# Patient Record
Sex: Female | Born: 1945 | Race: White | Hispanic: No | Smoking: Never smoker
Health system: Southern US, Community
[De-identification: ages and names within clinical notes are randomized; demographics above are authoritative.]

## PROBLEM LIST (undated history)

## (undated) DIAGNOSIS — G2581 Restless legs syndrome: Secondary | ICD-10-CM

## (undated) DIAGNOSIS — E119 Type 2 diabetes mellitus without complications: Secondary | ICD-10-CM

## (undated) DIAGNOSIS — I639 Cerebral infarction, unspecified: Secondary | ICD-10-CM

## (undated) DIAGNOSIS — R011 Cardiac murmur, unspecified: Secondary | ICD-10-CM

## (undated) DIAGNOSIS — D649 Anemia, unspecified: Secondary | ICD-10-CM

## (undated) DIAGNOSIS — I739 Peripheral vascular disease, unspecified: Secondary | ICD-10-CM

## (undated) DIAGNOSIS — I10 Essential (primary) hypertension: Secondary | ICD-10-CM

## (undated) DIAGNOSIS — IMO0001 Reserved for inherently not codable concepts without codable children: Secondary | ICD-10-CM

## (undated) DIAGNOSIS — F410 Panic disorder [episodic paroxysmal anxiety] without agoraphobia: Secondary | ICD-10-CM

## (undated) DIAGNOSIS — I509 Heart failure, unspecified: Secondary | ICD-10-CM

## (undated) DIAGNOSIS — J45909 Unspecified asthma, uncomplicated: Secondary | ICD-10-CM

## (undated) DIAGNOSIS — K219 Gastro-esophageal reflux disease without esophagitis: Secondary | ICD-10-CM

## (undated) DIAGNOSIS — Z8719 Personal history of other diseases of the digestive system: Secondary | ICD-10-CM

## (undated) HISTORY — DX: Cerebral infarction, unspecified: I63.9

## (undated) HISTORY — PX: FRACTURE SURGERY: SHX138

## (undated) HISTORY — PX: ABDOMINAL HYSTERECTOMY: SHX81

## (undated) HISTORY — PX: BREAST BIOPSY: SHX20

## (undated) HISTORY — PX: EYE SURGERY: SHX253

## (undated) HISTORY — PX: TONSILLECTOMY: SUR1361

## (undated) HISTORY — DX: Heart failure, unspecified: I50.9

---

## 2005-02-25 ENCOUNTER — Ambulatory Visit: Payer: Self-pay | Admitting: Internal Medicine

## 2005-08-10 ENCOUNTER — Ambulatory Visit: Payer: Self-pay

## 2007-04-12 ENCOUNTER — Ambulatory Visit: Payer: Self-pay | Admitting: Internal Medicine

## 2008-02-06 ENCOUNTER — Ambulatory Visit: Payer: Self-pay | Admitting: Internal Medicine

## 2008-08-04 ENCOUNTER — Ambulatory Visit: Payer: Self-pay | Admitting: Internal Medicine

## 2012-02-24 ENCOUNTER — Ambulatory Visit: Payer: Self-pay | Admitting: Internal Medicine

## 2012-05-16 ENCOUNTER — Ambulatory Visit: Payer: Self-pay | Admitting: Internal Medicine

## 2012-07-02 ENCOUNTER — Ambulatory Visit: Payer: Self-pay | Admitting: Internal Medicine

## 2012-10-16 ENCOUNTER — Ambulatory Visit: Payer: Self-pay

## 2012-10-24 ENCOUNTER — Ambulatory Visit: Payer: Self-pay | Admitting: Ophthalmology

## 2012-10-24 DIAGNOSIS — I10 Essential (primary) hypertension: Secondary | ICD-10-CM

## 2012-11-12 ENCOUNTER — Ambulatory Visit: Payer: Self-pay | Admitting: Ophthalmology

## 2013-08-22 ENCOUNTER — Emergency Department: Payer: Self-pay | Admitting: Internal Medicine

## 2013-08-29 ENCOUNTER — Ambulatory Visit: Payer: Self-pay | Admitting: Family Medicine

## 2013-10-28 ENCOUNTER — Ambulatory Visit: Payer: Self-pay | Admitting: Internal Medicine

## 2013-12-19 LAB — BASIC METABOLIC PANEL
Anion Gap: 9 (ref 7–16)
BUN: 9 mg/dL (ref 7–18)
CHLORIDE: 101 mmol/L (ref 98–107)
Calcium, Total: 8.6 mg/dL (ref 8.5–10.1)
Co2: 29 mmol/L (ref 21–32)
Creatinine: 0.73 mg/dL (ref 0.60–1.30)
Glucose: 198 mg/dL — ABNORMAL HIGH (ref 65–99)
Potassium: 4.1 mmol/L (ref 3.5–5.1)
Sodium: 139 mmol/L (ref 136–145)

## 2013-12-19 LAB — CBC
HCT: 40.6 % (ref 35.0–47.0)
HGB: 12.5 g/dL (ref 12.0–16.0)
MCH: 26.2 pg (ref 26.0–34.0)
MCHC: 30.9 g/dL — AB (ref 32.0–36.0)
MCV: 85 fL (ref 80–100)
PLATELETS: 205 10*3/uL (ref 150–440)
RBC: 4.78 10*6/uL (ref 3.80–5.20)
RDW: 15.4 % — AB (ref 11.5–14.5)
WBC: 9.9 10*3/uL (ref 3.6–11.0)

## 2013-12-19 LAB — TROPONIN I: Troponin-I: 0.04 ng/mL

## 2013-12-20 ENCOUNTER — Observation Stay: Payer: Self-pay | Admitting: Internal Medicine

## 2013-12-20 LAB — TROPONIN I
Troponin-I: 0.04 ng/mL
Troponin-I: 0.04 ng/mL

## 2013-12-20 LAB — LIPID PANEL
Cholesterol: 127 mg/dL (ref 0–200)
HDL Cholesterol: 48 mg/dL (ref 40–60)
Ldl Cholesterol, Calc: 59 mg/dL (ref 0–100)
Triglycerides: 101 mg/dL (ref 0–200)
VLDL Cholesterol, Calc: 20 mg/dL (ref 5–40)

## 2013-12-20 LAB — HEMOGLOBIN A1C: Hemoglobin A1C: 8.1 % — ABNORMAL HIGH (ref 4.2–6.3)

## 2013-12-20 LAB — CK-MB
CK-MB: 1.2 ng/mL (ref 0.5–3.6)
CK-MB: 1.2 ng/mL (ref 0.5–3.6)
CK-MB: 1.2 ng/mL (ref 0.5–3.6)

## 2013-12-20 LAB — TSH: Thyroid Stimulating Horm: 2.22 u[IU]/mL

## 2013-12-24 LAB — CULTURE, BLOOD (SINGLE)

## 2013-12-25 LAB — CULTURE, BLOOD (SINGLE)

## 2014-01-30 ENCOUNTER — Ambulatory Visit: Payer: Self-pay | Admitting: Vascular Surgery

## 2014-03-03 ENCOUNTER — Ambulatory Visit: Payer: Self-pay | Admitting: Vascular Surgery

## 2014-06-20 NOTE — Op Note (Signed)
PATIENT NAME:  [name], [name] MR#:  [id number] DATE OF BIRTH:  November 05, 1945  DATE OF PROCEDURE:  11/12/2012  PREOPERATIVE DIAGNOSIS: Cataract, left eye.   POSTOPERATIVE DIAGNOSIS: Cataract, left eye.   PROCEDURE PERFORMED: Extracapsular cataract extraction using phacoemulsification with placement of Alcon [id number], 17.5-diopter posterior chamber lens, serial number [id number].   SURGEON: Maylon Peppers. Tanisa Lagace, M.D.   ANESTHESIA: 4% lidocaine and 0.75% Marcaine in a 50-50 mixture with 10 units/mL of Hylenex added, given as a peribulbar.   ANESTHESIOLOGIST: Naomie Dean, MD  COMPLICATIONS: None.   ESTIMATED BLOOD LOSS: Less than 1 mL.   DESCRIPTION OF PROCEDURE:  The patient was brought to the operating room and given a peribulbar block.  The patient was then prepped and draped in the usual fashion.  The vertical rectus muscles were imbricated using 5-0 silk sutures.  These sutures were then clamped to the sterile drapes as bridle sutures.  A limbal peritomy was performed extending two clock hours and hemostasis was obtained with cautery.  A partial thickness scleral groove was made at the surgical limbus and dissected anteriorly in a lamellar dissection using an Alcon crescent knife.  The anterior chamber was entered supero-temporally with a Superblade and through the lamellar dissection with a 2.6 mm keratome.  DisCoVisc was used to replace the aqueous and a continuous tear capsulorrhexis was carried out.  Hydrodissection and hydrodelineation were carried out with balanced salt and a 27 gauge canula.  The nucleus was rotated to confirm the effectiveness of the hydrodissection.  Phacoemulsification was carried out using a divide-and-conquer technique.  Total ultrasound time was 1 minute and 43 seconds with an average power of 25.7%, CDE 47.84.   Irrigation/aspiration was used to remove the residual cortex.  DisCoVisc was used to inflate the capsule and the internal incision was enlarged  to 3 mm with the crescent knife.  The intraocular lens was folded and inserted into the capsular bag using the AcrySert delivery system.  Irrigation/aspiration was used to remove the residual DisCoVisc.  Miostat was injected into the anterior chamber through the paracentesis track to inflate the anterior chamber and induce miosis.  0.1 mL of cefuroxime was injected via the paracentesis track containing 1 mg of medication. The wound was checked for leaks and none were found. The conjunctiva was closed with cautery and the bridle sutures were removed.  Two drops of 0.3% Vigamox were placed on the eye.   An eye shield was placed on the eye.  The patient was discharged to the recovery room in good condition.   ____________________________ Maylon Peppers Jensen Kilburg, MD sad:jm D: 11/12/2012 13:13:04 ET T: 11/12/2012 13:25:01 ET JOB#: [id number]  cc: Viviann Spare A. Vickey Boak, MD, <Dictator> Erline Levine MD ELECTRONICALLY SIGNED 11/19/2012 13:30

## 2014-06-21 NOTE — Consult Note (Signed)
PATIENT NAME:  [name], [name] MR#:  [id number] DATE OF BIRTH:  [birth year]  DATE OF CONSULTATION:  12/20/2013  REFERRING PHYSICIAN: Kelton Pillar. Sheryle Hail, MD    CONSULTING PHYSICIAN:  Lamar Blinks, MD  REASON FOR CONSULTATION: Chest pain with angina, essential hypertension, mixed hyperlipidemia, diabetes without complication.   CHIEF COMPLAINT: "I have shortness of breath."  HISTORY OF PRESENT ILLNESS: This is a 69 year old female with known diabetes without complication, essential hypertension and mixed hyperlipidemia, on appropriate medication management, who has had progressive shortness of breath over the last 3 months, worsening in the last several weeks and also associated with chest pain, substernal in nature, radiating into the back and associated with weakness and fatigue. The patient does have some lower extremity edema as well. The patient does have an EKG showing normal sinus rhythm with normal troponin without evidence of myocardial infarction but has other concerns, though currently she has been appropriately using medications given from the Emergency Room without further evidence of chest discomfort.   REVIEW OF SYSTEMS: The remainder of review of systems is negative for vision change, ringing in the ears, hearing loss, cough, congestion, heartburn, nausea, vomiting, diarrhea, bloody stool, stomach pain, extremity pain, leg weakness, cramping of the buttocks, known blood clots, headaches, blackouts, dizzy spells, nosebleeds, congestion, trouble swallowing, frequent urination, urination at night, muscle weakness, numbness, anxiety, depression, skin lesions, skin rashes.   PAST MEDICAL HISTORY:  1. Essential hypertension.  2. Diabetes without complication. 3. Mixed hyperlipidemia.   FAMILY HISTORY: No family members with early onset of cardiovascular disease, hypertension.   SOCIAL HISTORY: The patient has no current evidence of tobacco use or alcohol use.  ALLERGIES: She has  no known drug allergies.   CURRENT MEDICATIONS: As listed.   PHYSICAL EXAMINATION:  VITAL SIGNS: Blood pressure is 136/62 bilaterally. Heart rate is 72 upright, reclining, and regular.  GENERAL: She is a well-appearing female in no acute distress.  HEENT: No icterus, thyromegaly, ulcers, hemorrhage, or xanthelasma.  CARDIOVASCULAR: Regular rate and rhythm. Normal S1 and S2. Distant heart sounds. Cannot assess murmur, gallop, or rub. PMI is diffuse. Carotid upstroke normal without bruit. Jugular venous pressure is normal.  LUNGS: Clear to auscultation with normal respirations.  ABDOMEN: Soft, nontender. Cannot assess hepatosplenomegaly due to increased abdominal girth.  EXTREMITIES: Show 2+ radial, no dorsal pedal or femoral artery pulses with 1+ lower extremity edema and some redness and rash.  NEUROLOGIC: She is oriented to time, place, and person with normal mood and affect.   ASSESSMENT: A 69 year old female with essential hypertension, diabetes without complication, mixed hyperlipidemia with unstable angina and no current evidence of myocardial infarction.   RECOMMENDATIONS:  1. Continue serial ECG and enzymes to assess for possible myocardial infarction.  2. Continue treatment of essential hypertension with ACE inhibitor and consider beta blocker as well.  3. Mixed hyperlipidemia treatment with high intensity cholesterol therapy due to diabetes and high risk score.  4. Diabetes medication management for goal hemoglobin A1c. 5. Echocardiogram for LV systolic dysfunction causing shortness of breath and/or chest discomfort..  6. Further intervention including stress test for myocardial perfusion imaging and causes for unstable angina.    ____________________________ Lamar Blinks, MD bjk:jh D: 12/20/2013 08:25:30 ET T: 12/20/2013 09:47:39 ET JOB#: [id number]  cc: Lamar Blinks, MD, <Dictator> Lamar Blinks MD ELECTRONICALLY SIGNED 12/24/2013 8:41

## 2014-06-21 NOTE — H&P (Signed)
PATIENT NAME:  [name], [name] MR#:  [id number] DATE OF BIRTH:  August 31, 1945  DATE OF ADMISSION:  12/20/2013  REFERRING PHYSICIAN:  Bobetta Lime A. Inocencio Homes, MD   PRIMARY CARE PHYSICIAN:  John B. Danne Harbor, MD  ADMISSION DIAGNOSIS:  Chest pain.   HISTORY OF PRESENT ILLNESS:  This is a 69 year old Caucasian female who presents to the Emergency Department complaining of chest pain with exertion. The patient states that every time she walks, no matter the distance or incline or pace, she feels a dull pressure-like sensation just left of center of her chest. The pain does not radiate. She never feels the pain at rest. She occasionally feels nausea with the chest pain but always seems to feel hot and becomes slightly diaphoretic. She admits to shortness of breath with her chest pain as well as lightheadedness. Due to her risk factors and symptomatology, the Emergency Department called for admission.   REVIEW OF SYSTEMS:  CONSTITUTIONAL:  The patient denies fever or weakness.  EYES:  Denies inflammation or blurred vision.  EARS, NOSE, AND THROAT:  Denies tinnitus or difficulty swallowing.  RESPIRATORY:  Admits to shortness of breath associated with chest pain but denies cough.  CARDIOVASCULAR:  Admits to chest pain, as well as orthopnea. She denies palpitations or paroxysmal nocturnal dyspnea.  GASTROINTESTINAL:  Admits to some nausea associated with chest pain but denies vomiting or diarrhea.  GENITOURINARY:  The patient denies dysuria or increased frequency or hesitancy.  ENDOCRINE:  Denies polyuria or nocturia.  HEMATOLOGIC AND LYMPHATIC:  Denies easy bruising or bleeding.  INTEGUMENT:  Admits to rash but denies lesions.  MUSCULOSKELETAL:  Denies myalgias but admits to hip pain.  NEUROLOGIC:  Denies numbness in her extremities or dysarthria.  PSYCHIATRIC:  Denies depression or suicidal ideation.   PAST MEDICAL HISTORY:  Hypertension, diabetes type 2, hyperlipidemia, gastroesophageal reflux disease,  hiatal hernia, as well as a history of thyroiditis and thyroid nodules.   PAST SURGICAL HISTORY:  The patient had percutaneous intervention but did not receive stents more than 10 years ago. She has also had a hysterectomy.   FAMILY HISTORY:  Heart disease in her father and brother, both of whom are deceased of this condition.   SOCIAL HISTORY:  The patient does not drink or do any drugs, but she admits to being exposed to secondhand smoke for the last 45 years.   MEDICATIONS: 1.  Glimepiride 2 mg 1 tab p.o. daily.  2.  Levemir 45 units subcutaneously 2 times a day.  3.  Lovastatin 20 mg 1 tab p.o. daily.  4.  Metformin 1000 mg 1 tab p.o. b.i.d.  5.  Omeprazole 20 mg extended-release capsule 1 cap p.o. daily.  6.  Trazodone 100 mg 1 tab p.o. daily.   ALLERGIES:  CODEINE AND WHEAT.   PERTINENT LABORATORY RESULTS AND RADIOGRAPHIC FINDINGS:  Serum glucose is 198, BUN 9, creatinine 0.73, sodium 139, potassium 4.1, chloride 101, bicarbonate 29, and calcium 8.6. Troponin is 0.04. White blood cell count is 9.9, hemoglobin 12.5, hematocrit 40.6, MCV 85.   Chest x-ray shows shallow inspiration with mild cardiac enlargement, but no evidence of active pulmonary disease.   PHYSICAL EXAMINATION: VITAL SIGNS:  Temperature is 97.4, pulse 90, respirations 20, blood pressure 146/78, pulse oximetry 93% on 2 liters of oxygen via nasal cannula.  GENERAL:  The patient is alert and oriented x 3 and in no apparent distress.  HEENT:  Normocephalic. Pupils are equal, round, and reactive to light and accommodation. Extraocular movements are  intact. Mucous membranes are moist. The patient points to a depression on the crown of her scalp that she suffered following a fall a number of weeks ago.  NECK:  Trachea is midline. No adenopathy.  CHEST:  Symmetric and atraumatic.  CARDIOVASCULAR:  Regular rate and rhythm. Normal S1, S2. No rubs, clicks, or murmurs. The patient does have a carotid bruit on the left.  LUNGS:   Clear to auscultation bilaterally. Normal effort and excursion.  ABDOMEN:  Positive bowel sounds. Soft, nontender, nondistended. No hepatosplenomegaly.  GENITOURINARY:  Deferred.  MUSCULOSKELETAL:  The patient moves all 4 extremities equally. Strength is 5/5 in upper and lower extremities bilaterally.  SKIN:  The patient has multiple areas of excoriations on her lower extremities. She has been diagnosed with scabies recently.  EXTREMITIES:  No clubbing or cyanosis. The patient does have some nonpitting and nontense edema up to her mid shin bilaterally.  NEUROLOGIC:  Cranial nerves II through XII are grossly intact.  PSYCHIATRIC:  Mood is normal. Affect is congruent.   ASSESSMENT AND PLAN:  This is a 69 year old female admitted for chest pain.   1.  Pain is exertional and associated with shortness of breath. It has been persisting over weeks, which certainly indicates that she has chronic stable angina. Thankfully, she does not have any acute EKG changes. Her first troponin is negative. She was given therapeutic Lovenox in the Emergency Department, but we will continue at this time with prophylactic subcutaneous heparin. We will continue to cycle the patient's cardiac biomarkers. I have consulted cardiology for the morning.  2.  Hypertension is uncontrolled at this time. It does not appear the patient is on antihypertensive medicine. I will initiate an ACE inhibitor at this time, as her kidney function is normal and she does not have a listed allergy to this class of medicine.  3.  Diabetes type 2. We will continue the patient's basal insulin. I have decreased the dose some given that it is dosed twice daily at home. I have held her glimepiride and metformin in the event that she has a procedure that requires contrast dye.  4.  Hyperlipidemia. We will continue the patient's statin per her home regimen.  5.  Obesity. The patient's BMI is 49. I have encouraged a balanced diet and portion control.  6.   Thyroiditis, history of thyroid nodules, and the patient reports that she had been on hormone replacement at one time in the past. We will check a thyroid-stimulating hormone with her labs.  7.  Scabies. The patient has permethrin cream. We will apply it tonight and rinse it in the morning. Due to the multiple areas of excoriation, the Emergency Department obtained blood cultures and gave 1 dose of clindamycin. It does not appear that she has super infection or impetigo of the wounds at this time. She also has no signs or symptoms of sepsis.  8.  Gastroesophageal reflux disease secondary to hiatal hernia. We will continue the patient's proton pump inhibitor.  9.  Deep vein thrombosis prophylaxis with heparin.  10.  Gastrointestinal prophylaxis with pantoprazole.   CODE STATUS:  The patient is a full code.   TIME SPENT ON ADMISSION ORDERS AND PATIENT CARE:  Approximately 45 minutes.    ____________________________ Kelton Pillar. Sheryle Hail, MD msd:nb D: 12/20/2013 02:00:57 ET T: 12/20/2013 02:49:21 ET JOB#: [id number]  cc: Kelton Pillar. Sheryle Hail, MD, <Dictator> Kelton Pillar Cheralyn Oliver MD ELECTRONICALLY SIGNED 12/20/2013 7:40

## 2014-06-25 ENCOUNTER — Other Ambulatory Visit: Payer: Self-pay | Admitting: Internal Medicine

## 2014-06-25 DIAGNOSIS — Z1231 Encounter for screening mammogram for malignant neoplasm of breast: Secondary | ICD-10-CM

## 2014-07-17 ENCOUNTER — Ambulatory Visit
Admission: RE | Admit: 2014-07-17 | Discharge: 2014-07-17 | Disposition: A | Discharge status: Home / Self Care | Payer: Medicare Other | Source: Ambulatory Visit | Attending: Vascular Surgery | Admitting: Vascular Surgery

## 2014-07-17 ENCOUNTER — Encounter
Admission: RE | Admit: 2014-07-17 | Discharge: 2014-07-17 | Disposition: A | Discharge status: Home / Self Care | Payer: Medicare Other | Source: Ambulatory Visit | Attending: Vascular Surgery | Admitting: Vascular Surgery

## 2014-07-17 DIAGNOSIS — R0600 Dyspnea, unspecified: Secondary | ICD-10-CM | POA: Diagnosis not present

## 2014-07-17 DIAGNOSIS — Z01818 Encounter for other preprocedural examination: Secondary | ICD-10-CM | POA: Insufficient documentation

## 2014-07-17 DIAGNOSIS — I10 Essential (primary) hypertension: Secondary | ICD-10-CM | POA: Insufficient documentation

## 2014-07-17 DIAGNOSIS — Z01812 Encounter for preprocedural laboratory examination: Secondary | ICD-10-CM | POA: Insufficient documentation

## 2014-07-17 DIAGNOSIS — Z01810 Encounter for preprocedural cardiovascular examination: Secondary | ICD-10-CM | POA: Insufficient documentation

## 2014-07-17 HISTORY — DX: Reserved for inherently not codable concepts without codable children: IMO0001

## 2014-07-17 HISTORY — DX: Cardiac murmur, unspecified: R01.1

## 2014-07-17 HISTORY — DX: Personal history of other diseases of the digestive system: Z87.19

## 2014-07-17 HISTORY — DX: Unspecified asthma, uncomplicated: J45.909

## 2014-07-17 HISTORY — DX: Restless legs syndrome: G25.81

## 2014-07-17 HISTORY — DX: Type 2 diabetes mellitus without complications: E11.9

## 2014-07-17 HISTORY — DX: Gastro-esophageal reflux disease without esophagitis: K21.9

## 2014-07-17 HISTORY — DX: Peripheral vascular disease, unspecified: I73.9

## 2014-07-17 HISTORY — DX: Panic disorder (episodic paroxysmal anxiety): F41.0

## 2014-07-17 HISTORY — DX: Essential (primary) hypertension: I10

## 2014-07-17 LAB — CBC WITH DIFFERENTIAL/PLATELET
BASOS PCT: 1 %
Basophils Absolute: 0.1 10*3/uL (ref 0–0.1)
Eosinophils Absolute: 0.3 10*3/uL (ref 0–0.7)
Eosinophils Relative: 3 %
HCT: 39.8 % (ref 35.0–47.0)
Hemoglobin: 12.4 g/dL (ref 12.0–16.0)
LYMPHS ABS: 1.9 10*3/uL (ref 1.0–3.6)
Lymphocytes Relative: 18 %
MCH: 25.6 pg — ABNORMAL LOW (ref 26.0–34.0)
MCHC: 31.2 g/dL — AB (ref 32.0–36.0)
MCV: 82 fL (ref 80.0–100.0)
MONOS PCT: 7 %
Monocytes Absolute: 0.8 10*3/uL (ref 0.2–0.9)
Neutro Abs: 7.4 10*3/uL — ABNORMAL HIGH (ref 1.4–6.5)
Neutrophils Relative %: 71 %
Platelets: 214 10*3/uL (ref 150–440)
RBC: 4.85 MIL/uL (ref 3.80–5.20)
RDW: 16.9 % — ABNORMAL HIGH (ref 11.5–14.5)
WBC: 10.5 10*3/uL (ref 3.6–11.0)

## 2014-07-17 LAB — URINALYSIS COMPLETE WITH MICROSCOPIC (ARMC ONLY)
BACTERIA UA: NONE SEEN
BILIRUBIN URINE: NEGATIVE
GLUCOSE, UA: NEGATIVE mg/dL
Ketones, ur: NEGATIVE mg/dL
Nitrite: NEGATIVE
Protein, ur: 30 mg/dL — AB
SPECIFIC GRAVITY, URINE: 1.012 (ref 1.005–1.030)
pH: 6 (ref 5.0–8.0)

## 2014-07-17 LAB — BASIC METABOLIC PANEL
Anion gap: 11 (ref 5–15)
BUN: 8 mg/dL (ref 6–20)
CALCIUM: 8.7 mg/dL — AB (ref 8.9–10.3)
CHLORIDE: 98 mmol/L — AB (ref 101–111)
CO2: 29 mmol/L (ref 22–32)
Creatinine, Ser: 0.7 mg/dL (ref 0.44–1.00)
GFR calc Af Amer: >60 mL/min (ref >60)
GFR calc non Af Amer: >60 mL/min (ref >60)
Glucose, Bld: 236 mg/dL — ABNORMAL HIGH (ref 65–99)
Potassium: 3.8 mmol/L (ref 3.5–5.1)
Sodium: 138 mmol/L (ref 135–145)

## 2014-07-17 LAB — TYPE AND SCREEN
ABO/RH(D): O POS
Antibody Screen: NEGATIVE

## 2014-07-17 LAB — PROTIME-INR
INR: 1.03
Prothrombin Time: 13.7 seconds (ref 11.4–15.0)

## 2014-07-17 LAB — APTT: APTT: 31 s (ref 24–36)

## 2014-07-18 LAB — ABO/RH: ABO/RH(D): O POS

## 2014-07-24 ENCOUNTER — Inpatient Hospital Stay
Admission: RE | Admit: 2014-07-24 | Discharge: 2014-07-25 | DRG: 038 | Disposition: A | Discharge status: Home / Self Care | Payer: Medicare Other | Source: Ambulatory Visit | Attending: Vascular Surgery | Admitting: Vascular Surgery

## 2014-07-24 ENCOUNTER — Inpatient Hospital Stay: Payer: Medicare Other | Admitting: Anesthesiology

## 2014-07-24 ENCOUNTER — Encounter: Payer: Self-pay | Admitting: *Deleted

## 2014-07-24 ENCOUNTER — Encounter
Admission: RE | Disposition: A | Discharge status: Home / Self Care | Payer: Self-pay | Source: Ambulatory Visit | Attending: Vascular Surgery

## 2014-07-24 DIAGNOSIS — G2581 Restless legs syndrome: Secondary | ICD-10-CM | POA: Diagnosis present

## 2014-07-24 DIAGNOSIS — K219 Gastro-esophageal reflux disease without esophagitis: Secondary | ICD-10-CM | POA: Diagnosis present

## 2014-07-24 DIAGNOSIS — E1140 Type 2 diabetes mellitus with diabetic neuropathy, unspecified: Secondary | ICD-10-CM | POA: Diagnosis present

## 2014-07-24 DIAGNOSIS — I10 Essential (primary) hypertension: Secondary | ICD-10-CM | POA: Diagnosis present

## 2014-07-24 DIAGNOSIS — Z6841 Body Mass Index (BMI) 40.0 and over, adult: Secondary | ICD-10-CM | POA: Diagnosis not present

## 2014-07-24 DIAGNOSIS — I6521 Occlusion and stenosis of right carotid artery: Principal | ICD-10-CM | POA: Diagnosis present

## 2014-07-24 DIAGNOSIS — J45909 Unspecified asthma, uncomplicated: Secondary | ICD-10-CM | POA: Diagnosis present

## 2014-07-24 DIAGNOSIS — Z7982 Long term (current) use of aspirin: Secondary | ICD-10-CM | POA: Diagnosis not present

## 2014-07-24 DIAGNOSIS — F410 Panic disorder [episodic paroxysmal anxiety] without agoraphobia: Secondary | ICD-10-CM | POA: Diagnosis present

## 2014-07-24 DIAGNOSIS — I739 Peripheral vascular disease, unspecified: Secondary | ICD-10-CM | POA: Diagnosis present

## 2014-07-24 DIAGNOSIS — Z794 Long term (current) use of insulin: Secondary | ICD-10-CM

## 2014-07-24 DIAGNOSIS — Z79891 Long term (current) use of opiate analgesic: Secondary | ICD-10-CM | POA: Diagnosis not present

## 2014-07-24 DIAGNOSIS — E780 Pure hypercholesterolemia: Secondary | ICD-10-CM | POA: Diagnosis present

## 2014-07-24 DIAGNOSIS — I6529 Occlusion and stenosis of unspecified carotid artery: Secondary | ICD-10-CM | POA: Diagnosis present

## 2014-07-24 HISTORY — PX: ENDARTERECTOMY: SHX5162

## 2014-07-24 LAB — GLUCOSE, CAPILLARY
GLUCOSE-CAPILLARY: 222 mg/dL — AB (ref 65–99)
GLUCOSE-CAPILLARY: 202 mg/dL — AB (ref 65–99)
GLUCOSE-CAPILLARY: 229 mg/dL — AB (ref 65–99)
Glucose-Capillary: 220 mg/dL — ABNORMAL HIGH (ref 65–99)

## 2014-07-24 SURGERY — ENDARTERECTOMY, CAROTID
Anesthesia: General | Laterality: Right | Wound class: Clean

## 2014-07-24 MED ORDER — OXYCODONE-ACETAMINOPHEN 5-325 MG PO TABS: 1.0000 | ORAL_TABLET | ORAL | Status: DC | PRN

## 2014-07-24 MED ORDER — FAMOTIDINE IN NACL 20-0.9 MG/50ML-% IV SOLN
20.0000 mg | Freq: Two times a day (BID) | INTRAVENOUS | Status: DC
  Administered 2014-07-24: 20 mg via INTRAVENOUS
  Filled 2014-07-24 (×4): qty 50

## 2014-07-24 MED ORDER — PROPOFOL 10 MG/ML IV BOLUS
INTRAVENOUS | Status: DC | PRN
  Administered 2014-07-24: 140 mg via INTRAVENOUS

## 2014-07-24 MED ORDER — NITROGLYCERIN IN D5W 200-5 MCG/ML-% IV SOLN
INTRAVENOUS | Status: AC
  Filled 2014-07-24: qty 250

## 2014-07-24 MED ORDER — SODIUM CHLORIDE 0.9 % IV SOLN
INTRAVENOUS | Status: DC | PRN
  Administered 2014-07-24: 50 mL via INTRAMUSCULAR

## 2014-07-24 MED ORDER — LABETALOL HCL 5 MG/ML IV SOLN: 10.0000 mg | INTRAVENOUS | Status: DC | PRN

## 2014-07-24 MED ORDER — LABETALOL HCL 5 MG/ML IV SOLN
INTRAVENOUS | Status: DC | PRN
  Administered 2014-07-24: 2.5 mg via INTRAVENOUS
  Administered 2014-07-24: 5 mg via INTRAVENOUS
  Administered 2014-07-24: 2.5 mg via INTRAVENOUS

## 2014-07-24 MED ORDER — CEFAZOLIN SODIUM-DEXTROSE 2-3 GM-% IV SOLR
INTRAVENOUS | Status: AC
  Filled 2014-07-24: qty 50

## 2014-07-24 MED ORDER — DOCUSATE SODIUM 100 MG PO CAPS
100.0000 mg | ORAL_CAPSULE | Freq: Every day | ORAL | Status: DC
  Administered 2014-07-25: 100 mg via ORAL
  Filled 2014-07-24: qty 1

## 2014-07-24 MED ORDER — NEOSTIGMINE METHYLSULFATE 10 MG/10ML IV SOLN
INTRAVENOUS | Status: DC | PRN
  Administered 2014-07-24: 4 mg via INTRAVENOUS

## 2014-07-24 MED ORDER — GLYCOPYRROLATE 0.2 MG/ML IJ SOLN
INTRAMUSCULAR | Status: DC | PRN
  Administered 2014-07-24: 0.6 mg via INTRAVENOUS

## 2014-07-24 MED ORDER — MORPHINE SULFATE 2 MG/ML IJ SOLN: 2.0000 mg | INTRAMUSCULAR | Status: DC | PRN

## 2014-07-24 MED ORDER — DEXTROSE 5 % IV SOLN
10.0000 mg | INTRAVENOUS | Status: DC | PRN
  Administered 2014-07-24: 50 ug/min via INTRAVENOUS

## 2014-07-24 MED ORDER — ACETAMINOPHEN 325 MG PO TABS
325.0000 mg | ORAL_TABLET | ORAL | Status: DC | PRN
  Administered 2014-07-24 – 2014-07-25 (×2): 325 mg via ORAL
  Filled 2014-07-24 (×2): qty 1

## 2014-07-24 MED ORDER — EVICEL 2 ML EX KIT
PACK | CUTANEOUS | Status: DC | PRN
  Administered 2014-07-24: 1

## 2014-07-24 MED ORDER — LIDOCAINE HCL (PF) 1 % IJ SOLN
INTRAMUSCULAR | Status: AC
  Filled 2014-07-24: qty 30

## 2014-07-24 MED ORDER — CEFAZOLIN SODIUM 1 G IJ SOLR
INTRAMUSCULAR | Status: AC
  Filled 2014-07-24: qty 10

## 2014-07-24 MED ORDER — HYDRALAZINE HCL 20 MG/ML IJ SOLN: 5.0000 mg | INTRAMUSCULAR | Status: DC | PRN

## 2014-07-24 MED ORDER — HEPARIN SODIUM (PORCINE) 1000 UNIT/ML IJ SOLN
INTRAMUSCULAR | Status: DC | PRN
  Administered 2014-07-24: 6 mL via INTRAVENOUS

## 2014-07-24 MED ORDER — ONDANSETRON HCL 4 MG/2ML IJ SOLN: 4.0000 mg | Freq: Four times a day (QID) | INTRAMUSCULAR | Status: DC | PRN

## 2014-07-24 MED ORDER — ROCURONIUM BROMIDE 100 MG/10ML IV SOLN
INTRAVENOUS | Status: DC | PRN
  Administered 2014-07-24: 20 mg via INTRAVENOUS
  Administered 2014-07-24: 30 mg via INTRAVENOUS
  Administered 2014-07-24: 20 mg via INTRAVENOUS

## 2014-07-24 MED ORDER — FENTANYL CITRATE (PF) 100 MCG/2ML IJ SOLN: 25.0000 ug | INTRAMUSCULAR | Status: DC | PRN

## 2014-07-24 MED ORDER — ACETAMINOPHEN 650 MG RE SUPP: 325.0000 mg | RECTAL | Status: DC | PRN

## 2014-07-24 MED ORDER — CEFAZOLIN SODIUM-DEXTROSE 2-3 GM-% IV SOLR
2.0000 g | Freq: Once | INTRAVENOUS | Status: AC
  Administered 2014-07-24: 2 g via INTRAVENOUS

## 2014-07-24 MED ORDER — ONDANSETRON HCL 4 MG/2ML IJ SOLN: 4.0000 mg | Freq: Once | INTRAMUSCULAR | Status: DC | PRN

## 2014-07-24 MED ORDER — HEPARIN SODIUM (PORCINE) 1000 UNIT/ML IJ SOLN
INTRAMUSCULAR | Status: AC
  Filled 2014-07-24: qty 1

## 2014-07-24 MED ORDER — MAGNESIUM SULFATE 2 GM/50ML IV SOLN
2.0000 g | Freq: Every day | INTRAVENOUS | Status: DC | PRN
  Filled 2014-07-24: qty 50

## 2014-07-24 MED ORDER — LIDOCAINE HCL 1 % IJ SOLN
INTRAMUSCULAR | Status: DC | PRN
  Administered 2014-07-24: 10 mL

## 2014-07-24 MED ORDER — GUAIFENESIN-DM 100-10 MG/5ML PO SYRP: 15.0000 mL | ORAL_SOLUTION | ORAL | Status: DC | PRN

## 2014-07-24 MED ORDER — FENTANYL CITRATE (PF) 100 MCG/2ML IJ SOLN
25.0000 ug | INTRAMUSCULAR | Status: DC | PRN
  Administered 2014-07-24: 25 ug via INTRAVENOUS

## 2014-07-24 MED ORDER — SODIUM CHLORIDE 0.9 % IV SOLN: 500.0000 mL | Freq: Once | INTRAVENOUS | Status: AC | PRN

## 2014-07-24 MED ORDER — ALUM & MAG HYDROXIDE-SIMETH 200-200-20 MG/5ML PO SUSP: 15.0000 mL | ORAL | Status: DC | PRN

## 2014-07-24 MED ORDER — SODIUM CHLORIDE 0.9 % IV SOLN
INTRAVENOUS | Status: DC
  Administered 2014-07-24 (×2): via INTRAVENOUS

## 2014-07-24 MED ORDER — ESMOLOL HCL-SODIUM CHLORIDE 2500 MG/250ML IV SOLN
25.0000 ug/kg/min | INTRAVENOUS | Status: DC
  Filled 2014-07-24: qty 250
  Filled 2014-07-24: qty 100

## 2014-07-24 MED ORDER — CLOPIDOGREL BISULFATE 75 MG PO TABS
75.0000 mg | ORAL_TABLET | Freq: Every day | ORAL | Status: DC
  Administered 2014-07-25: 75 mg via ORAL
  Filled 2014-07-24: qty 1

## 2014-07-24 MED ORDER — SODIUM CHLORIDE 0.9 % IV SOLN
INTRAVENOUS | Status: DC | PRN
  Administered 2014-07-24: 180 mL

## 2014-07-24 MED ORDER — ASPIRIN EC 81 MG PO TBEC
81.0000 mg | DELAYED_RELEASE_TABLET | Freq: Every day | ORAL | Status: DC
  Administered 2014-07-24 – 2014-07-25 (×2): 81 mg via ORAL
  Filled 2014-07-24 (×2): qty 1

## 2014-07-24 MED ORDER — MIDAZOLAM HCL 2 MG/2ML IJ SOLN
INTRAMUSCULAR | Status: DC | PRN
  Administered 2014-07-24: 2 mg via INTRAVENOUS

## 2014-07-24 MED ORDER — DEXTROSE 5 % IV SOLN
1.5000 g | Freq: Two times a day (BID) | INTRAVENOUS | Status: AC
  Administered 2014-07-24 – 2014-07-25 (×2): 1.5 g via INTRAVENOUS
  Filled 2014-07-24 (×2): qty 1.5

## 2014-07-24 MED ORDER — ONDANSETRON HCL 4 MG/2ML IJ SOLN
INTRAMUSCULAR | Status: DC | PRN
  Administered 2014-07-24: 4 mg via INTRAVENOUS

## 2014-07-24 MED ORDER — METOPROLOL TARTRATE 1 MG/ML IV SOLN: 2.0000 mg | INTRAVENOUS | Status: DC | PRN

## 2014-07-24 MED ORDER — SUGAMMADEX SODIUM 200 MG/2ML IV SOLN
INTRAVENOUS | Status: DC | PRN
  Administered 2014-07-24: 200 mg via INTRAVENOUS

## 2014-07-24 MED ORDER — SODIUM CHLORIDE 0.9 % IV SOLN
INTRAVENOUS | Status: DC
  Administered 2014-07-24: 11:00:00 via INTRAVENOUS

## 2014-07-24 MED ORDER — PHENOL 1.4 % MT LIQD
1.0000 | OROMUCOSAL | Status: DC | PRN
  Filled 2014-07-24: qty 177

## 2014-07-24 MED ORDER — FENTANYL CITRATE (PF) 100 MCG/2ML IJ SOLN
INTRAMUSCULAR | Status: DC | PRN
  Administered 2014-07-24 (×2): 100 ug via INTRAVENOUS
  Administered 2014-07-24: 50 ug via INTRAVENOUS

## 2014-07-24 MED ORDER — POTASSIUM CHLORIDE CRYS ER 20 MEQ PO TBCR
20.0000 meq | EXTENDED_RELEASE_TABLET | Freq: Every day | ORAL | Status: DC | PRN
Start: 2014-07-24 — End: 2014-07-25

## 2014-07-24 MED ORDER — DOPAMINE-DEXTROSE 3.2-5 MG/ML-% IV SOLN: 3.0000 ug/kg/min | INTRAVENOUS | Status: DC

## 2014-07-24 MED ORDER — NITROGLYCERIN IN D5W 200-5 MCG/ML-% IV SOLN: 5.0000 ug/min | INTRAVENOUS | Status: DC

## 2014-07-24 MED ORDER — FENTANYL CITRATE (PF) 100 MCG/2ML IJ SOLN
INTRAMUSCULAR | Status: AC
  Filled 2014-07-24: qty 2

## 2014-07-24 SURGICAL SUPPLY — 58 items
BAG DECANTER STRL (MISCELLANEOUS) ×3 IMPLANT
BLADE SURG 15 STRL LF DISP TIS (BLADE) ×1 IMPLANT
BLADE SURG 15 STRL SS (BLADE) ×2
BLADE SURG SZ11 CARB STEEL (BLADE) ×3 IMPLANT
BOOT SUTURE AID YELLOW STND (SUTURE) ×3 IMPLANT
BRUSH SCRUB 4% CHG (MISCELLANEOUS) ×3 IMPLANT
CANISTER SUCT 1200ML W/VALVE (MISCELLANEOUS) ×3 IMPLANT
CATH TRAY 16F METER LATEX (MISCELLANEOUS) ×3 IMPLANT
DRAPE INCISE IOBAN 66X45 STRL (DRAPES) ×3 IMPLANT
DRAPE PED LAPAROTOMY (DRAPES) ×3 IMPLANT
DRAPE SHEET LG 3/4 BI-LAMINATE (DRAPES) IMPLANT
DRSG TEGADERM 4X4.75 (GAUZE/BANDAGES/DRESSINGS) IMPLANT
DRSG TELFA 3X8 NADH (GAUZE/BANDAGES/DRESSINGS) IMPLANT
DURAPREP 26ML APPLICATOR (WOUND CARE) ×3 IMPLANT
ELECT CAUTERY BLADE 6.4 (BLADE) ×3 IMPLANT
EVICEL 2ML SEALANT HUMAN (Miscellaneous) ×3 IMPLANT
GLOVE BIO SURGEON STRL SZ7 (GLOVE) ×12 IMPLANT
GOWN STRL REUS W/ TWL LRG LVL3 (GOWN DISPOSABLE) ×2 IMPLANT
GOWN STRL REUS W/ TWL XL LVL3 (GOWN DISPOSABLE) ×1 IMPLANT
GOWN STRL REUS W/TWL LRG LVL3 (GOWN DISPOSABLE) ×4
GOWN STRL REUS W/TWL XL LVL3 (GOWN DISPOSABLE) ×2
HEMOSTAT SURGICEL 2X3 (HEMOSTASIS) ×3 IMPLANT
IV NS 250ML (IV SOLUTION) ×2
IV NS 250ML BAXH (IV SOLUTION) ×1 IMPLANT
KIT RM TURNOVER STRD PROC AR (KITS) ×3 IMPLANT
LABEL OR SOLS (LABEL) ×3 IMPLANT
LIQUID BAND (GAUZE/BANDAGES/DRESSINGS) ×3 IMPLANT
LOOP RED MAXI  1X406MM (MISCELLANEOUS) ×4
LOOP VESSEL MAXI 1X406 RED (MISCELLANEOUS) ×2 IMPLANT
LOOP VESSEL MINI 0.8X406 BLUE (MISCELLANEOUS) ×1 IMPLANT
LOOPS BLUE MINI 0.8X406MM (MISCELLANEOUS) ×2
NDL SAFETY 25GX1.5 (NEEDLE) ×3 IMPLANT
NEEDLE FILTER BLUNT 18X 1/2SAF (NEEDLE) ×2
NEEDLE FILTER BLUNT 18X1 1/2 (NEEDLE) ×1 IMPLANT
NS IRRIG 1000ML POUR BTL (IV SOLUTION) ×3 IMPLANT
PACK BASIN MAJOR ARMC (MISCELLANEOUS) ×3 IMPLANT
PAD GROUND ADULT SPLIT (MISCELLANEOUS) ×3 IMPLANT
PATCH CAROTID ECM VASC 1X10 (Prosthesis & Implant Heart) ×3 IMPLANT
PENCIL ELECTRO HAND CTR (MISCELLANEOUS) IMPLANT
SHUNT CAROTID PRUITT F3 T3103A (SHUNT) ×3 IMPLANT
SUT MNCRL 4-0 (SUTURE) ×2
SUT MNCRL 4-0 27XMFL (SUTURE) ×1
SUT PROLENE 6 0 BV (SUTURE) ×18 IMPLANT
SUT PROLENE BV 1 BLUE 7-0 30IN (SUTURE) ×6 IMPLANT
SUT SILK 2 0 (SUTURE) ×2
SUT SILK 2-0 18XBRD TIE 12 (SUTURE) ×1 IMPLANT
SUT SILK 3 0 (SUTURE) ×2
SUT SILK 3-0 18XBRD TIE 12 (SUTURE) ×1 IMPLANT
SUT SILK 4 0 (SUTURE) ×2
SUT SILK 4-0 18XBRD TIE 12 (SUTURE) ×1 IMPLANT
SUT VIC AB 3-0 SH 27 (SUTURE) ×4
SUT VIC AB 3-0 SH 27X BRD (SUTURE) ×2 IMPLANT
SUTURE MNCRL 4-0 27XMF (SUTURE) ×1 IMPLANT
SYR 20CC LL (SYRINGE) ×3 IMPLANT
SYRINGE 10CC LL (SYRINGE) ×6 IMPLANT
TOWEL OR 17X26 4PK STRL BLUE (TOWEL DISPOSABLE) IMPLANT
TUBING CONNECTING 10 (TUBING) IMPLANT
TUBING CONNECTING 10' (TUBING)

## 2014-07-24 NOTE — Progress Notes (Signed)
This note also relates to the following rows which could not be included: BP - Cannot attach notes to unvalidated device data MAP (mmHg) - Cannot attach notes to unvalidated device data Pulse Rate - Cannot attach notes to unvalidated device data ECG Heart Rate - Cannot attach notes to unvalidated device data Resp - Cannot attach notes to unvalidated device data SpO2 - Cannot attach notes to unvalidated device data   Upon arrival

## 2014-07-24 NOTE — Op Note (Signed)
[facility]   OPERATIVE NOTE  PROCEDURE:   1.  right carotid endarterectomy with CorMatrix arterial patch reconstruction  PRE-OPERATIVE DIAGNOSIS: 1.  right carotid stenosis   POST-OPERATIVE DIAGNOSIS: same as above   SURGEON: Festus Barren, MD  ASSISTANT(S): Raul Del, PA-C  ANESTHESIA: General  ESTIMATED BLOOD LOSS: 100 cc  FINDING(S): 1.  Calcified right carotid plaque.  SPECIMEN(S):  Carotid plaque (sent to Pathology)  INDICATIONS:   [name] is a 69 y.o. female who presents with right carotid stenosis of 80-85%.  I discussed with the patient the risks, benefits, and alternatives to carotid endarterectomy.  I discussed the differences between carotid stenting and carotid endarterectomy. I discussed the procedural details of carotid endarterectomy with the patient.  The patient is aware that the risks of carotid endarterectomy include but are not limited to: bleeding, infection, stroke, myocardial infarction, death, cranial nerve injuries both temporary and permanent, neck hematoma, possible airway compromise, labile blood pressure post-operatively, cerebral hyperperfusion syndrome, and possible need for additional interventions in the future. The patient is aware of the risks and agrees to proceed forward with the procedure.  DESCRIPTION: After full informed written consent was obtained from the patient, the patient was brought back to the operating room and placed supine upon the operating table.  Prior to induction, the patient received IV antibiotics.  After obtaining adequate anesthesia, the patient was placed into a modified beach chair position with a shoulder roll in place and the patient's neck slightly hyperextended and rotated away from the surgical site.  The patient was prepped in the standard fashion for a carotid endarterectomy.  I made an incision anterior to the sternocleidomastoid muscle and dissected down through the subcutaneous  tissue.  The platysma was opened with electrocautery.  Then I dissected down to the internal jugular vein and facial vein.  The facial vein is ligated and divided between 2-0 silk ties.  This was dissected posteriorly until I obtained visualization of the common carotid artery.  Several crossing venous branches were ligated and divided between silk ties.  The carotid artery was dissected out and then a vessel loop was placed around the common carotid artery.  I then dissected in a periadventitial fashion along the common carotid artery up to the bifurcation.  I then identified the external carotid artery and the superior thyroid artery.  I placed a vessel loop around the superior thyroid artery, and I also dissected out the external carotid artery and placed a vessel loop around it. In the process of this dissection, the hypoglossal nerve was identified and protected from harm.  I then dissected out the internal carotid artery until I identified an area in the internal carotid artery clearly above the stenosis.  I dissected slightly distal to this area, and placed a vessel loop around the artery.  At this point, we gave the patient 6000 units of intravenous heparin.  After this was allowed to circulate for several minutes, I pulled up control on the vessel loops to clamp the internal carotid artery, external carotid artery, superior thyroid artery, and then the common carotid artery.  I then made an arteriotomy in the common carotid artery with a 11 blade, and extended the arteriotomy with a Potts scissor down into the common carotid artery, then I carried the arteriotomy through the bifurcation into the internal carotid artery until I reached an area that was not diseased.  At this point, I took the Pruitt-[place] shunt that previously been prepared and  I inserted it into the internal carotid artery first, and then into the common carotid artery taking care to flush and de-air prior to release of control. At this  point, I started the endarterectomy in the common carotid artery with a Penfield elevator and carried this dissection down into the common carotid artery circumferentially.  Then I transected the plaque at a segment where it was adherent and transected the plaque with Potts scissors.  I then carried this dissection up into the external carotid artery.  The plaque was extracted by unclamping the external carotid artery and performing an eversion endarterectomy.  The dissection was then carried into the internal carotid artery where a nice feathered end point was created with gentle traction.  I passed the plaque off the field as a specimen. At this point I removed all loose flecks and remaining disease possible.  At this point, I was satisfied that the minimal remaining disease was densely adherent to the wall and wall integrity was intact. The distal endpoint was tacked down with two 7-0 Prolene sutures.  I then fashioned a CorMatrix arterial patch for the artery and sewed it in place with two running stitch of 6-0 Prolene.  I started at the distal endpoint and ran one half the length of the arteriotomy.  I then cut and beveled the patch to an appropriate length to match the arteriotomy.  I started the second 6-0 Prolene at the proximal end point.  The medial suture line was completed and the lateral suture line was run approximately one quarter the length of the arteriotomy.  Prior to completing this patch angioplasty, I removed the shunt first from the internal carotid artery, from which there was excellent backbleeding, and clamped it.  Then I removed the shunt from the common carotid artery, from which there was excellent antegrade bleeding, and then clamped it.  At this point, I allowed the external carotid artery to backbleed, which was excellent.  Then I instilled heparinized saline in this patched artery and then completed the patch angioplasty in the usual fashion.  First, I released the clamp on the  external carotid artery, then I released it on the common carotid artery.  After waiting a few seconds, I then released the vessel loop on the internal carotid artery. Several minutes of pressure were held and 6-0 Prolene patch sutures were used as need for hemostasis.  At this point, I placed Surgicel and Evicel topical hemostatic agents.  There was no more active bleeding in the surgical site.  The sternocleidomastoid space was closed with three interrupted 3-0 Vicryl sutures. I then reapproximated the platysma muscle with a running stitch of 3-0 Vicryl.  The skin was then closed with a running subcuticular 4-0 Monocryl.  The skin was then cleaned, dried and Dermabond was used to reinforce the skin closure.  The patient awakened and was taken to the recovery room in stable condition, following commands and moving all four extremities without any apparent deficits.    COMPLICATIONS: none  CONDITION: stable  DEW,JASON  07/24/2014, 3:06 PM

## 2014-07-24 NOTE — Transfer of Care (Signed)
Immediate Anesthesia Transfer of Care Note  Patient: [name]  Procedure(s) Performed: Procedure(s): ENDARTERECTOMY CAROTID (Right)  Patient Location: PACU  Anesthesia Type:General  Level of Consciousness: awake, alert , oriented and patient cooperative  Airway & Oxygen Therapy: Patient Spontanous Breathing and Patient connected to face mask oxygen  Post-op Assessment: Report given to RN and Post -op Vital signs reviewed and stable  Post vital signs: Reviewed and stable  Last Vitals:  Filed Vitals:   07/24/14 1603  BP:   Pulse: 74  Temp: 37.2 C  Resp: 21    Complications: No apparent anesthesia complications

## 2014-07-24 NOTE — Anesthesia Preprocedure Evaluation (Addendum)
Anesthesia Evaluation  Patient identified by MRN, date of birth, ID band Patient awake    Reviewed: Allergy & Precautions, NPO status , Patient's Chart, lab work & pertinent test results, reviewed documented beta blocker date and time   Airway Mallampati: III  TM Distance: >3 FB     Dental  (+) Poor Dentition, Chipped, Missing   Pulmonary shortness of breath, asthma ,          Cardiovascular hypertension, + Peripheral Vascular Disease     Neuro/Psych    GI/Hepatic hiatal hernia, GERD-  ,  Endo/Other  diabetes  Renal/GU      Musculoskeletal   Abdominal   Peds  Hematology   Anesthesia Other Findings Allen's test ok.  Reproductive/Obstetrics                            Anesthesia Physical Anesthesia Plan  ASA: III  Anesthesia Plan: General   Post-op Pain Management:    Induction: Intravenous  Airway Management Planned: Oral ETT  Additional Equipment:   Intra-op Plan:   Post-operative Plan:   Informed Consent: I have reviewed the patients History and Physical, chart, labs and discussed the procedure including the risks, benefits and alternatives for the proposed anesthesia with the patient or authorized representative who has indicated his/her understanding and acceptance.     Plan Discussed with:   Anesthesia Plan Comments:         Anesthesia Quick Evaluation

## 2014-07-24 NOTE — Progress Notes (Deleted)
Upon Arrival

## 2014-07-24 NOTE — Anesthesia Procedure Notes (Signed)
Procedure Name: Intubation Date/Time: 07/24/2014 1:10 PM Performed by: Omer Jack Pre-anesthesia Checklist: Patient identified, Emergency Drugs available, Suction available, Patient being monitored and Timeout performed Patient Re-evaluated:Patient Re-evaluated prior to inductionOxygen Delivery Method: Simple face mask Preoxygenation: Pre-oxygenation with 100% oxygen Intubation Type: IV induction Ventilation: Two handed mask ventilation required Laryngoscope Size: Mac and 3 Grade View: Grade III Tube type: Oral Tube size: 7.0 mm Number of attempts: 2 Placement Confirmation: ETT inserted through vocal cords under direct vision,  positive ETCO2 and breath sounds checked- equal and bilateral Secured at: 20 cm Tube secured with: Tape Dental Injury: Teeth and Oropharynx as per pre-operative assessment

## 2014-07-24 NOTE — OR Nursing (Signed)
Carotid shunt in 1413, [id number].

## 2014-07-24 NOTE — H&P (Signed)
[facility] History & Physical Update  The patient was interviewed and re-examined.  The patient's previous History and Physical has been reviewed and is unchanged.  There is no change in the plan of care.  DEW,JASON, MD  07/24/2014, 12:43 PM

## 2014-07-24 NOTE — Anesthesia Postprocedure Evaluation (Signed)
  Anesthesia Post-op Note  Patient: [name]  Procedure(s) Performed: Procedure(s): ENDARTERECTOMY CAROTID (Right)  Anesthesia type:General  Patient location: PACU  Post pain: Pain level controlled  Post assessment: Post-op Vital signs reviewed, Patient's Cardiovascular Status Stable, Respiratory Function Stable, Patent Airway and No signs of Nausea or vomiting  Post vital signs: Reviewed and stable  Last Vitals:  Filed Vitals:   07/24/14 1647  BP:   Pulse: 66  Temp:   Resp: 16    Level of consciousness: awake, alert  and patient cooperative  Complications: No apparent anesthesia complications

## 2014-07-25 ENCOUNTER — Encounter: Payer: Self-pay | Admitting: Vascular Surgery

## 2014-07-25 LAB — GLUCOSE, CAPILLARY
GLUCOSE-CAPILLARY: 204 mg/dL — AB (ref 65–99)
Glucose-Capillary: 180 mg/dL — ABNORMAL HIGH (ref 65–99)
Glucose-Capillary: 202 mg/dL — ABNORMAL HIGH (ref 65–99)
Glucose-Capillary: 261 mg/dL — ABNORMAL HIGH (ref 65–99)

## 2014-07-25 LAB — CBC
HEMATOCRIT: 34.3 % — AB (ref 35.0–47.0)
Hemoglobin: 10.8 g/dL — ABNORMAL LOW (ref 12.0–16.0)
MCH: 25.7 pg — AB (ref 26.0–34.0)
MCHC: 31.6 g/dL — ABNORMAL LOW (ref 32.0–36.0)
MCV: 81.5 fL (ref 80.0–100.0)
Platelets: 177 10*3/uL (ref 150–440)
RBC: 4.21 MIL/uL (ref 3.80–5.20)
RDW: 16.8 % — AB (ref 11.5–14.5)
WBC: 11.4 10*3/uL — ABNORMAL HIGH (ref 3.6–11.0)

## 2014-07-25 LAB — BASIC METABOLIC PANEL
ANION GAP: 5 (ref 5–15)
BUN: 12 mg/dL (ref 6–20)
CALCIUM: 7.8 mg/dL — AB (ref 8.9–10.3)
CHLORIDE: 102 mmol/L (ref 101–111)
CO2: 29 mmol/L (ref 22–32)
CREATININE: 0.59 mg/dL (ref 0.44–1.00)
GFR calc Af Amer: >60 mL/min (ref >60)
GFR calc non Af Amer: >60 mL/min (ref >60)
GLUCOSE: 202 mg/dL — AB (ref 65–99)
Potassium: 4.1 mmol/L (ref 3.5–5.1)
Sodium: 136 mmol/L (ref 135–145)

## 2014-07-25 MED ORDER — OXYCODONE-ACETAMINOPHEN 5-325 MG PO TABS: ORAL_TABLET | ORAL | Status: DC

## 2014-07-25 MED ORDER — INSULIN ASPART 100 UNIT/ML ~~LOC~~ SOLN
20.0000 [IU] | Freq: Three times a day (TID) | SUBCUTANEOUS | Status: DC
  Administered 2014-07-25 (×2): 20 [IU] via SUBCUTANEOUS
  Filled 2014-07-25 (×2): qty 20

## 2014-07-25 MED ORDER — CLOPIDOGREL BISULFATE 75 MG PO TABS: 75.0000 mg | ORAL_TABLET | Freq: Every day | ORAL | Status: DC

## 2014-07-25 MED ORDER — FAMOTIDINE 20 MG PO TABS: 20.0000 mg | ORAL_TABLET | Freq: Two times a day (BID) | ORAL | Status: DC

## 2014-07-25 MED ORDER — INSULIN GLARGINE 100 UNIT/ML ~~LOC~~ SOLN
50.0000 [IU] | Freq: Two times a day (BID) | SUBCUTANEOUS | Status: DC
  Administered 2014-07-25: 50 [IU] via SUBCUTANEOUS
  Filled 2014-07-25 (×3): qty 0.5

## 2014-07-25 NOTE — Progress Notes (Signed)
Alert and oriented.  Speech clear. Tongue midline. Swallows without difficulty. MOEX4 without problems.  Up in hall x 2 with standby assist. Tolerates well but with her stated "normal" ShOB.  Sats have improved all am. Now with sats 89-94% on room air. NSR on monitor. Has voided 600 ml clear yellow urine after foley removal. Pt home instructions, new med plavix, symptoms to report, incisioncare, activity limitations, follow up with MD discussed with pt and her daughter. Questions answered.  All handouts to be given at time of discharge

## 2014-07-25 NOTE — Discharge Summary (Signed)
[facility]    Discharge Summary    Patient ID:  [name] MRN: [id number] DOB/AGE: [birth year] 69 y.o.  Admit date: 07/24/2014 Discharge date: 07/25/2014 Date of Surgery: 07/24/2014 Surgeon: Surgeon(s): Annice Needy, MD  Admission Diagnosis: CAROTID ARTERY STENOSIS  Discharge Diagnoses:  CAROTID ARTERY STENOSIS  Secondary Diagnoses: Past Medical History  Diagnosis Date   Hypertension    Heart murmur    Peripheral vascular disease    Shortness of breath dyspnea    Asthma    Diabetes mellitus without complication    GERD (gastroesophageal reflux disease)    History of hiatal hernia    Restless leg syndrome    Panic attacks     Procedure(s): ENDARTERECTOMY CAROTID  Discharged Condition: good  HPI:  [name] is a 69 y.o. female with multiple medical co-morbidities who presented with right carotid artery stenosis of 80-85%.   Hospital Course:  [name] is a 69 y.o. female is S/P Right Procedure(s): ENDARTERECTOMY CAROTID Extubated: POD # 0 Physical exam:  A&Ox3, NAD Head: Tongue midline, no facial droop. Neck: Incision with minimal amount of swelling, minimal ecchymosis noted. Dermabond intact. Trachea midline.  CV: RRR Pulmonary: CTA Bilaterally Abdomen: Soft, Nontender, Nondistended Neuro: Sensation / motor intact upper and lower equal bilaterally. Post-op wounds clean, dry, intact or healing well Pt. Ambulating, voiding and taking PO diet without difficulty. Pt pain controlled with PO pain meds. Labs as below Complications:none  Consults:     Significant Diagnostic Studies: CBC Lab Results  Component Value Date   WBC 11.4* 07/25/2014   HGB 10.8* 07/25/2014   HCT 34.3* 07/25/2014   MCV 81.5 07/25/2014   PLT 177 07/25/2014    BMET    Component Value Date/Time   NA 136 07/25/2014 0419   NA 139 12/19/2013 1903   K 4.1 07/25/2014 0419   K 4.1 12/19/2013 1903   CL 102 07/25/2014 0419   CL  101 12/19/2013 1903   CO2 29 07/25/2014 0419   CO2 29 12/19/2013 1903   GLUCOSE 202* 07/25/2014 0419   GLUCOSE 198* 12/19/2013 1903   BUN 12 07/25/2014 0419   BUN 9 12/19/2013 1903   CREATININE 0.59 07/25/2014 0419   CREATININE 0.73 12/19/2013 1903   CALCIUM 7.8* 07/25/2014 0419   CALCIUM 8.6 12/19/2013 1903   GFRNONAA >60 07/25/2014 0419   GFRAA >60 07/25/2014 0419   COAG Lab Results  Component Value Date   INR 1.03 07/17/2014     Disposition:  Discharge to :Home    Medication List    STOP taking these medications        ibuprofen 200 MG tablet  Commonly known as:  ADVIL,MOTRIN      TAKE these medications        aspirin EC 81 MG tablet  Take 81 mg by mouth daily.     clopidogrel 75 MG tablet  Commonly known as:  PLAVIX  Take 1 tablet (75 mg total) by mouth daily with breakfast.     gabapentin 100 MG capsule  Commonly known as:  NEURONTIN  Take 100-300 mg by mouth at bedtime. Dose is based on pain level     glimepiride 2 MG tablet  Commonly known as:  AMARYL  Take 2 mg by mouth 2 (two) times daily.     hydrochlorothiazide 12.5 MG tablet  Commonly known as:  HYDRODIURIL  Take 12.5 mg by mouth daily.     insulin aspart 100 UNIT/ML FlexPen  Commonly  known as:  NOVOLOG  Inject 20 Units into the skin 3 (three) times daily with meals.     insulin glargine 100 UNIT/ML injection  Commonly known as:  LANTUS  Inject 50 Units into the skin 2 (two) times daily.     loratadine 10 MG tablet  Commonly known as:  CLARITIN  Take 10 mg by mouth daily.     lovastatin 20 MG tablet  Commonly known as:  MEVACOR  Take 20 mg by mouth at bedtime.     metFORMIN 500 MG 24 hr tablet  Commonly known as:  GLUCOPHAGE-XR  Take 1,000 mg by mouth 2 (two) times daily.     omeprazole 40 MG capsule  Commonly known as:  PRILOSEC  Take 40 mg by mouth every evening.     oxyCODONE-acetaminophen 5-325 MG per tablet  Commonly known as:  PERCOCET/ROXICET  One to Two Tabs by Mouth  Every Four to Six Hours As Needed for Pain       Verbal and written Discharge instructions given to the patient. Wound care per Discharge AVS     Follow-up Information    Follow up with DEW,JASON, MD In 3 weeks.   Specialties:  Vascular Surgery, Radiology, Interventional Cardiology   Contact information:   [address] [place] [address] [phone]      s/p right CEA - doing well, tolerating a regular diet diet, OOB / ambulating independently, pain under control with PO meds, no neurological deficits noted, incision healing well.   1) Ok to d/c home 2) ASA and Plavix 3) PO pain pills 4) F/U in 3-4 weeks  Signed: Tonette Lederer  07/25/2014, 4:51 PM

## 2014-07-25 NOTE — Discharge Instructions (Signed)
You may shower as of tomorrow. Dermabond to fall off on its own. Please seek medical attention if you experience any stroke like symptoms, chest pain or SOB. Please call our office if you experience any fever, nausea, vomiting or drainage from incision sites.

## 2014-07-25 NOTE — Progress Notes (Signed)
 [facility]  Daily Progress Note   Subjective: 1 Day Post-Op: Right carotid endarterectomy with CorMatrix arterial patch reconstruction  Patient is without complaint this AM. States she feels better than she thought she would. She denies any SOB or dysphagia. Only using tylenol for pain. She has been OOB to chair. Tolerating a liquid diet.   Objective: Filed Vitals:   07/25/14 0300 07/25/14 0400 07/25/14 0500 07/25/14 0600  BP: 118/67 138/71 124/59 106/57  Pulse: 71 79 74 77  Temp: 98.1 F (36.7 C)     TempSrc: Oral     Resp: 12 21 11 12   Height:      Weight:      SpO2: 97% 97% 96% 97%    Intake/Output Summary (Last 24 hours) at 07/25/14 0724 Last data filed at 07/25/14 0600  Gross per 24 hour  Intake   2420 ml  Output    675 ml  Net   1745 ml    Physical Exam: A&Ox3, NAD Head: Tongue midline, no facial droop. Neck: Incision with minimal amount of swelling, minimal ecchymosis noted. Dermabond intact. Trachea midline.  CV: RRR Pulmonary: CTA Bilaterally Abdomen: Soft, Nontender, Nondistended Neuro: Sensation / motor intact upper and lower equal bilaterally.   Laboratory: CBC    Component Value Date/Time   WBC 11.4* 07/25/2014 0419   WBC 9.9 12/19/2013 1903   HGB 10.8* 07/25/2014 0419   HGB 12.5 12/19/2013 1903   HCT 34.3* 07/25/2014 0419   HCT 40.6 12/19/2013 1903   PLT 177 07/25/2014 0419   PLT 205 12/19/2013 1903    BMET    Component Value Date/Time   NA 136 07/25/2014 0419   NA 139 12/19/2013 1903   K 4.1 07/25/2014 0419   K 4.1 12/19/2013 1903   CL 102 07/25/2014 0419   CL 101 12/19/2013 1903   CO2 29 07/25/2014 0419   CO2 29 12/19/2013 1903   GLUCOSE 202* 07/25/2014 0419   GLUCOSE 198* 12/19/2013 1903   BUN 12 07/25/2014 0419   BUN 9 12/19/2013 1903   CREATININE 0.59 07/25/2014 0419   CREATININE 0.73 12/19/2013 1903   CALCIUM 7.8* 07/25/2014 0419   CALCIUM 8.6 12/19/2013 1903   GFRNONAA >60 07/25/2014 0419   GFRAA >60  07/25/2014 0419    Assessment/Planning: S/p right CEA - post op day #1 - doing well, tolerating a liquid diet, OOB to chair, pain under control with PO meds, no neurological deficits noted, incision healing well.   1)Advance diet to regular (diabetic) 2) D/C foley - please page if patient does not void in 8 hours 3) OOB ambulation 4) Patient may be discharged home when tolerating a regular diet, patient has voided s/p foley removed and patient is ambulating indepedently. 5) Discussed with Dr. 07/27/2014 [facility] PA-C 07/25/2014 7:24 AM

## 2014-07-29 LAB — SURGICAL PATHOLOGY

## 2014-12-30 ENCOUNTER — Other Ambulatory Visit: Payer: Self-pay | Admitting: Family Medicine

## 2014-12-30 DIAGNOSIS — Z1231 Encounter for screening mammogram for malignant neoplasm of breast: Secondary | ICD-10-CM

## 2015-03-13 ENCOUNTER — Emergency Department: Payer: Medicare Other

## 2015-03-13 ENCOUNTER — Inpatient Hospital Stay
Admission: EM | Admit: 2015-03-13 | Discharge: 2015-03-15 | DRG: 292 | Disposition: A | Discharge status: Home / Self Care | Payer: Medicare Other | Attending: Internal Medicine | Admitting: Internal Medicine

## 2015-03-13 ENCOUNTER — Encounter: Payer: Self-pay | Admitting: Emergency Medicine

## 2015-03-13 DIAGNOSIS — E1151 Type 2 diabetes mellitus with diabetic peripheral angiopathy without gangrene: Secondary | ICD-10-CM | POA: Diagnosis present

## 2015-03-13 DIAGNOSIS — Z9102 Food additives allergy status: Secondary | ICD-10-CM | POA: Diagnosis not present

## 2015-03-13 DIAGNOSIS — Z90710 Acquired absence of both cervix and uterus: Secondary | ICD-10-CM | POA: Diagnosis not present

## 2015-03-13 DIAGNOSIS — Z9981 Dependence on supplemental oxygen: Secondary | ICD-10-CM

## 2015-03-13 DIAGNOSIS — Z7984 Long term (current) use of oral hypoglycemic drugs: Secondary | ICD-10-CM | POA: Diagnosis not present

## 2015-03-13 DIAGNOSIS — R601 Generalized edema: Secondary | ICD-10-CM | POA: Diagnosis present

## 2015-03-13 DIAGNOSIS — J811 Chronic pulmonary edema: Secondary | ICD-10-CM

## 2015-03-13 DIAGNOSIS — Z888 Allergy status to other drugs, medicaments and biological substances status: Secondary | ICD-10-CM

## 2015-03-13 DIAGNOSIS — K219 Gastro-esophageal reflux disease without esophagitis: Secondary | ICD-10-CM | POA: Diagnosis present

## 2015-03-13 DIAGNOSIS — R0902 Hypoxemia: Secondary | ICD-10-CM | POA: Diagnosis present

## 2015-03-13 DIAGNOSIS — I739 Peripheral vascular disease, unspecified: Secondary | ICD-10-CM | POA: Diagnosis present

## 2015-03-13 DIAGNOSIS — I110 Hypertensive heart disease with heart failure: Secondary | ICD-10-CM | POA: Diagnosis present

## 2015-03-13 DIAGNOSIS — I509 Heart failure, unspecified: Secondary | ICD-10-CM

## 2015-03-13 DIAGNOSIS — Z8249 Family history of ischemic heart disease and other diseases of the circulatory system: Secondary | ICD-10-CM

## 2015-03-13 DIAGNOSIS — G2581 Restless legs syndrome: Secondary | ICD-10-CM | POA: Diagnosis present

## 2015-03-13 DIAGNOSIS — E785 Hyperlipidemia, unspecified: Secondary | ICD-10-CM | POA: Diagnosis present

## 2015-03-13 DIAGNOSIS — F410 Panic disorder [episodic paroxysmal anxiety] without agoraphobia: Secondary | ICD-10-CM | POA: Diagnosis present

## 2015-03-13 DIAGNOSIS — Z800 Family history of malignant neoplasm of digestive organs: Secondary | ICD-10-CM | POA: Diagnosis not present

## 2015-03-13 DIAGNOSIS — Z7982 Long term (current) use of aspirin: Secondary | ICD-10-CM | POA: Diagnosis not present

## 2015-03-13 DIAGNOSIS — Z6841 Body Mass Index (BMI) 40.0 and over, adult: Secondary | ICD-10-CM | POA: Diagnosis not present

## 2015-03-13 DIAGNOSIS — J45909 Unspecified asthma, uncomplicated: Secondary | ICD-10-CM | POA: Diagnosis present

## 2015-03-13 LAB — GLUCOSE, CAPILLARY: GLUCOSE-CAPILLARY: 125 mg/dL — AB (ref 65–99)

## 2015-03-13 LAB — URINALYSIS COMPLETE WITH MICROSCOPIC (ARMC ONLY)
BILIRUBIN URINE: NEGATIVE
Bacteria, UA: NONE SEEN
Glucose, UA: NEGATIVE mg/dL
Hgb urine dipstick: NEGATIVE
KETONES UR: NEGATIVE mg/dL
Nitrite: NEGATIVE
PH: 7 (ref 5.0–8.0)
PROTEIN: NEGATIVE mg/dL
Specific Gravity, Urine: 1.005 (ref 1.005–1.030)

## 2015-03-13 LAB — CBC
HEMATOCRIT: 36 % (ref 35.0–47.0)
Hemoglobin: 11.2 g/dL — ABNORMAL LOW (ref 12.0–16.0)
MCH: 24.1 pg — AB (ref 26.0–34.0)
MCHC: 31 g/dL — ABNORMAL LOW (ref 32.0–36.0)
MCV: 77.8 fL — ABNORMAL LOW (ref 80.0–100.0)
Platelets: 232 10*3/uL (ref 150–440)
RBC: 4.63 MIL/uL (ref 3.80–5.20)
RDW: 19.5 % — ABNORMAL HIGH (ref 11.5–14.5)
WBC: 10.1 10*3/uL (ref 3.6–11.0)

## 2015-03-13 LAB — BASIC METABOLIC PANEL
Anion gap: 7 (ref 5–15)
BUN: 10 mg/dL (ref 6–20)
CO2: 31 mmol/L (ref 22–32)
Calcium: 8.6 mg/dL — ABNORMAL LOW (ref 8.9–10.3)
Chloride: 98 mmol/L — ABNORMAL LOW (ref 101–111)
Creatinine, Ser: 0.58 mg/dL (ref 0.44–1.00)
GFR calc non Af Amer: >60 mL/min (ref >60)
Glucose, Bld: 109 mg/dL — ABNORMAL HIGH (ref 65–99)
POTASSIUM: 3.7 mmol/L (ref 3.5–5.1)
SODIUM: 136 mmol/L (ref 135–145)

## 2015-03-13 LAB — TROPONIN I
Troponin I: 0.06 ng/mL — ABNORMAL HIGH (ref <0.031)
Troponin I: 0.03 ng/mL (ref <0.031)

## 2015-03-13 LAB — BRAIN NATRIURETIC PEPTIDE: B Natriuretic Peptide: 68 pg/mL (ref 0.0–100.0)

## 2015-03-13 MED ORDER — HEPARIN SODIUM (PORCINE) 5000 UNIT/ML IJ SOLN
5000.0000 [IU] | Freq: Three times a day (TID) | INTRAMUSCULAR | Status: DC
  Administered 2015-03-13 – 2015-03-15 (×5): 5000 [IU] via SUBCUTANEOUS
  Filled 2015-03-13 (×5): qty 1

## 2015-03-13 MED ORDER — GABAPENTIN 100 MG PO CAPS: 200.0000 mg | ORAL_CAPSULE | Freq: Every evening | ORAL | Status: DC | PRN

## 2015-03-13 MED ORDER — LORATADINE 10 MG PO TABS
10.0000 mg | ORAL_TABLET | Freq: Every day | ORAL | Status: DC
  Administered 2015-03-14 – 2015-03-15 (×2): 10 mg via ORAL
  Filled 2015-03-13 (×2): qty 1

## 2015-03-13 MED ORDER — INSULIN ASPART 100 UNIT/ML ~~LOC~~ SOLN
0.0000 [IU] | Freq: Three times a day (TID) | SUBCUTANEOUS | Status: DC
  Administered 2015-03-14 – 2015-03-15 (×3): 2 [IU] via SUBCUTANEOUS
  Filled 2015-03-13 (×3): qty 2

## 2015-03-13 MED ORDER — OXYCODONE-ACETAMINOPHEN 5-325 MG PO TABS
1.0000 | ORAL_TABLET | ORAL | Status: DC | PRN
  Administered 2015-03-13: 1 via ORAL
  Administered 2015-03-14: 2 via ORAL
  Administered 2015-03-14: 1 via ORAL
  Filled 2015-03-13 (×3): qty 2

## 2015-03-13 MED ORDER — PRAVASTATIN SODIUM 20 MG PO TABS
10.0000 mg | ORAL_TABLET | Freq: Every day | ORAL | Status: DC
  Administered 2015-03-14: 10 mg via ORAL
  Filled 2015-03-13: qty 1

## 2015-03-13 MED ORDER — FUROSEMIDE 10 MG/ML IJ SOLN
40.0000 mg | Freq: Once | INTRAMUSCULAR | Status: AC
  Administered 2015-03-13: 40 mg via INTRAVENOUS
  Filled 2015-03-13: qty 4

## 2015-03-13 MED ORDER — FUROSEMIDE 10 MG/ML IJ SOLN
20.0000 mg | Freq: Three times a day (TID) | INTRAMUSCULAR | Status: DC
  Administered 2015-03-13 – 2015-03-14 (×2): 20 mg via INTRAVENOUS
  Filled 2015-03-13 (×2): qty 2

## 2015-03-13 MED ORDER — PANTOPRAZOLE SODIUM 40 MG PO TBEC
40.0000 mg | DELAYED_RELEASE_TABLET | Freq: Every day | ORAL | Status: DC
Start: 2015-03-13 — End: 2015-03-15
  Administered 2015-03-13 – 2015-03-15 (×3): 40 mg via ORAL
  Filled 2015-03-13 (×3): qty 1

## 2015-03-13 MED ORDER — ASPIRIN 81 MG PO CHEW
324.0000 mg | CHEWABLE_TABLET | Freq: Once | ORAL | Status: AC
  Administered 2015-03-13: 324 mg via ORAL
  Filled 2015-03-13: qty 4

## 2015-03-13 MED ORDER — ASPIRIN EC 81 MG PO TBEC
81.0000 mg | DELAYED_RELEASE_TABLET | Freq: Every day | ORAL | Status: DC
  Administered 2015-03-14 – 2015-03-15 (×2): 81 mg via ORAL
  Filled 2015-03-13 (×2): qty 1

## 2015-03-13 MED ORDER — GLIMEPIRIDE 2 MG PO TABS
2.0000 mg | ORAL_TABLET | Freq: Two times a day (BID) | ORAL | Status: DC
  Administered 2015-03-13 – 2015-03-15 (×4): 2 mg via ORAL
  Filled 2015-03-13 (×4): qty 1

## 2015-03-13 NOTE — ED Provider Notes (Signed)
[facility] Emergency Department Provider Note  ____________________________________________  Time seen: Approximately 305 PM  I have reviewed the triage vital signs and the nursing notes.   HISTORY  Chief Complaint Leg Swelling    HPI [name] is a 70 y.o. female with a history of hypertension as well as peripheral vascular disease was presenting today with 1 week of lower leg as well as abdominal swelling and shortness of breath.  She says that the shortness of breath worsens with exertion. Denies any pain. Says that she used to be on Plavix, presumably for her peripheral vascular disease, but is no longer on it. She denies taking any sort of diuretic. Denies any history of kidney disease.Denies any cough or fever. She says that the lower legs have also been pruritic and she has been itching at them.   Past Medical History  Diagnosis Date  . Hypertension   . Heart murmur   . Peripheral vascular disease (HCC)   . Shortness of breath dyspnea   . Asthma   . Diabetes mellitus without complication (HCC)   . GERD (gastroesophageal reflux disease)   . History of hiatal hernia   . Restless leg syndrome   . Panic attacks     Patient Active Problem List   Diagnosis Date Noted  . Carotid stenosis 07/24/2014    Past Surgical History  Procedure Laterality Date  . Abdominal hysterectomy    . Fracture surgery Left     fractured ankle  . Tonsillectomy    . Eye surgery Left     cataract  . Endarterectomy Right 07/24/2014    Procedure: ENDARTERECTOMY CAROTID;  Surgeon: Annice Needy, MD;  Location: [facility] ORS;  Service: Vascular;  Laterality: Right;    Current Outpatient Rx  Name  Route  Sig  Dispense  Refill  . aspirin EC 81 MG tablet   Oral   Take 81 mg by mouth daily.         . clopidogrel (PLAVIX) 75 MG tablet   Oral   Take 1 tablet (75 mg total) by mouth daily with breakfast.   30 tablet   1   . gabapentin (NEURONTIN) 100 MG capsule    Oral   Take 100-300 mg by mouth at bedtime. Dose is based on pain level         . glimepiride (AMARYL) 2 MG tablet   Oral   Take 2 mg by mouth 2 (two) times daily.         . hydrochlorothiazide (HYDRODIURIL) 12.5 MG tablet   Oral   Take 12.5 mg by mouth daily.         . insulin aspart (NOVOLOG) 100 UNIT/ML FlexPen   Subcutaneous   Inject 20 Units into the skin 3 (three) times daily with meals.         . insulin glargine (LANTUS) 100 UNIT/ML injection   Subcutaneous   Inject 50 Units into the skin 2 (two) times daily.         Marland Kitchen loratadine (CLARITIN) 10 MG tablet   Oral   Take 10 mg by mouth daily.         Marland Kitchen lovastatin (MEVACOR) 20 MG tablet   Oral   Take 20 mg by mouth at bedtime.         . metFORMIN (GLUCOPHAGE-XR) 500 MG 24 hr tablet   Oral   Take 1,000 mg by mouth 2 (two) times daily.         Marland Kitchen  omeprazole (PRILOSEC) 40 MG capsule   Oral   Take 40 mg by mouth every evening.         Marland Kitchen oxyCODONE-acetaminophen (PERCOCET/ROXICET) 5-325 MG per tablet      One to Two Tabs by Mouth Every Four to Six Hours As Needed for Pain   30 tablet   0     Allergies Codeine and Wheat bran  No family history on file.  Social History Social History  Substance Use Topics  . Smoking status: Never Smoker   . Smokeless tobacco: Never Used  . Alcohol Use: No    Review of Systems Constitutional: No fever/chills Eyes: No visual changes. ENT: No sore throat. Cardiovascular: Denies chest pain. Respiratory: As above Gastrointestinal: No abdominal pain.  No nausea, no vomiting.  No diarrhea.  No constipation. Genitourinary: Negative for dysuria. Musculoskeletal: Negative for back pain. Skin: Negative for rash. Neurological: Negative for headaches, focal weakness or numbness.  10-point ROS otherwise negative.  ____________________________________________   PHYSICAL EXAM:  VITAL SIGNS: ED Triage Vitals  Enc Vitals Group     BP 03/13/15 1157 154/71 mmHg      Pulse Rate 03/13/15 1157 96     Resp 03/13/15 1157 20     Temp 03/13/15 1157 97.9 F (36.6 C)     Temp Source 03/13/15 1157 Oral     SpO2 03/13/15 1157 96 %     Weight 03/13/15 1157 260 lb (117.935 kg)     Height 03/13/15 1157 5\' 2"  (1.575 m)     Head Cir --      Peak Flow --      Pain Score 03/13/15 1157 4     Pain Loc --      Pain Edu? --      Excl. in GC? --     Constitutional: Alert and oriented. Well appearing and in no acute distress. Eyes: Conjunctivae are normal. PERRL. EOMI. Head: Atraumatic. Nose: No congestion/rhinnorhea. Mouth/Throat: Mucous membranes are moist.  Oropharynx non-erythematous. Neck: No stridor.   Cardiovascular: Normal rate, regular rhythm. Grossly normal heart sounds.  Good peripheral circulation. Respiratory: Normal respiratory effort.  No retractions. Mild rales to the lower field as well as mid fields bilaterally.  Gastrointestinal: Soft and nontender. No distention. No abdominal bruits. No CVA tenderness. Musculoskeletal: No lower extremity tenderness.  Bilateral lower extremities with moderate edema from the feet up to the knees bilaterally.  No joint effusions. Patient appears to have diffuse anasarca especially in the lower abdomen. There is no obvious spot of weeping skin that I can visualize. Neurologic:  Normal speech and language. No gross focal neurologic deficits are appreciated. No gait instability. Skin:  Skin is warm, dry. No rash noted. Multiple excoriations to the bilateral lower extremities over the calves without any induration, pus. Psychiatric: Mood and affect are normal. Speech and behavior are normal.  ____________________________________________   LABS (all labs ordered are listed, but only abnormal results are displayed)  Labs Reviewed  TROPONIN I - Abnormal; Notable for the following:    Troponin I 0.06 (*)    All other components within normal limits  CBC - Abnormal; Notable for the following:    Hemoglobin 11.2 (*)     MCV 77.8 (*)    MCH 24.1 (*)    MCHC 31.0 (*)    RDW 19.5 (*)    All other components within normal limits  BASIC METABOLIC PANEL - Abnormal; Notable for the following:    Chloride 98 (*)  Glucose, Bld 109 (*)    Calcium 8.6 (*)    All other components within normal limits  BRAIN NATRIURETIC PEPTIDE  URINALYSIS COMPLETEWITH MICROSCOPIC ([facility] ONLY)   ____________________________________________  EKG  ED ECG REPORT I, Schaevitz,  Teena Irani, the attending physician, personally viewed and interpreted this ECG.   Date: 03/13/2015  EKG Time: 1539  Rate: 88  Rhythm: normal sinus rhythm  Axis: Normal axis  Intervals:left anterior fascicular block  ST&T Change: No ST segment elevation or depression. No abnormal T-wave inversion.  ____________________________________________  RADIOLOGY  Cardiac enlargement and vascular congestion without overt pulmonary edema. Small left pleural effusion and overlying atelectasis. ____________________________________________   PROCEDURES  ____________________________________________   INITIAL IMPRESSION / ASSESSMENT AND PLAN / ED COURSE  Pertinent labs & imaging results that were available during my care of the patient were reviewed by me and considered in my medical decision making (see chart for details).  ----------------------------------------- 4:32 PM on 03/13/2015 -----------------------------------------  Patient resting comfortably at this time sitting in a chair at her bedside. Reviewed her records and is not in any sort of cardiac workup since 2015. We'll admit to the hospital secondary to shortness of breath with her elevated troponin and also evidence of CHF on her x-ray. Given Lasix and aspirin. Signed out to Dr. Junious Dresser. Extremity the plan as well as the diagnosis of the patient and she understands.   ____________________________________________   FINAL CLINICAL IMPRESSION(S) / ED DIAGNOSES  CHF. Anasarca.  Dyspnea.    Myrna Blazer, MD 03/13/15 [phone]

## 2015-03-13 NOTE — H&P (Signed)
[facility] - [place] at [facility]   PATIENT NAME: [name]    MR#:  [id number]  DATE OF BIRTH:  1945/08/18  DATE OF ADMISSION:  03/13/2015  PRIMARY CARE PHYSICIAN: Lennart Pall, MD   REQUESTING/REFERRING PHYSICIAN: [email or web address]  CHIEF COMPLAINT:   Chief Complaint  Patient presents with  . Leg Swelling    HISTORY OF PRESENT ILLNESS: [name]  is a 70 y.o. female with a known history of hypertension, peripheral vascular disease, shortness of breath, asthma, diabetes, - for last 1 week has gradual worsening in her legs swelling which is all the way up to her abdominal wall and she also started losing some fluid from her abdominal wall. She drinks a lot of liquids every day at home and does not feel herself so does not know how much she gained weight. She denies any chest pain or shortness of breath.  She started noticing some decreased urination now and so concerned with that she decided to come to emergency room today. Her x-ray noted to have cardiomegaly with some dilated of the vasculature- and given as admission for CHF.  PAST MEDICAL HISTORY:   Past Medical History  Diagnosis Date  . Hypertension   . Heart murmur   . Peripheral vascular disease (HCC)   . Shortness of breath dyspnea   . Asthma   . Diabetes mellitus without complication (HCC)   . GERD (gastroesophageal reflux disease)   . History of hiatal hernia   . Restless leg syndrome   . Panic attacks     PAST SURGICAL HISTORY: Past Surgical History  Procedure Laterality Date  . Abdominal hysterectomy    . Fracture surgery Left     fractured ankle  . Tonsillectomy    . Eye surgery Left     cataract  . Endarterectomy Right 07/24/2014    Procedure: ENDARTERECTOMY CAROTID;  Surgeon: Annice Needy, MD;  Location: [facility] ORS;  Service: Vascular;  Laterality: Right;    SOCIAL HISTORY:  Social History  Substance Use Topics  . Smoking status: Never Smoker   . Smokeless tobacco: Never Used   . Alcohol Use: No    FAMILY HISTORY:  Family History  Problem Relation Age of Onset  . Pancreatic cancer Mother   . CAD Father   . Hypertension Father   . Pancreatic cancer Father   . CAD Brother     DRUG ALLERGIES:  Allergies  Allergen Reactions  . Codeine Other (See Comments)    Reaction:  Dizziness   . Wheat Bran Other (See Comments)    Reaction:  Sneezing     REVIEW OF SYSTEMS:   CONSTITUTIONAL: No fever, fatigue or weakness.  EYES: No blurred or double vision.  EARS, NOSE, AND THROAT: No tinnitus or ear pain.  RESPIRATORY: No cough, shortness of breath, wheezing or hemoptysis.  CARDIOVASCULAR: No chest pain, orthopnea, positive for severe edema.  GASTROINTESTINAL: No nausea, vomiting, diarrhea or abdominal pain.  GENITOURINARY: No dysuria, hematuria.  ENDOCRINE: No polyuria, nocturia,  HEMATOLOGY: No anemia, easy bruising or bleeding SKIN: No rash or lesion. MUSCULOSKELETAL: No joint pain or arthritis.   NEUROLOGIC: No tingling, numbness, weakness.  PSYCHIATRY: No anxiety or depression.   MEDICATIONS AT HOME:  Prior to Admission medications   Medication Sig Start Date End Date Taking? Authorizing Provider  aspirin EC 81 MG tablet Take 81 mg by mouth daily.   Yes Historical Provider, MD  gabapentin (NEURONTIN) 100 MG capsule Take 200-300 mg by mouth  at bedtime as needed (for pain).    Yes Historical Provider, MD  glimepiride (AMARYL) 2 MG tablet Take 2 mg by mouth 2 (two) times daily.   Yes Historical Provider, MD  hydrochlorothiazide (HYDRODIURIL) 12.5 MG tablet Take 12.5 mg by mouth daily.   Yes Historical Provider, MD  loratadine (CLARITIN) 10 MG tablet Take 10 mg by mouth daily.   Yes Historical Provider, MD  lovastatin (MEVACOR) 20 MG tablet Take 20 mg by mouth at bedtime.   Yes Historical Provider, MD  metFORMIN (GLUCOPHAGE-XR) 500 MG 24 hr tablet Take 1,000 mg by mouth 2 (two) times daily.   Yes Historical Provider, MD  omeprazole (PRILOSEC) 40 MG capsule  Take 40 mg by mouth at bedtime.    Yes Historical Provider, MD  oxyCODONE-acetaminophen (PERCOCET/ROXICET) 5-325 MG tablet Take 1-2 tablets by mouth every 4 (four) hours as needed for severe pain.   Yes Historical Provider, MD      PHYSICAL EXAMINATION:   VITAL SIGNS: Blood pressure 179/84, pulse 104, temperature 97.9 F (36.6 C), temperature source Oral, resp. rate 20, height 5\' 2"  (1.575 m), weight 117.935 kg (260 lb), SpO2 96 %.  GENERAL:  70 y.o.-year-old patient lying in the bed with no acute distress.  EYES: Pupils equal, round, reactive to light and accommodation. No scleral icterus. Extraocular muscles intact.  HEENT: Head atraumatic, normocephalic. Oropharynx and nasopharynx clear.  NECK:  Supple, no jugular venous distention. No thyroid enlargement, no tenderness.  LUNGS: Normal breath sounds bilaterally, no wheezing, rales,rhonchi or crepitation. No use of accessory muscles of respiration.  CARDIOVASCULAR: S1, S2 normal. No murmurs, rubs, or gallops.  ABDOMEN: Soft, nontender, nondistended. Bowel sounds present. No organomegaly or mass.  EXTREMITIES: positive for pedal edema, no cyanosis, or clubbing. Edema present on lower abdomen with some scratch marks. Abdomen is swollen and hanging down all the way up to her mid to lower thighs. NEUROLOGIC: Cranial nerves II through XII are intact. Muscle strength 5/5 in all extremities. Sensation intact. Gait not checked.  PSYCHIATRIC: The patient is alert and oriented x 3.  SKIN: No obvious rash, lesion, or ulcer.   LABORATORY PANEL:   CBC  Recent Labs Lab 03/13/15 1217  WBC 10.1  HGB 11.2*  HCT 36.0  PLT 232  MCV 77.8*  MCH 24.1*  MCHC 31.0*  RDW 19.5*   ------------------------------------------------------------------------------------------------------------------  Chemistries   Recent Labs Lab 03/13/15 1217  NA 136  K 3.7  CL 98*  CO2 31  GLUCOSE 109*  BUN 10  CREATININE 0.58  CALCIUM 8.6*    ------------------------------------------------------------------------------------------------------------------ estimated creatinine clearance is 80.9 mL/min (by C-G formula based on Cr of 0.58). ------------------------------------------------------------------------------------------------------------------ No results for input(s): TSH, T4TOTAL, T3FREE, THYROIDAB in the last 72 hours.  Invalid input(s): FREET3   Coagulation profile No results for input(s): INR, PROTIME in the last 168 hours. ------------------------------------------------------------------------------------------------------------------- No results for input(s): DDIMER in the last 72 hours. -------------------------------------------------------------------------------------------------------------------  Cardiac Enzymes  Recent Labs Lab 03/13/15 1217  TROPONINI 0.06*   ------------------------------------------------------------------------------------------------------------------ Invalid input(s): POCBNP  ---------------------------------------------------------------------------------------------------------------  Urinalysis    Component Value Date/Time   COLORURINE STRAW* 03/13/2015 1702   APPEARANCEUR CLEAR* 03/13/2015 1702   LABSPEC 1.005 03/13/2015 1702   PHURINE 7.0 03/13/2015 1702   GLUCOSEU NEGATIVE 03/13/2015 1702   HGBUR NEGATIVE 03/13/2015 1702   BILIRUBINUR NEGATIVE 03/13/2015 1702   KETONESUR NEGATIVE 03/13/2015 1702   PROTEINUR NEGATIVE 03/13/2015 1702   NITRITE NEGATIVE 03/13/2015 1702   LEUKOCYTESUR 1+* 03/13/2015 1702     RADIOLOGY: Dg Chest  2 View  03/13/2015  CLINICAL DATA:  Worsening shortness of breath and chest tightness for 1 week. EXAM: CHEST  2 VIEW COMPARISON:  07/17/2014 FINDINGS: The heart is mildly enlarged but stable. Stable tortuosity and calcification of the thoracic aorta. Mild vascular congestion without overt pulmonary edema. Small left pleural effusion and  left basilar atelectasis. No definite infiltrates. The bony thorax is intact. IMPRESSION: Cardiac enlargement and vascular congestion without overt pulmonary edema. Small left pleural effusion and overlying atelectasis. Electronically Signed   By: Rudie Meyer M.D.   On: 03/13/2015 12:45    EKG: Orders placed or performed during the hospital encounter of 03/13/15  . ED EKG  . ED EKG    IMPRESSION AND PLAN:  * Anasarca and congestive heart failure   her BNP level is not elevated, but that can be falsely low because of his obesity.  She has generalized edema and finding of cardiomegaly and congestive vasculature on chest x-ray.  I will get an echocardiogram, we'll give IV Lasix.  Elevated monitoring and follow serial troponins.  Counseled about fluid and salt restriction.  Follow intake and output closely.  *   diabetes   continue glipizide and hold metformin and follow with insulin sliding scale coverage.  *  hyperlipidemia   continue statin  * Gastroesophageal reflux disease  Continue Protonix.     All the records are reviewed and case discussed with ED provider. Management plans discussed with the patient, family and they are in agreement.  CODE STATUS: Code Status History    Date Active Date Inactive Code Status Order ID Comments User Context   07/24/2014  5:53 PM 07/25/2014  6:55 PM Full Code [id number]  Annice Needy, MD Inpatient       TOTAL TIME TAKING CARE OF THIS PATIENT: 50 minutes.    Altamese Dilling M.D on 03/13/2015   Between 7am to 6pm - Pager - [phone]  After 6pm go to [email or web address] - password [email or web address] [facility]  [place] [facility]  Office  [phone]  CC: Primary care physician; Lennart Pall, MD   Note: This dictation was prepared with Dragon dictation along with smaller phrase technology. Any transcriptional errors that result from this process are unintentional.

## 2015-03-13 NOTE — ED Notes (Signed)
Pt presents with lower leg swelling and states fluid is seeping from her abd for about two mths. Pt states is hard for her to breath.  Pt noted to have fluid seeping from her abd in triage, pt also states she is urinating very little.

## 2015-03-13 NOTE — ED Notes (Signed)
Patient transported to X-ray 

## 2015-03-14 ENCOUNTER — Inpatient Hospital Stay: Payer: Medicare Other

## 2015-03-14 ENCOUNTER — Inpatient Hospital Stay
Admit: 2015-03-14 | Discharge: 2015-03-14 | Disposition: A | Discharge status: Home / Self Care | Payer: Medicare Other | Attending: Internal Medicine | Admitting: Internal Medicine

## 2015-03-14 LAB — CBC
HCT: 32.1 % — ABNORMAL LOW (ref 35.0–47.0)
HEMOGLOBIN: 10 g/dL — AB (ref 12.0–16.0)
MCH: 23.4 pg — ABNORMAL LOW (ref 26.0–34.0)
MCHC: 31.1 g/dL — ABNORMAL LOW (ref 32.0–36.0)
MCV: 75.3 fL — AB (ref 80.0–100.0)
PLATELETS: 193 10*3/uL (ref 150–440)
RBC: 4.27 MIL/uL (ref 3.80–5.20)
RDW: 19.4 % — ABNORMAL HIGH (ref 11.5–14.5)
WBC: 8.8 10*3/uL (ref 3.6–11.0)

## 2015-03-14 LAB — GLUCOSE, CAPILLARY
GLUCOSE-CAPILLARY: 92 mg/dL (ref 65–99)
GLUCOSE-CAPILLARY: 165 mg/dL — AB (ref 65–99)
Glucose-Capillary: 167 mg/dL — ABNORMAL HIGH (ref 65–99)
Glucose-Capillary: 200 mg/dL — ABNORMAL HIGH (ref 65–99)

## 2015-03-14 LAB — BASIC METABOLIC PANEL
ANION GAP: 9 (ref 5–15)
BUN: 9 mg/dL (ref 6–20)
CALCIUM: 8.5 mg/dL — AB (ref 8.9–10.3)
CHLORIDE: 99 mmol/L — AB (ref 101–111)
CO2: 31 mmol/L (ref 22–32)
CREATININE: 0.59 mg/dL (ref 0.44–1.00)
GFR calc non Af Amer: >60 mL/min (ref >60)
Glucose, Bld: 140 mg/dL — ABNORMAL HIGH (ref 65–99)
Potassium: 3.9 mmol/L (ref 3.5–5.1)
SODIUM: 139 mmol/L (ref 135–145)

## 2015-03-14 LAB — TROPONIN I: Troponin I: 0.05 ng/mL — ABNORMAL HIGH (ref <0.031)

## 2015-03-14 MED ORDER — FUROSEMIDE 10 MG/ML IJ SOLN
40.0000 mg | Freq: Three times a day (TID) | INTRAMUSCULAR | Status: DC
  Administered 2015-03-14 – 2015-03-15 (×3): 40 mg via INTRAVENOUS
  Filled 2015-03-14 (×3): qty 4

## 2015-03-14 MED ORDER — LORAZEPAM 1 MG PO TABS
1.0000 mg | ORAL_TABLET | Freq: Three times a day (TID) | ORAL | Status: DC | PRN
Start: 2015-03-14 — End: 2015-03-15

## 2015-03-14 MED ORDER — ZOLPIDEM TARTRATE 5 MG PO TABS
5.0000 mg | ORAL_TABLET | Freq: Once | ORAL | Status: AC
Start: 2015-03-14 — End: 2015-03-14
  Administered 2015-03-14: 5 mg via ORAL
  Filled 2015-03-14: qty 1

## 2015-03-14 MED ORDER — LORAZEPAM 1 MG PO TABS
1.0000 mg | ORAL_TABLET | ORAL | Status: DC | PRN
  Administered 2015-03-14: 1 mg via ORAL
  Filled 2015-03-14: qty 1

## 2015-03-14 MED ORDER — FUROSEMIDE 10 MG/ML IJ SOLN
20.0000 mg | Freq: Once | INTRAMUSCULAR | Status: AC
  Administered 2015-03-14: 20 mg via INTRAVENOUS
  Filled 2015-03-14: qty 2

## 2015-03-14 NOTE — Progress Notes (Signed)
Pt complaining of SOB, and feeling like her oxygen tubing is suffocating her. O2 saturations 100%, lung sounds are clear. Oxygen tubing loosened up. Pt still has c/o suffocation but does not wish to take the oxygen off. Pt did state that she does have some anxiety issues. MD Sheryle Hail notified. Order given for Ativan 1 mg. Will continue to monitor.   Mayra Neer M

## 2015-03-14 NOTE — Progress Notes (Signed)
Pt resting comfortably in bed. Pt seems more calm since given ativan. Pt states that she no longer feels like she is suffocating and can breath better. Will continue to monitor.   Mayra Neer M

## 2015-03-14 NOTE — Progress Notes (Addendum)
[facility] - [place] at [facility]   PATIENT NAME: [name]    MR#:  [id number]  DATE OF BIRTH:  02-11-1946  SUBJECTIVE:   Came in with increasing sob and weight gain with leg edema REVIEW OF SYSTEMS:   Review of Systems  Constitutional: Negative for fever, chills and weight loss.  HENT: Negative for ear discharge, ear pain and nosebleeds.   Eyes: Negative for blurred vision, pain and discharge.  Respiratory: Positive for shortness of breath. Negative for sputum production, wheezing and stridor.   Cardiovascular: Positive for leg swelling. Negative for chest pain, palpitations, orthopnea and PND.  Gastrointestinal: Negative for nausea, vomiting, abdominal pain and diarrhea.  Genitourinary: Negative for urgency and frequency.  Musculoskeletal: Negative for back pain and joint pain.  Neurological: Positive for weakness. Negative for sensory change, speech change and focal weakness.  Psychiatric/Behavioral: Negative for depression and hallucinations. The patient is not nervous/anxious.   All other systems reviewed and are negative.  Tolerating Diet:yes Tolerating PT: pending  DRUG ALLERGIES:   Allergies  Allergen Reactions  . Codeine Other (See Comments)    Reaction:  Dizziness   . Wheat Bran Other (See Comments)    Reaction:  Sneezing     VITALS:  Blood pressure 131/56, pulse 85, temperature 97.9 F (36.6 C), temperature source Oral, resp. rate 20, height 5\' 2"  (1.575 m), weight 128.005 kg (282 lb 3.2 oz), SpO2 99 %.  PHYSICAL EXAMINATION:   Physical Exam  GENERAL:  70 y.o.-year-old patient lying in the bed with no acute distress.  EYES: Pupils equal, round, reactive to light and accommodation. No scleral icterus. Extraocular muscles intact.  HEENT: Head atraumatic, normocephalic. Oropharynx and nasopharynx clear.  NECK:  Supple, no jugular venous distention. No thyroid enlargement, no tenderness.  LUNGS: Normal breath sounds bilaterally,  no wheezing, rales, rhonchi. No use of accessory muscles of respiration.  CARDIOVASCULAR: S1, S2 normal. No murmurs, rubs, or gallops.  ABDOMEN: Soft, nontender, nondistended. Bowel sounds present. No organomegaly or mass. abd wall edema. Severe obesity  EXTREMITIES: No cyanosis, clubbing o, +++edema b/l.    NEUROLOGIC: Cranial nerves II through XII are intact. No focal Motor or sensory deficits b/l.   PSYCHIATRIC: The patient is alert and oriented x 3.  SKIN: No obvious rash, lesion, or ulcer.   LABORATORY PANEL:  CBC  Recent Labs Lab 03/14/15 0118  WBC 8.8  HGB 10.0*  HCT 32.1*  PLT 193    Chemistries   Recent Labs Lab 03/14/15 0118  NA 139  K 3.9  CL 99*  CO2 31  GLUCOSE 140*  BUN 9  CREATININE 0.59  CALCIUM 8.5*    Cardiac Enzymes  Recent Labs Lab 03/14/15 0118  TROPONINI 0.05*   RADIOLOGY:  Dg Chest 2 View  03/14/2015  CLINICAL DATA:  Pulmonary edema, history hypertension. EXAM: CHEST  2 VIEW COMPARISON:  Radiograph 03/13/2015 FINDINGS: Stable enlarged cardiac silhouette. There is interval increase in linear interstitial markings throughout the lungs. Small LEFT effusion similar. No focal consolidation. IMPRESSION: Increasing interstitial edema pattern. Electronically Signed   By: 03/15/2015 M.D.   On: 03/14/2015 11:56   Dg Chest 2 View  03/13/2015  CLINICAL DATA:  Worsening shortness of breath and chest tightness for 1 week. EXAM: CHEST  2 VIEW COMPARISON:  07/17/2014 FINDINGS: The heart is mildly enlarged but stable. Stable tortuosity and calcification of the thoracic aorta. Mild vascular congestion without overt pulmonary edema. Small left pleural effusion and left basilar atelectasis. No  definite infiltrates. The bony thorax is intact. IMPRESSION: Cardiac enlargement and vascular congestion without overt pulmonary edema. Small left pleural effusion and overlying atelectasis. Electronically Signed   By: Rudie Meyer M.D.   On: 03/13/2015 12:45    ASSESSMENT AND PLAN:   [name] is a 70 y.o. female with a known history of hypertension, peripheral vascular disease, shortness of breath, asthma, diabetes, - for last 1 week has gradual worsening in her legs swelling which is all the way up to her abdominal wall and she also started losing some fluid from her abdominal wall  * Anasarca and congestive heart failure, new onset Echo pending _IV lasix 40 mg tid -restrict po fluid intake -her BNP level is not elevated, but that can be falsely low because of his obesity. She has generalized edema and finding of cardiomegaly and congestive vasculature on chest x-ray. Elevated monitoring and follow serial troponins. Counseled about fluid and salt restriction.  * diabetes  continue glipizide and hold metformin and follow with insulin sliding scale coverage.  * hyperlipidemia  continue statin  * Gastroesophageal reflux disease Continue Protonix.  Case discussed with Care Management/Social Worker. Management plans discussed with the patient, family and they are in agreement.  CODE STATUS: full  DVT Prophylaxis:lovenox  TOTAL TIME TAKING CARE OF THIS PATIENT:30 minutes.  >50% time spent on counselling and coordination of care pt and RN  POSSIBLE D/C IN 1 DAYS, DEPENDING ON CLINICAL CONDITION.  Note: This dictation was prepared with Dragon dictation along with smaller phrase technology. Any transcriptional errors that result from this process are unintentional.  Vaudine Dutan M.D on 03/14/2015 at 12:23 PM  Between 7am to 6pm - Pager - [phone]  After 6pm go to [email or web address] - password [email or web address] [facility]  [place] [facility]  Office  [phone]  CC: Primary care physician; Lennart Pall, MD

## 2015-03-14 NOTE — Progress Notes (Signed)
Patient has been alert and oriented. Vitals are stable. Sinus rhythm on tele. IV lasix continued with good urine output. Patient states she notices a difference in her swelling. Only complaint has been some chronic leg pain and that she cannot get comfortable in the bed. Has been up to the chair today and is ambulating in the room with her walker from home well. Will continue to monitor.

## 2015-03-14 NOTE — Progress Notes (Signed)
Dr. Allena Katz has changed patient's lasix dose to 40mg  every 8 hours. Night shift gave 20mg  this AM. MD stated to give another 20mg  now. Will add order and give once verified by pharmacist.

## 2015-03-14 NOTE — Progress Notes (Signed)
Pt c/o about not being able to sleep. MD Vachhani notified. Order given for one time dose of Ambien 5 mg. Will administer with nighttime medications. Will continue to monitor.   Mayra Neer M

## 2015-03-15 LAB — BASIC METABOLIC PANEL
ANION GAP: 9 (ref 5–15)
BUN: 10 mg/dL (ref 6–20)
CALCIUM: 8.3 mg/dL — AB (ref 8.9–10.3)
CHLORIDE: 98 mmol/L — AB (ref 101–111)
CO2: 31 mmol/L (ref 22–32)
Creatinine, Ser: 0.6 mg/dL (ref 0.44–1.00)
GFR calc non Af Amer: >60 mL/min (ref >60)
Glucose, Bld: 109 mg/dL — ABNORMAL HIGH (ref 65–99)
Potassium: 3.5 mmol/L (ref 3.5–5.1)
SODIUM: 138 mmol/L (ref 135–145)

## 2015-03-15 LAB — GLUCOSE, CAPILLARY
GLUCOSE-CAPILLARY: 106 mg/dL — AB (ref 65–99)
GLUCOSE-CAPILLARY: 157 mg/dL — AB (ref 65–99)

## 2015-03-15 MED ORDER — POTASSIUM CHLORIDE CRYS ER 20 MEQ PO TBCR: 40.0000 meq | EXTENDED_RELEASE_TABLET | Freq: Every day | ORAL | Status: DC

## 2015-03-15 MED ORDER — FUROSEMIDE 40 MG PO TABS: 40.0000 mg | ORAL_TABLET | Freq: Two times a day (BID) | ORAL | Status: DC

## 2015-03-15 MED ORDER — POTASSIUM CHLORIDE CRYS ER 20 MEQ PO TBCR
40.0000 meq | EXTENDED_RELEASE_TABLET | Freq: Every day | ORAL | Status: DC
Start: 2015-03-15 — End: 2015-03-15
  Administered 2015-03-15: 40 meq via ORAL
  Filled 2015-03-15: qty 2

## 2015-03-15 NOTE — Discharge Instructions (Signed)
LOW SALT DIET HOME OXYGEN 2liters /min

## 2015-03-15 NOTE — Progress Notes (Signed)
Pt sats 97% on RA while sitting in recliner. When ambulating the patient, saturations dropped into the 80's with a low of 84%. Once pt returned to the room, and sat on side of bed. Saturations went to 92%. Will continue to monitor.   Mayra Neer M

## 2015-03-15 NOTE — Care Management Note (Signed)
Case Management Note  Patient Details  Name: TREZURE CRONK MRN: [id number] Date of Birth: 12/31/1945  Subjective/Objective:  Faxed order for new oxygen to [facility].  Requested that a portable tank be delivered to Ms Paschall in room 249 today and then home oxygen be setup.                 Action/Plan:   Expected Discharge Date:                  Expected Discharge Plan:     In-House Referral:     Discharge planning Services     Post Acute Care Choice:    Choice offered to:     DME Arranged:    DME Agency:     HH Arranged:    HH Agency:     Status of Service:     Medicare Important Message Given:    Date Medicare IM Given:    Medicare IM give by:    Date Additional Medicare IM Given:    Additional Medicare Important Message give by:     If discussed at Long Length of Stay Meetings, dates discussed:    Additional Comments:  Lexianna Weinrich A, RN 03/15/2015, 10:49 AM

## 2015-03-15 NOTE — Progress Notes (Signed)
Oxygen has arrived for patient to take home. Discharge instructions given. IV and tele removed. Prescriptions given to patient along with education on furosemide. Patient will follow up with PCP. Instructed to eat low salt diet and to decrease fluid intake. No questions at this time.

## 2015-03-15 NOTE — Progress Notes (Signed)
SATURATION QUALIFICATIONS: (This note is used to comply with regulatory documentation for home oxygen)  Patient Saturations on Room Air at Rest = 97%  Patient Saturations on Room Air while Ambulating = 84%  Patient Saturations on 2 Liters of oxygen while Ambulating =89 %  Please briefly explain why patient needs home oxygen:  Asthma, dyspnea due to CHF, acute CHF new onset

## 2015-03-15 NOTE — Discharge Summary (Signed)
[facility] - [place] at [facility]   PATIENT NAME: [name]    MR#:  [id number]  DATE OF BIRTH:  [birth year]  DATE OF ADMISSION:  03/13/2015 ADMITTING PHYSICIAN: Altamese Dilling, MD  DATE OF DISCHARGE: 03/15/15  PRIMARY CARE PHYSICIAN: Lennart Pall, MD    ADMISSION DIAGNOSIS:  Anasarca [R60.1] Pulmonary edema [J81.1] Acute congestive heart failure, unspecified congestive heart failure type (HCC) [I50.9]  DISCHARGE DIAGNOSIS:  Acute  CHF (EF unknown)-echo results pending Chronic leg edema Morbid obesity Hypoxia now on home oxygen SECONDARY DIAGNOSIS:   Past Medical History  Diagnosis Date  . Hypertension   . Heart murmur   . Peripheral vascular disease (HCC)   . Shortness of breath dyspnea   . Asthma   . Diabetes mellitus without complication (HCC)   . GERD (gastroesophageal reflux disease)   . History of hiatal hernia   . Restless leg syndrome   . Panic attacks     HOSPITAL COURSE:   [name] is a 70 y.o. female with a known history of hypertension, peripheral vascular disease, shortness of breath, asthma, diabetes, - for last 1 week has gradual worsening in her legs swelling which is all the way up to her abdominal wall and she also started losing some fluid from her abdominal wall  * Anasarca and congestive heart failure, new onset Echo done results pending _IV lasix 40 mg tid--uop 2100 cc -change to po lasix 40 mg bid and give KCL 40 meq daily -restrict po fluid intake -her BNP level is not elevated, but that can be falsely low because of his obesity. She has generalized edema and finding of cardiomegaly and congestive vasculature on chest x-ray. Counseled about fluid and salt restriction. She will f/u out tp on Friday with her PCP and can review echo resutls.  * hypoxia due to chf -qualifies for home oxygen which will be set up at d/c  *diabetes  continue glipizide and hold metformin and follow with insulin  sliding scale coverage.  * hyperlipidemia  continue statin  * Gastroesophageal reflux disease Continue Protonix.  *morbid obesity -pt advised to loose some weight!  D/c home with oxygen  CONSULTS OBTAINED:     DRUG ALLERGIES:   Allergies  Allergen Reactions  . Codeine Other (See Comments)    Reaction:  Dizziness   . Wheat Bran Other (See Comments)    Reaction:  Sneezing     DISCHARGE MEDICATIONS:   Current Discharge Medication List    START taking these medications   Details  furosemide (LASIX) 40 MG tablet Take 1 tablet (40 mg total) by mouth 2 (two) times daily. Qty: 60 tablet, Refills: 2    potassium chloride SA (K-DUR,KLOR-CON) 20 MEQ tablet Take 2 tablets (40 mEq total) by mouth daily. Qty: 30 tablet, Refills: 0      CONTINUE these medications which have NOT CHANGED   Details  aspirin EC 81 MG tablet Take 81 mg by mouth daily.    gabapentin (NEURONTIN) 100 MG capsule Take 200-300 mg by mouth at bedtime as needed (for pain).     glimepiride (AMARYL) 2 MG tablet Take 2 mg by mouth 2 (two) times daily.    hydrochlorothiazide (HYDRODIURIL) 12.5 MG tablet Take 12.5 mg by mouth daily.    loratadine (CLARITIN) 10 MG tablet Take 10 mg by mouth daily.    lovastatin (MEVACOR) 20 MG tablet Take 20 mg by mouth at bedtime.    metFORMIN (GLUCOPHAGE-XR) 500 MG 24 hr tablet  Take 1,000 mg by mouth 2 (two) times daily.    omeprazole (PRILOSEC) 40 MG capsule Take 40 mg by mouth at bedtime.     oxyCODONE-acetaminophen (PERCOCET/ROXICET) 5-325 MG tablet Take 1-2 tablets by mouth every 4 (four) hours as needed for severe pain.        If you experience worsening of your admission symptoms, develop shortness of breath, life threatening emergency, suicidal or homicidal thoughts you must seek medical attention immediately by calling 911 or calling your MD immediately  if symptoms less severe.  You Must read complete instructions/literature along with all the possible  adverse reactions/side effects for all the Medicines you take and that have been prescribed to you. Take any new Medicines after you have completely understood and accept all the possible adverse reactions/side effects.   Please note  You were cared for by a hospitalist during your hospital stay. If you have any questions about your discharge medications or the care you received while you were in the hospital after you are discharged, you can call the unit and asked to speak with the hospitalist on call if the hospitalist that took care of you is not available. Once you are discharged, your primary care physician will handle any further medical issues. Please note that NO REFILLS for any discharge medications will be authorized once you are discharged, as it is imperative that you return to your primary care physician (or establish a relationship with a primary care physician if you do not have one) for your aftercare needs so that they can reassess your need for medications and monitor your lab values. Today   SUBJECTIVE   I am dying to go home. Feels better after diuresis Breathing better  VITAL SIGNS:  Blood pressure 138/55, pulse 84, temperature 98 F (36.7 C), temperature source Oral, resp. rate 20, height 5\' 2"  (1.575 m), weight 125.193 kg (276 lb), SpO2 100 %.  I/O:   Intake/Output Summary (Last 24 hours) at 03/15/15 1020 Last data filed at 03/15/15 0937  Gross per 24 hour  Intake    600 ml  Output   1950 ml  Net  -1350 ml    PHYSICAL EXAMINATION:  GENERAL:  70 y.o.-year-old patient lying in the bed with no acute distress. obesity EYES: Pupils equal, round, reactive to light and accommodation. No scleral icterus. Extraocular muscles intact.  HEENT: Head atraumatic, normocephalic. Oropharynx and nasopharynx clear.  NECK:  Supple, no jugular venous distention. No thyroid enlargement, no tenderness.  LUNGS: Normal breath sounds bilaterally, no wheezing, rales,rhonchi or  crepitation. No use of accessory muscles of respiration.  CARDIOVASCULAR: S1, S2 normal. No murmurs, rubs, or gallops.  ABDOMEN: Soft, non-tender, non-distended. Bowel sounds present. No organomegaly or mass. Severe obesity EXTREMITIES: ++pedal edema, cyanosis, or clubbing.  NEUROLOGIC: Cranial nerves II through XII are intact. Muscle strength 5/5 in all extremities. Sensation intact. Gait not checked.  PSYCHIATRIC:  patient is alert and oriented x 3.  SKIN: No obvious rash, lesion, or ulcer.   DATA REVIEW:   CBC   Recent Labs Lab 03/14/15 0118  WBC 8.8  HGB 10.0*  HCT 32.1*  PLT 193    Chemistries   Recent Labs Lab 03/15/15 0518  NA 138  K 3.5  CL 98*  CO2 31  GLUCOSE 109*  BUN 10  CREATININE 0.60  CALCIUM 8.3*    Microbiology Results   No results found for this or any previous visit (from the past 240 hour(s)).  RADIOLOGY:  Dg Chest  2 View  03/14/2015  CLINICAL DATA:  Pulmonary edema, history hypertension. EXAM: CHEST  2 VIEW COMPARISON:  Radiograph 03/13/2015 FINDINGS: Stable enlarged cardiac silhouette. There is interval increase in linear interstitial markings throughout the lungs. Small LEFT effusion similar. No focal consolidation. IMPRESSION: Increasing interstitial edema pattern. Electronically Signed   By: Genevive Bi M.D.   On: 03/14/2015 11:56   Dg Chest 2 View  03/13/2015  CLINICAL DATA:  Worsening shortness of breath and chest tightness for 1 week. EXAM: CHEST  2 VIEW COMPARISON:  07/17/2014 FINDINGS: The heart is mildly enlarged but stable. Stable tortuosity and calcification of the thoracic aorta. Mild vascular congestion without overt pulmonary edema. Small left pleural effusion and left basilar atelectasis. No definite infiltrates. The bony thorax is intact. IMPRESSION: Cardiac enlargement and vascular congestion without overt pulmonary edema. Small left pleural effusion and overlying atelectasis. Electronically Signed   By: Rudie Meyer M.D.   On:  03/13/2015 12:45     Management plans discussed with the patient, family and they are in agreement.  CODE STATUS:     Code Status Orders        Start     Ordered   03/13/15 1904  Full code   Continuous     03/13/15 1903    Code Status History    Date Active Date Inactive Code Status Order ID Comments User Context   07/24/2014  5:53 PM 07/25/2014  6:55 PM Full Code [id number]  Annice Needy, MD Inpatient      TOTAL TIME TAKING CARE OF THIS PATIENT: 40 minutes.    Casilda Pickerill M.D on 03/15/2015 at 10:20 AM  Between 7am to 6pm - Pager - [phone] After 6pm go to [email or web address] - password [email or web address]  [place] [facility]  Office  [phone]  CC: Primary care physician; Lennart Pall, MD

## 2015-03-31 ENCOUNTER — Other Ambulatory Visit: Payer: Self-pay | Admitting: Family Medicine

## 2015-03-31 ENCOUNTER — Ambulatory Visit
Admission: RE | Admit: 2015-03-31 | Discharge: 2015-03-31 | Disposition: A | Discharge status: Home / Self Care | Payer: Medicare Other | Source: Ambulatory Visit | Attending: Family Medicine | Admitting: Family Medicine

## 2015-03-31 DIAGNOSIS — L539 Erythematous condition, unspecified: Secondary | ICD-10-CM | POA: Diagnosis present

## 2015-03-31 DIAGNOSIS — R609 Edema, unspecified: Secondary | ICD-10-CM | POA: Diagnosis present

## 2015-04-02 ENCOUNTER — Other Ambulatory Visit: Payer: Self-pay | Admitting: Family Medicine

## 2015-04-02 DIAGNOSIS — L539 Erythematous condition, unspecified: Secondary | ICD-10-CM

## 2015-04-21 ENCOUNTER — Other Ambulatory Visit (HOSPITAL_COMMUNITY): Payer: Self-pay | Admitting: Family Medicine

## 2015-04-21 DIAGNOSIS — R740 Nonspecific elevation of levels of transaminase and lactic acid dehydrogenase [LDH]: Principal | ICD-10-CM

## 2015-04-21 DIAGNOSIS — R7401 Elevation of levels of liver transaminase levels: Secondary | ICD-10-CM

## 2015-04-23 ENCOUNTER — Other Ambulatory Visit (HOSPITAL_COMMUNITY): Payer: Medicare Other

## 2015-04-23 ENCOUNTER — Ambulatory Visit (HOSPITAL_COMMUNITY)
Admission: RE | Admit: 2015-04-23 | Discharge: 2015-04-23 | Disposition: A | Discharge status: Home / Self Care | Payer: Medicare Other | Source: Ambulatory Visit | Attending: Family Medicine | Admitting: Family Medicine

## 2015-04-23 DIAGNOSIS — I10 Essential (primary) hypertension: Secondary | ICD-10-CM | POA: Insufficient documentation

## 2015-04-23 DIAGNOSIS — E1151 Type 2 diabetes mellitus with diabetic peripheral angiopathy without gangrene: Secondary | ICD-10-CM | POA: Insufficient documentation

## 2015-04-23 DIAGNOSIS — K8020 Calculus of gallbladder without cholecystitis without obstruction: Secondary | ICD-10-CM | POA: Diagnosis not present

## 2015-04-23 DIAGNOSIS — R7401 Elevation of levels of liver transaminase levels: Secondary | ICD-10-CM

## 2015-04-23 DIAGNOSIS — R740 Nonspecific elevation of levels of transaminase and lactic acid dehydrogenase [LDH]: Secondary | ICD-10-CM | POA: Diagnosis not present

## 2015-04-23 DIAGNOSIS — R932 Abnormal findings on diagnostic imaging of liver and biliary tract: Secondary | ICD-10-CM | POA: Diagnosis not present

## 2015-05-28 ENCOUNTER — Ambulatory Visit
Admission: RE | Admit: 2015-05-28 | Discharge: 2015-05-28 | Disposition: A | Discharge status: Home / Self Care | Payer: Medicare Other | Source: Ambulatory Visit | Attending: Family Medicine | Admitting: Family Medicine

## 2015-05-28 DIAGNOSIS — Z1231 Encounter for screening mammogram for malignant neoplasm of breast: Secondary | ICD-10-CM | POA: Diagnosis present

## 2015-07-31 ENCOUNTER — Encounter: Payer: Self-pay | Admitting: Vascular Surgery

## 2015-08-31 ENCOUNTER — Emergency Department
Admission: EM | Admit: 2015-08-31 | Discharge: 2015-08-31 | Disposition: A | Discharge status: Home / Self Care | Payer: Medicare Other | Attending: Emergency Medicine | Admitting: Emergency Medicine

## 2015-08-31 ENCOUNTER — Encounter: Payer: Self-pay | Admitting: Emergency Medicine

## 2015-08-31 DIAGNOSIS — I110 Hypertensive heart disease with heart failure: Secondary | ICD-10-CM | POA: Diagnosis not present

## 2015-08-31 DIAGNOSIS — Z7982 Long term (current) use of aspirin: Secondary | ICD-10-CM | POA: Diagnosis not present

## 2015-08-31 DIAGNOSIS — Z8669 Personal history of other diseases of the nervous system and sense organs: Secondary | ICD-10-CM | POA: Insufficient documentation

## 2015-08-31 DIAGNOSIS — E119 Type 2 diabetes mellitus without complications: Secondary | ICD-10-CM | POA: Insufficient documentation

## 2015-08-31 DIAGNOSIS — B372 Candidiasis of skin and nail: Secondary | ICD-10-CM

## 2015-08-31 DIAGNOSIS — L8992 Pressure ulcer of unspecified site, stage 2: Secondary | ICD-10-CM

## 2015-08-31 DIAGNOSIS — Z791 Long term (current) use of non-steroidal anti-inflammatories (NSAID): Secondary | ICD-10-CM | POA: Insufficient documentation

## 2015-08-31 DIAGNOSIS — Z79899 Other long term (current) drug therapy: Secondary | ICD-10-CM | POA: Insufficient documentation

## 2015-08-31 DIAGNOSIS — L89302 Pressure ulcer of unspecified buttock, stage 2: Secondary | ICD-10-CM | POA: Diagnosis not present

## 2015-08-31 DIAGNOSIS — J45909 Unspecified asthma, uncomplicated: Secondary | ICD-10-CM | POA: Insufficient documentation

## 2015-08-31 DIAGNOSIS — I509 Heart failure, unspecified: Secondary | ICD-10-CM | POA: Insufficient documentation

## 2015-08-31 DIAGNOSIS — Z7984 Long term (current) use of oral hypoglycemic drugs: Secondary | ICD-10-CM | POA: Insufficient documentation

## 2015-08-31 DIAGNOSIS — L989 Disorder of the skin and subcutaneous tissue, unspecified: Secondary | ICD-10-CM | POA: Diagnosis present

## 2015-08-31 NOTE — ED Notes (Signed)
Pt with a blistered rash to her buttocks, private area and under her abdominal folds  Blistered sores - some has ruptured present  Pt has been using nystatin powder for a few days with no relief

## 2015-08-31 NOTE — ED Provider Notes (Signed)
[facility] Emergency Department Provider Note  ____________________________________________  Time seen: 10:30 AM  I have reviewed the triage vital signs and the nursing notes.   HISTORY  Chief Complaint skin lesions     HPI [name] is a 70 y.o. female who complains of chronic skin lesions for the past 2 months. She had diffuse lesions all over her trunk and extremities that started as blisters or fluid collections and then ruptured, she is been treated by primary care and dermatology for these and have now resolved. However, over the last few weeks she also has skin ulcerations developing around the perineum. She started nystatin powder 2 days ago and fluconazole tablets one week ago for this, but comes to the ED today wondering if anything else can be done. She is aware that her diabetes limits her ability to heal wounds. No fever chills abdominal pain chest pain shortness of breath dizziness or syncope.     Past Medical History  Diagnosis Date  . Hypertension   . Heart murmur   . Peripheral vascular disease (HCC)   . Shortness of breath dyspnea   . Asthma   . Diabetes mellitus without complication (HCC)   . GERD (gastroesophageal reflux disease)   . History of hiatal hernia   . Restless leg syndrome   . Panic attacks      Patient Active Problem List   Diagnosis Date Noted  . Generalized edema 03/13/2015  . CHF (congestive heart failure) (HCC) 03/13/2015  . Carotid stenosis 07/24/2014     Past Surgical History  Procedure Laterality Date  . Abdominal hysterectomy    . Fracture surgery Left     fractured ankle  . Tonsillectomy    . Eye surgery Left     cataract  . Breast biopsy Right yrs ago    benign  . Endarterectomy Right 07/24/2014    Procedure: ENDARTERECTOMY CAROTID;  Surgeon: Annice Needy, MD;  Location: [facility] ORS;  Service: Vascular;  Laterality: Right;     Current Outpatient Rx  Name  Route  Sig  Dispense  Refill  .  aspirin EC 81 MG tablet   Oral   Take 81 mg by mouth daily.         . fluconazole (DIFLUCAN) 150 MG tablet   Oral   Take 150 mg by mouth every Wednesday. For 3 weeks         . furosemide (LASIX) 40 MG tablet   Oral   Take 1 tablet (40 mg total) by mouth 2 (two) times daily.   60 tablet   2   . gabapentin (NEURONTIN) 800 MG tablet   Oral   Take 800 mg by mouth 2 (two) times daily.         Marland Kitchen glimepiride (AMARYL) 2 MG tablet   Oral   Take 2 mg by mouth 2 (two) times daily.         . hydrochlorothiazide (HYDRODIURIL) 12.5 MG tablet   Oral   Take 12.5 mg by mouth daily.         Marland Kitchen lovastatin (MEVACOR) 20 MG tablet   Oral   Take 20 mg by mouth at bedtime.         . metFORMIN (GLUCOPHAGE-XR) 500 MG 24 hr tablet   Oral   Take 1,000 mg by mouth 2 (two) times daily.         . naproxen (NAPROSYN) 500 MG tablet   Oral   Take 500 mg  by mouth daily.         Marland Kitchen nystatin (MYCOSTATIN/NYSTOP) powder   Topical   Apply topically 2 (two) times daily.         Marland Kitchen omeprazole (PRILOSEC) 40 MG capsule   Oral   Take 40 mg by mouth at bedtime.          . sertraline (ZOLOFT) 25 MG tablet   Oral   Take 25 mg by mouth daily.         Marland Kitchen triamcinolone cream (KENALOG) 0.1 %   Topical   Apply topically 2 (two) times daily.         . potassium chloride SA (K-DUR,KLOR-CON) 20 MEQ tablet   Oral   Take 2 tablets (40 mEq total) by mouth daily. Patient not taking: Reported on 08/31/2015   30 tablet   0      Allergies Codeine and Wheat bran   Family History  Problem Relation Age of Onset  . Pancreatic cancer Mother   . CAD Father   . Hypertension Father   . Pancreatic cancer Father   . CAD Brother   . Breast cancer Maternal Aunt     pt states several maternal aunts    Social History Social History  Substance Use Topics  . Smoking status: Never Smoker   . Smokeless tobacco: Never Used  . Alcohol Use: No    Review of Systems  Constitutional:   No fever  or chills.  ENT:  No throat swelling Cardiovascular:   No chest pain. Respiratory:   No dyspnea or cough. Gastrointestinal:   Negative for abdominal pain, vomiting and diarrhea.  Genitourinary:   Perineal ulcerations. Musculoskeletal:   Negative for focal pain or swelling 10-point ROS otherwise negative.  ____________________________________________   PHYSICAL EXAM:  VITAL SIGNS: ED Triage Vitals  Enc Vitals Group     BP 08/31/15 0943 165/71 mmHg     Pulse Rate 08/31/15 0943 69     Resp 08/31/15 0943 20     Temp 08/31/15 0943 97.7 F (36.5 C)     Temp Source 08/31/15 0943 Oral     SpO2 08/31/15 0943 95 %     Weight 08/31/15 0943 240 lb (108.863 kg)     Height 08/31/15 0943 5\' 2"  (1.575 m)     Head Cir --      Peak Flow --      Pain Score 08/31/15 0944 5     Pain Loc --      Pain Edu? --      Excl. in GC? --     Vital signs reviewed, nursing assessments reviewed.   Constitutional:   Alert and oriented. Well appearing and in no distress. Eyes:   No scleral icterus. No conjunctival pallor. PERRL. EOMI.  No nystagmus. ENT   Head:   Normocephalic and atraumatic.   Nose:   No congestion/rhinnorhea. No septal hematoma   Mouth/Throat:   MMM, no pharyngeal erythema. No peritonsillar mass. No mucosal lesions   Neck:   No stridor. No SubQ emphysema. No meningismus. Hematological/Lymphatic/Immunilogical:   No cervical lymphadenopathy. Cardiovascular:   RRR. Symmetric bilateral radial and DP pulses.  No murmurs.  Respiratory:   Normal respiratory effort without tachypnea nor retractions. Breath sounds are clear and equal bilaterally. No wheezes/rales/rhonchi. Gastrointestinal:   Soft and nontender. Non distended. There is no CVA tenderness.  No rebound, rigidity, or guarding. Genitourinary:   Genital and perineal examination performed with nurse Amy at the bedside. Few very  small superficial skin ulcerations on the external labia in the groin and underneath the  patient's extensive abdominal pannus, there are numerous superficial stage II ulcerations in the skin, consistent with pressure ulcers from mechanical trauma related to skin on skin contact. There is also apparent candidal infection in the area.  No evidence of cellulitis or abscess. No crepitus. No purulent drainage Musculoskeletal:   Nontender with normal range of motion in all extremities. No joint effusions.  No lower extremity tenderness.  No edema. Neurologic:   Normal speech and language.  CN 2-10 normal. Motor grossly intact. No gross focal neurologic deficits are appreciated.  Skin:    Skin is warm, dry and intact. No rash noted.  No petechiae, purpura, or bullae.  ____________________________________________    LABS (pertinent positives/negatives) (all labs ordered are listed, but only abnormal results are displayed) Labs Reviewed - No data to display ____________________________________________   EKG    ____________________________________________    RADIOLOGY    ____________________________________________   PROCEDURES   ____________________________________________   INITIAL IMPRESSION / ASSESSMENT AND PLAN / ED COURSE  Pertinent labs & imaging results that were available during my care of the patient were reviewed by me and considered in my medical decision making (see chart for details).  Patient well appearing no acute distress. Presents with chronic rash, concerned about the lack of clinical progress of the perineal ulcerations with 2 days of nystatin therapy. On examination, is consistent with pressure ulcers from mechanical trauma with her pannus as well as superinfection with Candida. This is likely impairing wound healing as is her diabetes and super morbid obesity. She is already on appropriate therapy with nystatin powder twice a day and fluconazole tablet. She is following up with primary care and dermatology. I encouraged her to continue with all  these therapies and follow-up plan. She is given soft dressings to help decrease pressure on the wounds. No evidence of cellulitis abscess necrotizing fasciitis hidradenitis superativa Fournier's gangrene. Not consistent with Stevens-Johnson's or TEN, pemphigus vulgaris or necrotizing infection. We'll discharge home.     ____________________________________________   FINAL CLINICAL IMPRESSION(S) / ED DIAGNOSES  Final diagnoses:  Candidiasis, cutaneous  Pressure ulcer, stage 2       Portions of this note were generated with dragon dictation software. Dictation errors may occur despite best attempts at proofreading.   Sharman Cheek, MD 08/31/15 [id number]

## 2015-08-31 NOTE — ED Notes (Signed)
Pt has had skin "sores" on her bilateral buttocks and in bilateral groin area for approximately 2 months.  She has been seen and treated, but states that it still hasn't cleared up.  States the sores begin as a "blister" then open up.

## 2015-08-31 NOTE — Discharge Instructions (Signed)
Skin Ulcer A skin ulcer is an open sore that can be shallow or deep. Skin ulcers sometimes become infected and are difficult to treat. It may be 1 month or longer before real healing progress is made. CAUSES   Injury.  Problems with the veins or arteries.  Diabetes.  Insect bites.  Bedsores.  Inflammatory conditions. SYMPTOMS   Pain, redness, swelling, and tenderness around the ulcer.  Fever.  Bleeding from the ulcer.  Yellow or clear fluid coming from the ulcer. DIAGNOSIS  There are many types of skin ulcers. Any open sores will be examined. Certain tests will be done to determine the kind of ulcer you have. The right treatment depends on the type of ulcer you have. TREATMENT  Treatment is a long-term challenge. It may include:  Wearing an elastic wrap, compression stockings, or gel cast over the ulcer area.  Taking antibiotic medicines or putting antibiotic creams on the affected area if there is an infection. HOME CARE INSTRUCTIONS  Put on your bandages (dressings), wraps, or casts over the ulcer as directed by your caregiver.  Change all dressings as directed by your caregiver.  Take all medicines as directed by your caregiver.  Keep the affected area clean and dry.  Avoid injuries to the affected area.  Eat a well-balanced, healthy diet that includes plenty of fruit and vegetables.  If you smoke, consider quitting or decreasing the amount of cigarettes you smoke.  Once the ulcer heals, get regular exercise as directed by your caregiver.  Work with your caregiver to make sure your blood pressure, cholesterol, and diabetes are well-controlled.  Keep your skin moisturized. Dry skin can crack and lead to skin ulcers. SEEK IMMEDIATE MEDICAL CARE IF:   Your pain gets worse.  You have swelling, redness, or fluids around the ulcer.  You have chills.  You have a fever. MAKE SURE YOU:   Understand these instructions.  Will watch your condition.  Will get  help right away if you are not doing well or get worse.   This information is not intended to replace advice given to you by your health care provider. Make sure you discuss any questions you have with your health care provider.   Document Released: 03/24/2004 Document Revised: 05/09/2011 Document Reviewed: 10/01/2010 Elsevier Interactive Patient Education 2016 [employer].  Cutaneous Candidiasis Cutaneous candidiasis is a condition in which there is an overgrowth of yeast (candida) on the skin. Yeast normally live on the skin, but in small enough numbers not to cause any symptoms. In certain cases, increased growth of the yeast may cause an actual yeast infection. This kind of infection usually occurs in areas of the skin that are constantly warm and moist, such as the armpits or the groin. Yeast is the most common cause of diaper rash in babies and in people who cannot control their bowel movements (incontinence). CAUSES  The fungus that most often causes cutaneous candidiasis is Candida albicans. Conditions that can increase the risk of getting a yeast infection of the skin include:  Obesity.  Pregnancy.  Diabetes.  Taking antibiotic medicine.  Taking birth control pills.  Taking steroid medicines.  Thyroid disease.  An iron or zinc deficiency.  Problems with the immune system. SYMPTOMS   Red, swollen area of the skin.  Bumps on the skin.  Itchiness. DIAGNOSIS  The diagnosis of cutaneous candidiasis is usually based on its appearance. Light scrapings of the skin may also be taken and viewed under a microscope to identify the  presence of yeast. TREATMENT  Antifungal creams may be applied to the infected skin. In severe cases, oral medicines may be needed.  HOME CARE INSTRUCTIONS   Keep your skin clean and dry.  Maintain a healthy weight.  If you have diabetes, keep your blood sugar under control. SEEK IMMEDIATE MEDICAL CARE IF:  Your rash continues to spread  despite treatment.  You have a fever, chills, or abdominal pain.   This information is not intended to replace advice given to you by your health care provider. Make sure you discuss any questions you have with your health care provider.   Document Released: 11/02/2010 Document Revised: 05/09/2011 Document Reviewed: 08/18/2014 Elsevier Interactive Patient Education [employer]

## 2015-08-31 NOTE — ED Notes (Addendum)
Per family she has been treated for abscess and blisters for about 2 months and has been on couple of antibiotics w/o change  Family also noticed that she has had some memory issues and confusion

## 2015-09-10 ENCOUNTER — Encounter: Payer: Self-pay | Admitting: General Surgery

## 2015-09-10 ENCOUNTER — Encounter: Payer: Medicare Other | Attending: General Surgery | Admitting: General Surgery

## 2015-09-10 ENCOUNTER — Other Ambulatory Visit
Admission: RE | Admit: 2015-09-10 | Discharge: 2015-09-10 | Disposition: A | Discharge status: Home / Self Care | Payer: Medicare Other | Source: Ambulatory Visit | Attending: General Surgery | Admitting: General Surgery

## 2015-09-10 DIAGNOSIS — E1140 Type 2 diabetes mellitus with diabetic neuropathy, unspecified: Secondary | ICD-10-CM | POA: Insufficient documentation

## 2015-09-10 DIAGNOSIS — E079 Disorder of thyroid, unspecified: Secondary | ICD-10-CM | POA: Insufficient documentation

## 2015-09-10 DIAGNOSIS — Z6841 Body Mass Index (BMI) 40.0 and over, adult: Secondary | ICD-10-CM | POA: Insufficient documentation

## 2015-09-10 DIAGNOSIS — E661 Drug-induced obesity: Secondary | ICD-10-CM | POA: Insufficient documentation

## 2015-09-10 DIAGNOSIS — K219 Gastro-esophageal reflux disease without esophagitis: Secondary | ICD-10-CM | POA: Insufficient documentation

## 2015-09-10 DIAGNOSIS — M1990 Unspecified osteoarthritis, unspecified site: Secondary | ICD-10-CM | POA: Insufficient documentation

## 2015-09-10 DIAGNOSIS — L03311 Cellulitis of abdominal wall: Secondary | ICD-10-CM

## 2015-09-10 DIAGNOSIS — I509 Heart failure, unspecified: Secondary | ICD-10-CM | POA: Insufficient documentation

## 2015-09-10 DIAGNOSIS — L089 Local infection of the skin and subcutaneous tissue, unspecified: Secondary | ICD-10-CM | POA: Diagnosis present

## 2015-09-10 DIAGNOSIS — I110 Hypertensive heart disease with heart failure: Secondary | ICD-10-CM | POA: Insufficient documentation

## 2015-09-10 DIAGNOSIS — E785 Hyperlipidemia, unspecified: Secondary | ICD-10-CM | POA: Insufficient documentation

## 2015-09-10 NOTE — Progress Notes (Signed)
Morbidly obese 70 year old female with pressure ulcers and cellulitis lower abdomen and groin. Told caretakers to use silver alginate daily.  This will be very difficult to heal due the huge pannus .  Wrote for lidocaine to help.

## 2015-09-11 NOTE — Progress Notes (Signed)
[name], [name] ([id number]) Visit Report for 09/10/2015 Abuse/Suicide Risk Screen Details Patient Name: [name], [name]. Date of Service: 09/10/2015 8:45 AM Medical Record Patient Account Number: [id number] Number: Afful, RN, BSN, Treating RN: 06/11/1945 (70 y.o. [place] Sink Date of Birth/Sex: Female) Other Clinician: Primary Care Physician: [facility] Treating Jimmey Ralph, PETER Referring Physician: Gwen Pounds, DAVID Physician/Extender: Weeks in Treatment: 0 Abuse/Suicide Risk Screen Items Answer ABUSE/SUICIDE RISK SCREEN: Has anyone close to you tried to hurt or harm you recentlyo No Do you feel uncomfortable with anyone in your familyo No Has anyone forced you do things that you didnot want to doo No Do you have any thoughts of harming yourselfo No Patient displays signs or symptoms of abuse and/or neglect. No Electronic Signature(s) Signed: 09/10/2015 4:12:24 PM By: Elpidio Eric BSN, RN Entered By: Elpidio Eric on 09/10/2015 08:54:04 Fear, [name] ([id number]) -------------------------------------------------------------------------------- Activities of Daily Living Details Patient Name: Kendra, BURESH R. Date of Service: 09/10/2015 8:45 AM Medical Record Patient Account Number: [id number] Number: Afful, RN, BSN, Treating RN: 10/08/1945 (70 y.o. [place] Sink Date of Birth/Sex: Female) Other Clinician: Primary Care Physician: [facility] Treating Jimmey Ralph, PETER Referring Physician: Gwen Pounds, DAVID Physician/Extender: Weeks in Treatment: 0 Activities of Daily Living Items Answer Activities of Daily Living (Please select one for each item) Drive Automobile Need Assistance Take Medications Completely Able Use Telephone Completely Able Care for Appearance Completely Able Use Toilet Completely Able Bath / Shower Need Assistance Dress Self Need Assistance Feed Self Need Assistance Walk Need Assistance Get In / Out Bed Need Assistance Housework Need  Assistance Prepare Meals Need Assistance Handle Money Need Assistance Shop for Self Need Assistance Electronic Signature(s) Signed: 09/10/2015 4:12:24 PM By: Elpidio Eric BSN, RN Entered By: Elpidio Eric on 09/10/2015 08:54:49 Winsett, [name] ([id number]) -------------------------------------------------------------------------------- Education Assessment Details Patient Name: Kendra Sabal R. Date of Service: 09/10/2015 8:45 AM Medical Record Patient Account Number: [id number] Number: Afful, RN, BSN, Treating RN: 1945-04-08 (70 y.o. [place] Sink Date of Birth/Sex: Female) Other Clinician: Primary Care Physician: [facility] Treating Jimmey Ralph, PETER Referring Physician: Gwen Pounds, DAVID Physician/Extender: Tania Ade in Treatment: 0 Primary Learner Assessed: Patient Learning Preferences/Education Level/Primary Language Learning Preference: Explanation Highest Education Level: High School Preferred Language: English Cognitive Barrier Assessment/Beliefs Language Barrier: No Physical Barrier Assessment Impaired Vision: Yes Glasses Impaired Hearing: No Decreased Hand dexterity: No Knowledge/Comprehension Assessment Knowledge Level: Medium Comprehension Level: Medium Ability to understand written Medium instructions: Ability to understand verbal Medium instructions: Motivation Assessment Anxiety Level: Calm Cooperation: Cooperative Education Importance: Acknowledges Need Interest in Health Problems: Asks Questions Perception: Coherent Willingness to Engage in Self- Medium Management Activities: Readiness to Engage in Self- Medium Management Activities: Electronic Signature(s) Signed: 09/10/2015 4:12:24 PM By: Elpidio Eric BSN, RN Entered By: Elpidio Eric on 09/10/2015 08:54:54 [name], ILIANI VEJAR ([id number]) Richert, [name] ([id number]) -------------------------------------------------------------------------------- Fall Risk Assessment Details Patient Name: Kendra Sabal R. Date of Service: 09/10/2015 8:45 AM Medical Record Patient Account Number: [id number] Number: Afful, RN, BSN, Treating RN: [birth year] (70 y.o. [place] Sink Date of Birth/Sex: Female) Other Clinician: Primary Care Physician: [facility] Treating Jimmey Ralph, PETER Referring Physician: Gwen Pounds, DAVID Physician/Extender: Weeks in Treatment: 0 Fall Risk Assessment Items Have you had 2 or more falls in the last 12 monthso 0 No Have you had any fall that resulted in injury in the last 12 monthso 0 No FALL RISK ASSESSMENT: History of falling - immediate or within 3 months 0 No Secondary diagnosis 0 No Ambulatory aid None/bed rest/wheelchair/nurse 0 No Crutches/cane/walker 15 Yes Furniture 0 No  IV Access/Saline Lock 0 No Gait/Training Normal/bed rest/immobile 0 No Weak 10 Yes Impaired 0 No Mental Status Oriented to own ability 0 Yes Electronic Signature(s) Signed: 09/10/2015 4:12:24 PM By: Elpidio Eric BSN, RN Entered By: Elpidio Eric on 09/10/2015 08:55:00 Philippi, [name] ([id number]) -------------------------------------------------------------------------------- Foot Assessment Details Patient Name: Kendra Sabal R. Date of Service: 09/10/2015 8:45 AM Medical Record Patient Account Number: [id number] Number: Afful, RN, BSN, Treating RN: [birth year] (70 y.o. [place] Sink Date of Birth/Sex: Female) Other Clinician: Primary Care Physician: [facility] Treating Jimmey Ralph, PETER Referring Physician: Gwen Pounds, DAVID Physician/Extender: Weeks in Treatment: 0 Foot Assessment Items Site Locations + = Sensation present, - = Sensation absent, C = Callus, U = Ulcer R = Redness, W = Warmth, M = Maceration, PU = Pre-ulcerative lesion F = Fissure, S = Swelling, D = Dryness Assessment Right: Left: Other Deformity: No No Prior Foot Ulcer: No No Prior Amputation: No No Charcot Joint: No No Ambulatory Status: Ambulatory With Help Assistance Device: Walker Gait:  Steady Notes na Electronic Signature(s) Signed: 09/10/2015 4:12:24 PM By: Elpidio Eric BSN, RN [name], [name] ([id number]) Entered By: Elpidio Eric on 09/10/2015 08:59:12 Musselman, Amara RMarland [name] ([id number]) -------------------------------------------------------------------------------- Nutrition Risk Assessment Details Patient Name: Wachs, Babygirl R. Date of Service: 09/10/2015 8:45 AM Medical Record Patient Account Number: [id number] Number: Afful, RN, BSN, Treating RN: 04-Nov-1945 (70 y.o. [place] Sink Date of Birth/Sex: Female) Other Clinician: Primary Care Physician: [facility] Treating Jimmey Ralph, PETER Referring Physician: Gwen Pounds, DAVID Physician/Extender: Weeks in Treatment: 0 Height (in): 62 Weight (lbs): 251 Body Mass Index (BMI): 45.9 Nutrition Risk Assessment Items NUTRITION RISK SCREEN: I have an illness or condition that made me change the kind and/or 2 Yes amount of food I eat I eat fewer than two meals per day 0 No I eat few fruits and vegetables, or milk products 0 No I have three or more drinks of beer, liquor or wine almost every day 0 No I have tooth or mouth problems that make it hard for me to eat 0 No I don't always have enough money to buy the food I need 0 No I eat alone most of the time 1 Yes I take three or more different prescribed or over-the-counter drugs a 1 Yes day Without wanting to, I have lost or gained 10 pounds in the last six 0 No months I am not always physically able to shop, cook and/or feed myself 0 No Nutrition Protocols Good Risk Protocol 0 No interventions needed Moderate Risk Protocol Electronic Signature(s) Signed: 09/10/2015 4:12:24 PM By: Elpidio Eric BSN, RN Entered By: Elpidio Eric on 09/10/2015 08:55:16

## 2015-09-11 NOTE — Progress Notes (Signed)
SWAYZEE, WADLEY ([id number]) Visit Report for 09/10/2015 Allergy List Details Patient Name: [name], [name]. Date of Service: 09/10/2015 8:45 AM Medical Record Number: [id number] Patient Account Number: [id number] Date of Birth/Sex: 1945-07-01 (69 y.o. Female) Treating RN: Clover Mealy, RN, BSN, [place] Sink Primary Care Physician: Velora Mediate Other Clinician: Referring Physician: Gwen Pounds, DAVID Treating Physician/Extender: Ardath Sax Weeks in Treatment: 0 Allergies Active Allergies codeine Allergy Notes Electronic Signature(s) Signed: 09/10/2015 4:12:24 PM By: Elpidio Eric BSN, RN Entered By: Elpidio Eric on 09/10/2015 08:42:16 Marcott, [name] ([id number]) -------------------------------------------------------------------------------- Arrival Information Details Patient Name: Kendra Sabal R. Date of Service: 09/10/2015 8:45 AM Medical Record Number: [id number] Patient Account Number: [id number] Date of Birth/Sex: August 21, 1945 (69 y.o. Female) Treating RN: Clover Mealy, RN, BSN, [place] Sink Primary Care Physician: Velora Mediate Other Clinician: Referring Physician: Gwen Pounds, DAVID Treating Physician/Extender: Elayne Snare in Treatment: 0 Visit Information Patient Arrived: Cloyde Reams Time: 08:39 Accompanied By: dtr Transfer Assistance: None Patient Identification Verified: Yes Secondary Verification Process Completed: Yes Patient Requires Transmission-Based No Precautions: Patient Has Alerts: No Electronic Signature(s) Signed: 09/10/2015 4:12:24 PM By: Elpidio Eric BSN, RN Entered By: Elpidio Eric on 09/10/2015 08:39:59 Lem, Nashea [name] Kitchen ([id number]) -------------------------------------------------------------------------------- Clinic Level of Care Assessment Details Patient Name: Kendra Sabal R. Date of Service: 09/10/2015 8:45 AM Medical Record Number: [id number] Patient Account Number: [id number] Date of Birth/Sex: Dec 06, 1945 (69 y.o. Female) Treating RN: Afful, RN, BSN,  [place] Sink Primary Care Physician: Velora Mediate Other Clinician: Referring Physician: Gwen Pounds, DAVID Treating Physician/Extender: Elayne Snare in Treatment: 0 Clinic Level of Care Assessment Items TOOL 2 Quantity Score  - Use when only an EandM is performed on the INITIAL visit 0 ASSESSMENTS - Nursing Assessment / Reassessment X - General Physical Exam (combine w/ comprehensive assessment (listed just 1 20 below) when performed on new pt. evals) X - Comprehensive Assessment (HX, ROS, Risk Assessments, Wounds Hx, etc.) 1 25 ASSESSMENTS - Wound and Skin Assessment / Reassessment X - Simple Wound Assessment / Reassessment - one wound 1 5  - Complex Wound Assessment / Reassessment - multiple wounds 0  - Dermatologic / Skin Assessment (not related to wound area) 0 ASSESSMENTS - Ostomy and/or Continence Assessment and Care  - Incontinence Assessment and Management 0  - Ostomy Care Assessment and Management (repouching, etc.) 0 PROCESS - Coordination of Care X - Simple Patient / Family Education for ongoing care 1 15  - Complex (extensive) Patient / Family Education for ongoing care 0 X - Staff obtains Chiropractor, Records, Test Results / Process Orders 1 10  - Staff telephones HHA, Nursing Homes / Clarify orders / etc 0  - Routine Transfer to another Facility (non-emergent condition) 0  - Routine Hospital Admission (non-emergent condition) 0 X - New Admissions / Manufacturing engineer / Ordering NPWT, Apligraf, etc. 1 15  - Emergency Hospital Admission (emergent condition) 0  - Simple Discharge Coordination 0 Gessel, [name] ([id number])  - Complex (extensive) Discharge Coordination 0 PROCESS - Special Needs  - Pediatric / Minor Patient Management 0  - Isolation Patient Management 0  - Hearing / Language / Visual special needs 0  - Assessment of Community assistance (transportation, D/C planning, etc.) 0  - Additional assistance / Altered mentation  0  - Support Surface(s) Assessment (bed, cushion, seat, etc.) 0 INTERVENTIONS - Wound Cleansing / Measurement X - Wound Imaging (photographs - any number of wounds) 1 5  - Wound Tracing (instead of photographs) 0  - Simple Wound Measurement - one wound 0 X - Complex Wound Measurement -  multiple wounds 3 5 []  - Simple Wound Cleansing - one wound 0 []  - Complex Wound Cleansing - multiple wounds 0 INTERVENTIONS - Wound Dressings []  - Small Wound Dressing one or multiple wounds 0 []  - Medium Wound Dressing one or multiple wounds 0 X - Large Wound Dressing one or multiple wounds 3 20 []  - Application of Medications - injection 0 INTERVENTIONS - Miscellaneous []  - External ear exam 0 []  - Specimen Collection (cultures, biopsies, blood, body fluids, etc.) 0 []  - Specimen(s) / Culture(s) sent or taken to Lab for analysis 0 []  - Patient Transfer (multiple staff / Lift / Similar devices) 0 []  - Simple Staple / Suture removal (25 or less) 0 []  - Complex Staple / Suture removal (26 or more) 0 Violett, Stephanie R. ( ) []  - Hypo / Hyperglycemic Management (close monitor of Blood Glucose) 0 []  - Ankle / Brachial Index (ABI) - do not check if billed separately 0 Has the patient been seen at the hospital within the last three years: Yes Total Score: 170 Level Of Care: New/Established - Level 5 Electronic Signature(s) Signed: 09/10/2015 4:12:24 PM By: BSN, RN Entered By: on 09/10/2015 09:29:03 Sirek, [name] ( ) -------------------------------------------------------------------------------- Encounter Discharge Information Details Patient Name: R. Date of Service: 09/10/2015 8:45 AM Medical Record Number: Patient Account Number: Date of Birth/Sex: 1945/10/25 (69 y.o. Female) Treating RN: , RN, BSN, 09/12/2015 Primary Care Physician: Elpidio Eric Other Clinician: Referring Physician: Elpidio Eric, DAVID Treating  Physician/Extender: 09/12/2015 in Treatment: 0 Encounter Discharge Information Items Discharge Pain Level: 0 Discharge Condition: Stable Ambulatory Status: Walker Discharge Destination: Home Transportation: Private Auto Accompanied By: caregiver Schedule Follow-up Appointment: No Medication Reconciliation completed No and provided to Patient/Care Martice Doty: Provided on Clinical Summary of Care: 09/10/2015 Form Type Recipient Paper Patient DP Electronic Signature(s) Signed: 09/10/2015 9:54:09 AM By: Kendra Sabal MD Previous Signature: 09/10/2015 9:33:23 AM Version By: [id number] Entered By: [id number] on 09/10/2015 09:54:09 Maxson, Amila R. ([id number]) -------------------------------------------------------------------------------- Lower Extremity Assessment Details Patient Name: Peek, Nikaya R. Date of Service: 09/10/2015 8:45 AM Medical Record Number: [place] Sink Patient Account Number: Velora Mediate Date of Birth/Sex: April 10, 1945 (69 y.o. Female) Treating RN: Afful, RN, BSN, 09/12/2015 Primary Care Physician: 09/12/2015 Other Clinician: Referring Physician: Ardath Sax, DAVID Treating Physician/Extender: 09/12/2015 in Treatment: 0 Electronic Signature(s) Signed: 09/10/2015 4:12:24 PM By: Ardath Sax BSN, RN Entered By: 09/12/2015 on 09/10/2015 08:46:27 Rane, Aolani R7/15/2017 ([id number]) -------------------------------------------------------------------------------- Multi Wound Chart Details Patient Name: [id number] R. Date of Service: 09/10/2015 8:45 AM Medical Record Number: [id number] Patient Account Number: [place] Sink Date of Birth/Sex: 04-Oct-1945 (69 y.o. Female) Treating RN: Elayne Snare, RN, BSN, 09/12/2015 Primary Care Physician: Elpidio Eric Other Clinician: Referring Physician: Elpidio Eric, DAVID Treating Physician/Extender: 09/12/2015 in Treatment: 0 Vital Signs Height(in): 62 Pulse(bpm): 77 Weight(lbs): 251 Blood Pressure 162/82 (mmHg): Body Mass  Index(BMI): 46 Temperature(F): 97.6 Respiratory Rate 20 (breaths/min): Photos: [1:No Photos] [2:No Photos] [3:No Photos] Wound Location: [1:Abdomen - Lower Quadrant - Circumfernential] [2:Pubis] [3:Groin] Wounding Event: [1:Other Lesion] [2:Other Lesion] [3:Other Lesion] Primary Etiology: [1:Hidradenitis] [2:Hidradenitis] [3:Hidradenitis] Comorbid History: [1:Cataracts, Congestive Heart Failure, Hypertension, Type II Diabetes, Osteoarthritis, Neuropathy] [2:Cataracts, Congestive Heart Failure, Hypertension, Type II Diabetes, Osteoarthritis, Neuropathy] [3:Cataracts, Congestive Heart  Failure, Hypertension, Type II Diabetes, Osteoarthritis, Neuropathy] Date Acquired: [1:06/11/2015] [2:06/11/2015] [3:06/11/2015] Weeks of Treatment: [1:0] [2:0] [3:0] Wound Status: [1:Open] [2:Open] [3:Open] Measurements L x W x D 61x30x0.1 [2:10x5x0.2] [3:2x6x0.1] (cm) Area (cm) : 09/12/2015 [2:39.27] [3:9.425] Volume (cm) : [  1:143.728] [2:7.854] [3:0.942] Classification: [1:Full Thickness With Exposed Support Structures] [2:Full Thickness With Exposed Support Structures] [3:Full Thickness With Exposed Support Structures] HBO Classification: [1:N/A] [2:N/A] [3:Grade 1] Exudate Amount: [1:Large] [2:Large] [3:Large] Exudate Type: [1:Serous] [2:Serous] [3:Serous] Exudate Color: [1:amber] [2:amber] [3:amber] Wound Margin: [1:Indistinct, nonvisible] [2:Indistinct, nonvisible] [3:Indistinct, nonvisible] Granulation Amount: [1:Large (67-100%)] [2:Large (67-100%)] [3:Large (67-100%)] Granulation Quality: [1:Red, Pink] [2:Red, Pink] [3:Red, Pink] Necrotic Amount: [1:Small (1-33%)] [2:Small (1-33%)] [3:Small (1-33%)] Exposed Structures: Dedeaux, Darrel R. ([id number]) Fascia: No Fascia: No Fascia: No Fat: No Fat: No Fat: No Tendon: No Tendon: No Tendon: No Muscle: No Muscle: No Muscle: No Joint: No Joint: No Joint: No Bone: No Bone: No Bone: No Limited to Skin Limited to Skin Limited to  Skin Breakdown Breakdown Breakdown Epithelialization: None None None Periwound Skin Texture: Edema: Yes Edema: Yes Edema: Yes Periwound Skin Moist: Yes Moist: Yes Moist: Yes Moisture: Periwound Skin Color: Erythema: Yes Erythema: Yes Erythema: Yes Erythema Location: Circumferential Circumferential Circumferential Temperature: No Abnormality No Abnormality No Abnormality Tenderness on Yes Yes Yes Palpation: Wound Preparation: Ulcer Cleansing: Ulcer Cleansing: Ulcer Cleansing: Rinsed/Irrigated with Rinsed/Irrigated with Rinsed/Irrigated with Saline Saline Saline Topical Anesthetic Applied: None Treatment Notes Electronic Signature(s) Signed: 09/10/2015 4:12:24 PM By: Elpidio Eric BSN, RN Entered By: Elpidio Eric on 09/10/2015 09:25:32 [place]hen[place]renner, [name] ([id number]) -------------------------------------------------------------------------------- Multi-Disciplinary Care Plan Details Patient Name: Kendra Sabal R. Date of Service: 09/10/2015 8:45 AM Medical Record Number: [id number] Patient Account Number: [id number] Date of Birth/Sex: 05-25-1945 (69 y.o. Female) Treating RN: Afful, RN, BSN, [place] Sink Primary Care Physician: Velora Mediate Other Clinician: Referring Physician: Gwen Pounds, DAVID Treating Physician/Extender: Elayne Snare in Treatment: 0 Active Inactive Orientation to the Wound Care Program Nursing Diagnoses: Knowledge deficit related to the wound healing center program Goals: Patient/caregiver will verbalize understanding of the Wound Healing Center Program Date Initiated: 09/10/2015 Goal Status: Active Interventions: Provide education on orientation to the wound center Notes: Wound/Skin Impairment Nursing Diagnoses: Impaired tissue integrity Knowledge deficit related to ulceration/compromised skin integrity Goals: Patient/caregiver will verbalize understanding of skin care regimen Date Initiated: 09/10/2015 Goal Status: Active Ulcer/skin breakdown will have a  volume reduction of 30% by week 4 Date Initiated: 09/10/2015 Goal Status: Active Ulcer/skin breakdown will have a volume reduction of 50% by week 8 Date Initiated: 09/10/2015 Goal Status: Active Ulcer/skin breakdown will have a volume reduction of 80% by week 12 Date Initiated: 09/10/2015 Goal Status: Active Ulcer/skin breakdown will heal within 14 weeks Date Initiated: 09/10/2015 [name], [name] ([id number]) Goal Status: Active Interventions: Assess patient/caregiver ability to obtain necessary supplies Assess patient/caregiver ability to perform ulcer/skin care regimen upon admission and as needed Assess ulceration(s) every visit Provide education on ulcer and skin care Treatment Activities: Referred to DME Remee Charley for dressing supplies : 09/10/2015 Skin care regimen initiated : 09/10/2015 Topical wound management initiated : 09/10/2015 Notes: Electronic Signature(s) Signed: 09/10/2015 4:12:24 PM By: Elpidio Eric BSN, RN Entered By: Elpidio Eric on 09/10/2015 09:25:17 Dial, Sheralyn R. ([id number]) -------------------------------------------------------------------------------- Pain Assessment Details Patient Name: Kendra Sabal R. Date of Service: 09/10/2015 8:45 AM Medical Record Number: [id number] Patient Account Number: [id number] Date of Birth/Sex: 03-07-1945 (69 y.o. Female) Treating RN: Afful, RN, BSN, [place] Sink Primary Care Physician: Velora Mediate Other Clinician: Referring Physician: Gwen Pounds, DAVID Treating Physician/Extender: Elayne Snare in Treatment: 0 Active Problems Location of Pain Severity and Description of Pain Patient Has Paino No Site Locations With Dressing Change: No Pain Management and Medication Current Pain Management: Electronic Signature(s) Signed: 09/10/2015 4:12:24 PM By: Elpidio Eric BSN, RN Entered By:  Elpidio Eric on 09/10/2015 08:40:06 [name], [name] Kitchen  ([id number]) -------------------------------------------------------------------------------- Patient/Caregiver Education Details Patient Name: [name], [name] Date of Service: 09/10/2015 8:45 AM Medical Record Number: [id number] Patient Account Number: [id number] Date of Birth/Gender: 1945/08/11 (69 y.o. Female) Treating RN: Clover Mealy, RN, BSN, [place] Sink Primary Care Physician: Velora Mediate Other Clinician: Referring Physician: Gwen Pounds, DAVID Treating Physician/Extender: Elayne Snare in Treatment: 0 Education Assessment Education Provided To: Patient and Caregiver Education Topics Provided Basic Hygiene: Methods: Explain/Verbal Responses: State content correctly Welcome To The Wound Care Center: Methods: Explain/Verbal Responses: State content correctly Wound/Skin Impairment: Methods: Explain/Verbal Responses: State content correctly Electronic Signature(s) Signed: 09/10/2015 4:50:06 PM By: Ardath Sax MD Entered By: Ardath Sax on 09/10/2015 09:54:28 Grasse, [name] Kitchen ([id number]) -------------------------------------------------------------------------------- Wound Assessment Details Patient Name: Dohse, Nikea R. Date of Service: 09/10/2015 8:45 AM Medical Record Number: [id number] Patient Account Number: [id number] Date of Birth/Sex: 05-30-1945 (69 y.o. Female) Treating RN: Afful, RN, BSN, [place] Sink Primary Care Physician: Velora Mediate Other Clinician: Referring Physician: Gwen Pounds, DAVID Treating Physician/Extender: Elayne Snare in Treatment: 0 Wound Status Wound Number: 1 Primary Hidradenitis Etiology: Wound Location: Abdomen - Lower Quadrant - Circumfernential Wound Open Status: Wounding Event: Other Lesion Comorbid Cataracts, Congestive Heart Failure, Date Acquired: 06/11/2015 History: Hypertension, Type II Diabetes, Weeks Of Treatment: 0 Osteoarthritis, Neuropathy Clustered Wound: No Photos Photo Uploaded By: Elpidio Eric on 09/10/2015 12:37:17 Wound  Measurements Length: (cm) 61 Width: (cm) 30 Depth: (cm) 0.1 Area: (cm) 1437.279 Volume: (cm) 143.728 % Reduction in Area: % Reduction in Volume: Epithelialization: None Tunneling: No Undermining: No Wound Description Full Thickness With Exposed Classification: Support Structures Wound Margin: Indistinct, nonvisible Exudate Large Amount: Exudate Type: Serous Exudate Color: amber Foul Odor After Cleansing: No Wound Bed Granulation Amount: Large (67-100%) Exposed Structure Granulation Quality: Red, Pink Fascia Exposed: No Bresnan, [name] ([id number]) Necrotic Amount: Small (1-33%) Fat Layer Exposed: No Necrotic Quality: Adherent Slough Tendon Exposed: No Muscle Exposed: No Joint Exposed: No Bone Exposed: No Limited to Skin Breakdown Periwound Skin Texture Texture Color No Abnormalities Noted: No No Abnormalities Noted: No Localized Edema: Yes Erythema: Yes Erythema Location: Circumferential Moisture No Abnormalities Noted: No Temperature / Pain Moist: Yes Temperature: No Abnormality Tenderness on Palpation: Yes Wound Preparation Ulcer Cleansing: Rinsed/Irrigated with Saline Topical Anesthetic Applied: None Treatment Notes Wound #1 (Circumferential Abdomen - Lower Quadrant) 1. Cleansed with: Clean wound with Normal Saline 4. Dressing Applied: Aquacel Ag 5. Secondary Dressing Applied ABD Pad 7. Secured with Secretary/administrator) Signed: 09/10/2015 4:12:24 PM By: Elpidio Eric BSN, RN Entered By: Elpidio Eric on 09/10/2015 09:04:34 [place], [name] Kitchen ([id number]) -------------------------------------------------------------------------------- Wound Assessment Details Patient Name: Kendra Sabal R. Date of Service: 09/10/2015 8:45 AM Medical Record Number: [id number] Patient Account Number: [id number] Date of Birth/Sex: [birth year] (69 y.o. Female) Treating RN: Afful, RN, BSN, [place] Sink Primary Care Physician: Velora Mediate Other Clinician: Referring  Physician: Gwen Pounds, DAVID Treating Physician/Extender: Elayne Snare in Treatment: 0 Wound Status Wound Number: 2 Primary Hidradenitis Etiology: Wound Location: Pubis Wound Open Wounding Event: Other Lesion Status: Date Acquired: 06/11/2015 Comorbid Cataracts, Congestive Heart Failure, Weeks Of Treatment: 0 History: Hypertension, Type II Diabetes, Clustered Wound: No Osteoarthritis, Neuropathy Photos Photo Uploaded By: Elpidio Eric on 09/10/2015 12:37:52 Wound Measurements Length: (cm) 10 Width: (cm) 5 Depth: (cm) 0.2 Area: (cm) 39.27 Volume: (cm) 7.854 % Reduction in Area: % Reduction in Volume: Epithelialization: None Tunneling: No Undermining: No Wound Description Full Thickness With Exposed Classification: Support Structures Wound Margin: Indistinct, nonvisible Exudate Large Amount: Exudate Type: Serous Exudate Color:  amber Foul Odor After Cleansing: No Wound Bed Granulation Amount: Large (67-100%) Exposed Structure Granulation Quality: Red, Pink Fascia Exposed: No Jost, [name] ([id number]) Necrotic Amount: Small (1-33%) Fat Layer Exposed: No Necrotic Quality: Adherent Slough Tendon Exposed: No Muscle Exposed: No Joint Exposed: No Bone Exposed: No Limited to Skin Breakdown Periwound Skin Texture Texture Color No Abnormalities Noted: No No Abnormalities Noted: No Localized Edema: Yes Erythema: Yes Erythema Location: Circumferential Moisture No Abnormalities Noted: No Temperature / Pain Moist: Yes Temperature: No Abnormality Tenderness on Palpation: Yes Wound Preparation Ulcer Cleansing: Rinsed/Irrigated with Saline Treatment Notes Wound #2 (Pubis) 1. Cleansed with: Clean wound with Normal Saline 4. Dressing Applied: Aquacel Ag 5. Secondary Dressing Applied ABD Pad 7. Secured with Secretary/administrator) Signed: 09/10/2015 4:12:24 PM By: Elpidio Eric BSN, RN Entered By: Elpidio Eric on 09/10/2015 09:06:28 Schermer, [name] ([id number]) -------------------------------------------------------------------------------- Wound Assessment Details Patient Name: Eichinger, [name] Date of Service: 09/10/2015 8:45 AM Medical Record Number: [id number] Patient Account Number: [id number] Date of Birth/Sex: 02/11/1946 (69 y.o. Female) Treating RN: Afful, RN, BSN, [place] Sink Primary Care Physician: Velora Mediate Other Clinician: Referring Physician: Gwen Pounds, DAVID Treating Physician/Extender: Elayne Snare in Treatment: 0 Wound Status Wound Number: 3 Primary Hidradenitis Etiology: Wound Location: Groin Wound Open Wounding Event: Other Lesion Status: Date Acquired: 06/11/2015 Comorbid Cataracts, Congestive Heart Failure, Weeks Of Treatment: 0 History: Hypertension, Type II Diabetes, Clustered Wound: No Osteoarthritis, Neuropathy Photos Photo Uploaded By: Elpidio Eric on 09/10/2015 12:37:52 Wound Measurements Length: (cm) 2 Width: (cm) 6 Depth: (cm) 0.1 Area: (cm) 9.425 Volume: (cm) 0.942 % Reduction in Area: % Reduction in Volume: Epithelialization: None Tunneling: No Undermining: No Wound Description Full Thickness With Exposed Foul Odor Af Classification: Support Structures Diabetic Severity Grade 1 (Wagner): Wound Margin: Indistinct, nonvisible Exudate Amount: Large Exudate Type: Serous Exudate Color: amber ter Cleansing: No Wound Bed Granulation Amount: Large (67-100%) Exposed Structure Fussell, [name] ([id number]) Granulation Quality: Red, Pink Fascia Exposed: No Necrotic Amount: Small (1-33%) Fat Layer Exposed: No Necrotic Quality: Adherent Slough Tendon Exposed: No Muscle Exposed: No Joint Exposed: No Bone Exposed: No Limited to Skin Breakdown Periwound Skin Texture Texture Color No Abnormalities Noted: No No Abnormalities Noted: No Localized Edema: Yes Erythema: Yes Erythema Location: Circumferential Moisture No Abnormalities Noted: No Temperature / Pain Moist:  Yes Temperature: No Abnormality Tenderness on Palpation: Yes Wound Preparation Ulcer Cleansing: Rinsed/Irrigated with Saline Treatment Notes Wound #3 (Groin) 1. Cleansed with: Clean wound with Normal Saline 4. Dressing Applied: Aquacel Ag 5. Secondary Dressing Applied ABD Pad 7. Secured with Secretary/administrator) Signed: 09/10/2015 4:12:24 PM By: Elpidio Eric BSN, RN Entered By: Elpidio Eric on 09/10/2015 09:08:05 [place], [name] ([id number]) -------------------------------------------------------------------------------- Vitals Details Patient Name: Kendra Sabal R. Date of Service: 09/10/2015 8:45 AM Medical Record Number: [id number] Patient Account Number: [id number] Date of Birth/Sex: Nov 17, 1945 (69 y.o. Female) Treating RN: Afful, RN, BSN, [place] Sink Primary Care Physician: Velora Mediate Other Clinician: Referring Physician: Gwen Pounds, DAVID Treating Physician/Extender: Elayne Snare in Treatment: 0 Vital Signs Time Taken: 08:42 Temperature (F): 97.6 Height (in): 62 Pulse (bpm): 77 Source: Stated Respiratory Rate (breaths/min): 20 Weight (lbs): 251 Blood Pressure (mmHg): 162/82 Source: Measured Reference Range: 80 - 120 mg / dl Body Mass Index (BMI): 45.9 Electronic Signature(s) Signed: 09/10/2015 4:12:24 PM By: Elpidio Eric BSN, RN Entered By: Elpidio Eric on 09/10/2015 08:46:18

## 2015-09-11 NOTE — Progress Notes (Addendum)
Kendra, [name] ([id number]) Visit Report for 09/10/2015 Chief Complaint Document Details Patient Name: [name]. Date of Service: 09/10/2015 8:45 AM Medical Record Patient Account Number: [id number] Number: Afful, RN, BSN, Treating RN: 12-06-1945 (70 y.o. [place] Sink Date of Birth/Sex: Female) Other Clinician: Primary Care Physician: [facility] Treating Jimmey Ralph, Mertha Clyatt Referring Physician: Velora Mediate Physician/Extender: Weeks in Treatment: 0 Information Obtained from: Patient Electronic Signature(s) Signed: 09/10/2015 9:41:16 AM By: Ardath Sax MD Entered By: Ardath Sax on 09/10/2015 09:41:16 Heathman, Don RMarland Kitchen ([id number]) -------------------------------------------------------------------------------- HPI Details Patient Name: [name]. Date of Service: 09/10/2015 8:45 AM Medical Record Patient Account Number: [id number] Number: Afful, RN, BSN, Treating RN: 1945/06/16 (70 y.o. [place] Sink Date of Birth/Sex: Female) Other Clinician: Primary Care Physician: [facility], Jim Desanctis, Dionisios Ricci Referring Physician: Velora Mediate Physician/Extender: Weeks in Treatment: 0 Electronic Signature(s) Signed: 09/10/2015 9:41:23 AM By: Ardath Sax MD Entered By: Ardath Sax on 09/10/2015 09:41:22 [name] ([id number]) -------------------------------------------------------------------------------- Physical Exam Details Patient Name: [name]. Date of Service: 09/10/2015 8:45 AM Medical Record Patient Account Number: [id number] Number: Afful, RN, BSN, Treating RN: 11/30/1945 (70 y.o.  [place] Date of Birth/Sex: Female) Other Clinician: Primary Care Physician: [facility], Jim Desanctis, Emony Dormer Referring Physician: Velora Mediate Physician/Extender: Weeks in Treatment: 0 Electronic Signature(s) Signed: 09/10/2015 9:41:29 AM By: Ardath Sax MD Entered By: Ardath Sax on 09/10/2015 09:41:29 [name], [name]  ([id number]) -------------------------------------------------------------------------------- Physician Orders Details Patient Name: [name]. Date of Service: 09/10/2015 8:45 AM Medical Record Patient Account Number: [id number] Number: Afful, RN, BSN, Treating RN: 07-02-1945 (70 y.o.  [place] Date of Birth/Sex: Female) Other Clinician: Primary Care Physician: [facility] Treating Darrnell Mangiaracina Referring Physician: Velora Mediate Physician/Extender: Weeks in Treatment: 0 Verbal / Phone Orders: Yes Clinician: Afful, RN, BSN, Rita Read Back and Verified: Yes Diagnosis Coding Wound Cleansing Wound #1 Circumferential Abdomen - Lower Quadrant o Clean wound with Normal Saline. Wound #2 Pubis o Clean wound with Normal Saline. Wound #3 Groin o Clean wound with Normal Saline. Primary Wound Dressing Wound #1 Circumferential Abdomen - Lower Quadrant o Aquacel Ag Wound #2 Pubis o Aquacel Ag Wound #3 Groin o Aquacel Ag Secondary Dressing Wound #1 Circumferential Abdomen - Lower Quadrant o ABD pad Wound #2 Pubis o ABD pad Wound #3 Groin o ABD pad Dressing Change Frequency Wound #1 Circumferential Abdomen - Lower Quadrant o Change dressing every other day. Wound #2 Pubis [name]. ([id number]) o Change dressing every other day. Wound #3 Groin o Change dressing every other day. Follow-up Appointments Wound #1 Circumferential Abdomen - Lower Quadrant o Return Appointment in 1 week. - on Monday Wound #2 Pubis o Return Appointment in 1 week. - on Monday Wound #3 Groin o Return Appointment in 1 week. - on Monday Additional Orders / Instructions Wound #1 Circumferential Abdomen - Lower Quadrant o Increase protein intake. o Activity as tolerated Wound #2 Pubis o Increase protein intake. o Activity as tolerated Wound #3 Groin o Increase protein intake. o Activity as tolerated Laboratory o Bacteria identified in  Wound by Culture (MICRO) - abdomen, groin area oooo [name] Electronic Signature(s) Signed: 09/10/2015 4:12:24 PM By: Elpidio Eric BSN, RN Signed: 09/10/2015 4:50:06 PM By: Ardath Sax MD Entered By: Elpidio Eric on 09/10/2015 09:27:36 [name]. ([id number]) -------------------------------------------------------------------------------- Problem List Details Patient Name: Kendra, [name] Date of Service: 09/10/2015 8:45 AM Medical Record Patient Account Number: [id number] Number: Afful, RN, BSN, Treating RN: 1945-10-12 (69 y.o.  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 ([id number]) -------------------------------------------------------------------------------- Progress Note Details Patient Name: [name]. Date of Service: 09/10/2015 8:45 AM Medical Record Patient Account Number: [id number] Number: Afful, RN, BSN, Treating RN: 20-Mar-1945 (70 y.o. [place] Sink Date of Birth/Sex: Female) Other Clinician: Primary Care Physician: [facility] Treating Jimmey Ralph, [name] Referring Physician: Velora Mediate Physician/Extender: Weeks in Treatment: 0 Subjective Chief Complaint Information obtained from Patient Wound History Patient reportedly has not tested positive for osteomyelitis. Patient reportedly has had testing performed to evaluate  circulation in the legs. Patient experiences the following problems associated with their wounds: swelling. Patient History Information obtained from Patient, Caregiver. Allergies codeine Family History Cancer - Mother, Father, Heart Disease - Father, Siblings, Hypertension - Father, Siblings, Tuberculosis - Siblings, No family history of Diabetes, Hereditary Spherocytosis, Kidney Disease, Lung Disease, Seizures, Stroke, Thyroid Problems. Social History Never smoker, Marital Status - Widowed, Alcohol Use - Never, Drug Use - No History, Caffeine Use - Daily. Medical History Eyes Patient has history of Cataracts - surgery Respiratory Denies history of Chronic Obstructive Pulmonary Disease (COPD) Cardiovascular Patient has history of Congestive Heart Failure, Hypertension Endocrine Patient has history of Type II Diabetes Musculoskeletal Patient has history of Osteoarthritis Neurologic Brier, Chikita [name]. ([id number]) Patient has history of Neuropathy Patient is treated with Insulin, Oral Agents. Blood sugar is tested. Review of Systems (ROS) Constitutional Symptoms (General Health) The patient has no complaints or symptoms. Eyes Complains or has symptoms of Glasses / Contacts - glasses. Ear/Nose/Mouth/Throat The patient has no complaints or symptoms. Hematologic/Lymphatic The patient has no complaints or symptoms. Respiratory The patient has no complaints or symptoms. Cardiovascular heart mumur hyperlipidemia Gastrointestinal GERD Endocrine Complains or has symptoms of Thyroid disease. Genitourinary The patient has no complaints or symptoms. Immunological The patient has no complaints or symptoms. Integumentary (Skin) Complains or has symptoms of Wounds. Oncologic The patient has no complaints or symptoms. Psychiatric The patient has no complaints or symptoms. Objective Constitutional Vitals Time Taken: 8:42 AM, Height: 62 in, Source: Stated, Weight: 251 lbs,  Source: Measured, BMI: 45.9, Temperature: 97.6 F, Pulse: 77 bpm, Respiratory Rate: 20 breaths/min, Blood Pressure: 162/82 mmHg. Integumentary (Hair, Skin) Wound #1 status is Open. Original cause of wound was Other Lesion. The wound is located on the Circumferential Abdomen - Lower Quadrant. The wound measures 61cm length x 30cm width x 0.1cm depth; 1437.279cm^2 area and 143.728cm^3 volume. The wound is limited to skin breakdown. There is no tunneling or undermining noted. There is a large amount of serous drainage noted. The wound margin is indistinct and nonvisible. There is large (67-100%) red, pink granulation within the wound bed. There is a Rahm, Reagann [name]. ([id number]) small (1-33%) amount of necrotic tissue within the wound bed including Adherent Slough. The periwound skin appearance exhibited: Localized Edema, Moist, Erythema. The surrounding wound skin color is noted with erythema which is circumferential. Periwound temperature was noted as No Abnormality. The periwound has tenderness on palpation. Wound #2 status is Open. Original cause of wound was Other Lesion. The wound is located on the Pubis. The wound measures 10cm length  x 5cm width x 0.2cm depth; 39.27cm^2 area and 7.854cm^3 volume. The wound is limited to skin breakdown. There is no tunneling or undermining noted. There is a large amount of serous drainage noted. The wound margin is indistinct and nonvisible. There is large (67-100%) red, pink granulation within the wound bed. There is a small (1-33%) amount of necrotic tissue within the wound bed including Adherent Slough. The periwound skin appearance exhibited: Localized Edema, Moist, Erythema. The surrounding wound skin color is noted with erythema which is circumferential. Periwound temperature was noted as No Abnormality. The periwound has tenderness on palpation. Wound #3 status is Open. Original cause of wound was Other Lesion. The wound is located on the Groin. The  wound measures 2cm length x 6cm width x 0.1cm depth; 9.425cm^2 area and 0.942cm^3 volume. The wound is limited to skin breakdown. There is no tunneling or undermining noted. There is a large amount of serous drainage noted. The wound margin is indistinct and nonvisible. There is large (67-100%) red, pink granulation within the wound bed. There is a small (1-33%) amount of necrotic tissue within the wound bed including Adherent Slough. The periwound skin appearance exhibited: Localized Edema, Moist, Erythema. The surrounding wound skin color is noted with erythema which is circumferential. Periwound temperature was noted as No Abnormality. The periwound has tenderness on palpation. 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 This will be difficult to heal. Will have caretakers use silver alginte bid after washing. Wrote Rx for lidocaine. 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 Wound #2 Pubis: Clean wound with Normal Saline. [name]. ([id number]) Wound #3 Groin: Clean wound with Normal Saline. Primary Wound Dressing: Wound #1 Circumferential Abdomen - Lower Quadrant: Aquacel Ag Wound #2 Pubis: Aquacel Ag Wound #3 Groin: Aquacel Ag Secondary Dressing: Wound #1 Circumferential Abdomen - Lower Quadrant: ABD pad Wound #2 Pubis: ABD pad Wound #3 Groin: ABD pad Dressing Change Frequency: Wound #1 Circumferential Abdomen - Lower Quadrant: Change dressing every other day. Wound #2 Pubis: Change dressing every other day. Wound #3 Groin: Change dressing every other day. Follow-up Appointments: Wound #1 Circumferential Abdomen - Lower Quadrant: Return Appointment in 1 week. - on Monday Wound #2 Pubis: Return Appointment in 1 week. - on Monday Wound #3  Groin: Return Appointment in 1 week. - on Monday Additional Orders / Instructions: Wound #1 Circumferential Abdomen - Lower Quadrant: Increase protein intake. Activity as tolerated Wound #2 Pubis: Increase protein intake. Activity as tolerated Wound #3 Groin: Increase protein intake. Activity as tolerated Laboratory ordered were: Wound culture routine - abdomen, groin area Follow-Up Appointments: A Patient Clinical Summary of Care was provided to [facility] [name]. ([id number]) Electronic Signature(s) Signed: 09/18/2015 11:54:35 AM By: Ardath Sax MD Previous Signature: 09/10/2015 9:45:42 AM Version By: Ardath Sax MD Entered By: Ardath Sax on 09/18/2015 11:54:35 [name], Kendra RMarland Kitchen ([id number]) -------------------------------------------------------------------------------- ROS/PFSH Details Patient Name: [name]. Date of Service: 09/10/2015 8:45 AM Medical Record Patient Account Number: [id number] Number: Afful, RN, BSN, Treating RN: 02-02-1946 (70 y.o. [place] Sink Date of Birth/Sex: Female) Other Clinician: Primary Care Physician: [facility] Treating Keyonda Bickle Referring Physician: Velora Mediate Physician/Extender: Weeks in Treatment: 0 Information Obtained From Patient Caregiver Wound History Do you currently have one or more open woundso Yes Approximately how long have you had your woundso 3 months How  have you been treating your wound(s) until nowo cream Has your wound(s) ever healed and then re-openedo No Have you had any lab work done in the past montho No Have you tested positive for an antibiotic resistant organism (MRSA, VRE)o No Have you tested positive for osteomyelitis (bone infection)o No Have you had any tests for circulation on your legso Yes Who ordered the testo dr dew [facility] test [facility] Have you had other problems associated with your woundso Swelling Eyes Complaints and Symptoms: Positive for: Glasses / Contacts -  glasses Medical History: Positive for: Cataracts - surgery Endocrine Complaints and Symptoms: Positive for: Thyroid disease Medical History: Positive for: Type II Diabetes Time with diabetes: 15 years Treated with: Insulin, Oral agents Blood sugar tested every day: Yes Tested : 2x a day Integumentary (Skin) Complaints and Symptoms: Positive for: Wounds Raz, [name]. ([id number]) Constitutional Symptoms (General Health) Complaints and Symptoms: No Complaints or Symptoms Ear/Nose/Mouth/Throat Complaints and Symptoms: No Complaints or Symptoms Hematologic/Lymphatic Complaints and Symptoms: No Complaints or Symptoms Respiratory Complaints and Symptoms: No Complaints or Symptoms Medical History: Negative for: Chronic Obstructive Pulmonary Disease (COPD) Cardiovascular Complaints and Symptoms: Review of System Notes: heart mumur hyperlipidemia Medical History: Positive for: Congestive Heart Failure; Hypertension Gastrointestinal Complaints and Symptoms: Review of System Notes: GERD Genitourinary Complaints and Symptoms: No Complaints or Symptoms Immunological Complaints and Symptoms: No Complaints or Symptoms Musculoskeletal [name]. ([id number]) Medical History: Positive for: Osteoarthritis Neurologic Medical History: Positive for: Neuropathy Oncologic Complaints and Symptoms: No Complaints or Symptoms Psychiatric Complaints and Symptoms: No Complaints or Symptoms HBO Extended History Items Eyes: Cataracts Family and Social History Cancer: Yes - Mother, Father; Diabetes: No; Heart Disease: Yes - Father, Siblings; Hereditary Spherocytosis: No; Hypertension: Yes - Father, Siblings; Kidney Disease: No; Lung Disease: No; Seizures: No; Stroke: No; Thyroid Problems: No; Tuberculosis: Yes - Siblings; Never smoker; Marital Status - Widowed; Alcohol Use: Never; Drug Use: No History; Caffeine Use: Daily; Financial Concerns: No; Food, Clothing or Shelter  Needs: No; Support System Lacking: No; Transportation Concerns: No; Advanced Directives: No; Patient does not want information on Advanced Directives; Do not resuscitate: No; Living Will: Yes (Not Provided); Medical Power of Attorney: Yes - Roseanna Rainbow (Not Provided) Electronic Signature(s) Signed: 09/10/2015 4:12:24 PM By: Elpidio Eric BSN, RN Signed: 09/10/2015 4:50:06 PM By: Ardath Sax MD Entered By: Elpidio Eric on 09/10/2015 08:53:59 [name] RMarland Kitchen ([id number]) -------------------------------------------------------------------------------- SuperBill Details Patient Name: [name]. Date of Service: 09/10/2015 Medical Record Patient Account Number: [id number] Number: Afful, RN, BSN, Treating RN: 02-Mar-1945 (70 y.o. 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

## 2015-09-13 LAB — AEROBIC CULTURE  (SUPERFICIAL SPECIMEN): CULTURE: NO GROWTH

## 2015-09-13 LAB — AEROBIC CULTURE W GRAM STAIN (SUPERFICIAL SPECIMEN)

## 2015-09-14 ENCOUNTER — Encounter: Payer: Medicare Other | Admitting: Surgery

## 2015-09-14 DIAGNOSIS — L03311 Cellulitis of abdominal wall: Secondary | ICD-10-CM | POA: Diagnosis not present

## 2015-09-15 NOTE — Progress Notes (Signed)
KISA, FUJII ([id number]) Visit Report for 09/14/2015 Arrival Information Details Patient Name: [name], [name]. Date of Service: 09/14/2015 1:30 PM Medical Record Number: [id number] Patient Account Number: [id number] Date of Birth/Sex: 12/06/1945 (70 y.o. Female) Treating RN: Ashok Cordia, Debi Primary Care Physician: [facility] Other Clinician: Referring Physician: [facility] Treating Physician/Extender: Rudene Re in Treatment: 0 Visit Information History Since Last Visit All ordered tests and consults were completed: No Patient Arrived: Dan Humphreys Added or deleted any medications: No Arrival Time: 13:33 Any new allergies or adverse reactions: No Accompanied By: caregivers Had a fall or experienced change in No Transfer Assistance: None activities of daily living that may affect Patient Identification Verified: Yes risk of falls: Secondary Verification Process Yes Signs or symptoms of abuse/neglect since last No Completed: visito Patient Requires Transmission-Based No Hospitalized since last visit: No Precautions: Pain Present Now: No Patient Has Alerts: No Electronic Signature(s) Signed: 09/14/2015 4:39:30 PM By: Alejandro Mulling Entered By: Alejandro Mulling on 09/14/2015 13:33:28 Kloss, Alijah R. ([id number]) -------------------------------------------------------------------------------- Clinic Level of Care Assessment Details Patient Name: Kendra Sabal R. Date of Service: 09/14/2015 1:30 PM Medical Record Number: [id number] Patient Account Number: [id number] Date of Birth/Sex: 07/01/1945 (70 y.o. Female) Treating RN: Ashok Cordia, Debi Primary Care Physician: [facility] Other Clinician: Referring Physician: [facility] Treating Physician/Extender: Rudene Re in Treatment: 0 Clinic Level of Care Assessment Items TOOL 4 Quantity Score X - Use when only an EandM is performed on FOLLOW-UP visit 1 0 ASSESSMENTS - Nursing Assessment /  Reassessment X - Reassessment of Co-morbidities (includes updates in patient status) 1 10 X - Reassessment of Adherence to Treatment Plan 1 5 ASSESSMENTS - Wound and Skin Assessment / Reassessment []  - Simple Wound Assessment / Reassessment - one wound 0 X - Complex Wound Assessment / Reassessment - multiple wounds 3 5 []  - Dermatologic / Skin Assessment (not related to wound area) 0 ASSESSMENTS - Focused Assessment []  - Circumferential Edema Measurements - multi extremities 0 []  - Nutritional Assessment / Counseling / Intervention 0 []  - Lower Extremity Assessment (monofilament, tuning fork, pulses) 0 []  - Peripheral Arterial Disease Assessment (using hand held doppler) 0 ASSESSMENTS - Ostomy and/or Continence Assessment and Care []  - Incontinence Assessment and Management 0 []  - Ostomy Care Assessment and Management (repouching, etc.) 0 PROCESS - Coordination of Care []  - Simple Patient / Family Education for ongoing care 0 X - Complex (extensive) Patient / Family Education for ongoing care 1 20 []  - Staff obtains , Records, Test Results / Process Orders 0 []  - Staff telephones HHA, Nursing Homes / Clarify orders / etc 0 []  - Routine Transfer to another Facility (non-emergent condition) 0 Cloninger, Mesa R. ( ) []  - Routine Hospital Admission (non-emergent condition) 0 []  - New Admissions / / Ordering NPWT, Apligraf, etc. 0 []  - Emergency Hospital Admission (emergent condition) 0 X - Simple Discharge Coordination 1 10 []  - Complex (extensive) Discharge Coordination 0 PROCESS - Special Needs []  - Pediatric / Minor Patient Management 0 []  - Isolation Patient Management 0 []  - Hearing / Language / Visual special needs 0 []  - Assessment of Community assistance (transportation, D/C planning, etc.) 0 []  - Additional assistance / Altered mentation 0 []  - Support Surface(s) Assessment (bed, cushion, seat, etc.) 0 INTERVENTIONS - Wound Cleansing /  Measurement []  - Simple Wound Cleansing - one wound 0 X - Complex Wound Cleansing - multiple wounds 3 5 X - Wound Imaging (photographs - any number of wounds) 1 5 []  -  Wound Tracing (instead of photographs) 0 []  - Simple Wound Measurement - one wound 0 X - Complex Wound Measurement - multiple wounds 3 5 INTERVENTIONS - Wound Dressings []  - Small Wound Dressing one or multiple wounds 0 []  - Medium Wound Dressing one or multiple wounds 0 X - Large Wound Dressing one or multiple wounds 3 20 []  - Application of Medications - topical 0 []  - Application of Medications - injection 0 INTERVENTIONS - Miscellaneous []  - External ear exam 0 Balducci, Londen R. ([id number]) []  - Specimen Collection (cultures, biopsies, blood, body fluids, etc.) 0 []  - Specimen(s) / Culture(s) sent or taken to Lab for analysis 0 []  - Patient Transfer (multiple staff / Michiel Sites Lift / Similar devices) 0 []  - Simple Staple / Suture removal (25 or less) 0 []  - Complex Staple / Suture removal (26 or more) 0 []  - Hypo / Hyperglycemic Management (close monitor of Blood Glucose) 0 []  - Ankle / Brachial Index (ABI) - do not check if billed separately 0 X - Vital Signs 1 5 Has the patient been seen at the hospital within the last three years: Yes Total Score: 160 Level Of Care: New/Established - Level 5 Electronic Signature(s) Signed: 09/14/2015 4:39:30 PM By: Alejandro Mulling Entered By: Alejandro Mulling on 09/14/2015 16:31:02 Thorns, Titiana R. ([id number]) -------------------------------------------------------------------------------- Encounter Discharge Information Details Patient Name: Kendra Sabal R. Date of Service: 09/14/2015 1:30 PM Medical Record Number: [id number] Patient Account Number: [id number] Date of Birth/Sex: [birth year] (70 y.o. Female) Treating RN: Ashok Cordia, Debi Primary Care Physician: [facility] Other Clinician: Referring Physician: Theodoro Doing, [facility] Treating Physician/Extender: Rudene Re  in Treatment: 0 Encounter Discharge Information Items Discharge Pain Level: 0 Discharge Condition: Stable Ambulatory Status: Walker Discharge Destination: Home Transportation: Private Auto Accompanied By: caregivers Schedule Follow-up Appointment: Yes Medication Reconciliation completed Yes and provided to Patient/Care Corneisha Alvi: Provided on Clinical Summary of Care: 09/14/2015 Form Type Recipient Paper Patient DP Electronic Signature(s) Signed: 09/14/2015 2:22:30 PM By: Gwenlyn Perking Entered By: Gwenlyn Perking on 09/14/2015 14:22:30 Hazelbaker, Lavere R. ([id number]) -------------------------------------------------------------------------------- Lower Extremity Assessment Details Patient Name: Colborn, Jahara R. Date of Service: 09/14/2015 1:30 PM Medical Record Number: [id number] Patient Account Number: [id number] Date of Birth/Sex: 03-12-1945 (70 y.o. Female) Treating RN: Phillis Haggis Primary Care Physician: Velora Mediate Other Clinician: Referring Physician: Theodoro Doing, [facility] Treating Physician/Extender: Rudene Re in Treatment: 0 Electronic Signature(s) Signed: 09/14/2015 4:39:30 PM By: Alejandro Mulling Entered By: Alejandro Mulling on 09/14/2015 13:40:22 Palmer, Hailie R. ([id number]) -------------------------------------------------------------------------------- Multi Wound Chart Details Patient Name: Kendra Sabal R. Date of Service: 09/14/2015 1:30 PM Medical Record Number: [id number] Patient Account Number: [id number] Date of Birth/Sex: [birth year] (70 y.o. Female) Treating RN: Ashok Cordia, Debi Primary Care Physician: [facility] Other Clinician: Referring Physician: [facility] Treating Physician/Extender: Rudene Re in Treatment: 0 Vital Signs Height(in): 62 Pulse(bpm): 85 Weight(lbs): 251 Blood Pressure 151/60 (mmHg): Body Mass Index(BMI): 46 Temperature(F): 97.5 Respiratory Rate 20 (breaths/min): Photos: [1:No Photos] [2:No Photos] [3:No  Photos] Wound Location: [1:Abdomen - Lower Quadrant - Circumfernential] [2:Pubis] [3:Groin] Wounding Event: [1:Other Lesion] [2:Other Lesion] [3:Other Lesion] Primary Etiology: [1:Hidradenitis] [2:Hidradenitis] [3:Hidradenitis] Comorbid History: [1:Cataracts, Congestive Heart Failure, Hypertension, Type II Diabetes, Osteoarthritis, Neuropathy] [2:Cataracts, Congestive Heart Failure, Hypertension, Type II Diabetes, Osteoarthritis, Neuropathy] [3:Cataracts, Congestive Heart  Failure, Hypertension, Type II Diabetes, Osteoarthritis, Neuropathy] Date Acquired: [1:06/11/2015] [2:06/11/2015] [3:06/11/2015] Weeks of Treatment: [1:0] [2:0] [3:0] Wound Status: [1:Open] [2:Open] [3:Open] Measurements L x W x D 61x30x0.1 [2:10x5x0.2] [3:2x6x0.1] (cm) Area (cm) : [7:4259.563] [2:39.27] [3:9.425] Volume (cm) : [1:143.728] [2:7.854] [  3:0.942] % Reduction in Area: [1:0.00%] [2:0.00%] [3:0.00%] % Reduction in Volume: 0.00% [2:0.00%] [3:0.00%] Classification: [1:Full Thickness With Exposed Support Structures] [2:Full Thickness With Exposed Support Structures] [3:Full Thickness With Exposed Support Structures] HBO Classification: [1:N/A] [2:N/A] [3:Grade 1] Exudate Amount: [1:Large] [2:Large] [3:Large] Exudate Type: [1:Serous] [2:Serous] [3:Serous] Exudate Color: [1:amber] [2:amber] [3:amber] Wound Margin: [1:Indistinct, nonvisible] [2:Indistinct, nonvisible] [3:Indistinct, nonvisible] Granulation Amount: [1:Large (67-100%)] [2:Large (67-100%)] [3:Large (67-100%)] Granulation Quality: [1:Red, Pink] [2:Red, Pink] [3:Red, Pink] Necrotic Amount: Small (1-33%) Small (1-33%) Small (1-33%) Exposed Structures: Fascia: No Fascia: No Fascia: No Fat: No Fat: No Fat: No Tendon: No Tendon: No Tendon: No Muscle: No Muscle: No Muscle: No Joint: No Joint: No Joint: No Bone: No Bone: No Bone: No Limited to Skin Limited to Skin Limited to Skin Breakdown Breakdown Breakdown Epithelialization: None None  None Periwound Skin Texture: Edema: Yes Edema: Yes Edema: Yes Periwound Skin Moist: Yes Moist: Yes Moist: Yes Moisture: Periwound Skin Color: Erythema: Yes Erythema: Yes Erythema: Yes Erythema Location: Circumferential Circumferential Circumferential Temperature: No Abnormality No Abnormality No Abnormality Tenderness on Yes Yes Yes Palpation: Wound Preparation: Ulcer Cleansing: Ulcer Cleansing: Ulcer Cleansing: Rinsed/Irrigated with Rinsed/Irrigated with Rinsed/Irrigated with Saline Saline Saline Topical Anesthetic Applied: None Treatment Notes Electronic Signature(s) Signed: 09/14/2015 4:39:30 PM By: Alejandro Mulling Entered By: Alejandro Mulling on 09/14/2015 13:46:21 Rencher, Amybeth RMarland Kitchen ([id number]) -------------------------------------------------------------------------------- Multi-Disciplinary Care Plan Details Patient Name: Kendra Sabal R. Date of Service: 09/14/2015 1:30 PM Medical Record Number: [id number] Patient Account Number: [id number] Date of Birth/Sex: [birth year] (70 y.o. Female) Treating RN: Ashok Cordia, Debi Primary Care Physician: [facility] Other Clinician: Referring Physician: Theodoro Doing, [facility] Treating Physician/Extender: Rudene Re in Treatment: 0 Active Inactive Orientation to the Wound Care Program Nursing Diagnoses: Knowledge deficit related to the wound healing center program Goals: Patient/caregiver will verbalize understanding of the Wound Healing Center Program Date Initiated: 09/10/2015 Goal Status: Active Interventions: Provide education on orientation to the wound center Notes: Wound/Skin Impairment Nursing Diagnoses: Impaired tissue integrity Knowledge deficit related to ulceration/compromised skin integrity Goals: Patient/caregiver will verbalize understanding of skin care regimen Date Initiated: 09/10/2015 Goal Status: Active Ulcer/skin breakdown will have a volume reduction of 30% by week 4 Date Initiated: 09/10/2015 Goal  Status: Active Ulcer/skin breakdown will have a volume reduction of 50% by week 8 Date Initiated: 09/10/2015 Goal Status: Active Ulcer/skin breakdown will have a volume reduction of 80% by week 12 Date Initiated: 09/10/2015 Goal Status: Active Ulcer/skin breakdown will heal within 14 weeks Date Initiated: 09/10/2015 ARYELLE, FIGG ([id number]) Goal Status: Active Interventions: Assess patient/caregiver ability to obtain necessary supplies Assess patient/caregiver ability to perform ulcer/skin care regimen upon admission and as needed Assess ulceration(s) every visit Provide education on ulcer and skin care Treatment Activities: Referred to DME Nastasha Reising for dressing supplies : 09/10/2015 Skin care regimen initiated : 09/10/2015 Topical wound management initiated : 09/10/2015 Notes: Electronic Signature(s) Signed: 09/14/2015 4:39:30 PM By: Alejandro Mulling Entered By: Alejandro Mulling on 09/14/2015 13:46:10 Sackrider, Tiearra R. ([id number]) -------------------------------------------------------------------------------- Pain Assessment Details Patient Name: Kendra Sabal R. Date of Service: 09/14/2015 1:30 PM Medical Record Number: [id number] Patient Account Number: [id number] Date of Birth/Sex: Mar 23, 1945 (70 y.o. Female) Treating RN: Ashok Cordia, Debi Primary Care Physician: [facility] Other Clinician: Referring Physician: [facility] Treating Physician/Extender: Rudene Re in Treatment: 0 Active Problems Location of Pain Severity and Description of Pain Patient Has Paino No Site Locations With Dressing Change: No Pain Management and Medication Current Pain Management: Notes Topical or injectable lidocaine is offered to patient for acute pain when  surgical debridement is performed. If needed, Patient is instructed to use over the counter pain medication for the following 24-48 hours after debridement. Wound care MDs do not prescribed pain medications. Patient has  chronic pain or uncontrolled pain. Patient has been instructed to make an appointment with their Primary Care Physician for pain management. Electronic Signature(s) Signed: 09/14/2015 4:39:30 PM By: Alejandro Mulling Entered By: Alejandro Mulling on 09/14/2015 13:33:35 Agar, Chandlar RMarland Kitchen ([id number]) -------------------------------------------------------------------------------- Patient/Caregiver Education Details Patient Name: Kendra Sabal R. Date of Service: 09/14/2015 1:30 PM Medical Record Number: [id number] Patient Account Number: [id number] Date of Birth/Gender: 30-May-1945 (70 y.o. Female) Treating RN: Ashok Cordia, Debi Primary Care Physician: [facility] Other Clinician: Referring Physician: Velora Mediate Treating Physician/Extender: Rudene Re in Treatment: 0 Education Assessment Education Provided To: Patient Education Topics Provided Wound/Skin Impairment: Handouts: Other: change dressing as ordered Methods: Demonstration, Explain/Verbal Responses: State content correctly Electronic Signature(s) Signed: 09/14/2015 4:39:30 PM By: Alejandro Mulling Entered By: Alejandro Mulling on 09/14/2015 14:00:56 Goette, Anasophia R. ([id number]) -------------------------------------------------------------------------------- Wound Assessment Details Patient Name: Strothman, Alayna R. Date of Service: 09/14/2015 1:30 PM Medical Record Number: [id number] Patient Account Number: [id number] Date of Birth/Sex: 11-21-1945 (70 y.o. Female) Treating RN: Ashok Cordia, Debi Primary Care Physician: [facility] Other Clinician: Referring Physician: [facility] Treating Physician/Extender: Rudene Re in Treatment: 0 Wound Status Wound Number: 1 Primary Hidradenitis Etiology: Wound Location: Abdomen - Lower Quadrant - Circumfernential Wound Open Status: Wounding Event: Other Lesion Comorbid Cataracts, Congestive Heart Failure, Date Acquired: 06/11/2015 History: Hypertension, Type II  Diabetes, Weeks Of Treatment: 0 Osteoarthritis, Neuropathy Clustered Wound: No Photos Photo Uploaded By: Alejandro Mulling on 09/14/2015 16:21:07 Wound Measurements Length: (cm) 61 Width: (cm) 30 Depth: (cm) 0.1 Area: (cm) 1437.279 Volume: (cm) 143.728 % Reduction in Area: 0% % Reduction in Volume: 0% Epithelialization: None Tunneling: No Undermining: No Wound Description Full Thickness With Exposed Classification: Support Structures Wound Margin: Indistinct, nonvisible Exudate Large Amount: Exudate Type: Serous Exudate Color: amber Foul Odor After Cleansing: No Wound Bed Granulation Amount: Large (67-100%) Exposed Structure Granulation Quality: Red, Pink Fascia Exposed: No Shaker, Conita R. ([id number]) Necrotic Amount: Small (1-33%) Fat Layer Exposed: No Necrotic Quality: Adherent Slough Tendon Exposed: No Muscle Exposed: No Joint Exposed: No Bone Exposed: No Limited to Skin Breakdown Periwound Skin Texture Texture Color No Abnormalities Noted: No No Abnormalities Noted: No Localized Edema: Yes Erythema: Yes Erythema Location: Circumferential Moisture No Abnormalities Noted: No Temperature / Pain Moist: Yes Temperature: No Abnormality Tenderness on Palpation: Yes Wound Preparation Ulcer Cleansing: Rinsed/Irrigated with Saline Topical Anesthetic Applied: None Treatment Notes Wound #1 (Circumferential Abdomen - Lower Quadrant) 1. Cleansed with: Clean wound with Normal Saline 4. Dressing Applied: Aquacel Ag 5. Secondary Dressing Applied ABD Pad 7. Secured with Secretary/administrator) Signed: 09/14/2015 4:39:30 PM By: Alejandro Mulling Entered By: Alejandro Mulling on 09/14/2015 13:45:29 Garnett, Estle R. ([id number]) -------------------------------------------------------------------------------- Wound Assessment Details Patient Name: Bellino, Cynithia R. Date of Service: 09/14/2015 1:30 PM Medical Record Number: [id number] Patient Account  Number: [id number] Date of Birth/Sex: [birth year] (70 y.o. Female) Treating RN: Ashok Cordia, Debi Primary Care Physician: [facility] Other Clinician: Referring Physician: [facility] Treating Physician/Extender: Rudene Re in Treatment: 0 Wound Status Wound Number: 2 Primary Hidradenitis Etiology: Wound Location: Pubis Wound Open Wounding Event: Other Lesion Status: Date Acquired: 06/11/2015 Comorbid Cataracts, Congestive Heart Failure, Weeks Of Treatment: 0 History: Hypertension, Type II Diabetes, Clustered Wound: No Osteoarthritis, Neuropathy Photos Photo Uploaded By: Alejandro Mulling on 09/14/2015 16:21:35 Wound Measurements Length: (cm) 10 Width: (cm) 5 Depth: (  cm) 0.2 Area: (cm) 39.27 Volume: (cm) 7.854 % Reduction in Area: 0% % Reduction in Volume: 0% Epithelialization: None Tunneling: No Undermining: No Wound Description Full Thickness With Exposed Classification: Support Structures Wound Margin: Indistinct, nonvisible Exudate Large Amount: Exudate Type: Serous Exudate Color: amber Foul Odor After Cleansing: No Wound Bed Granulation Amount: Large (67-100%) Exposed Structure Granulation Quality: Red, Pink Fascia Exposed: No Brothers, Fernanda R. ([id number]) Necrotic Amount: Small (1-33%) Fat Layer Exposed: No Necrotic Quality: Adherent Slough Tendon Exposed: No Muscle Exposed: No Joint Exposed: No Bone Exposed: No Limited to Skin Breakdown Periwound Skin Texture Texture Color No Abnormalities Noted: No No Abnormalities Noted: No Localized Edema: Yes Erythema: Yes Erythema Location: Circumferential Moisture No Abnormalities Noted: No Temperature / Pain Moist: Yes Temperature: No Abnormality Tenderness on Palpation: Yes Wound Preparation Ulcer Cleansing: Rinsed/Irrigated with Saline Treatment Notes Wound #2 (Pubis) 1. Cleansed with: Clean wound with Normal Saline 4. Dressing Applied: Aquacel Ag 5. Secondary Dressing  Applied ABD Pad 7. Secured with Secretary/administrator) Signed: 09/14/2015 4:39:30 PM By: Alejandro Mulling Entered By: Alejandro Mulling on 09/14/2015 13:45:46 Skolnik, Sila R. ([id number]) -------------------------------------------------------------------------------- Wound Assessment Details Patient Name: Albano, Allex R. Date of Service: 09/14/2015 1:30 PM Medical Record Number: [id number] Patient Account Number: [id number] Date of Birth/Sex: 03/04/1945 (70 y.o. Female) Treating RN: Ashok Cordia, Debi Primary Care Physician: [facility] Other Clinician: Referring Physician: [facility] Treating Physician/Extender: Rudene Re in Treatment: 0 Wound Status Wound Number: 3 Primary Hidradenitis Etiology: Wound Location: Groin Wound Open Wounding Event: Other Lesion Status: Date Acquired: 06/11/2015 Comorbid Cataracts, Congestive Heart Failure, Weeks Of Treatment: 0 History: Hypertension, Type II Diabetes, Clustered Wound: No Osteoarthritis, Neuropathy Photos Photo Uploaded By: Alejandro Mulling on 09/14/2015 16:22:49 Wound Measurements Length: (cm) 2 Width: (cm) 6 Depth: (cm) 0.1 Area: (cm) 9.425 Volume: (cm) 0.942 % Reduction in Area: 0% % Reduction in Volume: 0% Epithelialization: None Tunneling: No Undermining: No Wound Description Full Thickness With Exposed Foul Odor Af Classification: Support Structures Diabetic Severity Grade 1 (Wagner): Wound Margin: Indistinct, nonvisible Exudate Amount: Large Exudate Type: Serous Exudate Color: amber ter Cleansing: No Wound Bed Granulation Amount: Large (67-100%) Exposed Structure Altieri, Keysha R. ([id number]) Granulation Quality: Red, Pink Fascia Exposed: No Necrotic Amount: Small (1-33%) Fat Layer Exposed: No Necrotic Quality: Adherent Slough Tendon Exposed: No Muscle Exposed: No Joint Exposed: No Bone Exposed: No Limited to Skin Breakdown Periwound Skin Texture Texture Color No  Abnormalities Noted: No No Abnormalities Noted: No Localized Edema: Yes Erythema: Yes Erythema Location: Circumferential Moisture No Abnormalities Noted: No Temperature / Pain Moist: Yes Temperature: No Abnormality Tenderness on Palpation: Yes Wound Preparation Ulcer Cleansing: Rinsed/Irrigated with Saline Treatment Notes Wound #3 (Groin) 1. Cleansed with: Clean wound with Normal Saline 4. Dressing Applied: Aquacel Ag 5. Secondary Dressing Applied ABD Pad 7. Secured with Secretary/administrator) Signed: 09/14/2015 4:39:30 PM By: Alejandro Mulling Entered By: Alejandro Mulling on 09/14/2015 13:46:02 Longanecker, Beverly R. ([id number]) -------------------------------------------------------------------------------- Vitals Details Patient Name: Kendra Sabal R. Date of Service: 09/14/2015 1:30 PM Medical Record Number: [id number] Patient Account Number: [id number] Date of Birth/Sex: 17-Oct-1945 (70 y.o. Female) Treating RN: Ashok Cordia, Debi Primary Care Physician: [facility] Other Clinician: Referring Physician: [facility] Treating Physician/Extender: Rudene Re in Treatment: 0 Vital Signs Time Taken: 13:33 Temperature (F): 97.5 Height (in): 62 Pulse (bpm): 85 Weight (lbs): 251 Respiratory Rate (breaths/min): 20 Body Mass Index (BMI): 45.9 Blood Pressure (mmHg): 151/60 Reference Range: 80 - 120 mg / dl Electronic Signature(s) Signed: 09/14/2015 4:39:30 PM By: Alejandro Mulling  Entered By: Alejandro Mulling on 09/14/2015 13:40:17

## 2015-09-15 NOTE — Progress Notes (Signed)
Kendra, [name] ([id number]) Visit Report for 09/14/2015 Chief Complaint Document Details Patient Name: [name], [name]. Date of Service: 09/14/2015 1:30 PM Medical Record Number: [id number] Patient Account Number: [id number] Date of Birth/Sex: May 09, 1945 (69 y.o. Female) Treating RN: Ashok Cordia, Debi Primary Care Physician: [facility] Other Clinician: Referring Physician: Theodoro Doing, [facility] Treating Physician/Extender: Rudene Re in Treatment: 0 Information Obtained from: Patient Chief Complaint Patients presents for treatment of an open diabetic ulcer the region of both groins for about 3 months Electronic Signature(s) Signed: 09/14/2015 2:17:34 PM By: Evlyn Kanner MD, FACS Entered By: Evlyn Kanner on 09/14/2015 14:17:33 Natarajan, Taneah R. ([id number]) -------------------------------------------------------------------------------- HPI Details Patient Name: Kendra Sabal R. Date of Service: 09/14/2015 1:30 PM Medical Record Number: [id number] Patient Account Number: [id number] Date of Birth/Sex: 27-May-1945 (69 y.o. Female) Treating RN: Ashok Cordia, Debi Primary Care Physician: [facility] Other Clinician: Referring Physician: [facility] Treating Physician/Extender: Rudene Re in Treatment: 0 History of Present Illness Location: bilateral groin and lower abdomen and pannus ulcerations Quality: Patient reports experiencing a sharp pain to affected area(s). Severity: Patient states wound are getting worse. Duration: Patient has had the wound for > 3 months prior to seeking treatment at the wound center Timing: Pain in wound is constant (hurts all the time) Context: The wound appeared gradually over time Modifying Factors: Other treatment(s) tried include:he has seen a dermatologist who has prescribed antifungal, antibiotics and local care Associated Signs and Symptoms: Patient reports having difficulty standing for long periods. HPI Description: 70 year old  patient's who is morbidly obese and has several problems has her usual which pannus which is covering most of her lower abdomen and groin. She has multiple ulceration which are tender and this is in the region of both groins. Has been having appropriate treatment from her dermatologist. She has been referred to as from the [facility] ([place] dermatology service by Dr. Armida Sans. Was seen there for lesions on the groin draining significant amount of fluid and has been on Diflucan and Alcortin A. Her past medical history is significant for asthma, diabetes, eczema, psoriasis, status post hysterectomy and status post ankle surgery. She was noted to have dermatitis located to her trunk with crusted papules and ulcer located in her groin, the largest being on the right side approximately 1 cm. The patient also has intertrigo located to her groin bilaterally. His assessment was that of neurodermatitis, ulcer with Pseudomonas collaterization and for this he had recommended ciprofloxacin for 7 days. He had also started her on Diflucan for 3 weeks. Electronic Signature(s) Signed: 09/14/2015 2:19:47 PM By: Evlyn Kanner MD, FACS Entered By: Evlyn Kanner on 09/14/2015 14:19:46 Millward, Scherrie Gerlach ([id number]) -------------------------------------------------------------------------------- Physical Exam Details Patient Name: Krauss, Jenille R. Date of Service: 09/14/2015 1:30 PM Medical Record Number: [id number] Patient Account Number: [id number] Date of Birth/Sex: 1945/05/07 (69 y.o. Female) Treating RN: Ashok Cordia, Debi Primary Care Physician: [facility] Other Clinician: Referring Physician: [facility] Treating Physician/Extender: Rudene Re in Treatment: 0 Constitutional . Pulse regular. Respirations normal and unlabored. Afebrile. . Eyes Nonicteric. Reactive to light. Ears, Nose, Mouth, and Throat Lips, teeth, and gums WNL.Marland Kitchen Moist mucosa without lesions. Neck supple and nontender. No  palpable supraclavicular or cervical adenopathy. Normal sized without goiter. Respiratory WNL. No retractions.. Breath sounds WNL, No rubs, rales, rhonchi, or wheeze.. Cardiovascular Heart rhythm and rate regular, no murmur or gallop.. Pedal Pulses WNL. No clubbing, cyanosis or edema. Lymphatic No adneopathy. No adenopathy. No adenopathy. Musculoskeletal Adexa without tenderness or enlargement.. Digits and nails w/o clubbing, cyanosis,  infection, petechiae, ischemia, or inflammatory conditions.. Integumentary (Hair, Skin) No suspicious lesions. No crepitus or fluctuance. No peri-wound warmth or erythema. No masses.Marland Kitchen Psychiatric Judgement and insight Intact.. No evidence of depression, anxiety, or agitation.. Notes she has a large pannus which is covering most of her groins and upper thigh and due to this she has multiple areas of ulcerations on the lower part of her pannus and her groins and these are extremely tender. They have healthy granulation tissue and no surrounding cellulitis at the present time. Electronic Signature(s) Signed: 09/14/2015 2:20:37 PM By: Evlyn Kanner MD, FACS Entered By: Evlyn Kanner on 09/14/2015 14:20:37 Strite, Scherrie Gerlach ([id number]) -------------------------------------------------------------------------------- Physician Orders Details Patient Name: Kendra Sabal R. Date of Service: 09/14/2015 1:30 PM Medical Record Number: [id number] Patient Account Number: [id number] Date of Birth/Sex: [birth year] (69 y.o. Female) Treating RN: Ashok Cordia, Debi Primary Care Physician: [facility] Other Clinician: Referring Physician: Theodoro Doing, [facility] Treating Physician/Extender: Rudene Re in Treatment: 0 Verbal / Phone Orders: Yes Clinician: Pinkerton, Debi Read Back and Verified: Yes Diagnosis Coding Wound Cleansing Wound #1 Circumferential Abdomen - Lower Quadrant o Clean wound with Normal Saline. Wound #2 Pubis o Clean wound with Normal  Saline. Wound #3 Groin o Clean wound with Normal Saline. Primary Wound Dressing Wound #1 Circumferential Abdomen - Lower Quadrant o Aquacel Ag Wound #2 Pubis o Aquacel Ag Wound #3 Groin o Aquacel Ag Secondary Dressing Wound #1 Circumferential Abdomen - Lower Quadrant o ABD pad Wound #2 Pubis o ABD pad Wound #3 Groin o ABD pad Dressing Change Frequency Wound #1 Circumferential Abdomen - Lower Quadrant o Change dressing every other day. Wound #2 Pubis o Change dressing every other day. Hayhurst, Lilyanna R. ([id number]) Wound #3 Groin o Change dressing every other day. Follow-up Appointments Wound #1 Circumferential Abdomen - Lower Quadrant o Return Appointment in 1 week. - on Monday Wound #2 Pubis o Return Appointment in 1 week. - on Monday Wound #3 Groin o Return Appointment in 1 week. - on Monday Off-Loading o Turn and reposition every 2 hours Additional Orders / Instructions Wound #1 Circumferential Abdomen - Lower Quadrant o Increase protein intake. o Activity as tolerated Wound #2 Pubis o Increase protein intake. o Activity as tolerated Wound #3 Groin o Increase protein intake. o Activity as tolerated Electronic Signature(s) Signed: 09/14/2015 3:43:28 PM By: Evlyn Kanner MD, FACS Signed: 09/14/2015 4:39:30 PM By: Alejandro Mulling Entered By: Alejandro Mulling on 09/14/2015 13:59:45 Bigler, Mersadez R. ([id number]) -------------------------------------------------------------------------------- Problem List Details Patient Name: YOSHINO, BROCCOLI R. Date of Service: 09/14/2015 1:30 PM Medical Record Number: [id number] Patient Account Number: [id number] Date of Birth/Sex: 26-Dec-1945 (69 y.o. Female) Treating RN: Ashok Cordia, Debi Primary Care Physician: [facility] Other Clinician: Referring Physician: Theodoro Doing, [facility] Treating Physician/Extender: Rudene Re in Treatment: 0 Active Problems ICD-10 Encounter Code  Description Active Date Diagnosis E11.622 Type 2 diabetes mellitus with other skin ulcer 09/14/2015 Yes B36.9 Superficial mycosis, unspecified 09/14/2015 Yes E66.01 Morbid (severe) obesity due to excess calories 09/14/2015 Yes L30.4 Erythema intertrigo 09/14/2015 Yes L03.311 Cellulitis of abdominal wall 09/14/2015 Yes S31.105S Unspecified open wound of abdominal wall, periumbilic 09/14/2015 Yes region without penetration into peritoneal cavity, sequela Inactive Problems Resolved Problems Electronic Signature(s) Signed: 09/14/2015 2:41:52 PM By: Evlyn Kanner MD, FACS Previous Signature: 09/14/2015 2:17:09 PM Version By: Evlyn Kanner MD, FACS Entered By: Evlyn Kanner on 09/14/2015 14:41:51 Caya, Deandrea R. ([id number]) -------------------------------------------------------------------------------- Progress Note Details Patient Name: Cortopassi, Keyunna R. Date of Service: 09/14/2015 1:30 PM Medical Record Number: [id number] Patient Account Number: [id number] Date  of Birth/Sex: 07/01/1945 (69 y.o. Female) Treating RN: Ashok Cordia, Debi Primary Care Physician: [facility] Other Clinician: Referring Physician: [facility] Treating Physician/Extender: Rudene Re in Treatment: 0 Subjective Chief Complaint Information obtained from Patient Patients presents for treatment of an open diabetic ulcer the region of both groins for about 3 months History of Present Illness (HPI) The following HPI elements were documented for the patient's wound: Location: bilateral groin and lower abdomen and pannus ulcerations Quality: Patient reports experiencing a sharp pain to affected area(s). Severity: Patient states wound are getting worse. Duration: Patient has had the wound for > 3 months prior to seeking treatment at the wound center Timing: Pain in wound is constant (hurts all the time) Context: The wound appeared gradually over time Modifying Factors: Other treatment(s) tried include:he has seen  a dermatologist who has prescribed antifungal, antibiotics and local care Associated Signs and Symptoms: Patient reports having difficulty standing for long periods. 70 year old patient's who is morbidly obese and has several problems has her usual which pannus which is covering most of her lower abdomen and groin. She has multiple ulceration which are tender and this is in the region of both groins. Has been having appropriate treatment from her dermatologist. She has been referred to as from the [facility] dermatology service by Dr. Armida Sans. Was seen there for lesions on the groin draining significant amount of fluid and has been on Diflucan and Alcortin A. Her past medical history is significant for asthma, diabetes, eczema, psoriasis, status post hysterectomy and status post ankle surgery. She was noted to have dermatitis located to her trunk with crusted papules and ulcer located in her groin, the largest being on the right side approximately 1 cm. The patient also has intertrigo located to her groin bilaterally. His assessment was that of neurodermatitis, ulcer with Pseudomonas collaterization and for this he had recommended ciprofloxacin for 7 days. He had also started her on Diflucan for 3 weeks. Objective Constitutional Pulse regular. Respirations normal and unlabored. Afebrile. Darling, Jariyah R. ([id number]) Vitals Time Taken: 1:33 PM, Height: 62 in, Weight: 251 lbs, BMI: 45.9, Temperature: 97.5 F, Pulse: 85 bpm, Respiratory Rate: 20 breaths/min, Blood Pressure: 151/60 mmHg. Eyes Nonicteric. Reactive to light. Ears, Nose, Mouth, and Throat Lips, teeth, and gums WNL.Marland Kitchen Moist mucosa without lesions. Neck supple and nontender. No palpable supraclavicular or cervical adenopathy. Normal sized without goiter. Respiratory WNL. No retractions.. Breath sounds WNL, No rubs, rales, rhonchi, or wheeze.. Cardiovascular Heart rhythm and rate regular, no murmur or gallop.. Pedal  Pulses WNL. No clubbing, cyanosis or edema. Lymphatic No adneopathy. No adenopathy. No adenopathy. Musculoskeletal Adexa without tenderness or enlargement.. Digits and nails w/o clubbing, cyanosis, infection, petechiae, ischemia, or inflammatory conditions.Marland Kitchen Psychiatric Judgement and insight Intact.. No evidence of depression, anxiety, or agitation.. General Notes: she has a large pannus which is covering most of her groins and upper thigh and due to this she has multiple areas of ulcerations on the lower part of her pannus and her groins and these are extremely tender. They have healthy granulation tissue and no surrounding cellulitis at the present time. Integumentary (Hair, Skin) No suspicious lesions. No crepitus or fluctuance. No peri-wound warmth or erythema. No masses.. Wound #1 status is Open. Original cause of wound was Other Lesion. The wound is located on the Circumferential Abdomen - Lower Quadrant. The wound measures 61cm length x 30cm width x 0.1cm depth; 1437.279cm^2 area and 143.728cm^3 volume. The wound is limited to skin breakdown. There is no tunneling or  undermining noted. There is a large amount of serous drainage noted. The wound margin is indistinct and nonvisible. There is large (67-100%) red, pink granulation within the wound bed. There is a small (1-33%) amount of necrotic tissue within the wound bed including Adherent Slough. The periwound skin appearance exhibited: Localized Edema, Moist, Erythema. The surrounding wound skin color is noted with erythema which is circumferential. Periwound temperature was noted as No Abnormality. The periwound has tenderness on palpation. Wound #2 status is Open. Original cause of wound was Other Lesion. The wound is located on the Pubis. The wound measures 10cm length x 5cm width x 0.2cm depth; 39.27cm^2 area and 7.854cm^3 volume. The wound is limited to skin breakdown. There is no tunneling or undermining noted. There is a  large amount of serous drainage noted. The wound margin is indistinct and nonvisible. There is large (67-100%) Ceja, Larken R. ([id number]) red, pink granulation within the wound bed. There is a small (1-33%) amount of necrotic tissue within the wound bed including Adherent Slough. The periwound skin appearance exhibited: Localized Edema, Moist, Erythema. The surrounding wound skin color is noted with erythema which is circumferential. Periwound temperature was noted as No Abnormality. The periwound has tenderness on palpation. Wound #3 status is Open. Original cause of wound was Other Lesion. The wound is located on the Groin. The wound measures 2cm length x 6cm width x 0.1cm depth; 9.425cm^2 area and 0.942cm^3 volume. The wound is limited to skin breakdown. There is no tunneling or undermining noted. There is a large amount of serous drainage noted. The wound margin is indistinct and nonvisible. There is large (67-100%) red, pink granulation within the wound bed. There is a small (1-33%) amount of necrotic tissue within the wound bed including Adherent Slough. The periwound skin appearance exhibited: Localized Edema, Moist, Erythema. The surrounding wound skin color is noted with erythema which is circumferential. Periwound temperature was noted as No Abnormality. The periwound has tenderness on palpation. Assessment Active Problems ICD-10 E11.622 - Type 2 diabetes mellitus with other skin ulcer B36.9 - Superficial mycosis, unspecified E66.01 - Morbid (severe) obesity due to excess calories L30.4 - Erythema intertrigo L03.311 - Cellulitis of abdominal wall S31.105S - Unspecified open wound of abdominal wall, periumbilic region without penetration into peritoneal cavity, sequela This 70 year old patient who has her chronic problem with a dermatological condition which is being treated appropriately by her dermatologist. Cultures taken last week did not grow any significant organisms. I  have recommended offloading as much as possible by changing positions from time to time keeping the area dry with constant changing of her undergarments and then using silver alginate pads to help treat the wounds locally. He is encouraged to continue good control of her diabetes mellitus and see her dermatologist on a regular intervals. All their questions have been answered and they will see [place] back next week. Plan Mutch, Reather R. ([id number]) Wound Cleansing: Wound #1 Circumferential Abdomen - Lower Quadrant: Clean wound with Normal Saline. Wound #2 Pubis: Clean wound with Normal Saline. Wound #3 Groin: Clean wound with Normal Saline. Primary Wound Dressing: Wound #1 Circumferential Abdomen - Lower Quadrant: Aquacel Ag Wound #2 Pubis: Aquacel Ag Wound #3 Groin: Aquacel Ag Secondary Dressing: Wound #1 Circumferential Abdomen - Lower Quadrant: ABD pad Wound #2 Pubis: ABD pad Wound #3 Groin: ABD pad Dressing Change Frequency: Wound #1 Circumferential Abdomen - Lower Quadrant: Change dressing every other day. Wound #2 Pubis: Change dressing every other day. Wound #3 Groin: Change dressing every other day.  Follow-up Appointments: Wound #1 Circumferential Abdomen - Lower Quadrant: Return Appointment in 1 week. - on Monday Wound #2 Pubis: Return Appointment in 1 week. - on Monday Wound #3 Groin: Return Appointment in 1 week. - on Monday Off-Loading: Turn and reposition every 2 hours Additional Orders / Instructions: Wound #1 Circumferential Abdomen - Lower Quadrant: Increase protein intake. Activity as tolerated Wound #2 Pubis: Increase protein intake. Activity as tolerated Wound #3 Groin: Increase protein intake. Activity as tolerated ELICE, CRIGGER ([id number]) This 70 year old patient who has her chronic problem with a dermatological condition which is being treated appropriately by her dermatologist. I have recommended offloading as much as possible by  changing positions from time to time keeping the area dry with constant changing of her undergarments and then using silver alginate pads to help treat the wounds locally. He is encouraged to continue good control of her diabetes mellitus and see her dermatologist on a regular intervals. All their questions have been answered and they will see [place] back next week. Electronic Signature(s) Signed: 09/14/2015 2:43:19 PM By: Evlyn Kanner MD, FACS Previous Signature: 09/14/2015 2:42:52 PM Version By: Evlyn Kanner MD, FACS Previous Signature: 09/14/2015 2:22:18 PM Version By: Evlyn Kanner MD, FACS Entered By: Evlyn Kanner on 09/14/2015 14:43:19 Zink, Manika RMarland Kitchen ([id number]) -------------------------------------------------------------------------------- SuperBill Details Patient Name: Kendra Sabal R. Date of Service: 09/14/2015 Medical Record Number: [id number] Patient Account Number: [id number] Date of Birth/Sex: [birth year] (69 y.o. Female) Treating RN: Ashok Cordia, Debi Primary Care Physician: [facility] Other Clinician: Referring Physician: Theodoro Doing, [facility] Treating Physician/Extender: Rudene Re in Treatment: 0 Diagnosis Coding ICD-10 Codes Code Description E11.622 Type 2 diabetes mellitus with other skin ulcer B36.9 Superficial mycosis, unspecified E66.01 Morbid (severe) obesity due to excess calories L30.4 Erythema intertrigo L03.311 Cellulitis of abdominal wall Unspecified open wound of abdominal wall, periumbilic region without penetration into S31.105S peritoneal cavity, sequela Facility Procedures CPT4 Code: [id number] Description: 27782 - WOUND CARE VISIT-LEV 5 EST PT Modifier: Quantity: 1 Physician Procedures CPT4: Description Modifier Quantity Code [id number] - WC PHYS LEVEL 3 - EST PT 1 ICD-10 Description Diagnosis E11.622 Type 2 diabetes mellitus with other skin ulcer B36.9 Superficial mycosis, unspecified E66.01 Morbid (severe) obesity due to excess  calories  S31.105S Unspecified open wound of abdominal wall, periumbilic region without penetration into peritoneal cavity, sequela Electronic Signature(s) Signed: 09/14/2015 4:39:30 PM By: Alejandro Mulling Previous Signature: 09/14/2015 2:42:12 PM Version By: Evlyn Kanner MD, FACS Entered By: Alejandro Mulling on 09/14/2015 16:31:20

## 2015-09-17 ENCOUNTER — Ambulatory Visit: Payer: Medicare Other | Admitting: Surgery

## 2015-09-21 ENCOUNTER — Ambulatory Visit: Payer: Medicare Other | Admitting: Surgery

## 2015-09-24 ENCOUNTER — Ambulatory Visit: Payer: Medicare Other | Admitting: Surgery

## 2015-10-05 ENCOUNTER — Ambulatory Visit (INDEPENDENT_AMBULATORY_CARE_PROVIDER_SITE_OTHER): Payer: Medicare Other | Admitting: Family

## 2015-10-05 ENCOUNTER — Encounter (INDEPENDENT_AMBULATORY_CARE_PROVIDER_SITE_OTHER): Payer: Self-pay

## 2015-10-05 ENCOUNTER — Encounter: Payer: Self-pay | Admitting: Family

## 2015-10-05 VITALS — BP 138/76 | HR 90 | Temp 98.7°F | Wt 250.0 lb

## 2015-10-05 DIAGNOSIS — Z794 Long term (current) use of insulin: Secondary | ICD-10-CM

## 2015-10-05 DIAGNOSIS — L138 Other specified bullous disorders: Secondary | ICD-10-CM | POA: Insufficient documentation

## 2015-10-05 DIAGNOSIS — F32A Depression, unspecified: Secondary | ICD-10-CM | POA: Insufficient documentation

## 2015-10-05 DIAGNOSIS — R21 Rash and other nonspecific skin eruption: Secondary | ICD-10-CM | POA: Diagnosis not present

## 2015-10-05 DIAGNOSIS — E1165 Type 2 diabetes mellitus with hyperglycemia: Secondary | ICD-10-CM

## 2015-10-05 DIAGNOSIS — E1140 Type 2 diabetes mellitus with diabetic neuropathy, unspecified: Secondary | ICD-10-CM | POA: Insufficient documentation

## 2015-10-05 DIAGNOSIS — IMO0002 Reserved for concepts with insufficient information to code with codable children: Secondary | ICD-10-CM | POA: Insufficient documentation

## 2015-10-05 DIAGNOSIS — I509 Heart failure, unspecified: Secondary | ICD-10-CM

## 2015-10-05 DIAGNOSIS — F329 Major depressive disorder, single episode, unspecified: Secondary | ICD-10-CM | POA: Insufficient documentation

## 2015-10-05 MED ORDER — MUPIROCIN 2 % EX OINT: TOPICAL_OINTMENT | Freq: Two times a day (BID) | CUTANEOUS | Status: DC

## 2015-10-05 NOTE — Assessment & Plan Note (Signed)
On hydrochlorothiazide, Lasix, K Dur. Follows with cardiology. No lower charity swelling. No adventitious lung sounds to suggest acute exacerbation.

## 2015-10-05 NOTE — Patient Instructions (Addendum)
Please get mammogram and colonoscopy and vaccination records.   Referral placed. Please call dermatology for follow up while we wait for you to be seen by bariatric center.   Continue dressing changes and Nystatin for moisture control.   Mupirocin for antibiotic coverage; please let me know immediately if signs of infection as discussed.   If there is no improvement in your symptoms, or if there is any worsening of symptoms, or if you have any additional concerns, please return for re-evaluation; or, if we are closed, consider going to the Emergency Room for evaluation if symptoms urgent.

## 2015-10-05 NOTE — Assessment & Plan Note (Signed)
On Zoloft. °

## 2015-10-05 NOTE — Assessment & Plan Note (Addendum)
Rash is nonspecific at this time. Suspect candidiasis due to location. Likely worsened by DM2. Reassured that she no fever, chills. I discussed with patient and caregiver that I could not see this rash improving over time as it seems to be exacerbated by the patient's large pannus. patient, caregiver, and I agreed a referral to bariatric surgical group for consult as to what the patient may be a good candidate for was appropriate next step. I also encouraged her to follow back up with dermatology on a routine basis for the most efficacious treatment.  Patient hasnt been able to be seen by [facility] W[facility] care; this referral was made by [facility]. I placed another referral today to wound care. No signs or symptoms suggest bacterial superinfection. Advised patient to continue with nystatin and dressing changes. I did write a prescription for mupirocin to cover the patient is any signs or symptoms of infection. She will follow-up in 2 weeks for me to see rash as well as to cover screening labs and review screening tests.

## 2015-10-05 NOTE — Progress Notes (Addendum)
Subjective:    Patient ID: [name], female    DOB: December 24, 1945, 70 y.o.   MRN: [id number]  CC: [name] is a 70 y.o. female who presents today to establish care.   HPI: Patient presents to establish care and follow up with blistery rash. Transfered care from [facility]. Accompanied by with caregiver.   Rash started 6 months ago under pannus and between legs. Started as skin tear and now appears to be painful blisters on legs and arms. Worsening. Bleeds when touched. No fever, chills.   Complex h/o of rash with specialists, wound center, ED. Per caregiver and patient, patient has been in the emergency room for this rash. I reviewed records and they thought rash c/w pressure ulcer from pannus. Advised to continue nystatin, and diflucan. Had been seen by dermatology; given recommendation to start bleach bath which did not help and one month later blisters started coming up. Dermatology had also given her a cream ( cannot recall name) which 'helped clear up sores'.  Dermatology referred to a wound clinic. Wound clinic then referred her to plastic surgery. She has been unable to make those appointments and frustrated by so many referrals.   History of heart failure and follows with cardiology. Known murmur. She endorses some orthopnea and sleeps propped up. She denies cough, wheezing, shortness of breath, LE swelling.          HISTORY:  Past Medical History:  Diagnosis Date  . Asthma   . Diabetes mellitus without complication (HCC)   . GERD (gastroesophageal reflux disease)   . Heart murmur   . History of hiatal hernia   . Hypertension   . Panic attacks   . Peripheral vascular disease (HCC)   . Restless leg syndrome   . Shortness of breath dyspnea    Past Surgical History:  Procedure Laterality Date  . ABDOMINAL HYSTERECTOMY    . BREAST BIOPSY Right yrs ago   benign  . ENDARTERECTOMY Right 07/24/2014   Procedure: ENDARTERECTOMY CAROTID;  Surgeon:  Annice Needy, MD;  Location: [facility] ORS;  Service: Vascular;  Laterality: Right;  . EYE SURGERY Left    cataract  . FRACTURE SURGERY Left    fractured ankle  . TONSILLECTOMY     Family History  Problem Relation Age of Onset  . Pancreatic cancer Mother   . CAD Father   . Hypertension Father   . Pancreatic cancer Father   . CAD Brother   . Breast cancer Maternal Aunt     pt states several maternal aunts    Allergies: Codeine and Wheat bran Current Outpatient Prescriptions on File Prior to Visit  Medication Sig Dispense Refill  . aspirin EC 81 MG tablet Take 81 mg by mouth daily.    . fluconazole (DIFLUCAN) 150 MG tablet Take 150 mg by mouth every Wednesday. For 3 weeks    . furosemide (LASIX) 40 MG tablet Take 1 tablet (40 mg total) by mouth 2 (two) times daily. 60 tablet 2  . gabapentin (NEURONTIN) 800 MG tablet Take 800 mg by mouth 2 (two) times daily.    Marland Kitchen glimepiride (AMARYL) 2 MG tablet Take 2 mg by mouth 2 (two) times daily.    . hydrochlorothiazide (HYDRODIURIL) 12.5 MG tablet Take 12.5 mg by mouth daily.    Marland Kitchen lovastatin (MEVACOR) 20 MG tablet Take 20 mg by mouth at bedtime.    . metFORMIN (GLUCOPHAGE-XR) 500 MG 24 hr tablet Take 1,000 mg by mouth  2 (two) times daily.    . naproxen (NAPROSYN) 500 MG tablet Take 500 mg by mouth daily.    Marland Kitchen nystatin (MYCOSTATIN/NYSTOP) powder Apply topically 2 (two) times daily.    Marland Kitchen omeprazole (PRILOSEC) 40 MG capsule Take 40 mg by mouth at bedtime.     . potassium chloride SA (K-DUR,KLOR-CON) 20 MEQ tablet Take 2 tablets (40 mEq total) by mouth daily. (Patient not taking: Reported on 08/31/2015) 30 tablet 0  . sertraline (ZOLOFT) 25 MG tablet Take 25 mg by mouth daily.    Marland Kitchen triamcinolone cream (KENALOG) 0.1 % Apply topically 2 (two) times daily.     No current facility-administered medications on file prior to visit.     Social History  Substance Use Topics  . Smoking status: Never Smoker  . Smokeless tobacco: Never Used  . Alcohol use No      Review of Systems  Constitutional: Negative for chills and fever.  Respiratory: Negative for cough.   Cardiovascular: Negative for chest pain and palpitations.  Gastrointestinal: Negative for nausea and vomiting.      Objective:    BP 138/76 (BP Location: Left Arm, Patient Position: Sitting, Cuff Size: Large)   Pulse 90   Temp 98.7 F (37.1 C) (Oral)   Wt 250 lb (113.4 kg)   BMI 45.73 kg/m  BP Readings from Last 3 Encounters:  10/05/15 138/76  08/31/15 (!) 165/71  03/15/15 (!) 122/55   Wt Readings from Last 3 Encounters:  10/05/15 250 lb (113.4 kg)  08/31/15 240 lb (108.9 kg)  03/15/15 276 lb (125.2 kg)    Physical Exam  Constitutional: She appears well-developed and well-nourished.  Eyes: Conjunctivae are normal.  Cardiovascular: Normal rate, regular rhythm, normal heart sounds and normal pulses.   Pulmonary/Chest: Effort normal and breath sounds normal. She has no wheezes. She has no rhonchi. She has no rales.  Neurological: She is alert.  Skin: Skin is warm and dry. Lesion noted.  Multiple ulcers, nonbleeding under pannus varying from 1 cm to 3 cm in diameter. Right lower extremity 3 cm in diameter ulcer. Surrounding skin intact. No purulent discharge or increased warmth.  Psychiatric: She has a normal mood and affect. Her speech is normal and behavior is normal. Thought content normal.  Vitals reviewed.      Assessment & Plan:   Problem List Items Addressed This Visit      Cardiovascular and Mediastinum   CHF (congestive heart failure) (HCC)    On hydrochlorothiazide, Lasix, K Dur. Follows with cardiology. No lower charity swelling. No adventitious lung sounds to suggest acute exacerbation.        Musculoskeletal and Integument   Rash and nonspecific skin eruption - Primary    Rash is nonspecific at this time. Suspect candidiasis due to location. Likely worsened by DM2. Reassured that she no fever, chills. I discussed with patient and caregiver that I  could not see this rash improving over time as it seems to be exacerbated by the patient's large pannus. patient, caregiver, and I agreed a referral to bariatric surgical group for consult as to what the patient may be a good candidate for was appropriate next step. I also encouraged her to follow back up with dermatology on a routine basis for the most efficacious treatment.  Patient hasnt been able to be seen by [facility] care; this referral was made by [facility]. I placed another referral today to wound care. No signs or symptoms suggest bacterial superinfection. Advised patient to continue  with nystatin and dressing changes. I did write a prescription for mupirocin to cover the patient is any signs or symptoms of infection. She will follow-up in 2 weeks for me to see rash as well as to cover screening labs and review screening tests.      Relevant Medications   mupirocin ointment (BACTROBAN) 2 %   Other Relevant Orders   Ambulatory referral to General Surgery   Ambulatory referral to Wound Clinic    Other Visit Diagnoses   None.      I am having Ms. Jayson maintain her glimepiride, hydrochlorothiazide, omeprazole, lovastatin, aspirin EC, metFORMIN, furosemide, potassium chloride SA, fluconazole, gabapentin, nystatin, sertraline, triamcinolone cream, and naproxen. We will continue to administer mupirocin ointment.   Meds ordered this encounter  Medications  . mupirocin ointment (BACTROBAN) 2 %    Return precautions given.   Risks, benefits, and alternatives of the medications and treatment plan prescribed today were discussed, and patient expressed understanding.   Education regarding symptom management and diagnosis given to patient on AVS.  Continue to follow with Rennie Plowman, FNP for routine health maintenance.   Scherrie Gerlach Merolla and I agreed with plan.   Rennie Plowman, FNP

## 2015-10-05 NOTE — Assessment & Plan Note (Signed)
On metformin. We'll check A1c today.

## 2015-10-05 NOTE — Assessment & Plan Note (Deleted)
On hydrochlorothiazide, Lasix, K Dur.

## 2015-10-06 ENCOUNTER — Telehealth: Payer: Self-pay | Admitting: *Deleted

## 2015-10-06 DIAGNOSIS — R21 Rash and other nonspecific skin eruption: Secondary | ICD-10-CM

## 2015-10-06 MED ORDER — MUPIROCIN CALCIUM 2 % EX CREA: 1.0000 "application " | TOPICAL_CREAM | Freq: Three times a day (TID) | CUTANEOUS | 0 refills | Status: DC

## 2015-10-06 NOTE — Telephone Encounter (Signed)
Please let patient know ( she called 8/8) that I sent in Rx for ointment to [facility] one more time; they should have now.   Apologize for inconvenience.

## 2015-10-06 NOTE — Telephone Encounter (Signed)
Patient stated that she was to receive a ointment ,however the pharmacy did not receive the script.  Pharmacy [facility] [address]

## 2015-10-07 NOTE — Telephone Encounter (Signed)
Left message for patient to return call back.  

## 2015-10-09 NOTE — Telephone Encounter (Signed)
Patient has been informed.

## 2015-10-09 NOTE — Telephone Encounter (Signed)
Insurance will not pay for it so she is wondering if she can receive something else?

## 2015-10-11 NOTE — Telephone Encounter (Signed)
Please call patient and let her know-  Voltaren gel is great however we tend to have problems with insurance even when I prescribe generic.   I would try the following over the counter medications -  Capsaicin  thermacare hot patches  Icy hot

## 2015-10-12 NOTE — Telephone Encounter (Signed)
Unable to leave voicemail due to memory being too full.

## 2015-10-13 NOTE — Telephone Encounter (Signed)
Unable to leave voicemail due to memory being too full. 

## 2015-10-14 NOTE — Telephone Encounter (Signed)
Patient has been notified

## 2015-10-15 ENCOUNTER — Encounter: Payer: Medicare Other | Admitting: General Surgery

## 2015-10-17 ENCOUNTER — Encounter (INDEPENDENT_AMBULATORY_CARE_PROVIDER_SITE_OTHER): Payer: Self-pay

## 2015-10-17 DIAGNOSIS — K219 Gastro-esophageal reflux disease without esophagitis: Secondary | ICD-10-CM | POA: Insufficient documentation

## 2015-10-17 DIAGNOSIS — E7800 Pure hypercholesterolemia, unspecified: Secondary | ICD-10-CM

## 2015-10-19 ENCOUNTER — Encounter: Payer: Medicare Other | Attending: Surgery | Admitting: Surgery

## 2015-10-19 DIAGNOSIS — I509 Heart failure, unspecified: Secondary | ICD-10-CM | POA: Diagnosis not present

## 2015-10-19 DIAGNOSIS — B369 Superficial mycosis, unspecified: Secondary | ICD-10-CM | POA: Diagnosis not present

## 2015-10-19 DIAGNOSIS — L03311 Cellulitis of abdominal wall: Secondary | ICD-10-CM | POA: Diagnosis not present

## 2015-10-19 DIAGNOSIS — S31105S Unspecified open wound of abdominal wall, periumbilic region without penetration into peritoneal cavity, sequela: Secondary | ICD-10-CM | POA: Insufficient documentation

## 2015-10-19 DIAGNOSIS — L304 Erythema intertrigo: Secondary | ICD-10-CM | POA: Diagnosis not present

## 2015-10-19 DIAGNOSIS — E11622 Type 2 diabetes mellitus with other skin ulcer: Secondary | ICD-10-CM | POA: Insufficient documentation

## 2015-10-19 DIAGNOSIS — E039 Hypothyroidism, unspecified: Secondary | ICD-10-CM | POA: Insufficient documentation

## 2015-10-19 DIAGNOSIS — X58XXXS Exposure to other specified factors, sequela: Secondary | ICD-10-CM | POA: Insufficient documentation

## 2015-10-19 DIAGNOSIS — E785 Hyperlipidemia, unspecified: Secondary | ICD-10-CM | POA: Diagnosis not present

## 2015-10-19 DIAGNOSIS — Z6841 Body Mass Index (BMI) 40.0 and over, adult: Secondary | ICD-10-CM | POA: Insufficient documentation

## 2015-10-19 DIAGNOSIS — I110 Hypertensive heart disease with heart failure: Secondary | ICD-10-CM | POA: Insufficient documentation

## 2015-10-24 NOTE — Progress Notes (Signed)
[name], [name] ([id number]) Visit Report for 10/19/2015 Arrival Information Details Patient Name: [name], [name] Date of Service: 10/19/2015 3:00 PM Medical Record Number: [id number] Patient Account Number: [id number] Date of Birth/Sex: 07/13/1945 (69 y.o. Female) Treating RN: Georgana CurioAustin, Betsy Primary Care Physician: Rennie PlowmanARNETT, MARGARET Other Clinician: Referring Physician: Rennie PlowmanARNETT, MARGARET Treating Physician/Extender: Elayne SnarePARKER, PETER Weeks in Treatment: 5 Visit Information History Since Last Visit All ordered tests and consults were completed: No Patient Arrived: Dan HumphreysWalker Added or deleted any medications: No Arrival Time: 15:23 Any new allergies or adverse reactions: No Accompanied By: caregiver Had a fall or experienced change in No Transfer Assistance: None activities of daily living that may affect Patient Identification Verified: Yes risk of falls: Secondary Verification Process Yes Signs or symptoms of abuse/neglect since last No Completed: visito Patient Requires Transmission-Based No Has Dressing in Place as Prescribed: Yes Precautions: Pain Present Now: Yes Patient Has Alerts: No Electronic Signature(s) Signed: 10/19/2015 4:58:52 PM By: Georgana CurioAustin, Betsy Entered By: Georgana CurioAustin, Betsy on 10/19/2015 15:27:30 [name], [name] ([id number]) -------------------------------------------------------------------------------- Clinic Level of Care Assessment Details Patient Name: Kendra SabalSCHAL, [name] Date of Service: 10/19/2015 3:00 PM Medical Record Number: [id number] Patient Account Number: [id number] Date of Birth/Sex: 01/21/1946 (69 y.o. Female) Treating RN: Georgana CurioAustin, Betsy Primary Care Physician: Rennie PlowmanARNETT, MARGARET Other Clinician: Referring Physician: Rennie PlowmanARNETT, MARGARET Treating Physician/Extender: Elayne SnarePARKER, PETER Weeks in Treatment: 5 Clinic Level of Care Assessment Items TOOL 4 Quantity Score X - Use when only an EandM is performed on FOLLOW-UP visit 1 0 ASSESSMENTS - Nursing Assessment /  Reassessment X - Reassessment of Co-morbidities (includes updates in patient status) 1 10 X - Reassessment of Adherence to Treatment Plan 1 5 ASSESSMENTS - Wound and Skin Assessment / Reassessment []  - Simple Wound Assessment / Reassessment - one wound 0 X - Complex Wound Assessment / Reassessment - multiple wounds 3 5 []  - Dermatologic / Skin Assessment (not related to wound area) 0 ASSESSMENTS - Focused Assessment []  - Circumferential Edema Measurements - multi extremities 0 []  - Nutritional Assessment / Counseling / Intervention 0 []  - Lower Extremity Assessment (monofilament, tuning fork, pulses) 0 []  - Peripheral Arterial Disease Assessment (using hand held doppler) 0 ASSESSMENTS - Ostomy and/or Continence Assessment and Care []  - Incontinence Assessment and Management 0 []  - Ostomy Care Assessment and Management (repouching, etc.) 0 PROCESS - Coordination of Care []  - Simple Patient / Family Education for ongoing care 0 X - Complex (extensive) Patient / Family Education for ongoing care 1 20 X - Staff obtains ChiropractorConsents, Records, Test Results / Process Orders 1 10 X - Staff telephones HHA, Nursing Homes / Clarify orders / etc 1 10 []  - Routine Transfer to another Facility (non-emergent condition) 0 [name], [name] []  - Routine Hospital Admission (non-emergent condition) 0 []  - New Admissions / Manufacturing engineernsurance Authorizations / Ordering NPWT, Apligraf, etc. 0 []  - Emergency Hospital Admission (emergent condition) 0 X - Simple Discharge Coordination 1 10 []  - Complex (extensive) Discharge Coordination 0 PROCESS - Special Needs []  - Pediatric / Minor Patient Management 0 []  - Isolation Patient Management 0 []  - Hearing / Language / Visual special needs 0 []  - Assessment of Community assistance (transportation, D/C planning, etc.) 0 []  - Additional assistance / Altered mentation 0 []  - Support Surface(s) Assessment (bed, cushion, seat, etc.) 0 INTERVENTIONS - Wound Cleansing  / Measurement []  - Simple Wound Cleansing - one wound 0 X - Complex Wound Cleansing - multiple wounds 3 5 X - Wound Imaging (photographs - any number of  wounds) 1 5 []  - Wound Tracing (instead of photographs) 0 []  - Simple Wound Measurement - one wound 0 X - Complex Wound Measurement - multiple wounds 3 5 INTERVENTIONS - Wound Dressings []  - Small Wound Dressing one or multiple wounds 0 X - Medium Wound Dressing one or multiple wounds 3 15 []  - Large Wound Dressing one or multiple wounds 0 X - Application of Medications - topical 1 5 []  - Application of Medications - injection 0 INTERVENTIONS - Miscellaneous []  - External ear exam 0 [name], [name] ( ) []  - Specimen Collection (cultures, biopsies, blood, body fluids, etc.) 0 []  - Specimen(s) / Culture(s) sent or taken to Lab for analysis 0 []  - Patient Transfer (multiple staff / Lift / Similar devices) 0 []  - Simple Staple / Suture removal (25 or less) 0 []  - Complex Staple / Suture removal (26 or more) 0 []  - Hypo / Hyperglycemic Management (close monitor of Blood Glucose) 0 []  - Ankle / Brachial Index (ABI) - do not check if billed separately 0 X - Vital Signs 1 5 Has the patient been seen at the hospital within the last three years: Yes Total Score: 170 Level Of Care: New/Established - Level 5 Electronic Signature(s) Signed: 10/19/2015 4:58:52 PM By: Entered By: on 10/19/2015 16:14:21 [name], [name] -------------------------------------------------------------------------------- Encounter Discharge Information Details Patient Name: R. Date of Service: 10/19/2015 3:00 PM Medical Record Number: Patient Account Number: Michiel Sites Date of Birth/Sex: [birth year] (69 y.o. Female) Treating RN: Primary Care Physician: Other Clinician: Referring Physician: 10/21/2015 Treating Physician/Extender: Georgana Curio  in Treatment: 5 Encounter Discharge Information Items Discharge Pain Level: 0 Discharge Condition: Stable Ambulatory Status: [name] Discharge Destination: Home Transportation: Private Auto Accompanied By: caregiver Schedule Follow-up Appointment: Yes Medication Reconciliation completed and Yes provided to Patient/Care Inari Shin: Patient Clinical Summary of Care: Declined Electronic Signature(s) Signed: 10/19/2015 4:58:52 PM By: 10/21/2015 Entered By: [id number] on 10/19/2015 16:16:19 [name], [name] (10/21/2015) -------------------------------------------------------------------------------- Lower Extremity Assessment Details Patient Name: [id number] R. Date of Service: 10/19/2015 3:00 PM Medical Record Number: 14/08/1945 Patient Account Number: [id number] Date of Birth/Sex: 1946/02/19 (69 y.o. Female) Treating RN: Rennie Plowman Primary Care Physician: Elayne Snare Other Clinician: Referring Physician: 10/21/2015 Treating Physician/Extender: Georgana Curio Weeks in Treatment: 5 Electronic Signature(s) Signed: 10/19/2015 4:58:52 PM By: 10/21/2015 Entered By: [id number] on 10/19/2015 15:30:56 [name], [name] (10/21/2015) -------------------------------------------------------------------------------- Multi Wound Chart Details Patient Name: [id number] R. Date of Service: 10/19/2015 3:00 PM Medical Record Number: 14/08/1945 Patient Account Number: [id number] Date of Birth/Sex: 08-Mar-1945 (69 y.o. Female) Treating RN: Rennie Plowman Primary Care Physician: Ardath Sax Other Clinician: Referring Physician: 10/21/2015 Treating Physician/Extender: Georgana Curio in Treatment: 5 Vital Signs Height(in): 62 Capillary Blood 188 Glucose(mg/dl): Weight(lbs): Georgana Curio Pulse(bpm): 88 Body Mass Index(BMI): 46 Blood Pressure Temperature(F): 97.9 156/60 (mmHg): Respiratory Rate 20 (breaths/min): Photos: [1:No Photos] [2:No Photos] [3:No  Photos] Wound Location: [1:Abdomen - Lower Quadrant - Circumfernential] [2:Pubis] [3:Groin] Wounding Event: [1:Other Lesion] [2:Other Lesion] [3:Other Lesion] Primary Etiology: [1:Hidradenitis] [2:Hidradenitis] [3:Hidradenitis] Comorbid History: [1:Cataracts, Congestive Heart Failure, Hypertension, Type II Diabetes, Osteoarthritis, Neuropathy] [2:Cataracts, Congestive Heart Failure, Hypertension, Type II Diabetes, Osteoarthritis, Neuropathy] [3:Cataracts, Congestive Heart  Failure, Hypertension, Type II Diabetes, Osteoarthritis, Neuropathy] Date Acquired: [1:06/11/2015] [2:06/11/2015] [3:06/11/2015] Weeks of Treatment: [1:5] [2:5] [3:5] Wound Status: [1:Open] [2:Open] [3:Open] Measurements L x W x D 47x11x0.1 [2:9x5x0.1] [3:5x5.2x0.1] (cm) Area (cm) : [1:406.051] [2:35.343] [3:20.42] Volume (cm) : [  1:40.605] [2:3.534] [3:2.042] % Reduction in Area: [1:71.70%] [2:10.00%] [3:-116.70%] % Reduction in Volume: 71.70% [2:55.00%] [3:-116.80%] Classification: [1:Full Thickness With Exposed Support Structures] [2:Full Thickness With Exposed Support Structures] [3:Full Thickness With Exposed Support Structures] HBO Classification: [1:N/A] [2:N/A] [3:Grade 1] Exudate Amount: [1:Large] [2:Large] [3:Large] Exudate Type: [1:Serosanguineous] [2:Serous] [3:Serosanguineous] Exudate Color: [1:red, brown] [2:amber] [3:red, brown] Wound Margin: [1:Indistinct, nonvisible] [2:Indistinct, nonvisible] [3:Indistinct, nonvisible] Granulation Amount: [1:Large (67-100%)] [2:Large (67-100%)] [3:Large (67-100%)] Granulation Quality: [1:Red, Pink] [2:Red, Pink] [3:Red, Pink] Necrotic Amount: Small (1-33%) Small (1-33%) Small (1-33%) Exposed Structures: Fascia: No Fascia: No Fascia: No Fat: No Fat: No Fat: No Tendon: No Tendon: No Tendon: No Muscle: No Muscle: No Muscle: No Joint: No Joint: No Joint: No Bone: No Bone: No Bone: No Limited to Skin Limited to Skin Limited to Skin Breakdown Breakdown  Breakdown Epithelialization: Large (67-100%) Medium (34-66%) None Periwound Skin Texture: Edema: Yes Edema: Yes Edema: Yes Periwound Skin Moist: Yes Moist: Yes Moist: Yes Moisture: Periwound Skin Color: Erythema: Yes Erythema: Yes Erythema: Yes Erythema Location: Circumferential Circumferential Circumferential Temperature: No Abnormality No Abnormality No Abnormality Tenderness on Yes Yes Yes Palpation: Wound Preparation: Ulcer Cleansing: Ulcer Cleansing: Ulcer Cleansing: Rinsed/Irrigated with Rinsed/Irrigated with Rinsed/Irrigated with Saline Saline Saline Topical Anesthetic Topical Anesthetic Topical Anesthetic Applied: None, Other: Applied: Other: lidocaine 4 Applied: Other: lidocaine lidocaine 4% % 4% Treatment Notes Electronic Signature(s) Signed: 10/19/2015 4:58:52 PM By: Georgana Curio Entered By: Georgana Curio on 10/19/2015 15:45:17 [name], [name] ([id number]) -------------------------------------------------------------------------------- Multi-Disciplinary Care Plan Details Patient Name: [name], [name] Date of Service: 10/19/2015 3:00 PM Medical Record Number: [id number] Patient Account Number: [id number] Date of Birth/Sex: May 23, 1945 (69 y.o. Female) Treating RN: Georgana Curio Primary Care Physician: Rennie Plowman Other Clinician: Referring Physician: Rennie Plowman Treating Physician/Extender: Elayne Snare in Treatment: 5 Active Inactive Orientation to the Wound Care Program Nursing Diagnoses: Knowledge deficit related to the wound healing center program Goals: Patient/caregiver will verbalize understanding of the Wound Healing Center Program Date Initiated: 09/10/2015 Goal Status: Active Interventions: Provide education on orientation to the wound center Notes: Wound/Skin Impairment Nursing Diagnoses: Impaired tissue integrity Knowledge deficit related to ulceration/compromised skin integrity Goals: Patient/caregiver will verbalize  understanding of skin care regimen Date Initiated: 09/10/2015 Goal Status: Active Ulcer/skin breakdown will have a volume reduction of 30% by week 4 Date Initiated: 09/10/2015 Goal Status: Active Ulcer/skin breakdown will have a volume reduction of 50% by week 8 Date Initiated: 09/10/2015 Goal Status: Active Ulcer/skin breakdown will have a volume reduction of 80% by week 12 Date Initiated: 09/10/2015 Goal Status: Active Ulcer/skin breakdown will heal within 14 weeks Date Initiated: 09/10/2015 [name], [name] ([id number]) Goal Status: Active Interventions: Assess patient/caregiver ability to obtain necessary supplies Assess patient/caregiver ability to perform ulcer/skin care regimen upon admission and as needed Assess ulceration(s) every visit Provide education on ulcer and skin care Treatment Activities: Referred to DME Donnell Wion for dressing supplies : 09/10/2015 Skin care regimen initiated : 09/10/2015 Topical wound management initiated : 09/10/2015 Notes: Electronic Signature(s) Signed: 10/19/2015 4:58:52 PM By: Georgana Curio Entered By: Georgana Curio on 10/19/2015 15:42:52 [name], [name] ([id number]) -------------------------------------------------------------------------------- Pain Assessment Details Patient Name: [name] R. Date of Service: 10/19/2015 3:00 PM Medical Record Number: [id number] Patient Account Number: [id number] Date of Birth/Sex: 1946/01/16 (69 y.o. Female) Treating RN: Georgana Curio Primary Care Physician: Rennie Plowman Other Clinician: Referring Physician: Rennie Plowman Treating Physician/Extender: Elayne Snare in Treatment: 5 Active Problems Location of Pain Severity and Description of Pain Patient Has Paino Yes Site Locations Pain Location:  Pain in Ulcers With Dressing Change: Yes Duration of the Pain. Constant / Intermittento Intermittent Rate the pain. Current Pain Level: 3 Worst Pain Level: 10 Least Pain Level:  0 Character of Pain Describe the Pain: Stabbing Pain Management and Medication Current Pain Management: Medication: Yes How does your pain impact your activities of daily livingo Sleep: Yes Emotions: Yes Appetite: No Electronic Signature(s) Signed: 10/19/2015 4:58:52 PM By: Georgana Curio Entered By: Georgana Curio on 10/19/2015 15:29:33 Kleen, Jezebel R. ([id number]) -------------------------------------------------------------------------------- Patient/Caregiver Education Details Patient Name: [name] R. Date of Service: 10/19/2015 3:00 PM Medical Record Number: [id number] Patient Account Number: [id number] Date of Birth/Gender: 1945-12-29 (69 y.o. Female) Treating RN: Georgana Curio Primary Care Physician: Rennie Plowman Other Clinician: Referring Physician: Rennie Plowman Treating Physician/Extender: Elayne Snare in Treatment: 5 Education Assessment Education Provided To: Patient and Caregiver Education Topics Provided Wound/Skin Impairment: Methods: Explain/Verbal Responses: State content correctly Electronic Signature(s) Signed: 10/19/2015 4:58:52 PM By: Georgana Curio Entered By: Georgana Curio on 10/19/2015 16:16:52 Killion, Rayvn R. ([id number]) -------------------------------------------------------------------------------- Wound Assessment Details Patient Name: [name], [name] Date of Service: 10/19/2015 3:00 PM Medical Record Number: [id number] Patient Account Number: [id number] Date of Birth/Sex: 11-12-1945 (69 y.o. Female) Treating RN: Georgana Curio Primary Care Physician: Rennie Plowman Other Clinician: Referring Physician: Rennie Plowman Treating Physician/Extender: Ardath Sax Weeks in Treatment: 5 Wound Status Wound Number: 1 Primary Hidradenitis Etiology: Wound Location: Abdomen - Lower Quadrant - Circumfernential Wound Open Status: Wounding Event: Other Lesion Comorbid Cataracts, Congestive Heart Failure, Date Acquired:  06/11/2015 History: Hypertension, Type II Diabetes, Weeks Of Treatment: 5 Osteoarthritis, Neuropathy Clustered Wound: No Photos Wound Measurements Length: (cm) 47 Width: (cm) 11 Depth: (cm) 0.1 Area: (cm) 406.051 Volume: (cm) 40.605 % Reduction in Area: 71.7% % Reduction in Volume: 71.7% Epithelialization: Large (67-100%) Tunneling: No Undermining: No Wound Description Full Thickness With Exposed Classification: Support Structures Wound Margin: Indistinct, nonvisible Exudate Large Amount: Exudate Type: Serosanguineous Exudate Color: red, brown Foul Odor After Cleansing: No Wound Bed Granulation Amount: Large (67-100%) Exposed Structure Granulation Quality: Red, Pink Fascia Exposed: No Necrotic Amount: Small (1-33%) Fat Layer Exposed: No Sandra, Ameliah R. ([id number]) Necrotic Quality: Adherent Slough Tendon Exposed: No Muscle Exposed: No Joint Exposed: No Bone Exposed: No Limited to Skin Breakdown Periwound Skin Texture Texture Color No Abnormalities Noted: No No Abnormalities Noted: No Localized Edema: Yes Erythema: Yes Erythema Location: Circumferential Moisture No Abnormalities Noted: No Temperature / Pain Moist: Yes Temperature: No Abnormality Tenderness on Palpation: Yes Wound Preparation Ulcer Cleansing: Rinsed/Irrigated with Saline Topical Anesthetic Applied: None, Other: lidocaine 4%, Treatment Notes Wound #1 (Circumferential Abdomen - Lower Quadrant) 1. Cleansed with: Clean wound with Normal Saline 2. Anesthetic Liquid 4% Topical Lidocaine Solution 4. Dressing Applied: Aquacel Ag 5. Secondary Dressing Applied ABD Pad 7. Secured with Secretary/administrator) Signed: 10/19/2015 4:57:50 PM By: Curtis Sites Signed: 10/19/2015 4:58:52 PM By: Georgana Curio Entered By: Curtis Sites on 10/19/2015 16:38:40 Kepner, Marciana R. ([id number]) -------------------------------------------------------------------------------- Wound Assessment  Details Patient Name: Sivertsen, Jeffifer R. Date of Service: 10/19/2015 3:00 PM Medical Record Number: [id number] Patient Account Number: [id number] Date of Birth/Sex: 1945/06/28 (69 y.o. Female) Treating RN: Georgana Curio Primary Care Physician: Rennie Plowman Other Clinician: Referring Physician: Rennie Plowman Treating Physician/Extender: Ardath Sax Weeks in Treatment: 5 Wound Status Wound Number: 2 Primary Hidradenitis Etiology: Wound Location: Pubis Wound Open Wounding Event: Other Lesion Status: Date Acquired: 06/11/2015 Comorbid Cataracts, Congestive Heart Failure, Weeks Of Treatment: 5 History: Hypertension, Type II Diabetes, Clustered Wound: No Osteoarthritis, Neuropathy Photos Photo Uploaded By:  Curtis Sites on 10/19/2015 16:38:08 Wound Measurements Length: (cm) 9 Width: (cm) 5 Depth: (cm) 0.1 Area: (cm) 35.343 Volume: (cm) 3.534 % Reduction in Area: 10% % Reduction in Volume: 55% Epithelialization: Medium (34-66%) Tunneling: No Undermining: No Wound Description Full Thickness With Exposed Classification: Support Structures Wound Margin: Indistinct, nonvisible Exudate Large Amount: Exudate Type: Serous Exudate Color: amber Foul Odor After Cleansing: No Wound Bed Granulation Amount: Large (67-100%) Exposed Structure Granulation Quality: Red, Pink Fascia Exposed: No Scalise, Lindsi R. ([id number]) Necrotic Amount: Small (1-33%) Fat Layer Exposed: No Necrotic Quality: Adherent Slough Tendon Exposed: No Muscle Exposed: No Joint Exposed: No Bone Exposed: No Limited to Skin Breakdown Periwound Skin Texture Texture Color No Abnormalities Noted: No No Abnormalities Noted: No Localized Edema: Yes Erythema: Yes Erythema Location: Circumferential Moisture No Abnormalities Noted: No Temperature / Pain Moist: Yes Temperature: No Abnormality Tenderness on Palpation: Yes Wound Preparation Ulcer Cleansing: Rinsed/Irrigated with Saline Topical  Anesthetic Applied: Other: lidocaine 4 %, Treatment Notes Wound #2 (Pubis) 1. Cleansed with: Clean wound with Normal Saline 2. Anesthetic Liquid 4% Topical Lidocaine Solution 4. Dressing Applied: Aquacel Ag 5. Secondary Dressing Applied ABD Pad 7. Secured with Secretary/administrator) Signed: 10/19/2015 4:58:52 PM By: Georgana Curio Entered By: Georgana Curio on 10/19/2015 15:42:10 Calloway, Janylah R. ([id number]) -------------------------------------------------------------------------------- Wound Assessment Details Patient Name: Brownstein, Zuma R. Date of Service: 10/19/2015 3:00 PM Medical Record Number: [id number] Patient Account Number: [id number] Date of Birth/Sex: [birth year] (69 y.o. Female) Treating RN: Georgana Curio Primary Care Physician: Rennie Plowman Other Clinician: Referring Physician: Rennie Plowman Treating Physician/Extender: Ardath Sax Weeks in Treatment: 5 Wound Status Wound Number: 3 Primary Hidradenitis Etiology: Wound Location: Groin Wound Open Wounding Event: Other Lesion Status: Date Acquired: 06/11/2015 Comorbid Cataracts, Congestive Heart Failure, Weeks Of Treatment: 5 History: Hypertension, Type II Diabetes, Clustered Wound: No Osteoarthritis, Neuropathy Photos Photo Uploaded By: Curtis Sites on 10/19/2015 16:38:08 Wound Measurements Length: (cm) 5 Width: (cm) 5.2 Depth: (cm) 0.1 Area: (cm) 20.42 Volume: (cm) 2.042 % Reduction in Area: -116.7% % Reduction in Volume: -116.8% Epithelialization: None Tunneling: No Undermining: No Wound Description Full Thickness With Exposed Classification: Support Structures Diabetic Severity Grade 1 (Wagner): Wound Margin: Indistinct, nonvisible Exudate Amount: Large Exudate Type: Serosanguineous Exudate Color: red, brown Foul Odor After Cleansing: No Wound Bed Granulation Amount: Large (67-100%) Exposed Structure Qin, Lubna R. ([id number]) Granulation Quality: Red,  Pink Fascia Exposed: No Necrotic Amount: Small (1-33%) Fat Layer Exposed: No Necrotic Quality: Adherent Slough Tendon Exposed: No Muscle Exposed: No Joint Exposed: No Bone Exposed: No Limited to Skin Breakdown Periwound Skin Texture Texture Color No Abnormalities Noted: No No Abnormalities Noted: No Localized Edema: Yes Erythema: Yes Erythema Location: Circumferential Moisture No Abnormalities Noted: No Temperature / Pain Moist: Yes Temperature: No Abnormality Tenderness on Palpation: Yes Wound Preparation Ulcer Cleansing: Rinsed/Irrigated with Saline Topical Anesthetic Applied: Other: lidocaine 4%, Treatment Notes Wound #3 (Groin) 1. Cleansed with: Clean wound with Normal Saline 2. Anesthetic Liquid 4% Topical Lidocaine Solution 4. Dressing Applied: Aquacel Ag 5. Secondary Dressing Applied ABD Pad 7. Secured with Secretary/administrator) Signed: 10/19/2015 4:58:52 PM By: Georgana Curio Entered By: Georgana Curio on 10/19/2015 15:42:42 Rasberry, Jessicalynn R. ([id number]) -------------------------------------------------------------------------------- Vitals Details Patient Name: [name] R. Date of Service: 10/19/2015 3:00 PM Medical Record Number: [id number] Patient Account Number: [id number] Date of Birth/Sex: [birth year] (69 y.o. Female) Treating RN: Georgana Curio Primary Care Physician: Rennie Plowman Other Clinician: Referring Physician: Rennie Plowman Treating Physician/Extender: Ardath Sax Weeks in Treatment: 5 Vital Signs Time Taken: 15:29  Temperature (F): 97.9 Height (in): 62 Pulse (bpm): 88 Weight (lbs): 251 Respiratory Rate (breaths/min): 20 Body Mass Index (BMI): 45.9 Blood Pressure (mmHg): 156/60 Capillary Blood Glucose (mg/dl): 509 Reference Range: 80 - 120 mg / dl Electronic Signature(s) Signed: 10/19/2015 4:58:52 PM By: Georgana Curio Entered By: Georgana Curio on 10/19/2015 15:30:45

## 2015-10-24 NOTE — Progress Notes (Signed)
NOVIA, KANIA ([id number]) Visit Report for 10/19/2015 Chief Complaint Document Details Patient Name: [name], [name]. Date of Service: 10/19/2015 3:00 PM Medical Record Number: [id number] Patient Account Number: [id number] Date of Birth/Sex: [birth year] (70 y.o. Female) Treating RN: Georgana Curio Primary Care Physician: Rennie Plowman Other Clinician: Referring Physician: Rennie Plowman Treating Physician/Extender: Elayne Snare in Treatment: 5 Information Obtained from: Patient Chief Complaint Patients presents for treatment of an open diabetic ulcer the region of both groins for about 3 months Electronic Signature(s) Signed: 10/19/2015 4:05:00 PM By: Evlyn Kanner MD, FACS Signed: 10/23/2015 3:48:47 PM By: Ardath Sax MD Entered By: Evlyn Kanner on 10/19/2015 16:04:59 Chiara, Sunaina R. ([id number]) -------------------------------------------------------------------------------- HPI Details Patient Name: ANAYAT, NOLT R. Date of Service: 10/19/2015 3:00 PM Medical Record Number: [id number] Patient Account Number: [id number] Date of Birth/Sex: [birth year] (70 y.o. Female) Treating RN: Georgana Curio Primary Care Physician: Rennie Plowman Other Clinician: Referring Physician: Rennie Plowman Treating Physician/Extender: Elayne Snare in Treatment: 5 History of Present Illness Location: bilateral groin and lower abdomen and pannus ulcerations Quality: Patient reports experiencing a sharp pain to affected area(s). Severity: Patient states wound are getting worse. Duration: Patient has had the wound for > 3 months prior to seeking treatment at the wound center Timing: Pain in wound is constant (hurts all the time) Context: The wound appeared gradually over time Modifying Factors: Other treatment(s) tried include:he has seen a dermatologist who has prescribed antifungal, antibiotics and local care Associated Signs and Symptoms: Patient reports having difficulty  standing for long periods. HPI Description: 70 year old patient's who is morbidly obese and has several problems has her usual which pannus which is covering most of her lower abdomen and groin. She has multiple ulceration which are tender and this is in the region of both groins. Has been having appropriate treatment from her dermatologist. She has been referred to as from the [facility] dermatology service by Dr. Armida Sans. Was seen there for lesions on the groin draining significant amount of fluid and has been on Diflucan and Alcortin A. Her past medical history is significant for asthma, diabetes, eczema, psoriasis, status post hysterectomy and status post ankle surgery. She was noted to have dermatitis located to her trunk with crusted papules and ulcer located in her groin, the largest being on the right side approximately 1 cm. The patient also has intertrigo located to her groin bilaterally. His assessment was that of neurodermatitis, ulcer with Pseudomonas collaterization and for this he had recommended ciprofloxacin for 7 days. He had also started her on Diflucan for 3 weeks. 10/19/2015 -- a stack of notes were received from her previous physicians in [facility]. Medical history of diabetes mellitus type 2, morbid obesity, hyperlipidemia, hypertension, history of CEA in March 2016, hypothyroidism, fibroadenoma of the breast, right carotid disease, status post hysterectomy in 2011, fracture left ankle with multiple surgeries in 2004. Hg A1c was 7% in June 2017. In the past the patient has been treated for scabies in October 2015. Selina Cooley of notes went back to June 2015 and a lot of them were unconnected. Electronic Signature(s) Signed: 10/19/2015 4:05:08 PM By: Evlyn Kanner MD, FACS Signed: 10/23/2015 3:48:47 PM By: Ardath Sax MD Previous Signature: 10/19/2015 3:39:21 PM Version By: Evlyn Kanner MD, FACS Previous Signature: 10/19/2015 3:37:10 PM Version By:  Evlyn Kanner MD, FACS Entered By: Evlyn Kanner on 10/19/2015 16:05:08 Pitter, Beth RMarland Kitchen ([id number]) -------------------------------------------------------------------------------- Physical Exam Details Patient Name: Pledger, Arielys R. Date of Service: 10/19/2015 3:00 PM Medical Record Number:  [id number] Patient Account Number: [id number] Date of Birth/Sex: 1946/02/21 (70 y.o. Female) Treating RN: Georgana Curio Primary Care Physician: Rennie Plowman Other Clinician: Referring Physician: Rennie Plowman Treating Physician/Extender: Ardath Sax Weeks in Treatment: 5 Constitutional . Pulse regular. Respirations normal and unlabored. Afebrile. . Eyes Nonicteric. Reactive to light. Ears, Nose, Mouth, and Throat Lips, teeth, and gums WNL.Marland Kitchen Moist mucosa without lesions. Neck supple and nontender. No palpable supraclavicular or cervical adenopathy. Normal sized without goiter. Respiratory WNL. No retractions.. Breath sounds WNL, No rubs, rales, rhonchi, or wheeze.. Cardiovascular Heart rhythm and rate regular, no murmur or gallop.. Pedal Pulses WNL. No clubbing, cyanosis or edema. Lymphatic No adneopathy. No adenopathy. No adenopathy. Musculoskeletal Adexa without tenderness or enlargement.. Digits and nails w/o clubbing, cyanosis, infection, petechiae, ischemia, or inflammatory conditions.. Integumentary (Hair, Skin) No suspicious lesions. No crepitus or fluctuance. No peri-wound warmth or erythema. No masses.Marland Kitchen Psychiatric Judgement and insight Intact.. No evidence of depression, anxiety, or agitation.. Notes wounds are much cleaner today and there is no active inflammation or fungal infection. she has a large pannus which is covering most of her groins and upper thigh and due to this she has multiple areas of ulcerations on the lower part of her pannus and her groins and these are extremely tender. They have healthy granulation tissue and no surrounding cellulitis at the present  time. Electronic Signature(s) Signed: 10/19/2015 4:05:59 PM By: Evlyn Kanner MD, FACS Signed: 10/23/2015 3:48:47 PM By: Ardath Sax MD Entered By: Evlyn Kanner on 10/19/2015 16:05:57 Azbell, Scherrie Gerlach ([id number]) -------------------------------------------------------------------------------- Physician Orders Details Patient Name: GRAVIELA, NODAL R. Date of Service: 10/19/2015 3:00 PM Medical Record Number: [id number] Patient Account Number: [id number] Date of Birth/Sex: [birth year] (70 y.o. Female) Treating RN: Georgana Curio Primary Care Physician: Rennie Plowman Other Clinician: Referring Physician: Rennie Plowman Treating Physician/Extender: Elayne Snare in Treatment: 5 Verbal / Phone Orders: No Diagnosis Coding Wound Cleansing Wound #1 Circumferential Abdomen - Lower Quadrant o Clean wound with Normal Saline. Wound #2 Pubis o Clean wound with Normal Saline. Wound #3 Groin o Clean wound with Normal Saline. Primary Wound Dressing Wound #1 Circumferential Abdomen - Lower Quadrant o Aquacel Ag Wound #2 Pubis o Aquacel Ag Wound #3 Groin o Aquacel Ag Secondary Dressing Wound #1 Circumferential Abdomen - Lower Quadrant o ABD pad Wound #2 Pubis o ABD pad Wound #3 Groin o ABD pad Dressing Change Frequency Wound #1 Circumferential Abdomen - Lower Quadrant o Change dressing every other day. Wound #2 Pubis o Change dressing every other day. Allsbrook, Catia R. ([id number]) Wound #3 Groin o Change dressing every other day. Follow-up Appointments Wound #1 Circumferential Abdomen - Lower Quadrant o Return Appointment in 1 week. - on Monday Wound #2 Pubis o Return Appointment in 1 week. - on Monday Wound #3 Groin o Return Appointment in 1 week. - on Monday Off-Loading o Turn and reposition every 2 hours Additional Orders / Instructions Wound #1 Circumferential Abdomen - Lower Quadrant o Increase protein intake. o Activity as  tolerated Wound #2 Pubis o Increase protein intake. o Activity as tolerated Wound #3 Groin o Increase protein intake. o Activity as tolerated Electronic Signature(s) Signed: 10/19/2015 4:58:52 PM By: Georgana Curio Signed: 10/23/2015 3:48:47 PM By: Ardath Sax MD Entered By: Georgana Curio on 10/19/2015 15:59:55 Pinard, Veronika R. ([id number]) -------------------------------------------------------------------------------- Problem List Details Patient Name: OLIVE, ZMUDA R. Date of Service: 10/19/2015 3:00 PM Medical Record Number: [id number] Patient Account Number: [id number] Date of Birth/Sex: [birth year] (70 y.o. Female) Treating RN: Georgana Curio Primary Care Physician: ARNETT,  MARGARET Other Clinician: Referring Physician: ARNETT, MARGARET Treating Physician/Extender: Elayne Snare in Treatment: 5 Active Problems ICD-10 Encounter Code Description Active Date Diagnosis E11.622 Type 2 diabetes mellitus with other skin ulcer 09/14/2015 Yes B36.9 Superficial mycosis, unspecified 09/14/2015 Yes E66.01 Morbid (severe) obesity due to excess calories 09/14/2015 Yes L30.4 Erythema intertrigo 09/14/2015 Yes L03.311 Cellulitis of abdominal wall 09/14/2015 Yes S31.105S Unspecified open wound of abdominal wall, periumbilic 09/14/2015 Yes region without penetration into peritoneal cavity, sequela Inactive Problems Resolved Problems Electronic Signature(s) Signed: 10/19/2015 4:04:51 PM By: Evlyn Kanner MD, FACS Signed: 10/23/2015 3:48:47 PM By: Ardath Sax MD Entered By: Evlyn Kanner on 10/19/2015 16:04:51 Mccree, Lubna R. ([id number]) -------------------------------------------------------------------------------- Progress Note Details Patient Name: Cara, Icelyn R. Date of Service: 10/19/2015 3:00 PM Medical Record Number: [id number] Patient Account Number: [id number] Date of Birth/Sex: Sep 12, 1945 (70 y.o. Female) Treating RN: Georgana Curio Primary Care Physician: Rennie Plowman Other Clinician: Referring Physician: Rennie Plowman Treating Physician/Extender: Elayne Snare in Treatment: 5 Subjective Chief Complaint Information obtained from Patient Patients presents for treatment of an open diabetic ulcer the region of both groins for about 3 months History of Present Illness (HPI) The following HPI elements were documented for the patient's wound: Location: bilateral groin and lower abdomen and pannus ulcerations Quality: Patient reports experiencing a sharp pain to affected area(s). Severity: Patient states wound are getting worse. Duration: Patient has had the wound for > 3 months prior to seeking treatment at the wound center Timing: Pain in wound is constant (hurts all the time) Context: The wound appeared gradually over time Modifying Factors: Other treatment(s) tried include:he has seen a dermatologist who has prescribed antifungal, antibiotics and local care Associated Signs and Symptoms: Patient reports having difficulty standing for long periods. 70 year old patient's who is morbidly obese and has several problems has her usual which pannus which is covering most of her lower abdomen and groin. She has multiple ulceration which are tender and this is in the region of both groins. Has been having appropriate treatment from her dermatologist. She has been referred to as from the [facility] dermatology service by Dr. Armida Sans. Was seen there for lesions on the groin draining significant amount of fluid and has been on Diflucan and Alcortin A. Her past medical history is significant for asthma, diabetes, eczema, psoriasis, status post hysterectomy and status post ankle surgery. She was noted to have dermatitis located to her trunk with crusted papules and ulcer located in her groin, the largest being on the right side approximately 1 cm. The patient also has intertrigo located to her groin bilaterally. His assessment was that of  neurodermatitis, ulcer with Pseudomonas collaterization and for this he had recommended ciprofloxacin for 7 days. He had also started her on Diflucan for 3 weeks. 10/19/2015 -- a stack of notes were received from her previous physicians in [facility]. Medical history of diabetes mellitus type 2, morbid obesity, hyperlipidemia, hypertension, history of CEA in March 2016, hypothyroidism, fibroadenoma of the breast, right carotid disease, status post hysterectomy in 2011, fracture left ankle with multiple surgeries in 2004. Hg A1c was 7% in June 2017. In the past the patient has been treated for scabies in October 2015. Selina Cooley of notes went back to June 2015 and a lot of them were unconnected. Stites, Mirka R. ([id number]) Objective Constitutional Pulse regular. Respirations normal and unlabored. Afebrile. Vitals Time Taken: 3:29 PM, Height: 62 in, Weight: 251 lbs, BMI: 45.9, Temperature: 97.9 F, Pulse: 88 bpm, Respiratory Rate: 20 breaths/min,  Blood Pressure: 156/60 mmHg, Capillary Blood Glucose: 188 mg/dl. Eyes Nonicteric. Reactive to light. Ears, Nose, Mouth, and Throat Lips, teeth, and gums WNL.Marland Kitchen Moist mucosa without lesions. Neck supple and nontender. No palpable supraclavicular or cervical adenopathy. Normal sized without goiter. Respiratory WNL. No retractions.. Breath sounds WNL, No rubs, rales, rhonchi, or wheeze.. Cardiovascular Heart rhythm and rate regular, no murmur or gallop.. Pedal Pulses WNL. No clubbing, cyanosis or edema. Lymphatic No adneopathy. No adenopathy. No adenopathy. Musculoskeletal Adexa without tenderness or enlargement.. Digits and nails w/o clubbing, cyanosis, infection, petechiae, ischemia, or inflammatory conditions.Marland Kitchen Psychiatric Judgement and insight Intact.. No evidence of depression, anxiety, or agitation.. General Notes: wounds are much cleaner today and there is no active inflammation or fungal infection. she has a large pannus  which is covering most of her groins and upper thigh and due to this she has multiple areas of ulcerations on the lower part of her pannus and her groins and these are extremely tender. They have healthy granulation tissue and no surrounding cellulitis at the present time. Integumentary (Hair, Skin) No suspicious lesions. No crepitus or fluctuance. No peri-wound warmth or erythema. No masses.. Wound #1 status is Open. Original cause of wound was Other Lesion. The wound is located on the Circumferential Abdomen - Lower Quadrant. The wound measures 47cm length x 11cm width x 0.1cm depth; 406.051cm^2 area and 40.605cm^3 volume. The wound is limited to skin breakdown. There is no Swallows, Luwanda R. ([id number]) tunneling or undermining noted. There is a large amount of serosanguineous drainage noted. The wound margin is indistinct and nonvisible. There is large (67-100%) red, pink granulation within the wound bed. There is a small (1-33%) amount of necrotic tissue within the wound bed including Adherent Slough. The periwound skin appearance exhibited: Localized Edema, Moist, Erythema. The surrounding wound skin color is noted with erythema which is circumferential. Periwound temperature was noted as No Abnormality. The periwound has tenderness on palpation. Wound #2 status is Open. Original cause of wound was Other Lesion. The wound is located on the Pubis. The wound measures 9cm length x 5cm width x 0.1cm depth; 35.343cm^2 area and 3.534cm^3 volume. The wound is limited to skin breakdown. There is no tunneling or undermining noted. There is a large amount of serous drainage noted. The wound margin is indistinct and nonvisible. There is large (67-100%) red, pink granulation within the wound bed. There is a small (1-33%) amount of necrotic tissue within the wound bed including Adherent Slough. The periwound skin appearance exhibited: Localized Edema, Moist, Erythema. The surrounding wound skin color is  noted with erythema which is circumferential. Periwound temperature was noted as No Abnormality. The periwound has tenderness on palpation. Wound #3 status is Open. Original cause of wound was Other Lesion. The wound is located on the Groin. The wound measures 5cm length x 5.2cm width x 0.1cm depth; 20.42cm^2 area and 2.042cm^3 volume. The wound is limited to skin breakdown. There is no tunneling or undermining noted. There is a large amount of serosanguineous drainage noted. The wound margin is indistinct and nonvisible. There is large (67-100%) red, pink granulation within the wound bed. There is a small (1-33%) amount of necrotic tissue within the wound bed including Adherent Slough. The periwound skin appearance exhibited: Localized Edema, Moist, Erythema. The surrounding wound skin color is noted with erythema which is circumferential. Periwound temperature was noted as No Abnormality. The periwound has tenderness on palpation. Assessment Active Problems ICD-10 E11.622 - Type 2 diabetes mellitus with other skin ulcer B36.9 -  Superficial mycosis, unspecified E66.01 - Morbid (severe) obesity due to excess calories L30.4 - Erythema intertrigo L03.311 - Cellulitis of abdominal wall S31.105S - Unspecified open wound of abdominal wall, periumbilic region without penetration into peritoneal cavity, sequela Plan Wound Cleansing: Wound #1 Circumferential Abdomen - Lower Quadrant: Brotz, Deamber R. ([id number]) Clean wound with Normal Saline. Wound #2 Pubis: Clean wound with Normal Saline. Wound #3 Groin: Clean wound with Normal Saline. Primary Wound Dressing: Wound #1 Circumferential Abdomen - Lower Quadrant: Aquacel Ag Wound #2 Pubis: Aquacel Ag Wound #3 Groin: Aquacel Ag Secondary Dressing: Wound #1 Circumferential Abdomen - Lower Quadrant: ABD pad Wound #2 Pubis: ABD pad Wound #3 Groin: ABD pad Dressing Change Frequency: Wound #1 Circumferential Abdomen - Lower  Quadrant: Change dressing every other day. Wound #2 Pubis: Change dressing every other day. Wound #3 Groin: Change dressing every other day. Follow-up Appointments: Wound #1 Circumferential Abdomen - Lower Quadrant: Return Appointment in 1 week. - on Monday Wound #2 Pubis: Return Appointment in 1 week. - on Monday Wound #3 Groin: Return Appointment in 1 week. - on Monday Off-Loading: Turn and reposition every 2 hours Additional Orders / Instructions: Wound #1 Circumferential Abdomen - Lower Quadrant: Increase protein intake. Activity as tolerated Wound #2 Pubis: Increase protein intake. Activity as tolerated Wound #3 Groin: Increase protein intake. Activity as tolerated AYRIANA, WIX ([id number]) This 70 year old patient who has her chronic problem with a dermatological condition which is being treated appropriately by her dermatologist. We have not seen her back for about a month and her dermatologist said that there is nothing more he can do. I have recommended offloading as much as possible by changing positions from time to time keeping the area dry with constant changing of her undergarments and then using silver alginate pads to help treat the wounds locally. He is encouraged to continue good control of her diabetes mellitus and see her dermatologist on a regular intervals. She has also been encouraged to see a tertiary care hospital for an opinion regarding her complex problem which will require a multidisciplinary team approach. All their questions have been answered and they will see [place] back next week. Electronic Signature(s) Signed: 10/19/2015 4:08:24 PM By: Evlyn Kanner MD, FACS Signed: 10/23/2015 3:48:47 PM By: Ardath Sax MD Entered By: Evlyn Kanner on 10/19/2015 16:08:24 Beecham, Yazleemar Elvera Lennox ([id number]) -------------------------------------------------------------------------------- SuperBill Details Patient Name: LELA, MURFIN R. Date of Service:  10/19/2015 Medical Record Number: [id number] Patient Account Number: [id number] Date of Birth/Sex: 10-Aug-1945 (70 y.o. Female) Treating RN: Georgana Curio Primary Care Physician: Rennie Plowman Other Clinician: Referring Physician: Rennie Plowman Treating Physician/Extender: Elayne Snare in Treatment: 5 Diagnosis Coding ICD-10 Codes Code Description E11.622 Type 2 diabetes mellitus with other skin ulcer B36.9 Superficial mycosis, unspecified E66.01 Morbid (severe) obesity due to excess calories L30.4 Erythema intertrigo L03.311 Cellulitis of abdominal wall Unspecified open wound of abdominal wall, periumbilic region without penetration into S31.105S peritoneal cavity, sequela Facility Procedures CPT4 Code: [id number] Description: 32440 - WOUND CARE VISIT-LEV 5 EST PT Modifier: Quantity: 1 Physician Procedures CPT4: Description Modifier Quantity Code [id number] - WC PHYS LEVEL 3 - EST PT 1 ICD-10 Description Diagnosis E11.622 Type 2 diabetes mellitus with other skin ulcer B36.9 Superficial mycosis, unspecified L03.311 Cellulitis of abdominal wall S31.105S  Unspecified open wound of abdominal wall, periumbilic region without penetration into peritoneal cavity, sequela Electronic Signature(s) Signed: 10/19/2015 4:58:52 PM By: Georgana Curio Signed: 10/23/2015 3:48:47 PM By: Ardath Sax MD Previous Signature: 10/19/2015 4:08:41 PM Version By: Evlyn Kanner MD,  FACS Entered By: Georgana CurioAustin, Betsy on 10/19/2015 16:14:40

## 2015-10-27 ENCOUNTER — Telehealth: Payer: Self-pay | Admitting: *Deleted

## 2015-10-27 NOTE — Telephone Encounter (Signed)
Crystal from Dr. Ulice Bold office -plastic surgery received a referral for patient, however was not sure why.

## 2015-10-27 NOTE — Telephone Encounter (Signed)
Spoke with Crystal at the office.    This referral is not at the right office, can you assist  Thanks

## 2015-10-27 NOTE — Telephone Encounter (Signed)
Can you clarify if this is correct? thanks

## 2015-10-27 NOTE — Telephone Encounter (Signed)
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

## 2015-10-28 NOTE — Telephone Encounter (Signed)
I had called [facility] for her bariatric referral for possible pannus removal. They said that they do not surgery for pannus removal that she needed to go to plastics.  I'll have to call the patient to see where she went for her bariatric surgery so I can call them to see if they can do it, other wise I will keep researching.

## 2015-10-28 NOTE — Telephone Encounter (Signed)
Thanks [employer].  To makes this simple-lets get her a plain bariatric consult. She is morbidly obese and bottom line is it is causing medical problems. Disregard my comment about her pannus as this seems to be sticking point.

## 2015-10-29 ENCOUNTER — Emergency Department
Admission: EM | Admit: 2015-10-29 | Discharge: 2015-10-29 | Disposition: A | Discharge status: Home / Self Care | Payer: Medicare Other | Attending: Emergency Medicine | Admitting: Emergency Medicine

## 2015-10-29 ENCOUNTER — Encounter: Payer: Self-pay | Admitting: *Deleted

## 2015-10-29 ENCOUNTER — Ambulatory Visit (INDEPENDENT_AMBULATORY_CARE_PROVIDER_SITE_OTHER): Payer: Medicare Other | Admitting: Family

## 2015-10-29 ENCOUNTER — Emergency Department: Payer: Medicare Other

## 2015-10-29 ENCOUNTER — Encounter: Payer: Self-pay | Admitting: Family

## 2015-10-29 ENCOUNTER — Ambulatory Visit: Payer: Medicare Other

## 2015-10-29 ENCOUNTER — Telehealth: Payer: Self-pay | Admitting: Family

## 2015-10-29 ENCOUNTER — Ambulatory Visit (INDEPENDENT_AMBULATORY_CARE_PROVIDER_SITE_OTHER): Payer: Medicare Other

## 2015-10-29 VITALS — BP 138/62 | HR 56 | Temp 98.2°F | Wt 256.6 lb

## 2015-10-29 DIAGNOSIS — Z794 Long term (current) use of insulin: Secondary | ICD-10-CM | POA: Insufficient documentation

## 2015-10-29 DIAGNOSIS — W19XXXA Unspecified fall, initial encounter: Secondary | ICD-10-CM

## 2015-10-29 DIAGNOSIS — R21 Rash and other nonspecific skin eruption: Secondary | ICD-10-CM | POA: Diagnosis not present

## 2015-10-29 DIAGNOSIS — I110 Hypertensive heart disease with heart failure: Secondary | ICD-10-CM | POA: Diagnosis not present

## 2015-10-29 DIAGNOSIS — R0602 Shortness of breath: Secondary | ICD-10-CM

## 2015-10-29 DIAGNOSIS — W0110XA Fall on same level from slipping, tripping and stumbling with subsequent striking against unspecified object, initial encounter: Secondary | ICD-10-CM | POA: Diagnosis not present

## 2015-10-29 DIAGNOSIS — S0990XA Unspecified injury of head, initial encounter: Secondary | ICD-10-CM | POA: Insufficient documentation

## 2015-10-29 DIAGNOSIS — I509 Heart failure, unspecified: Secondary | ICD-10-CM | POA: Insufficient documentation

## 2015-10-29 DIAGNOSIS — Z7984 Long term (current) use of oral hypoglycemic drugs: Secondary | ICD-10-CM | POA: Diagnosis not present

## 2015-10-29 DIAGNOSIS — Z79899 Other long term (current) drug therapy: Secondary | ICD-10-CM | POA: Insufficient documentation

## 2015-10-29 DIAGNOSIS — S0083XA Contusion of other part of head, initial encounter: Secondary | ICD-10-CM | POA: Insufficient documentation

## 2015-10-29 DIAGNOSIS — Z791 Long term (current) use of non-steroidal anti-inflammatories (NSAID): Secondary | ICD-10-CM | POA: Insufficient documentation

## 2015-10-29 DIAGNOSIS — Y999 Unspecified external cause status: Secondary | ICD-10-CM | POA: Insufficient documentation

## 2015-10-29 DIAGNOSIS — Y939 Activity, unspecified: Secondary | ICD-10-CM | POA: Diagnosis not present

## 2015-10-29 DIAGNOSIS — E119 Type 2 diabetes mellitus without complications: Secondary | ICD-10-CM | POA: Insufficient documentation

## 2015-10-29 DIAGNOSIS — Z7982 Long term (current) use of aspirin: Secondary | ICD-10-CM | POA: Insufficient documentation

## 2015-10-29 DIAGNOSIS — E1165 Type 2 diabetes mellitus with hyperglycemia: Secondary | ICD-10-CM

## 2015-10-29 DIAGNOSIS — J45909 Unspecified asthma, uncomplicated: Secondary | ICD-10-CM | POA: Insufficient documentation

## 2015-10-29 DIAGNOSIS — Y9289 Other specified places as the place of occurrence of the external cause: Secondary | ICD-10-CM | POA: Insufficient documentation

## 2015-10-29 LAB — CBC
HCT: 27.7 % — ABNORMAL LOW (ref 35.0–47.0)
Hemoglobin: 8.9 g/dL — ABNORMAL LOW (ref 12.0–16.0)
MCH: 23.9 pg — ABNORMAL LOW (ref 26.0–34.0)
MCHC: 32.2 g/dL (ref 32.0–36.0)
MCV: 74.1 fL — AB (ref 80.0–100.0)
PLATELETS: 231 10*3/uL (ref 150–440)
RBC: 3.73 MIL/uL — ABNORMAL LOW (ref 3.80–5.20)
RDW: 19.3 % — AB (ref 11.5–14.5)
WBC: 9.2 10*3/uL (ref 3.6–11.0)

## 2015-10-29 LAB — BASIC METABOLIC PANEL
Anion gap: 7 (ref 5–15)
BUN: 17 mg/dL (ref 6–20)
CALCIUM: 8.6 mg/dL — AB (ref 8.9–10.3)
CHLORIDE: 92 mmol/L — AB (ref 101–111)
CO2: 36 mmol/L — ABNORMAL HIGH (ref 22–32)
CREATININE: 0.94 mg/dL (ref 0.44–1.00)
GFR calc non Af Amer: >60 mL/min (ref >60)
Glucose, Bld: 296 mg/dL — ABNORMAL HIGH (ref 65–99)
Potassium: 4 mmol/L (ref 3.5–5.1)
SODIUM: 135 mmol/L (ref 135–145)

## 2015-10-29 LAB — TROPONIN I

## 2015-10-29 MED ORDER — HYDROCODONE-ACETAMINOPHEN 5-325 MG PO TABS: 1.0000 | ORAL_TABLET | ORAL | 0 refills | Status: DC | PRN

## 2015-10-29 MED ORDER — MUPIROCIN CALCIUM 2 % EX CREA: 1.0000 "application " | TOPICAL_CREAM | Freq: Three times a day (TID) | CUTANEOUS | 1 refills | Status: DC

## 2015-10-29 MED ORDER — HYDROCODONE-ACETAMINOPHEN 5-325 MG PO TABS
2.0000 | ORAL_TABLET | Freq: Once | ORAL | Status: AC
  Administered 2015-10-29: 2 via ORAL
  Filled 2015-10-29: qty 2

## 2015-10-29 NOTE — Assessment & Plan Note (Addendum)
Poorly controlled. Rechecking today. Spent a great deal of time educating patient and caregiver about the importance of keeping blood sugar at the very least less than 250. When will then aim to be less than 200. I encouraged her to get back on her sliding-scale insulin at meals , to keep a blood sugar log, and food diary. She will follow up with me in one month to see if blood sugars are under better control. We discussed at great length that her rash is in large part related to her uncontrolled diabetes. Patient very much understood this. Unable to urinate in clinic today and will have this done at hospital where patient is having her labs and stat CT Head.

## 2015-10-29 NOTE — Assessment & Plan Note (Addendum)
Follows with cardiology. On lasix. Patient is sleeping in recliner in complaining of shortness of breath with activity. Pending chest x-ray and pro BNP with concern the patient may be having heart failure flare.

## 2015-10-29 NOTE — Telephone Encounter (Signed)
When you call ms Loretto tomorrow- would you ask if anyone mentioned her dropping hemoglobin??????  This was seen at [facility] ED and again at [facility] with no mention. Is she feeling dizzy , light headed?  She told me today she was feeling SOB which is of great concern- this could be from anemia.   Any blood from rectum or other sites??  She needs to be seen for f/u next week.   If she has any more SOB, please advise ED for possible blood products .Marland KitchenMarland KitchenMarland Kitchen

## 2015-10-29 NOTE — Assessment & Plan Note (Addendum)
After office visit, patient was going to have her x-ray done in our clinic and she fell when she tried to turn with the brake on with her walker. An account of the fall from [place], [place], it appears it was a mechanical fall in which she hit the corner of her walker with her head. She quickly had a note on right brow. No laceration.Patient denies loss of consciousness, headache, changes in vision. She is not on anticoagulation. Blood sugar at the time was 300. NOTE: Approximately 20 minutes later, patient was in our waiting room and was about to head for CT head today when she started to complain of dull headache. we jointly decided with patient, caregiver that it was  best that she go to emergency room for the quickest , safest evaluation.

## 2015-10-29 NOTE — Assessment & Plan Note (Signed)
I'm very pleased to see the rash is improving. I refilled patient's Bactroban to be sure we cover for any infection. Patient is afebrile. Caregiver is doing a great job with dressing changes. She sees dermatology in a couple of weeks and advise her to continue close follow-up with dermatology.

## 2015-10-29 NOTE — Progress Notes (Signed)
Pre visit review using our clinic review tool, if applicable. No additional management support is needed unless otherwise documented below in the visit note. 

## 2015-10-29 NOTE — Patient Instructions (Addendum)
Pleasure seeing you today.   Follow up in one month for DM. Keep log of blood sugars, goal for ALL to be less than 250.   Follow up for physical in 2 months.   Basic Carbohydrate Counting for Diabetes Mellitus Carbohydrate counting is a method for keeping track of the amount of carbohydrates you eat. Eating carbohydrates naturally increases the level of sugar (glucose) in your blood, so it is important for you to know the amount that is okay for you to have in every meal. Carbohydrate counting helps keep the level of glucose in your blood within normal limits. The amount of carbohydrates allowed is different for every person. A dietitian can help you calculate the amount that is right for you. Once you know the amount of carbohydrates you can have, you can count the carbohydrates in the foods you want to eat. Carbohydrates are found in the following foods:  Grains, such as breads and cereals.  Dried beans and soy products.  Starchy vegetables, such as potatoes, peas, and corn.  Fruit and fruit juices.  Milk and yogurt.  Sweets and snack foods, such as cake, cookies, candy, chips, soft drinks, and fruit drinks. CARBOHYDRATE COUNTING There are two ways to count the carbohydrates in your food. You can use either of the methods or a combination of both. Reading the "Nutrition Facts" on Packaged Food The "Nutrition Facts" is an area that is included on the labels of almost all packaged food and beverages in the [place]. It includes the serving size of that food or beverage and information about the nutrients in each serving of the food, including the grams (g) of carbohydrate per serving.  Decide the number of servings of this food or beverage that you will be able to eat or drink. Multiply that number of servings by the number of grams of carbohydrate that is listed on the label for that serving. The total will be the amount of carbohydrates you will be having when you eat or drink this  food or beverage. Learning Standard Serving Sizes of Food When you eat food that is not packaged or does not include "Nutrition Facts" on the label, you need to measure the servings in order to count the amount of carbohydrates.A serving of most carbohydrate-rich foods contains about 15 g of carbohydrates. The following list includes serving sizes of carbohydrate-rich foods that provide 15 g ofcarbohydrate per serving:   1 slice of bread (1 oz) or 1 six-inch tortilla.    of a hamburger bun or English muffin.  4-6 crackers.   cup unsweetened dry cereal.    cup hot cereal.   cup rice or pasta.    cup mashed potatoes or  of a large baked potato.  1 cup fresh fruit or one small piece of fruit.    cup canned or frozen fruit or fruit juice.  1 cup milk.   cup plain fat-free yogurt or yogurt sweetened with artificial sweeteners.   cup cooked dried beans or starchy vegetable, such as peas, corn, or potatoes.  Decide the number of standard-size servings that you will eat. Multiply that number of servings by 15 (the grams of carbohydrates in that serving). For example, if you eat 2 cups of strawberries, you will have eaten 2 servings and 30 g of carbohydrates (2 servings x 15 g = 30 g). For foods such as soups and casseroles, in which more than one food is mixed in, you will need to count the carbohydrates in  each food that is included. EXAMPLE OF CARBOHYDRATE COUNTING Sample Dinner  3 oz chicken breast.   cup of brown rice.   cup of corn.  1 cup milk.   1 cup strawberries with sugar-free whipped topping.  Carbohydrate Calculation Step 1: Identify the foods that contain carbohydrates:   Rice.   Corn.   Milk.   Strawberries. Step 2:Calculate the number of servings eaten of each:   2 servings of rice.   1 serving of corn.   1 serving of milk.   1 serving of strawberries. Step 3: Multiply each of those number of servings by 15 g:   2  servings of rice x 15 g = 30 g.   1 serving of corn x 15 g = 15 g.   1 serving of milk x 15 g = 15 g.   1 serving of strawberries x 15 g = 15 g. Step 4: Add together all of the amounts to find the total grams of carbohydrates eaten: 30 g + 15 g + 15 g + 15 g = 75 g.   This information is not intended to replace advice given to you by your health care provider. Make sure you discuss any questions you have with your health care provider.   Document Released: 02/14/2005 Document Revised: 03/07/2014 Document Reviewed: 01/11/2013 Elsevier Interactive Patient Education [employer]

## 2015-10-29 NOTE — ED Triage Notes (Signed)
Pt sent to er for eval after falling today at [employer] office. Pt has hematoma to right forehead.  Pt has bruising to right elbow,knee, and right breast.  No loc.  Pt alert.  Speech clear.  Pt ambulates with a walker.

## 2015-10-29 NOTE — ED Provider Notes (Signed)
[facility] Emergency Department Provider Note  Time seen: 6:03 PM  I have reviewed the triage vital signs and the nursing notes.   HISTORY  Chief Complaint Fall    HPI [name] is a 70 y.o. female with a past medical history of diabetes, gastric reflux, hypertension, who presents the emergency department after a fall. According to the patient she was going to sit down him about her walker slipped out from in front of her causing her to fall forwards hitting her head on the ground. Denies LOC. Denies vomiting. States some soreness in her arms and legs but denies any "pain.". Patient has been ambulatory since the event, but noticed a large bruise forming to her forehead so she came to the emergency department for evaluation. Patient states she has a history of bruising easily, the only blood thinner she takes is a baby aspirin every day.Denies any weakness or numbness. States mild headache.  Past Medical History:  Diagnosis Date  . Asthma   . Diabetes mellitus without complication (HCC)   . GERD (gastroesophageal reflux disease)   . Heart murmur   . History of hiatal hernia   . Hypertension   . Panic attacks   . Peripheral vascular disease (HCC)   . Restless leg syndrome   . Shortness of breath dyspnea     Patient Active Problem List   Diagnosis Date Noted  . Head injury 10/29/2015  . GERD (gastroesophageal reflux disease) 10/17/2015  . Pure hypercholesterolemia 10/17/2015  . Type 2 diabetes mellitus (HCC) 10/05/2015  . Depression 10/05/2015  . Rash and nonspecific skin eruption 10/05/2015  . Generalized edema 03/13/2015  . CHF (congestive heart failure) (HCC) 03/13/2015  . Carotid stenosis 07/24/2014    Past Surgical History:  Procedure Laterality Date  . ABDOMINAL HYSTERECTOMY    . BREAST BIOPSY Right yrs ago   benign  . ENDARTERECTOMY Right 07/24/2014   Procedure: ENDARTERECTOMY CAROTID;  Surgeon: Annice Needy, MD;  Location: [facility] ORS;   Service: Vascular;  Laterality: Right;  . EYE SURGERY Left    cataract  . FRACTURE SURGERY Left    fractured ankle  . TONSILLECTOMY      Prior to Admission medications   Medication Sig Start Date End Date Taking? Authorizing Provider  aspirin EC 81 MG tablet Take 81 mg by mouth daily.    Historical Provider, MD  fluconazole (DIFLUCAN) 150 MG tablet Take 150 mg by mouth every Wednesday. For 3 weeks 08/19/15   Historical Provider, MD  furosemide (LASIX) 40 MG tablet Take 1 tablet (40 mg total) by mouth 2 (two) times daily. 03/15/15   Enedina Finner, MD  gabapentin (NEURONTIN) 800 MG tablet Take 800 mg by mouth 2 (two) times daily.    Historical Provider, MD  glimepiride (AMARYL) 2 MG tablet Take 2 mg by mouth 2 (two) times daily.    Historical Provider, MD  hydrochlorothiazide (HYDRODIURIL) 12.5 MG tablet Take 12.5 mg by mouth daily.    Historical Provider, MD  insulin aspart (NOVOLOG) 100 UNIT/ML injection Inject into the skin as needed for high blood sugar.    Historical Provider, MD  insulin glargine (LANTUS) 100 UNIT/ML injection Inject into the skin daily.    Historical Provider, MD  loratadine (CLARITIN) 10 MG tablet Take 10 mg by mouth daily.    Historical Provider, MD  lovastatin (MEVACOR) 20 MG tablet Take 20 mg by mouth at bedtime.    Historical Provider, MD  metFORMIN (GLUCOPHAGE-XR) 500 MG 24  hr tablet Take 1,000 mg by mouth 2 (two) times daily.    Historical Provider, MD  mupirocin cream (BACTROBAN) 2 % Apply 1 application topically 3 (three) times daily. 10/29/15   Allegra Grana, FNP  naproxen (NAPROSYN) 500 MG tablet Take 500 mg by mouth daily.    Historical Provider, MD  nystatin (MYCOSTATIN/NYSTOP) powder Apply topically 2 (two) times daily.    Historical Provider, MD  omeprazole (PRILOSEC) 40 MG capsule Take 40 mg by mouth at bedtime.     Historical Provider, MD  potassium chloride SA (K-DUR,KLOR-CON) 20 MEQ tablet Take 2 tablets (40 mEq total) by mouth daily. Patient not  taking: Reported on 08/31/2015 03/15/15   Enedina Finner, MD  sertraline (ZOLOFT) 25 MG tablet Take 25 mg by mouth daily.    Historical Provider, MD  triamcinolone cream (KENALOG) 0.1 % Apply topically 2 (two) times daily.    Historical Provider, MD    Allergies  Allergen Reactions  . Codeine Other (See Comments)    Reaction:  Dizziness   . Wheat Bran Other (See Comments)    Reaction:  Sneezing     Family History  Problem Relation Age of Onset  . Pancreatic cancer Mother   . CAD Father   . Hypertension Father   . Pancreatic cancer Father   . CAD Brother   . Breast cancer Maternal Aunt     pt states several maternal aunts    Social History Social History  Substance Use Topics  . Smoking status: Never Smoker  . Smokeless tobacco: Never Used  . Alcohol use No    Review of Systems Constitutional: Negative for fever. Cardiovascular: Negative for chest pain. Respiratory: Negative for shortness of breath. Gastrointestinal: Negative for abdominal pain Musculoskeletal: Mild back "soreness." Neurological: Mild Headache 10-point ROS otherwise negative.  ____________________________________________   PHYSICAL EXAM:  VITAL SIGNS: ED Triage Vitals  Enc Vitals Group     BP 10/29/15 1609 (!) 136/55     Pulse Rate 10/29/15 1609 78     Resp 10/29/15 1609 18     Temp 10/29/15 1609 98.2 F (36.8 C)     Temp Source 10/29/15 1609 Oral     SpO2 10/29/15 1609 100 %     Weight 10/29/15 1610 255 lb (115.7 kg)     Height 10/29/15 1610 5\' 2"  (1.575 m)     Head Circumference --      Peak Flow --      Pain Score 10/29/15 1619 3     Pain Loc --      Pain Edu? --      Excl. in GC? --     Constitutional: Alert and oriented. Well appearing and in no distress. Eyes: Normal exam ENT   Head: Mild abrasion to forehead, mild hematoma   Mouth/Throat: Mucous membranes are moist. Cardiovascular: Normal rate, regular rhythm. No murmur Respiratory: Normal respiratory effort without  tachypnea nor retractions. Breath sounds are clear Gastrointestinal: Soft and nontender. No distention.   Musculoskeletal: Nontender with normal range of motion in all extremities. Several old bruises to her extremities. No C-spine tenderness. Neurologic:  Normal speech and language. No gross focal neurologic deficits Skin:  Skin is warm, dry and intact.  Psychiatric: Mood and affect are normal.  ____________________________________________   INITIAL IMPRESSION / ASSESSMENT AND PLAN / ED COURSE  Pertinent labs & imaging results that were available during my care of the patient were reviewed by me and considered in my medical decision making (see chart  for details).  Patient presents the emergency department after mechanical fall. Patient has been ambulatory since the fall. CT the head is negative. Labs are largely within normal limits. Patient has been a blister to the restroom without issue. We will discharge with a short course of pain medication and PCP follow-up. Patient is agreeable to plan.  ____________________________________________   FINAL CLINICAL IMPRESSION(S) / ED DIAGNOSES  Fall Hematoma    Minna Antis, MD 10/29/15 1816

## 2015-10-29 NOTE — Progress Notes (Signed)
Subjective:    Patient ID: [name], female    DOB: [birth year], 70 y.o.   MRN: [id number]  CC: [name] is a 70 y.o. female who presents today for follow up.   HPI: Patient was scheduled today for a CPE however declined physical exam ( will reschedule) and wanted to discuss chronic disease. She is accompanied by her caregiver.  Rash- Complex h/o of rash over last several months overall improving. Per chart review, patient has been seen by Wound care and has a follow-up appointment with them again in September.  8/25 she went to [facility] emergency room. Would culture  grew coagulase staph and proteus mirabils. Negative HSV . She was treated with bactroban and an acyclovir by mouth. CBC showed low counts ( will repeat today). Patient is responding well to the mupirocin ointment and would like a refill. No fever, chills. Patient pleased that rash is improving. She sees dermatology on 9/12. His appointment with [facility] 10/2015. Last them 10/19/15. Daily dressing changes by caregiver.  CHF and heart murmur- Follows with cardiology, Dr. Lucretia Roers. Sleeps in recliner. Patient reports chronic LE swelling, however not worse today. She also states that she's had no significant change in her weight.  DM- Blood sugar averages 200-300. Occasional 400.  Fasting BS in morning 120. Patient is eating candy bars at bedtime and throughout the day. She's drinking juice and snacking 'a lot'  per caregiver. Complaint with glimepiride and metformin.   Lantus 15 units in the morning and night.    Aspart- not taking currently.   SOB- last week felt SOB with activity. H/o of asthma as child. Requesting inhaler.           HISTORY:  Past Medical History:  Diagnosis Date  . Asthma   . Diabetes mellitus without complication (HCC)   . GERD (gastroesophageal reflux disease)   . Heart murmur   . History of hiatal hernia   . Hypertension   . Panic attacks   . Peripheral vascular disease (HCC)   . Restless  leg syndrome   . Shortness of breath dyspnea    Past Surgical History:  Procedure Laterality Date  . ABDOMINAL HYSTERECTOMY    . BREAST BIOPSY Right yrs ago   benign  . ENDARTERECTOMY Right 07/24/2014   Procedure: ENDARTERECTOMY CAROTID;  Surgeon: Annice Needy, MD;  Location: [facility] ORS;  Service: Vascular;  Laterality: Right;  . EYE SURGERY Left    cataract  . FRACTURE SURGERY Left    fractured ankle  . TONSILLECTOMY     Family History  Problem Relation Age of Onset  . Pancreatic cancer Mother   . CAD Father   . Hypertension Father   . Pancreatic cancer Father   . CAD Brother   . Breast cancer Maternal Aunt     pt states several maternal aunts    Allergies: Codeine and Wheat bran Current Outpatient Prescriptions on File Prior to Visit  Medication Sig Dispense Refill  . aspirin EC 81 MG tablet Take 81 mg by mouth daily.    . fluconazole (DIFLUCAN) 150 MG tablet Take 150 mg by mouth every Wednesday. For 3 weeks    . furosemide (LASIX) 40 MG tablet Take 1 tablet (40 mg total) by mouth 2 (two) times daily. 60 tablet 2  . gabapentin (NEURONTIN) 800 MG tablet Take 800 mg by mouth 2 (two) times daily.    Marland Kitchen glimepiride (AMARYL) 2 MG tablet Take 2 mg by mouth 2 (two) times  daily.    . hydrochlorothiazide (HYDRODIURIL) 12.5 MG tablet Take 12.5 mg by mouth daily.    . insulin aspart (NOVOLOG) 100 UNIT/ML injection Inject into the skin as needed for high blood sugar.    . insulin glargine (LANTUS) 100 UNIT/ML injection Inject into the skin daily.    Marland Kitchen loratadine (CLARITIN) 10 MG tablet Take 10 mg by mouth daily.    Marland Kitchen lovastatin (MEVACOR) 20 MG tablet Take 20 mg by mouth at bedtime.    . metFORMIN (GLUCOPHAGE-XR) 500 MG 24 hr tablet Take 1,000 mg by mouth 2 (two) times daily.    . naproxen (NAPROSYN) 500 MG tablet Take 500 mg by mouth daily.    Marland Kitchen nystatin (MYCOSTATIN/NYSTOP) powder Apply topically 2 (two) times daily.    Marland Kitchen omeprazole (PRILOSEC) 40 MG capsule Take 40 mg by mouth at bedtime.      . potassium chloride SA (K-DUR,KLOR-CON) 20 MEQ tablet Take 2 tablets (40 mEq total) by mouth daily. (Patient not taking: Reported on 08/31/2015) 30 tablet 0  . sertraline (ZOLOFT) 25 MG tablet Take 25 mg by mouth daily.    Marland Kitchen triamcinolone cream (KENALOG) 0.1 % Apply topically 2 (two) times daily.     No current facility-administered medications on file prior to visit.     Social History  Substance Use Topics  . Smoking status: Never Smoker  . Smokeless tobacco: Never Used  . Alcohol use No    Review of Systems  Constitutional: Negative for chills and fever.  Respiratory: Negative for cough.   Cardiovascular: Positive for leg swelling. Negative for chest pain and palpitations.  Gastrointestinal: Negative for abdominal pain, nausea and vomiting.  Endocrine: Negative for polyuria.  Genitourinary: Negative for dysuria and frequency.  Musculoskeletal: Negative for arthralgias and myalgias.  Skin: Positive for rash.  Neurological: Negative for dizziness and headaches.  Hematological: Negative for adenopathy.      Objective:    BP 138/62   Pulse (!) 56   Temp 98.2 F (36.8 C)   Wt 256 lb 9.6 oz (116.4 kg)   SpO2 95%   BMI 46.93 kg/m  BP Readings from Last 3 Encounters:  10/29/15 (!) 136/55  10/29/15 138/62  10/17/15 127/60   Wt Readings from Last 3 Encounters:  10/29/15 255 lb (115.7 kg)  10/29/15 256 lb 9.6 oz (116.4 kg)  10/17/15 258 lb (117 kg)    Physical Exam  Constitutional: She appears well-developed and well-nourished.  Eyes: Conjunctivae are normal.  Cardiovascular: Normal rate, regular rhythm and normal pulses.   Murmur heard.  Systolic murmur is present with a grade of 3/6   BLE edema around ankles. NO palpable cords or masses. No erythema or increased warmth. No asymmetry in calf size when compared bilaterally LE hair growth symmetric and present. No discoloration of varicosities noted. LE warm and palpable pedal pulses. SEM III/VI, Loudest LSB, non  radiating, no thrill   Pulmonary/Chest: Effort normal and breath sounds normal. She has no wheezes. She has no rhonchi. She has no rales.  Neurological: She is alert.  Skin: Skin is warm and dry.  Open sores under pannus. Nondraining. Largest circumference 3 cm. Covered with non-adhesive pads. Surrounding skin intact.  Psychiatric: She has a normal mood and affect. Her speech is normal and behavior is normal. Thought content normal.  Vitals reviewed.      Assessment & Plan:   Problem List Items Addressed This Visit      Cardiovascular and Mediastinum   CHF (congestive heart failure) (  HCC)    Follows with cardiology. On lasix. Patient is sleeping in recliner in complaining of shortness of breath with activity. Pending chest x-ray and pro BNP with concern the patient may be having heart failure flare.        Endocrine   Type 2 diabetes mellitus (HCC)    Poorly controlled. Rechecking today. Spent a great deal of time educating patient and caregiver about the importance of keeping blood sugar at the very least less than 250. When will then aim to be less than 200. I encouraged her to get back on her sliding-scale insulin at meals , to keep a blood sugar log, and food diary. She will follow up with me in one month to see if blood sugars are under better control. We discussed at great length that her rash is in large part related to her uncontrolled diabetes. Patient very much understood this. Unable to urinate in clinic today and will have this done at hospital where patient is having her labs and stat CT Head.       Relevant Orders   CBC with Differential/Platelet   Comprehensive metabolic panel   Hemoglobin A1c   Hepatitis C antibody   Lipid panel   TSH   VITAMIN D 25 Hydroxy (Vit-D Deficiency, Fractures)   Ambulatory referral to diabetic education   Microalbumin / creatinine urine ratio     Musculoskeletal and Integument   Rash and nonspecific skin eruption    I'm very pleased to  see the rash is improving. I refilled patient's Bactroban to be sure we cover for any infection. Patient is afebrile. Caregiver is doing a great job with dressing changes. She sees dermatology in a couple of weeks and advise her to continue close follow-up with dermatology.      Relevant Medications   mupirocin cream (BACTROBAN) 2 %     Other   Head injury - Primary    After office visit, patient was going to have her x-ray done in our clinic and she fell when she tried to turn with the brake on with her walker. An account of the fall from [place], [place], it appears it was a mechanical fall in which she hit the corner of her walker with her head. She quickly had a note on right brow. No laceration.Patient denies loss of consciousness, headache, changes in vision. She is not on anticoagulation. Blood sugar at the time was 300. NOTE: Approximately 20 minutes later, patient was in our waiting room and was about to head for CT head today when she started to complain of dull headache. we jointly decided with patient, caregiver that it was  best that she go to emergency room for the quickest , safest evaluation.       Relevant Orders   CT Head Wo Contrast    Other Visit Diagnoses    SOB (shortness of breath)       Relevant Orders   DG Chest 2 View (Completed)   B Nat Peptide       I am having Ms. Riggsbee maintain her glimepiride, hydrochlorothiazide, omeprazole, lovastatin, aspirin EC, metFORMIN, furosemide, potassium chloride SA, fluconazole, gabapentin, nystatin, sertraline, triamcinolone cream, naproxen, loratadine, insulin aspart, insulin glargine, and mupirocin cream.   Meds ordered this encounter  Medications  . mupirocin cream (BACTROBAN) 2 %    Sig: Apply 1 application topically 3 (three) times daily.    Dispense:  15 g    Refill:  1    Order Specific Question:  Supervising Provider    Answer:   Sherlene Shams [[id number]]    Return precautions given.   Risks, benefits, and  alternatives of the medications and treatment plan prescribed today were discussed, and patient expressed understanding.   Education regarding symptom management and diagnosis given to patient on AVS.  Continue to follow with Rennie Plowman, FNP for routine health maintenance.   Scherrie Gerlach Fergeson and I agreed with plan.   Rennie Plowman, FNP

## 2015-10-30 ENCOUNTER — Telehealth: Payer: Self-pay | Admitting: Family

## 2015-10-30 ENCOUNTER — Other Ambulatory Visit: Payer: Medicare Other

## 2015-10-30 DIAGNOSIS — D519 Vitamin B12 deficiency anemia, unspecified: Secondary | ICD-10-CM

## 2015-10-30 NOTE — Telephone Encounter (Incomplete)
Patient was never told

## 2015-10-30 NOTE — Telephone Encounter (Signed)
Pt caregiver called about pt medication albuterol was not called into the pharmacy. I don't medication on pt list.   Pharmacy is [facility] [address], [place] - [address]   Call pt @ [phone].

## 2015-10-30 NOTE — Telephone Encounter (Signed)
Please advise 

## 2015-10-30 NOTE — Telephone Encounter (Signed)
Patient was never told her hemoglobing was low.  Patient has episodes of SOB, feeling dizzy, and light headedness.  Scheduled patient for 5SEP2017 @ 1430.  Patients caregiver was advised about SX and going to ED for possible blood products if SX become worsening.  Patient wounds have been bleeding pretty frequently.

## 2015-10-31 ENCOUNTER — Encounter: Payer: Self-pay | Admitting: Family

## 2015-10-31 NOTE — Telephone Encounter (Signed)
Left message to check on patient with my personal phone number if she were to need anything urgent.

## 2015-11-03 ENCOUNTER — Telehealth: Payer: Self-pay

## 2015-11-03 ENCOUNTER — Ambulatory Visit (INDEPENDENT_AMBULATORY_CARE_PROVIDER_SITE_OTHER): Payer: Medicare Other | Admitting: Family

## 2015-11-03 ENCOUNTER — Encounter: Payer: Self-pay | Admitting: Family

## 2015-11-03 VITALS — BP 118/61 | HR 90 | Temp 97.8°F | Wt 256.8 lb

## 2015-11-03 DIAGNOSIS — R0602 Shortness of breath: Secondary | ICD-10-CM | POA: Insufficient documentation

## 2015-11-03 DIAGNOSIS — I5042 Chronic combined systolic (congestive) and diastolic (congestive) heart failure: Secondary | ICD-10-CM | POA: Diagnosis not present

## 2015-11-03 DIAGNOSIS — D649 Anemia, unspecified: Secondary | ICD-10-CM | POA: Diagnosis not present

## 2015-11-03 DIAGNOSIS — D519 Vitamin B12 deficiency anemia, unspecified: Secondary | ICD-10-CM | POA: Insufficient documentation

## 2015-11-03 MED ORDER — FERROUS SULFATE 325 (65 FE) MG PO TABS: 325.0000 mg | ORAL_TABLET | Freq: Every day | ORAL | 3 refills | Status: AC

## 2015-11-03 MED ORDER — MUPIROCIN 2 % EX OINT: 1.0000 "application " | TOPICAL_OINTMENT | Freq: Two times a day (BID) | CUTANEOUS | 0 refills | Status: DC

## 2015-11-03 NOTE — Assessment & Plan Note (Addendum)
Unspecified at this time. Intermittent symptoms. Patient starting iron supplement. Pending lab evaluation for anemia. No acute blood loss. No known rectal bleeding however sending patient home with hemocult cards and referring to GI for colonoscopy ( she is due). Considering referral to hematology once lab evaluation returns.

## 2015-11-03 NOTE — Progress Notes (Signed)
Pre visit review using our clinic review tool, if applicable. No additional management support is needed unless otherwise documented below in the visit note. 

## 2015-11-03 NOTE — Progress Notes (Signed)
Subjective:    Patient ID: [name], female    DOB: 01-12-1946, 70 y.o.   MRN: [id number]  CC: [name] is a 70 y.o. female who presents today for follow up.   HPI: Patient for follow-up after she fell last week in our office and hit her head. Subsequent CBC in the emergency room revealed anemia. Patient reports occasional dizziness which started prior to fall. No syncope or new falls. No HA, confusion. She also endorses easy bruising and has bruise on right forehead which is draining to nose and over to left cheek of face. She also has multiple bruises on BUE.On ASA 81 mg however doesn't take it regularly.   She also reports occasional, self limited SOB when walking long distances over several weeks. ' has to stop and rest.' No chest pain or pressure, numbness or tingling radiating to left arm or jaw, palpitations, dizziness, frequent headaches, changes in vision, during these episodes. Does not get regular exercise at this time. Remote h/o asthma per patient. No wheezing, fever, congestion. SOB not related to season or specific allergen.           HISTORY:  Past Medical History:  Diagnosis Date  . Asthma   . Diabetes mellitus without complication (HCC)   . GERD (gastroesophageal reflux disease)   . Heart murmur   . History of hiatal hernia   . Hypertension   . Panic attacks   . Peripheral vascular disease (HCC)   . Restless leg syndrome   . Shortness of breath dyspnea    Past Surgical History:  Procedure Laterality Date  . ABDOMINAL HYSTERECTOMY    . BREAST BIOPSY Right yrs ago   benign  . ENDARTERECTOMY Right 07/24/2014   Procedure: ENDARTERECTOMY CAROTID;  Surgeon: Annice Needy, MD;  Location: [facility] ORS;  Service: Vascular;  Laterality: Right;  . EYE SURGERY Left    cataract  . FRACTURE SURGERY Left    fractured ankle  . TONSILLECTOMY     Family History  Problem Relation Age of Onset  . Pancreatic cancer Mother   . CAD Father   . Hypertension Father     . Pancreatic cancer Father   . CAD Brother   . Breast cancer Maternal Aunt     pt states several maternal aunts    Allergies: Codeine and Wheat bran Current Outpatient Prescriptions on File Prior to Visit  Medication Sig Dispense Refill  . aspirin EC 81 MG tablet Take 81 mg by mouth daily.    . fluconazole (DIFLUCAN) 150 MG tablet Take 150 mg by mouth every Wednesday. For 3 weeks    . furosemide (LASIX) 40 MG tablet Take 1 tablet (40 mg total) by mouth 2 (two) times daily. 60 tablet 2  . gabapentin (NEURONTIN) 800 MG tablet Take 800 mg by mouth 2 (two) times daily.    Marland Kitchen glimepiride (AMARYL) 2 MG tablet Take 2 mg by mouth 2 (two) times daily.    . hydrochlorothiazide (HYDRODIURIL) 12.5 MG tablet Take 12.5 mg by mouth daily.    Marland Kitchen HYDROcodone-acetaminophen (NORCO/VICODIN) 5-325 MG tablet Take 1 tablet by mouth every 4 (four) hours as needed. 15 tablet 0  . insulin aspart (NOVOLOG) 100 UNIT/ML injection Inject into the skin as needed for high blood sugar.    . insulin glargine (LANTUS) 100 UNIT/ML injection Inject into the skin daily.    Marland Kitchen loratadine (CLARITIN) 10 MG tablet Take 10 mg by mouth daily.    Marland Kitchen lovastatin (MEVACOR)  20 MG tablet Take 20 mg by mouth at bedtime.    . metFORMIN (GLUCOPHAGE-XR) 500 MG 24 hr tablet Take 1,000 mg by mouth 2 (two) times daily.    . mupirocin cream (BACTROBAN) 2 % Apply 1 application topically 3 (three) times daily. 15 g 1  . naproxen (NAPROSYN) 500 MG tablet Take 500 mg by mouth daily.    Marland Kitchen nystatin (MYCOSTATIN/NYSTOP) powder Apply topically 2 (two) times daily.    Marland Kitchen omeprazole (PRILOSEC) 40 MG capsule Take 40 mg by mouth at bedtime.     . potassium chloride SA (K-DUR,KLOR-CON) 20 MEQ tablet Take 2 tablets (40 mEq total) by mouth daily. 30 tablet 0  . sertraline (ZOLOFT) 25 MG tablet Take 25 mg by mouth daily.    Marland Kitchen triamcinolone cream (KENALOG) 0.1 % Apply topically 2 (two) times daily.     No current facility-administered medications on file prior to  visit.     Social History  Substance Use Topics  . Smoking status: Never Smoker  . Smokeless tobacco: Never Used  . Alcohol use No    Review of Systems  Constitutional: Negative for chills and fever.  HENT: Negative for congestion.   Respiratory: Positive for shortness of breath. Negative for cough and wheezing.   Cardiovascular: Negative for chest pain, palpitations and leg swelling.  Gastrointestinal: Negative for blood in stool, nausea and vomiting.  Neurological: Positive for dizziness. Negative for syncope, weakness and headaches.  Hematological: Bruises/bleeds easily.      Objective:    BP 118/61   Pulse 90   Temp 97.8 F (36.6 C) (Oral)   Wt 256 lb 12.8 oz (116.5 kg)   SpO2 97%   BMI 46.97 kg/m  BP Readings from Last 3 Encounters:  11/03/15 118/61  10/29/15 (!) 145/71  10/29/15 138/62   Wt Readings from Last 3 Encounters:  11/03/15 256 lb 12.8 oz (116.5 kg)  10/29/15 255 lb (115.7 kg)  10/29/15 256 lb 9.6 oz (116.4 kg)    Physical Exam  Constitutional: She appears well-developed and well-nourished.  Eyes: Conjunctivae are normal.  Cardiovascular: Normal rate, regular rhythm, normal heart sounds and normal pulses.   Pulmonary/Chest: Effort normal and breath sounds normal. She has no wheezes. She has no rhonchi. She has no rales.  Neurological: She is alert.  Skin: Skin is warm and dry.  Multiple discrete violet colored areas over BLE. Largest on left forearm.   Psychiatric: She has a normal mood and affect. Her speech is normal and behavior is normal. Thought content normal.  Vitals reviewed.      Assessment & Plan:   Problem List Items Addressed This Visit      Cardiovascular and Mediastinum   CHF (congestive heart failure) (HCC)    No adventitious lung sounds or SOB today to suggest CHF exacerbation. Pending BNP and BMP to evaluate volume status (patient is prescribed lasix by cardiology) . CXR 8/31 showed no definite CHF which is reassuring.   Discussed with patient my concern with starting an inhaler as false sense of treatment and may worsen diastolic CHF. Advised patient that if she has any worsening of SOB, she should go to ED as my concern would be worsening anemia or CHF exacerbation.       Relevant Orders   Brain natriuretic peptide     Other   Anemia - Primary    Unspecified at this time. Intermittent symptoms. Patient starting iron supplement. Pending lab evaluation for anemia. No acute blood loss. No known  rectal bleeding however sending patient home with hemocult cards and referring to GI for colonoscopy ( she is due). Considering referral to hematology once lab evaluation returns.       Relevant Medications   ferrous sulfate 325 (65 FE) MG tablet   Other Relevant Orders   Ambulatory referral to Gastroenterology   B12 and Folate Panel   Ferritin   IBC panel   Intrinsic Factor Antibodies   Methylmalonic acid, serum   Homocysteine   CBC with Differential/Platelet   Hemoglobin A1c   Lipid panel   VITAMIN D 25 Hydroxy (Vit-D Deficiency, Fractures)   TSH   Microalbumin / creatinine urine ratio   Hepatitis C antibody   POC Hemoccult Bld/Stl (3-Cd Home Screen)   SOB (shortness of breath) on exertion    Symptoms c/w deconditioning. No wheezing. Patient has loose diagnosis of asthma in past however reports that SOB isnt seasonal and no identifiable triggers. More concern that SOB may be indicative of degree of anemia. SaO2 97%. HR 90. Will closely follow.        Other Visit Diagnoses   None.      I am having Ms. Carnero start on ferrous sulfate. I am also having her maintain her glimepiride, hydrochlorothiazide, omeprazole, lovastatin, aspirin EC, metFORMIN, furosemide, potassium chloride SA, fluconazole, gabapentin, nystatin, sertraline, triamcinolone cream, naproxen, loratadine, insulin aspart, insulin glargine, mupirocin cream, HYDROcodone-acetaminophen, traMADol, and cephALEXin.   Meds ordered this  encounter  Medications  . traMADol (ULTRAM) 50 MG tablet    Refill:  0  . cephALEXin (KEFLEX) 500 MG capsule    Refill:  0  . ferrous sulfate 325 (65 FE) MG tablet    Sig: Take 1 tablet (325 mg total) by mouth daily with breakfast.    Dispense:  30 tablet    Refill:  3    Order Specific Question:   Supervising Provider    Answer:   Sherlene Shams [[id number]]    Return precautions given.   Risks, benefits, and alternatives of the medications and treatment plan prescribed today were discussed, and patient expressed understanding.   Education regarding symptom management and diagnosis given to patient on AVS.  Continue to follow with Rennie Plowman, FNP for routine health maintenance.   Scherrie Gerlach Hennings and I agreed with plan.   Rennie Plowman, FNP

## 2015-11-03 NOTE — Patient Instructions (Addendum)
Lab work.   If you become short of breath or more dizzy, please go to ED. Concerned as we do not know source of blood loss at this time.

## 2015-11-03 NOTE — Telephone Encounter (Signed)
Patient wanted a change in medication.  Bactroban cream to Bactroban gel.

## 2015-11-03 NOTE — Assessment & Plan Note (Addendum)
No adventitious lung sounds or SOB today to suggest CHF exacerbation. Pending BNP and BMP to evaluate volume status (patient is prescribed lasix by cardiology) . CXR 8/31 showed no definite CHF which is reassuring.  Discussed with patient my concern with starting an inhaler as false sense of treatment and may worsen diastolic CHF. Advised patient that if she has any worsening of SOB, she should go to ED as my concern would be worsening anemia or CHF exacerbation.

## 2015-11-03 NOTE — Assessment & Plan Note (Addendum)
Symptoms c/w deconditioning. No wheezing. Patient has loose diagnosis of asthma in past however reports that SOB isnt seasonal and no identifiable triggers. More concern that SOB may be indicative of degree of anemia. SaO2 97%. HR 90. Will closely follow.

## 2015-11-04 LAB — CBC WITH DIFFERENTIAL/PLATELET
BASOS PCT: 0.2 % (ref 0.0–3.0)
Basophils Absolute: 0 10*3/uL (ref 0.0–0.1)
EOS PCT: 3.8 % (ref 0.0–5.0)
Eosinophils Absolute: 0.4 10*3/uL (ref 0.0–0.7)
HCT: 28 % — ABNORMAL LOW (ref 36.0–46.0)
Lymphocytes Relative: 12.2 % (ref 12.0–46.0)
Lymphs Abs: 1.4 10*3/uL (ref 0.7–4.0)
MCHC: 31.6 g/dL (ref 30.0–36.0)
MCV: 75.6 fl — AB (ref 78.0–100.0)
MONO ABS: 0.6 10*3/uL (ref 0.1–1.0)
MONOS PCT: 5.1 % (ref 3.0–12.0)
Neutro Abs: 8.8 10*3/uL — ABNORMAL HIGH (ref 1.4–7.7)
Neutrophils Relative %: 78.7 % — ABNORMAL HIGH (ref 43.0–77.0)
Platelets: 267 10*3/uL (ref 150.0–400.0)
RBC: 3.7 Mil/uL — AB (ref 3.87–5.11)
RDW: 19.6 % — AB (ref 11.5–15.5)
WBC: 11.2 10*3/uL — AB (ref 4.0–10.5)

## 2015-11-04 LAB — HOMOCYSTEINE: HOMOCYSTEINE: 67.7 umol/L — AB (ref <10.4)

## 2015-11-04 LAB — IBC PANEL
Iron: 84 ug/dL (ref 42–145)
SATURATION RATIOS: 23.9 % (ref 20.0–50.0)
Transferrin: 251 mg/dL (ref 212.0–360.0)

## 2015-11-04 LAB — HEMOGLOBIN A1C: Hgb A1c MFr Bld: 9.1 % — ABNORMAL HIGH (ref 4.6–6.5)

## 2015-11-04 LAB — LIPID PANEL
CHOL/HDL RATIO: 3
Cholesterol: 151 mg/dL (ref 0–200)
HDL: 49.7 mg/dL (ref >39.00)
LDL CALC: 73 mg/dL (ref 0–99)
NONHDL: 101.63
Triglycerides: 142 mg/dL (ref 0.0–149.0)
VLDL: 28.4 mg/dL (ref 0.0–40.0)

## 2015-11-04 LAB — HEPATITIS C ANTIBODY: HCV Ab: NEGATIVE

## 2015-11-04 LAB — BRAIN NATRIURETIC PEPTIDE: BRAIN NATRIURETIC PEPTIDE: 30.9 pg/mL (ref <100)

## 2015-11-04 NOTE — Progress Notes (Signed)
Patient has both aoppointments scheduled already.

## 2015-11-05 ENCOUNTER — Other Ambulatory Visit: Payer: Self-pay | Admitting: Family

## 2015-11-05 DIAGNOSIS — D649 Anemia, unspecified: Secondary | ICD-10-CM

## 2015-11-05 LAB — VITAMIN D 25 HYDROXY (VIT D DEFICIENCY, FRACTURES): VITD: 21.67 ng/mL — AB (ref 30.00–100.00)

## 2015-11-05 LAB — TSH: TSH: 2.26 u[IU]/mL (ref 0.35–4.50)

## 2015-11-05 LAB — FERRITIN: FERRITIN: 27.3 ng/mL (ref 10.0–291.0)

## 2015-11-05 LAB — B12 AND FOLATE PANEL
Folate: 14 ng/mL (ref >5.9)
Vitamin B-12: 416 pg/mL (ref 211–911)

## 2015-11-05 LAB — METHYLMALONIC ACID, SERUM: METHYLMALONIC ACID, QUANT: 700 nmol/L — AB (ref 87–318)

## 2015-11-06 ENCOUNTER — Encounter: Payer: Medicare Other | Attending: Surgery | Admitting: Surgery

## 2015-11-06 DIAGNOSIS — S31105S Unspecified open wound of abdominal wall, periumbilic region without penetration into peritoneal cavity, sequela: Secondary | ICD-10-CM | POA: Insufficient documentation

## 2015-11-06 DIAGNOSIS — E11622 Type 2 diabetes mellitus with other skin ulcer: Secondary | ICD-10-CM | POA: Diagnosis present

## 2015-11-06 DIAGNOSIS — E785 Hyperlipidemia, unspecified: Secondary | ICD-10-CM | POA: Diagnosis not present

## 2015-11-06 DIAGNOSIS — L304 Erythema intertrigo: Secondary | ICD-10-CM | POA: Diagnosis not present

## 2015-11-06 DIAGNOSIS — B369 Superficial mycosis, unspecified: Secondary | ICD-10-CM | POA: Diagnosis not present

## 2015-11-06 DIAGNOSIS — Z6841 Body Mass Index (BMI) 40.0 and over, adult: Secondary | ICD-10-CM | POA: Insufficient documentation

## 2015-11-06 DIAGNOSIS — L03311 Cellulitis of abdominal wall: Secondary | ICD-10-CM | POA: Insufficient documentation

## 2015-11-06 DIAGNOSIS — I10 Essential (primary) hypertension: Secondary | ICD-10-CM | POA: Diagnosis not present

## 2015-11-06 DIAGNOSIS — X58XXXS Exposure to other specified factors, sequela: Secondary | ICD-10-CM | POA: Insufficient documentation

## 2015-11-06 LAB — INTRINSIC FACTOR ANTIBODIES: Intrinsic Factor: NEGATIVE

## 2015-11-06 MED ORDER — CYANOCOBALAMIN 1000 MCG/ML IJ SOLN: INTRAMUSCULAR | 9 refills | Status: DC

## 2015-11-06 NOTE — Progress Notes (Signed)
Spoken to care giver, patient has been having the same SX of light headedness and dizziness yesterday. The antibiotics have been making the patient have diarrhea. Other than that she is doing well.

## 2015-11-07 NOTE — Progress Notes (Signed)
[name], [name] ([id number]) Visit Report for 11/06/2015 Chief Complaint Document Details Patient Name: [name], [name]. Date of Service: 11/06/2015 12:45 PM Medical Record Number: [id number] Patient Account Number: [id number] Date of Birth/Sex: 12-15-1945 (70 y.o. Female) Treating RN: Ashok Cordia, Debi Primary Care Physician: Rennie Plowman Other Clinician: Referring Physician: Rennie Plowman Treating Physician/Extender: Rudene Re in Treatment: 8 Information Obtained from: Patient Chief Complaint Patients presents for treatment of an open diabetic ulcer the region of both groins for about 3 months Electronic Signature(s) Signed: 11/06/2015 1:36:40 PM By: Evlyn Kanner MD, FACS Entered By: Evlyn Kanner on 11/06/2015 13:36:40 [name], [name] ([id number]) -------------------------------------------------------------------------------- HPI Details Patient Name: Kendra Sabal R. Date of Service: 11/06/2015 12:45 PM Medical Record Number: [id number] Patient Account Number: [id number] Date of Birth/Sex: [birth year] (70 y.o. Female) Treating RN: Ashok Cordia, Debi Primary Care Physician: Rennie Plowman Other Clinician: Referring Physician: Rennie Plowman Treating Physician/Extender: Rudene Re in Treatment: 8 History of Present Illness Location: bilateral groin and lower abdomen and pannus ulcerations Quality: Patient reports experiencing a sharp pain to affected area(s). Severity: Patient states wound are getting worse. Duration: Patient has had the wound for > 3 months prior to seeking treatment at the wound center Timing: Pain in wound is constant (hurts all the time) Context: The wound appeared gradually over time Modifying Factors: Other treatment(s) tried include:he has seen a dermatologist who has prescribed antifungal, antibiotics and local care Associated Signs and Symptoms: Patient reports having difficulty standing for long periods. HPI Description:  70 year old patient's who is morbidly obese and has several problems has her usual which pannus which is covering most of her lower abdomen and groin. She has multiple ulceration which are tender and this is in the region of both groins. Has been having appropriate treatment from her dermatologist. She has been referred to as from the [facility] dermatology service by Dr. Armida Sans. Was seen there for lesions on the groin draining significant amount of fluid and has been on Diflucan and Alcortin A. Her past medical history is significant for asthma, diabetes, eczema, psoriasis, status post hysterectomy and status post ankle surgery. She was noted to have dermatitis located to her trunk with crusted papules and ulcer located in her groin, the largest being on the right side approximately 1 cm. The patient also has intertrigo located to her groin bilaterally. His assessment was that of neurodermatitis, ulcer with Pseudomonas collaterization and for this he had recommended ciprofloxacin for 7 days. He had also started her on Diflucan for 3 weeks. 10/19/2015 -- a stack of notes were received from her previous physicians in [facility]. Medical history of diabetes mellitus type 2, morbid obesity, hyperlipidemia, hypertension, history of CEA in March 2016, hypothyroidism, fibroadenoma of the breast, right carotid disease, status post hysterectomy in 2011, fracture left ankle with multiple surgeries in 2004. Hg A1c was 7% in June 2017. In the past the patient has been treated for scabies in October 2015. Selina Cooley of notes went back to June 2015 and a lot of them were unconnected. 11/06/2015 -- she was seen in the ED on 10/23/2015 at [facility] and workup was done with HSV and bacterial cultures and they recommended Bactroban ointment locally, oral acyclovir and Keflex and asked to follow-up with dermatology. the HSV-1 and 2 PCR was negative. aerobic cultures showed Proteus  mirabilis and coagulates negative Staphylococcus aureus Electronic Signature(s) Signed: 11/06/2015 1:40:39 PM By: Evlyn Kanner MD, FACS Entered By: Evlyn Kanner on 11/06/2015 13:40:39 [name], [name] ([id number])  [name], [name] ([id number]) -------------------------------------------------------------------------------- Physical Exam Details Patient Name: [name], [name] Date of Service: 11/06/2015 12:45 PM Medical Record Number: [id number] Patient Account Number: [id number] Date of Birth/Sex: 06/26/1945 (70 y.o. Female) Treating RN: Ashok Cordia, Debi Primary Care Physician: Rennie Plowman Other Clinician: Referring Physician: Rennie Plowman Treating Physician/Extender: Rudene Re in Treatment: 8 Constitutional . Pulse regular. Respirations normal and unlabored. Afebrile. . Eyes Nonicteric. Reactive to light. Ears, Nose, Mouth, and Throat Lips, teeth, and gums WNL.Marland Kitchen Moist mucosa without lesions. Neck supple and nontender. No palpable supraclavicular or cervical adenopathy. Normal sized without goiter. Respiratory WNL. No retractions.. Breath sounds WNL, No rubs, rales, rhonchi, or wheeze.. Cardiovascular Heart rhythm and rate regular, no murmur or gallop.. Pedal Pulses WNL. No clubbing, cyanosis or edema. Lymphatic No adneopathy. No adenopathy. No adenopathy. Musculoskeletal Adexa without tenderness or enlargement.. Digits and nails w/o clubbing, cyanosis, infection, petechiae, ischemia, or inflammatory conditions.. Integumentary (Hair, Skin) No suspicious lesions. No crepitus or fluctuance. No peri-wound warmth or erythema. No masses.Marland Kitchen Psychiatric Judgement and insight Intact.. No evidence of depression, anxiety, or agitation.. Notes the patient continues to have wounds on her lower abdomen and pannus and tenderness persist. They are looking better than the last time. Electronic Signature(s) Signed: 11/06/2015 1:41:15 PM By: Evlyn Kanner MD, FACS Entered By:  Evlyn Kanner on 11/06/2015 13:41:14 [name], [name] ([id number]) -------------------------------------------------------------------------------- Physician Orders Details Patient Name: Kendra Sabal R. Date of Service: 11/06/2015 12:45 PM Medical Record Number: [id number] Patient Account Number: [id number] Date of Birth/Sex: 1945-06-04 (70 y.o. Female) Treating RN: Ashok Cordia, Debi Primary Care Physician: Rennie Plowman Other Clinician: Referring Physician: Rennie Plowman Treating Physician/Extender: Rudene Re in Treatment: 8 Verbal / Phone Orders: Yes Clinician: Pinkerton, Debi Read Back and Verified: Yes Diagnosis Coding Wound Cleansing Wound #1 Circumferential Abdomen - Lower Quadrant o Clean wound with Normal Saline. Wound #2 Pubis o Clean wound with Normal Saline. Wound #3 Groin o Clean wound with Normal Saline. Primary Wound Dressing Wound #1 Circumferential Abdomen - Lower Quadrant o Bactroban o Aquacel Ag Wound #2 Pubis o Bactroban o Aquacel Ag Wound #3 Groin o Bactroban o Aquacel Ag Secondary Dressing Wound #1 Circumferential Abdomen - Lower Quadrant o ABD pad Wound #2 Pubis o ABD pad Wound #3 Groin o ABD pad Dressing Change Frequency Wound #1 Circumferential Abdomen - Lower Quadrant o Change dressing every other day. [name], [name] ([id number]) Wound #2 Pubis o Change dressing every other day. Wound #3 Groin o Change dressing every other day. Follow-up Appointments Wound #1 Circumferential Abdomen - Lower Quadrant o Return Appointment in 1 week. - on Monday Wound #2 Pubis o Return Appointment in 1 week. - on Monday Wound #3 Groin o Return Appointment in 1 week. - on Monday Off-Loading o Turn and reposition every 2 hours Additional Orders / Instructions Wound #1 Circumferential Abdomen - Lower Quadrant o Increase protein intake. o Activity as tolerated Wound #2 Pubis o Increase protein  intake. o Activity as tolerated Wound #3 Groin o Increase protein intake. o Activity as tolerated Electronic Signature(s) Signed: 11/06/2015 1:43:20 PM By: Evlyn Kanner MD, FACS Signed: 11/06/2015 4:15:56 PM By: Alejandro Mulling Entered By: Alejandro Mulling on 11/06/2015 13:14:28 [name], [name] ([id number]) -------------------------------------------------------------------------------- Problem List Details Patient Name: MELVENIA, FAVELA R. Date of Service: 11/06/2015 12:45 PM Medical Record Number: [id number] Patient Account Number: [id number] Date of Birth/Sex: 02-18-1946 (70 y.o. Female) Treating RN: Ashok Cordia, Debi Primary Care Physician: Rennie Plowman Other Clinician: Referring Physician: Rennie Plowman Treating Physician/Extender: Rudene Re in Treatment: 8  Active Problems ICD-10 Encounter Code Description Active Date Diagnosis E11.622 Type 2 diabetes mellitus with other skin ulcer 09/14/2015 Yes B36.9 Superficial mycosis, unspecified 09/14/2015 Yes E66.01 Morbid (severe) obesity due to excess calories 09/14/2015 Yes L30.4 Erythema intertrigo 09/14/2015 Yes L03.311 Cellulitis of abdominal wall 09/14/2015 Yes S31.105S Unspecified open wound of abdominal wall, periumbilic 09/14/2015 Yes region without penetration into peritoneal cavity, sequela Inactive Problems Resolved Problems Electronic Signature(s) Signed: 11/06/2015 1:36:34 PM By: Evlyn KannerBritto, Michaelpaul Apo MD, FACS Entered By: Evlyn KannerBritto, Kylie Gros on 11/06/2015 13:36:34 Tallerico, Cailey R. ([id number]) -------------------------------------------------------------------------------- Progress Note Details Patient Name: [name], [name] Date of Service: 11/06/2015 12:45 PM Medical Record Number: [id number] Patient Account Number: [id number] Date of Birth/Sex: 04/19/1945 (70 y.o. Female) Treating RN: Ashok CordiaPinkerton, Debi Primary Care Physician: Rennie PlowmanARNETT, MARGARET Other Clinician: Referring Physician: Rennie PlowmanARNETT, MARGARET Treating  Physician/Extender: Rudene ReBritto, Emilynn Srinivasan Weeks in Treatment: 8 Subjective Chief Complaint Information obtained from Patient Patients presents for treatment of an open diabetic ulcer the region of both groins for about 3 months History of Present Illness (HPI) The following HPI elements were documented for the patient's wound: Location: bilateral groin and lower abdomen and pannus ulcerations Quality: Patient reports experiencing a sharp pain to affected area(s). Severity: Patient states wound are getting worse. Duration: Patient has had the wound for > 3 months prior to seeking treatment at the wound center Timing: Pain in wound is constant (hurts all the time) Context: The wound appeared gradually over time Modifying Factors: Other treatment(s) tried include:he has seen a dermatologist who has prescribed antifungal, antibiotics and local care Associated Signs and Symptoms: Patient reports having difficulty standing for long periods. 70 year old patient's who is morbidly obese and has several problems has her usual which pannus which is covering most of her lower abdomen and groin. She has multiple ulceration which are tender and this is in the region of both groins. Has been having appropriate treatment from her dermatologist. She has [facility] by Dr. Armida Sansavid Kowalski. Was seen there for lesions on the groin draining significant amount of fluid and has been on Diflucan and Alcortin A. Her past medical history is significant for asthma, diabetes, eczema, psoriasis, status post hysterectomy and status post ankle surgery. She was noted to have dermatitis located to her trunk with crusted papules and ulcer located in her groin, the largest being on the right side approximately 1 cm. The patient also has intertrigo located to her groin bilaterally. His assessment was that of neurodermatitis, ulcer with Pseudomonas collaterization and for this he  had recommended ciprofloxacin for 7 days. He had also started her on Diflucan for 3 weeks. 10/19/2015 -- a stack of notes were received from her previous physicians in [facility]. Medical history of diabetes mellitus type 2, morbid obesity, hyperlipidemia, hypertension, history of CEA in March 2016, hypothyroidism, fibroadenoma of the breast, right carotid disease, status post hysterectomy in 2011, fracture left ankle with multiple surgeries in 2004. Hg A1c was 7% in June 2017. In the past the patient has been treated for scabies in October 2015. Selina CooleyStack of notes went back to June 2015 and a lot of them were unconnected. 11/06/2015 -- she was seen in [facility] ED on 10[facility] and workup was done with HSV and bacterial cultures and they recommended Bactroban ointment locally, oral acyclovir and Keflex and asked to follow-up with dermatology. the HSV-1 and 2 PCR was negative. [name], [name] ([id number]) aerobic cultures showed Proteus mirabilis and coagulates negative Staphylococcus  aureus Objective Constitutional Pulse regular. Respirations normal and unlabored. Afebrile. Vitals Time Taken: 12:52 PM, Height: 62 in, Weight: 251 lbs, BMI: 45.9, Temperature: 97.8 F, Pulse: 87 bpm, Respiratory Rate: 20 breaths/min, Blood Pressure: 151/84 mmHg. Eyes Nonicteric. Reactive to light. Ears, Nose, Mouth, and Throat Lips, teeth, and gums WNL.Marland Kitchen Moist mucosa without lesions. Neck supple and nontender. No palpable supraclavicular or cervical adenopathy. Normal sized without goiter. Respiratory WNL. No retractions.. Breath sounds WNL, No rubs, rales, rhonchi, or wheeze.. Cardiovascular Heart rhythm and rate regular, no murmur or gallop.. Pedal Pulses WNL. No clubbing, cyanosis or edema. Lymphatic No adneopathy. No adenopathy. No adenopathy. Musculoskeletal Adexa without tenderness or enlargement.. Digits and nails w/o clubbing, cyanosis, infection,  petechiae, ischemia, or inflammatory conditions.Marland Kitchen Psychiatric Judgement and insight Intact.. No evidence of depression, anxiety, or agitation.. General Notes: the patient continues to have wounds on her lower abdomen and pannus and tenderness persist. They are looking better than the last time. Integumentary (Hair, Skin) No suspicious lesions. No crepitus or fluctuance. No peri-wound warmth or erythema. No masses.. Wound #1 status is Open. Original cause of wound was Other Lesion. The wound is located on the Circumferential Abdomen - Lower Quadrant. The wound measures 42cm length x 11cm width x 0.1cm Breach, Ysabel R. ([id number]) depth; 362.854cm^2 area and 36.285cm^3 volume. The wound is limited to skin breakdown. There is no tunneling or undermining noted. There is a large amount of serosanguineous drainage noted. The wound margin is indistinct and nonvisible. There is large (67-100%) red, pink granulation within the wound bed. There is a small (1-33%) amount of necrotic tissue within the wound bed including Adherent Slough. The periwound skin appearance exhibited: Localized Edema, Moist, Erythema. The surrounding wound skin color is noted with erythema which is circumferential. Periwound temperature was noted as No Abnormality. The periwound has tenderness on palpation. Wound #2 status is Open. Original cause of wound was Other Lesion. The wound is located on the Pubis. The wound measures 5cm length x 5cm width x 0.1cm depth; 19.635cm^2 area and 1.963cm^3 volume. The wound is limited to skin breakdown. There is no tunneling or undermining noted. There is a large amount of serous drainage noted. The wound margin is indistinct and nonvisible. There is large (67-100%) red, pink granulation within the wound bed. There is a small (1-33%) amount of necrotic tissue within the wound bed including Adherent Slough. The periwound skin appearance exhibited: Localized Edema, Moist, Erythema. The  surrounding wound skin color is noted with erythema which is circumferential. Periwound temperature was noted as No Abnormality. The periwound has tenderness on palpation. Wound #3 status is Open. Original cause of wound was Other Lesion. The wound is located on the Groin. The wound measures 4cm length x 4cm width x 0.1cm depth; 12.566cm^2 area and 1.257cm^3 volume. The wound is limited to skin breakdown. There is no tunneling or undermining noted. There is a large amount of serosanguineous drainage noted. The wound margin is indistinct and nonvisible. There is large (67-100%) red, pink granulation within the wound bed. There is a small (1-33%) amount of necrotic tissue within the wound bed including Adherent Slough. The periwound skin appearance exhibited: Localized Edema, Moist, Erythema. The surrounding wound skin color is noted with erythema which is circumferential. Periwound temperature was noted as No Abnormality. The periwound has tenderness on palpation. Assessment Active Problems ICD-10 E11.622 - Type 2 diabetes mellitus with other skin ulcer B36.9 - Superficial mycosis, unspecified E66.01 - Morbid (severe) obesity due to excess calories L30.4 - Erythema intertrigo  L03.311 - Cellulitis of abdominal wall S31.105S - Unspecified open wound of abdominal wall, periumbilic region without penetration into peritoneal cavity, sequela Plan Wound Cleansing: Spanos, Corabelle R. ([id number]) Wound #1 Circumferential Abdomen - Lower Quadrant: Clean wound with Normal Saline. Wound #2 Pubis: Clean wound with Normal Saline. Wound #3 Groin: Clean wound with Normal Saline. Primary Wound Dressing: Wound #1 Circumferential Abdomen - Lower Quadrant: Bactroban Aquacel Ag Wound #2 Pubis: Bactroban Aquacel Ag Wound #3 Groin: Bactroban Aquacel Ag Secondary Dressing: Wound #1 Circumferential Abdomen - Lower Quadrant: ABD pad Wound #2 Pubis: ABD pad Wound #3 Groin: ABD pad Dressing Change  Frequency: Wound #1 Circumferential Abdomen - Lower Quadrant: Change dressing every other day. Wound #2 Pubis: Change dressing every other day. Wound #3 Groin: Change dressing every other day. Follow-up Appointments: Wound #1 Circumferential Abdomen - Lower Quadrant: Return Appointment in 1 week. - on Monday Wound #2 Pubis: Return Appointment in 1 week. - on Monday Wound #3 Groin: Return Appointment in 1 week. - on Monday Off-Loading: Turn and reposition every 2 hours Additional Orders / Instructions: Wound #1 Circumferential Abdomen - Lower Quadrant: Increase protein intake. Activity as tolerated Wound #2 Pubis: Increase protein intake. Activity as tolerated Wound #3 Groin: Increase protein intake. Activity as tolerated Beauchaine, Dorann R. ([id number]) She is encouraged to continue good control of her diabetes mellitus and see her dermatologist on a regular intervals. She has also been encouraged to see a tertiary care hospital for an opinion regarding her complex problem which will require a multidisciplinary team approach. She has an appointment pending with you and see dermatology. In the meanwhile I've asked her to continue with Bactroban, Aquacel Ag and her oral antibiotics as prescribed by them. All their questions have been answered and they will see [place] back next week. Electronic Signature(s) Signed: 11/06/2015 1:42:33 PM By: Evlyn Kanner MD, FACS Entered By: Evlyn Kanner on 11/06/2015 13:42:33 Borden, Natally RMarland Kitchen ([id number]) -------------------------------------------------------------------------------- SuperBill Details Patient Name: Kendra Sabal R. Date of Service: 11/06/2015 Medical Record Number: [id number] Patient Account Number: [id number] Date of Birth/Sex: June 04, 1945 (70 y.o. Female) Treating RN: Ashok Cordia, Debi Primary Care Physician: Rennie Plowman Other Clinician: Referring Physician: Rennie Plowman Treating Physician/Extender: Rudene Re in  Treatment: 8 Diagnosis Coding ICD-10 Codes Code Description E11.622 Type 2 diabetes mellitus with other skin ulcer B36.9 Superficial mycosis, unspecified E66.01 Morbid (severe) obesity due to excess calories L30.4 Erythema intertrigo L03.311 Cellulitis of abdominal wall Unspecified open wound of abdominal wall, periumbilic region without penetration into S31.105S peritoneal cavity, sequela Physician Procedures CPT4 Code: [id number] Description: 99213 - WC PHYS LEVEL 3 - EST PT ICD-10 Description Diagnosis E11.622 Type 2 diabetes mellitus with other skin ulc B36.9 Superficial mycosis, unspecified E66.01 Morbid (severe) obesity due to excess calori L03.311 Cellulitis of abdominal  wall Modifier: er es Quantity: 1 Electronic Signature(s) Signed: 11/06/2015 1:42:52 PM By: Evlyn Kanner MD, FACS Entered By: Evlyn Kanner on 11/06/2015 13:42:52

## 2015-11-07 NOTE — Progress Notes (Signed)
[name], [name] ([id number]) Visit Report for 11/06/2015 Arrival Information Details Patient Name: [name], [name]. Date of Service: 11/06/2015 12:45 PM Medical Record Number: [id number] Patient Account Number: [id number] Date of Birth/Sex: 1945-06-14 (69 y.o. Female) Treating RN: Ashok Cordia, Debi Primary Care Physician: Rennie Plowman Other Clinician: Referring Physician: Rennie Plowman Treating Physician/Extender: Rudene Re in Treatment: 8 Visit Information History Since Last Visit All ordered tests and consults were completed: No Patient Arrived: Wheel Chair Added or deleted any medications: Yes Arrival Time: 12:51 Any new allergies or adverse reactions: No Accompanied By: caregiver Had a fall or experienced change in No Transfer Assistance: EasyPivot activities of daily living that may affect Patient Lift risk of falls: Patient Identification Verified: Yes Signs or symptoms of abuse/neglect since last No Secondary Verification Process Yes visito Completed: Hospitalized since last visit: No Patient Requires Transmission- No Pain Present Now: Yes Based Precautions: Patient Has Alerts: No Electronic Signature(s) Signed: 11/06/2015 4:15:56 PM By: Alejandro Mulling Entered By: Alejandro Mulling on 11/06/2015 12:52:00 [name], [name] ([id number]) -------------------------------------------------------------------------------- Encounter Discharge Information Details Patient Name: Kendra Sabal R. Date of Service: 11/06/2015 12:45 PM Medical Record Number: [id number] Patient Account Number: [id number] Date of Birth/Sex: [birth year] (69 y.o. Female) Treating RN: Ashok Cordia, Debi Primary Care Physician: Rennie Plowman Other Clinician: Referring Physician: Rennie Plowman Treating Physician/Extender: Rudene Re in Treatment: 8 Encounter Discharge Information Items Discharge Pain Level: 0 Discharge Condition: Stable Ambulatory Status: Wheelchair Discharge  Destination: Home Transportation: Private Auto Accompanied By: CAREGIVER Schedule Follow-up Appointment: Yes Medication Reconciliation completed and provided to Patient/Care Yes [name]: Provided on Clinical Summary of Care: 11/06/2015 Form Type Recipient Paper Patient DP Electronic Signature(s) Signed: 11/06/2015 1:21:22 PM By: Gwenlyn Perking Entered By: Gwenlyn Perking on 11/06/2015 13:21:22 [name], [name] ([id number]) -------------------------------------------------------------------------------- Lower Extremity Assessment Details Patient Name: [name], [name] Date of Service: 11/06/2015 12:45 PM Medical Record Number: [id number] Patient Account Number: [id number] Date of Birth/Sex: [birth year] (69 y.o. Female) Treating RN: Phillis Haggis Primary Care Physician: Rennie Plowman Other Clinician: Referring Physician: Rennie Plowman Treating Physician/Extender: Rudene Re in Treatment: 8 Electronic Signature(s) Signed: 11/06/2015 4:15:56 PM By: Alejandro Mulling Entered By: Alejandro Mulling on 11/06/2015 12:52:42 Kasprzak, Jailine R. ([id number]) -------------------------------------------------------------------------------- Multi Wound Chart Details Patient Name: Kendra Sabal R. Date of Service: 11/06/2015 12:45 PM Medical Record Number: [id number] Patient Account Number: [id number] Date of Birth/Sex: 28-Sep-1945 (69 y.o. Female) Treating RN: Ashok Cordia, Debi Primary Care Physician: Rennie Plowman Other Clinician: Referring Physician: Rennie Plowman Treating Physician/Extender: Rudene Re in Treatment: 8 Vital Signs Height(in): 62 Pulse(bpm): 87 Weight(lbs): 251 Blood Pressure 151/84 (mmHg): Body Mass Index(BMI): 46 Temperature(F): 97.8 Respiratory Rate 20 (breaths/min): Photos: [1:No Photos] [2:No Photos] [3:No Photos] Wound Location: [1:Circumferential Abdomen - Lower Quadrant] [2:Pubis] [3:Groin] Wounding Event: [1:Other Lesion] [2:Other  Lesion] [3:Other Lesion] Primary Etiology: [1:Hidradenitis] [2:Hidradenitis] [3:Hidradenitis] Comorbid History: [1:Cataracts, Congestive Heart Failure, Hypertension, Type II Diabetes, Osteoarthritis, Neuropathy] [2:Cataracts, Congestive Heart Failure, Hypertension, Type II Diabetes, Osteoarthritis, Neuropathy] [3:Cataracts, Congestive Heart  Failure, Hypertension, Type II Diabetes, Osteoarthritis, Neuropathy] Date Acquired: [1:06/11/2015] [2:06/11/2015] [3:06/11/2015] Weeks of Treatment: [1:8] [2:8] [3:8] Wound Status: [1:Open] [2:Open] [3:Open] Measurements L x W x D 42x11x0.1 [2:5x5x0.1] [3:4x4x0.1] (cm) Area (cm) : [1:362.854] [2:19.635] [3:12.566] Volume (cm) : [1:36.285] [2:1.963] [3:1.257] % Reduction in Area: [1:74.80%] [2:50.00%] [3:-33.30%] % Reduction in Volume: 74.80% [2:75.00%] [3:-33.40%] Classification: [1:Full Thickness With Exposed Support Structures] [2:Full Thickness With Exposed Support Structures] [3:Full Thickness With Exposed Support Structures] HBO Classification: [1:N/A] [2:N/A] [3:Grade 1] Exudate Amount: [1:Large] [2:Large] [3:Large] Exudate Type: [1:Serosanguineous] [2:Serous] [  3:Serosanguineous] Exudate Color: [1:red, brown] [2:amber] [3:red, brown] Wound Margin: [1:Indistinct, nonvisible] [2:Indistinct, nonvisible] [3:Indistinct, nonvisible] Granulation Amount: [1:Large (67-100%)] [2:Large (67-100%)] [3:Large (67-100%)] Granulation Quality: [1:Red, Pink] [2:Red, Pink] [3:Red, Pink] Necrotic Amount: [1:Small (1-33%)] [2:Small (1-33%)] [3:Small (1-33%)] Exposed Structures: Fascia: No Fascia: No Fascia: No Fat: No Fat: No Fat: No Tendon: No Tendon: No Tendon: No Muscle: No Muscle: No Muscle: No Joint: No Joint: No Joint: No Bone: No Bone: No Bone: No Limited to Skin Limited to Skin Limited to Skin Breakdown Breakdown Breakdown Epithelialization: Large (67-100%) Medium (34-66%) None Periwound Skin Texture: Edema: Yes Edema: Yes Edema:  Yes Periwound Skin Moist: Yes Moist: Yes Moist: Yes Moisture: Periwound Skin Color: Erythema: Yes Erythema: Yes Erythema: Yes Erythema Location: Circumferential Circumferential Circumferential Temperature: No Abnormality No Abnormality No Abnormality Tenderness on Yes Yes Yes Palpation: Wound Preparation: Ulcer Cleansing: Ulcer Cleansing: Ulcer Cleansing: Rinsed/Irrigated with Rinsed/Irrigated with Rinsed/Irrigated with Saline Saline Saline Topical Anesthetic Topical Anesthetic Topical Anesthetic Applied: None Applied: None Applied: None Treatment Notes Electronic Signature(s) Signed: 11/06/2015 4:15:56 PM By: Alejandro Mulling Entered By: Alejandro Mulling on 11/06/2015 12:59:33 [name], [name] ([id number]) -------------------------------------------------------------------------------- Multi-Disciplinary Care Plan Details Patient Name: Kendra Sabal R. Date of Service: 11/06/2015 12:45 PM Medical Record Number: [id number] Patient Account Number: [id number] Date of Birth/Sex: [birth year] (69 y.o. Female) Treating RN: Ashok Cordia, Debi Primary Care Physician: Rennie Plowman Other Clinician: Referring Physician: Rennie Plowman Treating Physician/Extender: Rudene Re in Treatment: 8 Active Inactive Orientation to the Wound Care Program Nursing Diagnoses: Knowledge deficit related to the wound healing center program Goals: Patient/caregiver will verbalize understanding of the Wound Healing Center Program Date Initiated: 09/10/2015 Goal Status: Active Interventions: Provide education on orientation to the wound center Notes: Wound/Skin Impairment Nursing Diagnoses: Impaired tissue integrity Knowledge deficit related to ulceration/compromised skin integrity Goals: Patient/caregiver will verbalize understanding of skin care regimen Date Initiated: 09/10/2015 Goal Status: Active Ulcer/skin breakdown will have a volume reduction of 30% by week 4 Date Initiated:  09/10/2015 Goal Status: Active Ulcer/skin breakdown will have a volume reduction of 50% by week 8 Date Initiated: 09/10/2015 Goal Status: Active Ulcer/skin breakdown will have a volume reduction of 80% by week 12 Date Initiated: 09/10/2015 Goal Status: Active Ulcer/skin breakdown will heal within 14 weeks Date Initiated: 09/10/2015 CAELA, SHANNON ([id number]) Goal Status: Active Interventions: Assess patient/caregiver ability to obtain necessary supplies Assess patient/caregiver ability to perform ulcer/skin care regimen upon admission and as needed Assess ulceration(s) every visit Provide education on ulcer and skin care Treatment Activities: Referred to DME Rayn Enderson for dressing supplies : 09/10/2015 Skin care regimen initiated : 09/10/2015 Topical wound management initiated : 09/10/2015 Notes: Electronic Signature(s) Signed: 11/06/2015 4:15:56 PM By: Alejandro Mulling Entered By: Alejandro Mulling on 11/06/2015 12:59:27 [name], [name] ([id number]) -------------------------------------------------------------------------------- Pain Assessment Details Patient Name: Kendra Sabal R. Date of Service: 11/06/2015 12:45 PM Medical Record Number: [id number] Patient Account Number: [id number] Date of Birth/Sex: Nov 14, 1945 (69 y.o. Female) Treating RN: Ashok Cordia, Debi Primary Care Physician: Rennie Plowman Other Clinician: Referring Physician: Rennie Plowman Treating Physician/Extender: Rudene Re in Treatment: 8 Active Problems Location of Pain Severity and Description of Pain Patient Has Paino No Site Locations With Dressing Change: No Pain Management and Medication Current Pain Management: Electronic Signature(s) Signed: 11/06/2015 4:15:56 PM By: Alejandro Mulling Entered By: Alejandro Mulling on 11/06/2015 12:52:07 [name], [name] ([id number]) -------------------------------------------------------------------------------- Patient/Caregiver Education Details Patient  Name: Kendra Sabal R. Date of Service: 11/06/2015 12:45 PM Medical Record Number: [id number] Patient Account Number: [id number] Date of Birth/Gender: 04/15/1945 (69 y.o.  Female) Treating RN: Ashok Cordia, Debi Primary Care Physician: Rennie Plowman Other Clinician: Referring Physician: Rennie Plowman Treating Physician/Extender: Rudene Re in Treatment: 8 Education Assessment Education Provided To: Patient Education Topics Provided Wound/Skin Impairment: Handouts: Other: CHANGE DRESSING AS ORDERED, KEEP AND GO TO YOUR [facility] APPT. Methods: Demonstration, Explain/Verbal Responses: State content correctly Electronic Signature(s) Signed: 11/06/2015 4:15:56 PM By: Alejandro Mulling Entered By: Alejandro Mulling on 11/06/2015 13:18:34 [name], [name] ([id number]) -------------------------------------------------------------------------------- Wound Assessment Details Patient Name: [name], [name] Date of Service: 11/06/2015 12:45 PM Medical Record Number: [id number] Patient Account Number: [id number] Date of Birth/Sex: 07-02-1945 (69 y.o. Female) Treating RN: Ashok Cordia, Debi Primary Care Physician: Rennie Plowman Other Clinician: Referring Physician: Rennie Plowman Treating Physician/Extender: Rudene Re in Treatment: 8 Wound Status Wound Number: 1 Primary Hidradenitis Etiology: Wound Location: Circumferential Abdomen - Lower Quadrant Wound Open Status: Wounding Event: Other Lesion Comorbid Cataracts, Congestive Heart Failure, Date Acquired: 06/11/2015 History: Hypertension, Type II Diabetes, Weeks Of Treatment: 8 Osteoarthritis, Neuropathy Clustered Wound: No Photos Photo Uploaded By: Alejandro Mulling on 11/06/2015 16:06:20 Wound Measurements Length: (cm) 42 Width: (cm) 11 Depth: (cm) 0.1 Area: (cm) 362.854 Volume: (cm) 36.285 % Reduction in Area: 74.8% % Reduction in Volume: 74.8% Epithelialization: Large (67-100%) Tunneling: No Undermining:  No Wound Description Full Thickness With Exposed Classification: Support Structures Wound Margin: Indistinct, nonvisible Exudate Large Amount: Exudate Type: Serosanguineous Exudate Color: red, brown Foul Odor After Cleansing: No Wound Bed Granulation Amount: Large (67-100%) Exposed Structure Granulation Quality: Red, Pink Fascia Exposed: No [name], Joscelyne R. ([id number]) Necrotic Amount: Small (1-33%) Fat Layer Exposed: No Necrotic Quality: Adherent Slough Tendon Exposed: No Muscle Exposed: No Joint Exposed: No Bone Exposed: No Limited to Skin Breakdown Periwound Skin Texture Texture Color No Abnormalities Noted: No No Abnormalities Noted: No Localized Edema: Yes Erythema: Yes Erythema Location: Circumferential Moisture No Abnormalities Noted: No Temperature / Pain Moist: Yes Temperature: No Abnormality Tenderness on Palpation: Yes Wound Preparation Ulcer Cleansing: Rinsed/Irrigated with Saline Topical Anesthetic Applied: None Treatment Notes Wound #1 (Circumferential Abdomen - Lower Quadrant) 1. Cleansed with: Clean wound with Normal Saline 2. Anesthetic Topical Lidocaine 4% cream to wound bed prior to debridement 4. Dressing Applied: Aquacel Ag Other dressing (specify in notes) 5. Secondary Dressing Applied ABD Pad 7. Secured with Tape Notes Midwife) Signed: 11/06/2015 4:15:56 PM By: Alejandro Mulling Entered By: Alejandro Mulling on 11/06/2015 12:58:48 Avetisyan, Kazi R. ([id number]) -------------------------------------------------------------------------------- Wound Assessment Details Patient Name: Wigger, Quantasia R. Date of Service: 11/06/2015 12:45 PM Medical Record Number: [id number] Patient Account Number: [id number] Date of Birth/Sex: [birth year] (69 y.o. Female) Treating RN: Ashok Cordia, Debi Primary Care Physician: Rennie Plowman Other Clinician: Referring Physician: Rennie Plowman Treating Physician/Extender: Rudene Re in Treatment: 8 Wound Status Wound Number: 2 Primary Hidradenitis Etiology: Wound Location: Pubis Wound Open Wounding Event: Other Lesion Status: Date Acquired: 06/11/2015 Comorbid Cataracts, Congestive Heart Failure, Weeks Of Treatment: 8 History: Hypertension, Type II Diabetes, Clustered Wound: No Osteoarthritis, Neuropathy Photos Photo Uploaded By: Alejandro Mulling on 11/06/2015 16:06:40 Wound Measurements Length: (cm) 5 Width: (cm) 5 Depth: (cm) 0.1 Area: (cm) 19.635 Volume: (cm) 1.963 % Reduction in Area: 50% % Reduction in Volume: 75% Epithelialization: Medium (34-66%) Tunneling: No Undermining: No Wound Description Full Thickness With Exposed Classification: Support Structures Wound Margin: Indistinct, nonvisible Exudate Large Amount: Exudate Type: Serous Exudate Color: amber Foul Odor After Cleansing: No Wound Bed Granulation Amount: Large (67-100%) Exposed Structure Granulation Quality: Red, Pink Fascia Exposed: No Weight, Latonja R. ([id number]) Necrotic Amount: Small (1-33%) Fat Layer  Exposed: No Necrotic Quality: Adherent Slough Tendon Exposed: No Muscle Exposed: No Joint Exposed: No Bone Exposed: No Limited to Skin Breakdown Periwound Skin Texture Texture Color No Abnormalities Noted: No No Abnormalities Noted: No Localized Edema: Yes Erythema: Yes Erythema Location: Circumferential Moisture No Abnormalities Noted: No Temperature / Pain Moist: Yes Temperature: No Abnormality Tenderness on Palpation: Yes Wound Preparation Ulcer Cleansing: Rinsed/Irrigated with Saline Topical Anesthetic Applied: None Treatment Notes Wound #2 (Pubis) 1. Cleansed with: Clean wound with Normal Saline 2. Anesthetic Topical Lidocaine 4% cream to wound bed prior to debridement 4. Dressing Applied: Aquacel Ag Other dressing (specify in notes) 5. Secondary Dressing Applied ABD Pad 7. Secured with Tape Notes Journalist, newspaper) Signed: 11/06/2015 4:15:56 PM By: Alejandro Mulling Entered By: Alejandro Mulling on 11/06/2015 12:59:21 Tsou, Kateryn R. ([id number]) -------------------------------------------------------------------------------- Wound Assessment Details Patient Name: Fredenburg, Jeraldean R. Date of Service: 11/06/2015 12:45 PM Medical Record Number: [id number] Patient Account Number: [id number] Date of Birth/Sex: 09-Oct-1945 (69 y.o. Female) Treating RN: Ashok Cordia, Debi Primary Care Physician: Rennie Plowman Other Clinician: Referring Physician: Rennie Plowman Treating Physician/Extender: Rudene Re in Treatment: 8 Wound Status Wound Number: 3 Primary Hidradenitis Etiology: Wound Location: Groin Wound Open Wounding Event: Other Lesion Status: Date Acquired: 06/11/2015 Comorbid Cataracts, Congestive Heart Failure, Weeks Of Treatment: 8 History: Hypertension, Type II Diabetes, Clustered Wound: No Osteoarthritis, Neuropathy Photos Photo Uploaded By: Alejandro Mulling on 11/06/2015 16:07:20 Wound Measurements Length: (cm) 4 Width: (cm) 4 Depth: (cm) 0.1 Area: (cm) 12.566 Volume: (cm) 1.257 % Reduction in Area: -33.3% % Reduction in Volume: -33.4% Epithelialization: None Tunneling: No Undermining: No Wound Description Full Thickness With Exposed Foul Odor Af Classification: Support Structures Diabetic Severity Grade 1 (Wagner): Wound Margin: Indistinct, nonvisible Exudate Amount: Large Exudate Type: Serosanguineous Exudate Color: red, brown ter Cleansing: No Wound Bed Granulation Amount: Large (67-100%) Exposed Structure Tuft, Juleen R. ([id number]) Granulation Quality: Red, Pink Fascia Exposed: No Necrotic Amount: Small (1-33%) Fat Layer Exposed: No Necrotic Quality: Adherent Slough Tendon Exposed: No Muscle Exposed: No Joint Exposed: No Bone Exposed: No Limited to Skin Breakdown Periwound Skin Texture Texture Color No Abnormalities Noted: No No  Abnormalities Noted: No Localized Edema: Yes Erythema: Yes Erythema Location: Circumferential Moisture No Abnormalities Noted: No Temperature / Pain Moist: Yes Temperature: No Abnormality Tenderness on Palpation: Yes Wound Preparation Ulcer Cleansing: Rinsed/Irrigated with Saline Topical Anesthetic Applied: None Treatment Notes Wound #3 (Groin) 1. Cleansed with: Clean wound with Normal Saline 2. Anesthetic Topical Lidocaine 4% cream to wound bed prior to debridement 4. Dressing Applied: Aquacel Ag Other dressing (specify in notes) 5. Secondary Dressing Applied ABD Pad 7. Secured with Tape Notes Midwife) Signed: 11/06/2015 4:15:56 PM By: Alejandro Mulling Entered By: Alejandro Mulling on 11/06/2015 12:59:11 Corle, Aleysia R. ([id number]) -------------------------------------------------------------------------------- Vitals Details Patient Name: Kendra Sabal R. Date of Service: 11/06/2015 12:45 PM Medical Record Number: [id number] Patient Account Number: [id number] Date of Birth/Sex: 1945/05/04 (69 y.o. Female) Treating RN: Ashok Cordia, Debi Primary Care Physician: Rennie Plowman Other Clinician: Referring Physician: Rennie Plowman Treating Physician/Extender: Rudene Re in Treatment: 8 Vital Signs Time Taken: 12:52 Temperature (F): 97.8 Height (in): 62 Pulse (bpm): 87 Weight (lbs): 251 Respiratory Rate (breaths/min): 20 Body Mass Index (BMI): 45.9 Blood Pressure (mmHg): 151/84 Reference Range: 80 - 120 mg / dl Electronic Signature(s) Signed: 11/06/2015 4:15:56 PM By: Alejandro Mulling Entered By: Alejandro Mulling on 11/06/2015 12:52:33

## 2015-11-09 ENCOUNTER — Telehealth: Payer: Self-pay | Admitting: Family

## 2015-11-09 NOTE — Telephone Encounter (Signed)
Spoke with Alona Bene, see result note for details.

## 2015-11-09 NOTE — Telephone Encounter (Signed)
Pt caregiver called to follow up on pt lab results from 11/03/15. Pt has been weak more than normal since Saturday and about unable to walk. Pt blood looks like it's clotting faster than it should before she can wipe it it coagulate. Please advise?  Call [facility] @ [phone] or [phone]. Thank you!

## 2015-11-10 NOTE — Telephone Encounter (Signed)
Spoke with patient.   Over the weekend felt very weak and could hardly stand up, better today. Not dizzy, fever.  Patient is seeing [facility] today.   Pending appointment with GI for anemia work up 10/12.   I also spoke with caregiver, Alona Bene who feels like she is better than the weekend. Gave her b12 IM 2 days ago.   Will return stool cards  Soon per caregiver.  Re-emphasized the importance of going to ED if SOB, fatigue returns/worsens. Patient and caregiver verbalized understanding.

## 2015-11-15 ENCOUNTER — Emergency Department
Admission: EM | Admit: 2015-11-15 | Discharge: 2015-11-15 | Disposition: A | Discharge status: Home / Self Care | Payer: Medicare Other | Attending: Emergency Medicine | Admitting: Emergency Medicine

## 2015-11-15 ENCOUNTER — Encounter: Payer: Self-pay | Admitting: Emergency Medicine

## 2015-11-15 ENCOUNTER — Emergency Department: Payer: Medicare Other

## 2015-11-15 DIAGNOSIS — R0781 Pleurodynia: Secondary | ICD-10-CM | POA: Diagnosis present

## 2015-11-15 DIAGNOSIS — Y999 Unspecified external cause status: Secondary | ICD-10-CM | POA: Insufficient documentation

## 2015-11-15 DIAGNOSIS — R42 Dizziness and giddiness: Secondary | ICD-10-CM

## 2015-11-15 DIAGNOSIS — I509 Heart failure, unspecified: Secondary | ICD-10-CM | POA: Insufficient documentation

## 2015-11-15 DIAGNOSIS — Y9389 Activity, other specified: Secondary | ICD-10-CM | POA: Diagnosis not present

## 2015-11-15 DIAGNOSIS — J45909 Unspecified asthma, uncomplicated: Secondary | ICD-10-CM | POA: Diagnosis not present

## 2015-11-15 DIAGNOSIS — Y9289 Other specified places as the place of occurrence of the external cause: Secondary | ICD-10-CM | POA: Insufficient documentation

## 2015-11-15 DIAGNOSIS — I110 Hypertensive heart disease with heart failure: Secondary | ICD-10-CM | POA: Diagnosis not present

## 2015-11-15 DIAGNOSIS — S2231XA Fracture of one rib, right side, initial encounter for closed fracture: Secondary | ICD-10-CM

## 2015-11-15 DIAGNOSIS — W1839XA Other fall on same level, initial encounter: Secondary | ICD-10-CM | POA: Diagnosis not present

## 2015-11-15 DIAGNOSIS — S2241XA Multiple fractures of ribs, right side, initial encounter for closed fracture: Secondary | ICD-10-CM | POA: Diagnosis not present

## 2015-11-15 DIAGNOSIS — E119 Type 2 diabetes mellitus without complications: Secondary | ICD-10-CM | POA: Diagnosis not present

## 2015-11-15 DIAGNOSIS — W19XXXA Unspecified fall, initial encounter: Secondary | ICD-10-CM

## 2015-11-15 HISTORY — DX: Anemia, unspecified: D64.9

## 2015-11-15 LAB — URINALYSIS COMPLETE WITH MICROSCOPIC (ARMC ONLY)
BACTERIA UA: NONE SEEN
Bilirubin Urine: NEGATIVE
GLUCOSE, UA: NEGATIVE mg/dL
Hgb urine dipstick: NEGATIVE
Ketones, ur: NEGATIVE mg/dL
Leukocytes, UA: NEGATIVE
Nitrite: NEGATIVE
Protein, ur: NEGATIVE mg/dL
Specific Gravity, Urine: 1.01 (ref 1.005–1.030)
pH: 6 (ref 5.0–8.0)

## 2015-11-15 LAB — CBC
HEMATOCRIT: 29.2 % — AB (ref 35.0–47.0)
Hemoglobin: 9.3 g/dL — ABNORMAL LOW (ref 12.0–16.0)
MCH: 25.7 pg — AB (ref 26.0–34.0)
MCHC: 31.7 g/dL — AB (ref 32.0–36.0)
MCV: 81.2 fL (ref 80.0–100.0)
Platelets: 244 10*3/uL (ref 150–440)
RBC: 3.6 MIL/uL — ABNORMAL LOW (ref 3.80–5.20)
RDW: 28.3 % — AB (ref 11.5–14.5)
WBC: 12.1 10*3/uL — ABNORMAL HIGH (ref 3.6–11.0)

## 2015-11-15 LAB — BASIC METABOLIC PANEL
Anion gap: 7 (ref 5–15)
BUN: 15 mg/dL (ref 6–20)
CHLORIDE: 95 mmol/L — AB (ref 101–111)
CO2: 35 mmol/L — ABNORMAL HIGH (ref 22–32)
Calcium: 8.7 mg/dL — ABNORMAL LOW (ref 8.9–10.3)
Creatinine, Ser: 0.86 mg/dL (ref 0.44–1.00)
GFR calc non Af Amer: >60 mL/min (ref >60)
GLUCOSE: 241 mg/dL — AB (ref 65–99)
POTASSIUM: 3.5 mmol/L (ref 3.5–5.1)
SODIUM: 137 mmol/L (ref 135–145)

## 2015-11-15 LAB — TROPONIN I
TROPONIN I: 0.03 ng/mL — AB (ref <0.03)
TROPONIN I: 0.03 ng/mL — AB (ref <0.03)

## 2015-11-15 MED ORDER — OXYCODONE-ACETAMINOPHEN 5-325 MG PO TABS
2.0000 | ORAL_TABLET | Freq: Once | ORAL | Status: AC
  Administered 2015-11-15: 2 via ORAL

## 2015-11-15 MED ORDER — OXYCODONE-ACETAMINOPHEN 5-325 MG PO TABS
ORAL_TABLET | ORAL | Status: AC
  Administered 2015-11-15: 2 via ORAL
  Filled 2015-11-15: qty 2

## 2015-11-15 MED ORDER — OXYCODONE-ACETAMINOPHEN 5-325 MG PO TABS: 2.0000 | ORAL_TABLET | Freq: Four times a day (QID) | ORAL | 0 refills | Status: DC | PRN

## 2015-11-15 NOTE — ED Notes (Signed)
Dr. Scotty Court notified of elevated troponin. No new orders at this time.

## 2015-11-15 NOTE — ED Provider Notes (Signed)
[facility] Emergency Department Provider Note        Time seen: ----------------------------------------- 2:36 PM on 11/15/2015 -----------------------------------------    I have reviewed the triage vital signs and the nursing notes.   HISTORY  Chief Complaint Fall and Dizziness    HPI [name] is a 70 y.o. female who presents to ER for dizziness that has been intermittent. Patient states she fell on Friday morning and injured her right side. She is complaining of right rib pain. Denies fevers, chills but does have some right-sided chest and rib pain. She denies shortness of breath, denies vomiting or diarrhea. Patient states she has scattered bruising over her body. Her blood sugars have also been labile. Nothing makes her symptoms better or worse.   Past Medical History:  Diagnosis Date  . Anemia   . Asthma   . Diabetes mellitus without complication (HCC)   . GERD (gastroesophageal reflux disease)   . Heart murmur   . History of hiatal hernia   . Hypertension   . Panic attacks   . Peripheral vascular disease (HCC)   . Restless leg syndrome   . Shortness of breath dyspnea     Patient Active Problem List   Diagnosis Date Noted  . B12 deficiency anemia 11/03/2015  . SOB (shortness of breath) on exertion 11/03/2015  . Head injury 10/29/2015  . GERD (gastroesophageal reflux disease) 10/17/2015  . Pure hypercholesterolemia 10/17/2015  . Type 2 diabetes mellitus (HCC) 10/05/2015  . Depression 10/05/2015  . Rash and nonspecific skin eruption 10/05/2015  . CHF (congestive heart failure) (HCC) 03/13/2015  . Carotid stenosis 07/24/2014    Past Surgical History:  Procedure Laterality Date  . ABDOMINAL HYSTERECTOMY    . BREAST BIOPSY Right yrs ago   benign  . ENDARTERECTOMY Right 07/24/2014   Procedure: ENDARTERECTOMY CAROTID;  Surgeon: Annice Needy, MD;  Location: [facility] ORS;  Service: Vascular;  Laterality: Right;  . EYE SURGERY Left    cataract  . FRACTURE SURGERY Left    fractured ankle  . TONSILLECTOMY      Allergies Codeine and Wheat bran  Social History Social History  Substance Use Topics  . Smoking status: Never Smoker  . Smokeless tobacco: Never Used  . Alcohol use No    Review of Systems Constitutional: Negative for fever. Cardiovascular: Positive for chest pain Respiratory: Negative for shortness of breath. Gastrointestinal: Negative for abdominal pain, vomiting and diarrhea. Genitourinary: Negative for dysuria. Musculoskeletal: Positive for right-sided chest and rib pain Skin: Negative for rash. Neurological: Negative for headaches, Positive for weakness  10-point ROS otherwise negative.  ____________________________________________   PHYSICAL EXAM:  VITAL SIGNS: ED Triage Vitals [11/15/15 1257]  Enc Vitals Group     BP 129/63     Pulse Rate 83     Resp 16     Temp 98.2 F (36.8 C)     Temp Source Oral     SpO2 94 %     Weight 252 lb (114.3 kg)     Height 5\' 2"  (1.575 m)     Head Circumference      Peak Flow      Pain Score      Pain Loc      Pain Edu?      Excl. in GC?     Constitutional: Alert and oriented. No acute distress Eyes: Conjunctivae are normal. PERRL. Normal extraocular movements. ENT   Head: Normocephalic, scattered facial contusions   Nose: No congestion/rhinnorhea.  Mouth/Throat: Mucous membranes are moist.   Neck: No stridor. Cardiovascular: Normal rate, regular rhythm. Systolic murmur is noted Respiratory: Normal respiratory effort without tachypnea nor retractions. Breath sounds are clear and equal bilaterally. No wheezes/rales/rhonchi. Gastrointestinal: Soft and nontender. Normal bowel sounds Musculoskeletal: Nontender with normal range of motion in all extremities. No lower extremity tenderness nor edema. Neurologic:  Normal speech and language. No gross focal neurologic deficits are appreciated.  Skin: Scattered contusions are appreciated  over the trunk and extremities. Psychiatric: Mood and affect are normal. Speech and behavior are normal.  ____________________________________________  EKG: Interpreted by me. Sinus rhythm rate 80 bpm, left axis deviation, LVH, nonspecific T-wave changes.  ____________________________________________  ED COURSE:  Pertinent labs & imaging results that were available during my care of the patient were reviewed by me and considered in my medical decision making (see chart for details). Clinical Course  Patient presents with nonspecific dizziness and recent fall. We will assess with basic labs and imaging.  Procedures ____________________________________________   LABS (pertinent positives/negatives)  Labs Reviewed  BASIC METABOLIC PANEL - Abnormal; Notable for the following:       Result Value   Chloride 95 (*)    CO2 35 (*)    Glucose, Bld 241 (*)    Calcium 8.7 (*)    All other components within normal limits  CBC - Abnormal; Notable for the following:    WBC 12.1 (*)    RBC 3.60 (*)    Hemoglobin 9.3 (*)    HCT 29.2 (*)    MCH 25.7 (*)    MCHC 31.7 (*)    RDW 28.3 (*)    All other components within normal limits  TROPONIN I - Abnormal; Notable for the following:    Troponin I 0.03 (*)    All other components within normal limits  URINALYSIS COMPLETEWITH MICROSCOPIC ([facility] ONLY)  TROPONIN I  CBG MONITORING, ED    RADIOLOGY Images were viewed by me  Rib x-rays  FINDINGS: Minimally displace fractures of 2 adjacent anterior RIGHT ribs (probable fourth and fifth ribs). No pneumothorax.  IMPRESSION: Fracture of adjacent anterior RIGHT ribs.  No pneumothorax .  ____________________________________________  FINAL ASSESSMENT AND PLAN  Dizziness, rib fracture  Plan: Patient with labs and imaging as dictated above. Patient is in no acute distress, has chronic anemia and has had intermittent elevated troponins in the past likely due to congestive heart failure.  She'll be given pain medication, encouraged to have close follow-up with her doctor for reevaluation.   Emily Filbert, MD   Note: This dictation was prepared with Dragon dictation. Any transcriptional errors that result from this process are unintentional    Emily Filbert, MD 11/15/15 1615

## 2015-11-15 NOTE — ED Triage Notes (Signed)
States felt dizzy on Friday morning and fell onto right side.  Fell onto floor.  C/O right rib pain

## 2015-11-16 ENCOUNTER — Telehealth: Payer: Self-pay | Admitting: Family

## 2015-11-16 NOTE — Telephone Encounter (Signed)
Please call patient-  If she is doing okay , she does not need to make a sooner appointment.   Thanks!

## 2015-11-16 NOTE — Telephone Encounter (Signed)
Spoke to caregiver. Sh e understands and has no questions, comments, or concerns.

## 2015-11-16 NOTE — Telephone Encounter (Signed)
Pt caregiver called to inform that pt went to ER/ Pt fell and broke ribs. Does pt need to come in for a follow up pt is already scheduled to come in on 11/30/15. Please advise?  Call @ [phone]. Thank you!

## 2015-11-16 NOTE — Telephone Encounter (Signed)
Please advise 

## 2015-11-24 NOTE — Telephone Encounter (Signed)
FYI.  Patient has scheduled appointment with Dr. Birdie Sons for [facility]. [id number] @1115 . Asked front to block 1130 due to time needed.

## 2015-11-24 NOTE — Telephone Encounter (Signed)
I meant to Send to brock. Thank you!

## 2015-11-24 NOTE — Telephone Encounter (Signed)
H/o falls.  Good GRF.   May do NSAIDs and make f/u appt.

## 2015-11-24 NOTE — Telephone Encounter (Signed)
Pt caregiver called about wanting to know if [name] will call something in for pain for the pt. Pt was not given enough pills pt ran out yesterday and without them she is in extreme pain and can hardly move and painful to breath in and out. Please advise?  Call caregiver @ [phone].Thank you!

## 2015-11-25 ENCOUNTER — Encounter: Payer: Self-pay | Admitting: Family Medicine

## 2015-11-25 ENCOUNTER — Ambulatory Visit (INDEPENDENT_AMBULATORY_CARE_PROVIDER_SITE_OTHER): Payer: Medicare Other | Admitting: Family Medicine

## 2015-11-25 DIAGNOSIS — S2249XA Multiple fractures of ribs, unspecified side, initial encounter for closed fracture: Secondary | ICD-10-CM | POA: Insufficient documentation

## 2015-11-25 DIAGNOSIS — S2241XD Multiple fractures of ribs, right side, subsequent encounter for fracture with routine healing: Secondary | ICD-10-CM

## 2015-11-25 DIAGNOSIS — Z23 Encounter for immunization: Secondary | ICD-10-CM | POA: Diagnosis not present

## 2015-11-25 MED ORDER — OXYCODONE-ACETAMINOPHEN 5-325 MG PO TABS: 1.0000 | ORAL_TABLET | Freq: Three times a day (TID) | ORAL | 0 refills | Status: DC | PRN

## 2015-11-25 NOTE — Telephone Encounter (Signed)
Noted  

## 2015-11-25 NOTE — Progress Notes (Signed)
  Marikay Alar, MD Phone: [phone]  [name] is a 70 y.o. female who presents today for follow-up.  Patient seen today for right rib pain. Was evaluated in the emergency room 10 days ago for a fall after getting dizzy while bending over to pull up her pants. She has had intermittent dizziness for a long time. None since then. This has been attributed to anemia which has been improving with iron supplementation. She did not pass out. She did not have a head injury. She was found to have 2 minimally displaced right rib fractures. She was discharged with oxycodone which was beneficial although she has run out of this. She continues to have some discomfort in her right ribs in the areas of the fractures that is worse with cough and deep breaths. No shortness of breath or fevers. No chest pain. No productive cough. They're checking her oxygen at home and it is consistently 96%. She's not had any further lightheaded episodes or falls.  PMH: nonsmoker.   ROS see history of present illness  Objective  Physical Exam Vitals:   11/25/15 1133  BP: 108/74  Pulse: 87  Temp: 98.3 F (36.8 C)    BP Readings from Last 3 Encounters:  11/25/15 108/74  11/15/15 (!) 154/69  11/03/15 118/61   Wt Readings from Last 3 Encounters:  11/25/15 262 lb 6 oz (119 kg)  11/15/15 252 lb (114.3 kg)  11/03/15 256 lb 12.8 oz (116.5 kg)    Physical Exam  Constitutional: No distress.  Cardiovascular: Normal rate and regular rhythm.   Murmur (2/6 systolic) heard. Pulmonary/Chest: Effort normal and breath sounds normal. No respiratory distress. She has no wheezes. She has no rales.  No flail chest evident, there is mild bruising over her right breast, she is tender over her right mid ribs anterolaterally, no breast tenderness  Neurological: She is alert.  CN 2-12 intact, 5/5 strength in bilateral biceps, triceps, grip, quads, hamstrings, plantar and dorsiflexion, sensation to light touch intact in  bilateral UE and LE  Skin: Skin is warm and dry. She is not diaphoretic.     Assessment/Plan: Please see individual problem list.  Fracture of multiple ribs Patient seen for fracture of right fourth and fifth ribs. No shortness of breath. No fevers. Benign lung exam. No evidence of flail chest. Vital signs stable. Continues to have issues with pain 10 days after initial evaluation in the ED. She would benefit from additional pain control with Percocet. This will be refilled today. I advised of return precautions. She will keep her follow-up for her intermittent dizziness with her PCP. Thus far she's had a fairly extensive workup of this issue and it is felt to be related to her anemia. Has not had any recurrence since ED evaluation. Advised to rise slowly. Given return precautions.   Orders Placed This Encounter  Procedures  . Flu vaccine HIGH DOSE PF    Meds ordered this encounter  Medications  . oxyCODONE-acetaminophen (PERCOCET) 5-325 MG tablet    Sig: Take 1 tablet by mouth every 8 (eight) hours as needed for moderate pain or severe pain.    Dispense:  30 tablet    Refill:  0   Marikay Alar, MD [facility]

## 2015-11-25 NOTE — Progress Notes (Signed)
Pre visit review using our clinic review tool, if applicable. No additional management support is needed unless otherwise documented below in the visit note. 

## 2015-11-25 NOTE — Telephone Encounter (Signed)
This is the patient you are seeing today

## 2015-11-25 NOTE — Patient Instructions (Signed)
Nice to meet you. We're going to refill your Percocet for your rib discomfort related to her rib fractures. If you develop shortness of breath, fevers, further dizziness, or any new or change in symptoms please seek medical attention immediately.

## 2015-11-25 NOTE — Assessment & Plan Note (Addendum)
Patient seen for fracture of right fourth and fifth ribs. No shortness of breath. No fevers. Benign lung exam. No evidence of flail chest. Vital signs stable. Continues to have issues with pain 10 days after initial evaluation in the ED. She would benefit from additional pain control with Percocet. This will be refilled today. I advised of return precautions. She will keep her follow-up for her intermittent dizziness with her PCP. Thus far she's had a fairly extensive workup of this issue and it is felt to be related to her anemia. Has not had any recurrence since ED evaluation. Advised to rise slowly. Given return precautions.

## 2015-11-30 ENCOUNTER — Ambulatory Visit (INDEPENDENT_AMBULATORY_CARE_PROVIDER_SITE_OTHER): Payer: Medicare Other | Admitting: Family

## 2015-11-30 ENCOUNTER — Encounter: Payer: Self-pay | Admitting: Family

## 2015-11-30 ENCOUNTER — Ambulatory Visit (INDEPENDENT_AMBULATORY_CARE_PROVIDER_SITE_OTHER): Payer: Medicare Other

## 2015-11-30 VITALS — BP 138/62 | HR 85 | Temp 97.9°F | Ht 62.0 in | Wt 267.2 lb

## 2015-11-30 DIAGNOSIS — E118 Type 2 diabetes mellitus with unspecified complications: Secondary | ICD-10-CM | POA: Diagnosis not present

## 2015-11-30 DIAGNOSIS — I509 Heart failure, unspecified: Secondary | ICD-10-CM

## 2015-11-30 DIAGNOSIS — R21 Rash and other nonspecific skin eruption: Secondary | ICD-10-CM

## 2015-11-30 DIAGNOSIS — D519 Vitamin B12 deficiency anemia, unspecified: Secondary | ICD-10-CM

## 2015-11-30 DIAGNOSIS — Z794 Long term (current) use of insulin: Secondary | ICD-10-CM

## 2015-11-30 NOTE — Assessment & Plan Note (Addendum)
Stable. CXR reflects chronic interstitial changes and stable mild cardiac enlargement. No crackles on exam. LE edema which acute on chronic. No evidence for acute, severe decompensation of CHF today. Ordered labs at visit which were not down. Patient has been called to come back and instructed to have labs done namely electrolytes, renal function, and pro BNP to access volume status. Currently on lasix 40mg  BID. Reassured that patient 12/07/2015 appt with Dr. 02/06/2016. Per chart review, 7/2017OV with  Dr. 11-07-1975 for CHF stated reasonably compensated with advice to continue conservative therapy. HTN - on HCTZ; consider ACE and beta blocker. Will discuss these ACE and beta blocker as well with patient. No recent echocardiogram in chart to review. Advised low sodium diet, daily weight, and elevation of legs. Once labs resulted will determine if we could increase lasix dose for patient comfort.

## 2015-11-30 NOTE — Assessment & Plan Note (Addendum)
Improved. Dizziness has also improved since last visit.  Slight increase in hemoglobin. Patient continues to take b12.

## 2015-11-30 NOTE — Progress Notes (Signed)
Subjective:    Patient ID: [name], female    DOB: 1945/10/20, 70 y.o.   MRN: [id number]  CC: [name] is a 70 y.o. female who presents today for follow up.   HPI: Patient here for follow up on chronic disease.   ED 9/17 s/p fall for dizziness. Dizziness has improved. No syncope. Pending appointment with Dr. Mechele Collin , GI for anemia 11/2015.  CHF- LE edema, chronic. Worsening past few days. Some improvement with elevation of legs. No open sores. Endorses chronic SOB with short distances and chronic orthopnea. No wheezing, cough, fever, chills. Has been taking lasix BID. Follows with Dr. Juliann Pares. 12/07/2015 appt with Dr. Juliann Pares.   7/2017OV with  Dr. Juliann Pares for CHF stated reasonably compensated with advice to continue conservative therapy. HTN - on HCTZ; consider ACE and beta blocker.  DM- BS averages fasting 120-150. Is doing SSI with meals. Lantus 10-15 units at bedtime. 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 Has just completed dapsone with improvement. Continues to use kenalog. Feels finally 'making progress'.                HISTORY:  Past Medical History:  Diagnosis Date  . Anemia   . Asthma   . CVA (cerebral vascular accident) (HCC)   . Diabetes mellitus without complication (HCC)   . GERD (gastroesophageal reflux disease)   . Heart murmur   . History of hiatal hernia   . Hypertension   . Panic attacks   . Peripheral vascular disease (HCC)   . Restless leg syndrome   . Shortness of breath dyspnea    Past Surgical History:  Procedure Laterality Date  . ABDOMINAL HYSTERECTOMY    . BREAST BIOPSY Right yrs ago   benign  . ENDARTERECTOMY Right 07/24/2014   Procedure: ENDARTERECTOMY CAROTID;  Surgeon: Annice Needy, MD;  Location: [facility] ORS;  Service: Vascular;  Laterality: Right;  . EYE SURGERY Left    cataract  . FRACTURE SURGERY Left    fractured ankle  .  TONSILLECTOMY     Family History  Problem Relation Age of Onset  . Pancreatic cancer Mother   . CAD Father   . Hypertension Father   . Pancreatic cancer Father   . CAD Brother   . Breast cancer Maternal Aunt     pt states several maternal aunts    Allergies: Codeine; Liraglutide; and Wheat bran Current Outpatient Prescriptions on File Prior to Visit  Medication Sig Dispense Refill  . aspirin EC 81 MG tablet Take 81 mg by mouth daily.    . cephALEXin (KEFLEX) 500 MG capsule   0  . cyanocobalamin (,VITAMIN B-12,) 1000 MCG/ML injection 1000 mcg (1 mg) injection once per week for four weeks, followed by 1000 mcg injection once per month. 1 mL 9  . ferrous sulfate 325 (65 FE) MG tablet Take 1 tablet (325 mg total) by mouth daily with breakfast. 30 tablet 3  . fluconazole (DIFLUCAN) 150 MG tablet Take 150 mg by mouth every Wednesday. For 3 weeks    . furosemide (LASIX) 40 MG tablet Take 1 tablet (40 mg total) by mouth 2 (two) times daily. 60 tablet 2  . gabapentin (NEURONTIN) 800 MG tablet Take 800 mg by mouth 2 (two) times daily.    Marland Kitchen glimepiride (AMARYL) 2 MG tablet Take 2 mg by mouth 2 (two) times daily.    Marland Kitchen  hydrochlorothiazide (HYDRODIURIL) 12.5 MG tablet Take 12.5 mg by mouth daily.    . insulin aspart (NOVOLOG) 100 UNIT/ML injection Inject into the skin as needed for high blood sugar.    . insulin glargine (LANTUS) 100 UNIT/ML injection Inject into the skin daily.    Marland Kitchen loratadine (CLARITIN) 10 MG tablet Take 10 mg by mouth daily.    Marland Kitchen lovastatin (MEVACOR) 20 MG tablet Take 20 mg by mouth at bedtime.    . metFORMIN (GLUCOPHAGE-XR) 500 MG 24 hr tablet Take 1,000 mg by mouth 2 (two) times daily.    . mupirocin cream (BACTROBAN) 2 % Apply 1 application topically 3 (three) times daily. 15 g 1  . mupirocin ointment (BACTROBAN) 2 % Place 1 application into the nose 2 (two) times daily. 22 g 0  . naproxen (NAPROSYN) 500 MG tablet Take 500 mg by mouth daily.    Marland Kitchen nystatin (MYCOSTATIN/NYSTOP)  powder Apply topically 2 (two) times daily.    Marland Kitchen omeprazole (PRILOSEC) 40 MG capsule Take 40 mg by mouth at bedtime.     Marland Kitchen oxyCODONE-acetaminophen (PERCOCET) 5-325 MG tablet Take 1 tablet by mouth every 8 (eight) hours as needed for moderate pain or severe pain. 30 tablet 0  . potassium chloride SA (K-DUR,KLOR-CON) 20 MEQ tablet Take 2 tablets (40 mEq total) by mouth daily. 30 tablet 0  . sertraline (ZOLOFT) 25 MG tablet Take 25 mg by mouth daily.    . traMADol (ULTRAM) 50 MG tablet   0  . triamcinolone cream (KENALOG) 0.1 % Apply topically 2 (two) times daily.     No current facility-administered medications on file prior to visit.     Social History  Substance Use Topics  . Smoking status: Never Smoker  . Smokeless tobacco: Never Used  . Alcohol use No    Review of Systems  Constitutional: Negative for chills and fever.  Respiratory: Positive for shortness of breath (occasional). Negative for cough and wheezing.   Cardiovascular: Positive for leg swelling. Negative for chest pain and palpitations.  Gastrointestinal: Negative for nausea and vomiting.      Objective:    BP 138/62   Pulse 85   Temp 97.9 F (36.6 C) (Oral)   Ht 5\' 2"  (1.575 m)   Wt 267 lb 3.2 oz (121.2 kg)   SpO2 93%   BMI 48.87 kg/m  BP Readings from Last 3 Encounters:  11/30/15 138/62  11/25/15 108/74  11/15/15 (!) 154/69   Wt Readings from Last 3 Encounters:  11/30/15 267 lb 3.2 oz (121.2 kg)  11/25/15 262 lb 6 oz (119 kg)  11/15/15 252 lb (114.3 kg)    Physical Exam  Constitutional: She appears well-developed and well-nourished.  Eyes: Conjunctivae are normal.  Cardiovascular: Normal rate, regular rhythm and normal pulses.   Murmur heard.  Systolic murmur is present with a grade of 3/6  SEM II/VI, Loudest LSB, non radiating, no thrill +2 pitting BLE edema. No palpable cords or masses. No erythema or increased warmth. No asymmetry in calf size when compared bilaterally LE hair growth symmetric  and present. No discoloration of varicosities noted. LE warm and palpable pedal pulses.   Pulmonary/Chest: Effort normal and breath sounds normal. She has no wheezes. She has no rhonchi. She has no rales.  Neurological: She is alert.  Skin: Skin is warm and dry.  Psychiatric: She has a normal mood and affect. Her speech is normal and behavior is normal. Thought content normal.  Vitals reviewed.  Assessment & Plan:   Problem List Items Addressed This Visit      Cardiovascular and Mediastinum   CHF (congestive heart failure) (HCC) - Primary    Stable. CXR reflects chronic interstitial changes and stable mild cardiac enlargement. No crackles on exam. LE edema which acute on chronic. No evidence for acute, severe decompensation of CHF today. Ordered labs at visit which were not down. Patient has been called to come back and instructed to have labs done namely electrolytes, renal function, and pro BNP to access volume status. Currently on lasix 40mg  BID. Reassured that patient 12/07/2015 appt with Dr. 02/06/2016. Per chart review, 7/2017OV with  Dr. 11-07-1975 for CHF stated reasonably compensated with advice to continue conservative therapy. HTN - on HCTZ; consider ACE and beta blocker. Will discuss these ACE and beta blocker as well with patient. No recent echocardiogram in chart to review. Advised low sodium diet, daily weight, and elevation of legs. Once labs resulted will determine if we could increase lasix dose for patient comfort.       Relevant Orders   CBC with Differential/Platelet   Basic metabolic panel   Pro b natriuretic peptide   DG Chest 2 View (Completed)     Endocrine   Type 2 diabetes mellitus (HCC)    a1c 9% one month ago. Will draw again in 2 months. Per discussion with patient, appears we are heading in the right direction. Congratulated patient on diligence and for resuming SSI with meals.         Musculoskeletal and Integument   Rash and nonspecific skin eruption      Stable, improving. 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 Very pleased that patient is finally making progress as I know this has been very frustrating for her. She continues to follow with [facility] dermatology and using kenalog currently.         Other   B12 deficiency anemia    Improved. Slight increase in hemoglobin. Patient continues to take b12.        Other Visit Diagnoses   None.      I am having Ms. Kutz maintain her glimepiride, hydrochlorothiazide, omeprazole, lovastatin, aspirin EC, metFORMIN, furosemide, potassium chloride SA, fluconazole, gabapentin, nystatin, sertraline, triamcinolone cream, naproxen, loratadine, insulin aspart, insulin glargine, mupirocin cream, traMADol, cephALEXin, ferrous sulfate, mupirocin ointment, cyanocobalamin, oxyCODONE-acetaminophen, and clobetasol ointment.   Meds ordered this encounter  Medications  . clobetasol ointment (TEMOVATE) 0.05 %    Sig: Apply to rash twice daily    Return precautions given.   Risks, benefits, and alternatives of the medications and treatment plan prescribed today were discussed, and patient expressed understanding.   Education regarding symptom management and diagnosis given to patient on AVS.  Continue to follow with [facility], FNP for routine health maintenance.   Rennie Plowman Whitis and I agreed with plan.   Scherrie Gerlach, FNP

## 2015-11-30 NOTE — Patient Instructions (Signed)
Low salt.   Elevate legs.  If worsening SOB, cough, edema, please back to ED. Concern for CHF exacerbation.    If there is no improvement in your symptoms, or if there is any worsening of symptoms, or if you have any additional concerns, please return for re-evaluation; or, if we are closed, consider going to the Emergency Room for evaluation if symptoms urgent.   Heart Failure Heart failure is a condition in which the heart has trouble pumping blood. This means your heart does not pump blood efficiently for your body to work well. In some cases of heart failure, fluid may back up into your lungs or you may have swelling (edema) in your lower legs. Heart failure is usually a long-term (chronic) condition. It is important for you to take good care of yourself and follow your health care provider's treatment plan. CAUSES  Some health conditions can cause heart failure. Those health conditions include:  High blood pressure (hypertension). Hypertension causes the heart muscle to work harder than normal. When pressure in the blood vessels is high, the heart needs to pump (contract) with more force in order to circulate blood throughout the body. High blood pressure eventually causes the heart to become stiff and weak.  Coronary artery disease (CAD). CAD is the buildup of cholesterol and fat (plaque) in the arteries of the heart. The blockage in the arteries deprives the heart muscle of oxygen and blood. This can cause chest pain and may lead to a heart attack. High blood pressure can also contribute to CAD.  Heart attack (myocardial infarction). A heart attack occurs when one or more arteries in the heart become blocked. The loss of oxygen damages the muscle tissue of the heart. When this happens, part of the heart muscle dies. The injured tissue does not contract as well and weakens the heart's ability to pump blood.  Abnormal heart valves. When the heart valves do not open and close properly, it  can cause heart failure. This makes the heart muscle pump harder to keep the blood flowing.  Heart muscle disease (cardiomyopathy or myocarditis). Heart muscle disease is damage to the heart muscle from a variety of causes. These can include drug or alcohol abuse, infections, or unknown reasons. These can increase the risk of heart failure.  Lung disease. Lung disease makes the heart work harder because the lungs do not work properly. This can cause a strain on the heart, leading it to fail.  Diabetes. Diabetes increases the risk of heart failure. High blood sugar contributes to high fat (lipid) levels in the blood. Diabetes can also cause slow damage to tiny blood vessels that carry important nutrients to the heart muscle. When the heart does not get enough oxygen and food, it can cause the heart to become weak and stiff. This leads to a heart that does not contract efficiently.  Other conditions can contribute to heart failure. These include abnormal heart rhythms, thyroid problems, and low blood counts (anemia). Certain unhealthy behaviors can increase the risk of heart failure, including:  Being overweight.  Smoking or chewing tobacco.  Eating foods high in fat and cholesterol.  Abusing illicit drugs or alcohol.  Lacking physical activity. SYMPTOMS  Heart failure symptoms may vary and can be hard to detect. Symptoms may include:  Shortness of breath with activity, such as climbing stairs.  Persistent cough.  Swelling of the feet, ankles, legs, or abdomen.  Unexplained weight gain.  Difficulty breathing when lying flat (orthopnea).  Waking from  sleep because of the need to sit up and get more air.  Rapid heartbeat.  Fatigue and loss of energy.  Feeling light-headed, dizzy, or close to fainting.  Loss of appetite.  Nausea.  Increased urination during the night (nocturia). DIAGNOSIS  A diagnosis of heart failure is based on your history, symptoms, physical examination,  and diagnostic tests. Diagnostic tests for heart failure may include:  Echocardiography.  Electrocardiography.  Chest X-ray.  Blood tests.  Exercise stress test.  Cardiac angiography.  Radionuclide scans. TREATMENT  Treatment is aimed at managing the symptoms of heart failure. Medicines, behavioral changes, or surgical intervention may be necessary to treat heart failure.  Medicines to help treat heart failure may include:  Angiotensin-converting enzyme (ACE) inhibitors. This type of medicine blocks the effects of a blood protein called angiotensin-converting enzyme. ACE inhibitors relax (dilate) the blood vessels and help lower blood pressure.  Angiotensin receptor blockers (ARBs). This type of medicine blocks the actions of a blood protein called angiotensin. Angiotensin receptor blockers dilate the blood vessels and help lower blood pressure.  Water pills (diuretics). Diuretics cause the kidneys to remove salt and water from the blood. The extra fluid is removed through urination. This loss of extra fluid lowers the volume of blood the heart pumps.  Beta blockers. These prevent the heart from beating too fast and improve heart muscle strength.  Digitalis. This increases the force of the heartbeat.  Healthy behavior changes include:  Obtaining and maintaining a healthy weight.  Stopping smoking or chewing tobacco.  Eating heart-healthy foods.  Limiting or avoiding alcohol.  Stopping illicit drug use.  Physical activity as directed by your health care provider.  Surgical treatment for heart failure may include:  A procedure to open blocked arteries, repair damaged heart valves, or remove damaged heart muscle tissue.  A pacemaker to improve heart muscle function and control certain abnormal heart rhythms.  An internal cardioverter defibrillator to treat certain serious abnormal heart rhythms.  A left ventricular assist device (LVAD) to assist the pumping ability of  the heart. HOME CARE INSTRUCTIONS   Take medicines only as directed by your health care provider. Medicines are important in reducing the workload of your heart, slowing the progression of heart failure, and improving your symptoms.  Do not stop taking your medicine unless directed by your health care provider.  Do not skip any dose of medicine.  Refill your prescriptions before you run out of medicine. Your medicines are needed every day.  Engage in moderate physical activity if directed by your health care provider. Moderate physical activity can benefit some people. The elderly and people with severe heart failure should consult with a health care provider for physical activity recommendations.  Eat heart-healthy foods. Food choices should be free of trans fat and low in saturated fat, cholesterol, and salt (sodium). Healthy choices include fresh or frozen fruits and vegetables, fish, lean meats, legumes, fat-free or low-fat dairy products, and whole grain or high fiber foods. Talk to a dietitian to learn more about heart-healthy foods.  Limit sodium if directed by your health care provider. Sodium restriction may reduce symptoms of heart failure in some people. Talk to a dietitian to learn more about heart-healthy seasonings.  Use healthy cooking methods. Healthy cooking methods include roasting, grilling, broiling, baking, poaching, steaming, or stir-frying. Talk to a dietitian to learn more about healthy cooking methods.  Limit fluids if directed by your health care provider. Fluid restriction may reduce symptoms of heart failure in some  people.  Weigh yourself every day. Daily weights are important in the early recognition of excess fluid. You should weigh yourself every morning after you urinate and before you eat breakfast. Wear the same amount of clothing each time you weigh yourself. Record your daily weight. Provide your health care provider with your weight record.  Monitor and  record your blood pressure if directed by your health care provider.  Check your pulse if directed by your health care provider.  Lose weight if directed by your health care provider. Weight loss may reduce symptoms of heart failure in some people.  Stop smoking or chewing tobacco. Nicotine makes your heart work harder by causing your blood vessels to constrict. Do not use nicotine gum or patches before talking to your health care provider.  Keep all follow-up visits as directed by your health care provider. This is important.  Limit alcohol intake to no more than 1 drink per day for nonpregnant women and 2 drinks per day for men. One drink equals 12 ounces of beer, 5 ounces of wine, or 1 ounces of hard liquor. Drinking more than that is harmful to your heart. Tell your health care provider if you drink alcohol several times a week. Talk with your health care provider about whether alcohol is safe for you. If your heart has already been damaged by alcohol or you have severe heart failure, drinking alcohol should be stopped completely.  Stop illicit drug use.  Stay up-to-date with immunizations. It is especially important to prevent respiratory infections through current pneumococcal and influenza immunizations.  Manage other health conditions such as hypertension, diabetes, thyroid disease, or abnormal heart rhythms as directed by your health care provider.  Learn to manage stress.  Plan rest periods when fatigued.  Learn strategies to manage high temperatures. If the weather is extremely hot:  Avoid vigorous physical activity.  Use air conditioning or fans or seek a cooler location.  Avoid caffeine and alcohol.  Wear loose-fitting, lightweight, and light-colored clothing.  Learn strategies to manage cold temperatures. If the weather is extremely cold:  Avoid vigorous physical activity.  Layer clothes.  Wear mittens or gloves, a hat, and a scarf when going outside.  Avoid  alcohol.  Obtain ongoing education and support as needed.  Participate in or seek rehabilitation as needed to maintain or improve independence and quality of life. SEEK MEDICAL CARE IF:   You have a rapid weight gain.  You have increasing shortness of breath that is unusual for you.  You are unable to participate in your usual physical activities.  You tire easily.  You cough more than normal, especially with physical activity.  You have any or more swelling in areas such as your hands, feet, ankles, or abdomen.  You are unable to sleep because it is hard to breathe.  You feel like your heart is beating fast (palpitations).  You become dizzy or light-headed upon standing up. SEEK IMMEDIATE MEDICAL CARE IF:   You have difficulty breathing.  There is a change in mental status such as decreased alertness or difficulty with concentration.  You have a pain or discomfort in your chest.  You have an episode of fainting (syncope). MAKE SURE YOU:   Understand these instructions.  Will watch your condition.  Will get help right away if you are not doing well or get worse.   This information is not intended to replace advice given to you by your health care provider. Make sure you discuss any questions  you have with your health care provider.   Document Released: 02/14/2005 Document Revised: 07/01/2014 Document Reviewed: 03/16/2012 Elsevier Interactive Patient Education [employer].

## 2015-12-01 ENCOUNTER — Telehealth: Payer: Self-pay | Admitting: Family

## 2015-12-01 DIAGNOSIS — M7989 Other specified soft tissue disorders: Secondary | ICD-10-CM

## 2015-12-01 NOTE — Assessment & Plan Note (Signed)
Stable, improving. 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 Very pleased that patient is finally making progress as I know this has been very frustrating for her. She continues to follow with [facility] dermatology and using kenalog currently.

## 2015-12-01 NOTE — Telephone Encounter (Signed)
Patient had labs drawn from [facility] on 12/01/15. She requested to call [phone] to collaborate if labs Sena Hitch ordered are the same as [facility]  Pt contact  [phone]

## 2015-12-01 NOTE — Telephone Encounter (Signed)
Please call patient-  I had ordered labs to go with chest xray and appears patient left yesterday without having them done.  Would you call and see if they could come by?

## 2015-12-01 NOTE — Assessment & Plan Note (Signed)
a1c 9% one month ago. Will draw again in 2 months. Per discussion with patient, appears we are heading in the right direction. Congratulated patient on diligence and for resuming SSI with meals.

## 2015-12-01 NOTE — Telephone Encounter (Signed)
Left message for patient to return call back.  

## 2015-12-03 ENCOUNTER — Telehealth: Payer: Self-pay | Admitting: *Deleted

## 2015-12-03 MED ORDER — FUROSEMIDE 40 MG PO TABS: 40.0000 mg | ORAL_TABLET | Freq: Two times a day (BID) | ORAL | 0 refills | Status: DC

## 2015-12-03 NOTE — Telephone Encounter (Signed)
Spoken to care taker. Patient will be coming in for labs tomorrow after doctors appointment @1300 .

## 2015-12-03 NOTE — Telephone Encounter (Signed)
Medication has been refilled.

## 2015-12-03 NOTE — Telephone Encounter (Signed)
Pt requested a medication refill for furosemide Pharmacy [facility] on [address]

## 2015-12-04 ENCOUNTER — Ambulatory Visit (INDEPENDENT_AMBULATORY_CARE_PROVIDER_SITE_OTHER): Payer: Medicare Other

## 2015-12-04 ENCOUNTER — Ambulatory Visit (INDEPENDENT_AMBULATORY_CARE_PROVIDER_SITE_OTHER): Payer: Medicare Other | Admitting: Vascular Surgery

## 2015-12-04 ENCOUNTER — Other Ambulatory Visit (INDEPENDENT_AMBULATORY_CARE_PROVIDER_SITE_OTHER): Payer: Self-pay | Admitting: Vascular Surgery

## 2015-12-04 ENCOUNTER — Other Ambulatory Visit (INDEPENDENT_AMBULATORY_CARE_PROVIDER_SITE_OTHER): Payer: Medicare Other

## 2015-12-04 VITALS — BP 128/68 | HR 94 | Resp 16 | Ht 62.0 in | Wt 260.0 lb

## 2015-12-04 DIAGNOSIS — I6523 Occlusion and stenosis of bilateral carotid arteries: Secondary | ICD-10-CM | POA: Diagnosis not present

## 2015-12-04 DIAGNOSIS — E7800 Pure hypercholesterolemia, unspecified: Secondary | ICD-10-CM | POA: Diagnosis not present

## 2015-12-04 DIAGNOSIS — D649 Anemia, unspecified: Secondary | ICD-10-CM

## 2015-12-04 DIAGNOSIS — M7989 Other specified soft tissue disorders: Secondary | ICD-10-CM | POA: Diagnosis not present

## 2015-12-04 DIAGNOSIS — Z794 Long term (current) use of insulin: Secondary | ICD-10-CM

## 2015-12-04 DIAGNOSIS — E118 Type 2 diabetes mellitus with unspecified complications: Secondary | ICD-10-CM

## 2015-12-04 LAB — CBC WITH DIFFERENTIAL/PLATELET
BASOS ABS: 0 10*3/uL (ref 0.0–0.1)
Basophils Relative: 0.4 % (ref 0.0–3.0)
EOS ABS: 0.3 10*3/uL (ref 0.0–0.7)
Eosinophils Relative: 3 % (ref 0.0–5.0)
HEMATOCRIT: 31.1 % — AB (ref 36.0–46.0)
HEMOGLOBIN: 9.9 g/dL — AB (ref 12.0–15.0)
LYMPHS PCT: 15.6 % (ref 12.0–46.0)
Lymphs Abs: 1.5 10*3/uL (ref 0.7–4.0)
MCHC: 31.8 g/dL (ref 30.0–36.0)
MCV: 81.6 fl (ref 78.0–100.0)
MONO ABS: 0.5 10*3/uL (ref 0.1–1.0)
Monocytes Relative: 5.2 % (ref 3.0–12.0)
Neutro Abs: 7.5 10*3/uL (ref 1.4–7.7)
Neutrophils Relative %: 75.8 % (ref 43.0–77.0)
Platelets: 244 10*3/uL (ref 150.0–400.0)
RBC: 3.81 Mil/uL — ABNORMAL LOW (ref 3.87–5.11)
RDW: 24.3 % — AB (ref 11.5–15.5)
WBC: 9.9 10*3/uL (ref 4.0–10.5)

## 2015-12-04 LAB — RETICULOCYTES
ABS Retic: 94250 cells/uL — ABNORMAL HIGH (ref 20000–80000)
RBC.: 3.77 MIL/uL — AB (ref 3.80–5.10)
RETIC CT PCT: 2.5 %

## 2015-12-04 LAB — COMPREHENSIVE METABOLIC PANEL
ALBUMIN: 2.9 g/dL — AB (ref 3.5–5.2)
ALT: 23 U/L (ref 0–35)
AST: 42 U/L — AB (ref 0–37)
Alkaline Phosphatase: 165 U/L — ABNORMAL HIGH (ref 39–117)
BILIRUBIN TOTAL: 0.5 mg/dL (ref 0.2–1.2)
BUN: 12 mg/dL (ref 6–23)
CALCIUM: 9.4 mg/dL (ref 8.4–10.5)
CHLORIDE: 88 meq/L — AB (ref 96–112)
CO2: 38 meq/L — AB (ref 19–32)
CREATININE: 0.9 mg/dL (ref 0.40–1.20)
GFR: 65.82 mL/min (ref >60.00)
Glucose, Bld: 161 mg/dL — ABNORMAL HIGH (ref 70–99)
Potassium: 3.4 mEq/L — ABNORMAL LOW (ref 3.5–5.1)
SODIUM: 138 meq/L (ref 135–145)
Total Protein: 7.4 g/dL (ref 6.0–8.3)

## 2015-12-04 LAB — HEMOGLOBIN A1C: HEMOGLOBIN A1C: 7 % — AB (ref 4.6–6.5)

## 2015-12-04 NOTE — Addendum Note (Signed)
Addended by: Warden Fillers on: 12/04/2015 03:09 PM   Modules accepted: Orders

## 2015-12-04 NOTE — Assessment & Plan Note (Signed)
blood glucose control important in reducing the progression of atherosclerotic disease. Also, involved in wound healing. On appropriate medications.  

## 2015-12-04 NOTE — Assessment & Plan Note (Signed)
The patient is about a year status post right carotid endarterectomy. Her duplex today shows a widely patent right carotid endarterectomy and stenosis that falls in the 1-39% range on the left is an improvement from previous studies. She has no new symptoms. Continue aspirin and statin agent. Plan to recheck in 1 year.

## 2015-12-04 NOTE — Progress Notes (Signed)
MRN : [id number]  [name] is a 70 y.o. (May 13, 1945) female who presents with chief complaint of  Chief Complaint  Patient presents with  . Follow-up    ultrasound  .  History of Present Illness: The patient returns in follow-up of her carotid disease.The patient is about a year status post right carotid endarterectomy. Her duplex today shows a widely patent right carotid endarterectomy and stenosis that falls in the 1-39% range on the left is an improvement from previous studies. She has no new symptoms. Specifically, the patient denies amaurosis fugax, speech or swallowing difficulties, or arm or leg weakness or numbness   Current Outpatient Prescriptions  Medication Sig Dispense Refill  . aspirin EC 81 MG tablet Take 81 mg by mouth daily.    . cephALEXin (KEFLEX) 500 MG capsule   0  . clobetasol ointment (TEMOVATE) 0.05 % Apply to rash twice daily    . cyanocobalamin (,VITAMIN B-12,) 1000 MCG/ML injection 1000 mcg (1 mg) injection once per week for four weeks, followed by 1000 mcg injection once per month. 1 mL 9  . ferrous sulfate 325 (65 FE) MG tablet Take 1 tablet (325 mg total) by mouth daily with breakfast. 30 tablet 3  . fluconazole (DIFLUCAN) 150 MG tablet Take 150 mg by mouth every Wednesday. For 3 weeks    . furosemide (LASIX) 40 MG tablet Take 1 tablet (40 mg total) by mouth 2 (two) times daily. 60 tablet 0  . gabapentin (NEURONTIN) 800 MG tablet Take 800 mg by mouth 2 (two) times daily.    Marland Kitchen glimepiride (AMARYL) 2 MG tablet Take 2 mg by mouth 2 (two) times daily.    . hydrochlorothiazide (HYDRODIURIL) 12.5 MG tablet Take 12.5 mg by mouth daily.    . insulin aspart (NOVOLOG) 100 UNIT/ML injection Inject into the skin as needed for high blood sugar.    . insulin glargine (LANTUS) 100 UNIT/ML injection Inject into the skin daily.    Marland Kitchen loratadine (CLARITIN) 10 MG tablet Take 10 mg by mouth daily.    Marland Kitchen lovastatin (MEVACOR) 20 MG tablet Take 20 mg by mouth at bedtime.      . metFORMIN (GLUCOPHAGE-XR) 500 MG 24 hr tablet Take 1,000 mg by mouth 2 (two) times daily.    . mupirocin cream (BACTROBAN) 2 % Apply 1 application topically 3 (three) times daily. 15 g 1  . mupirocin ointment (BACTROBAN) 2 % Place 1 application into the nose 2 (two) times daily. 22 g 0  . naproxen (NAPROSYN) 500 MG tablet Take 500 mg by mouth daily.    Marland Kitchen nystatin (MYCOSTATIN/NYSTOP) powder Apply topically 2 (two) times daily.    Marland Kitchen omeprazole (PRILOSEC) 40 MG capsule Take 40 mg by mouth at bedtime.     Marland Kitchen oxyCODONE-acetaminophen (PERCOCET) 5-325 MG tablet Take 1 tablet by mouth every 8 (eight) hours as needed for moderate pain or severe pain. 30 tablet 0  . potassium chloride SA (K-DUR,KLOR-CON) 20 MEQ tablet Take 2 tablets (40 mEq total) by mouth daily. 30 tablet 0  . sertraline (ZOLOFT) 25 MG tablet Take 25 mg by mouth daily.    . traMADol (ULTRAM) 50 MG tablet   0  . triamcinolone cream (KENALOG) 0.1 % Apply topically 2 (two) times daily.     No current facility-administered medications for this visit.     Past Medical History:  Diagnosis Date  . Anemia   . Asthma   . CVA (cerebral vascular accident) (HCC)   .  Diabetes mellitus without complication (HCC)   . GERD (gastroesophageal reflux disease)   . Heart murmur   . History of hiatal hernia   . Hypertension   . Panic attacks   . Peripheral vascular disease (HCC)   . Restless leg syndrome   . Shortness of breath dyspnea     Past Surgical History:  Procedure Laterality Date  . ABDOMINAL HYSTERECTOMY    . BREAST BIOPSY Right yrs ago   benign  . ENDARTERECTOMY Right 07/24/2014   Procedure: ENDARTERECTOMY CAROTID;  Surgeon: Annice Needy, MD;  Location: [facility] ORS;  Service: Vascular;  Laterality: Right;  . EYE SURGERY Left    cataract  . FRACTURE SURGERY Left    fractured ankle  . TONSILLECTOMY      Social History Social History  Substance Use Topics  . Smoking status: Never Smoker  . Smokeless tobacco: Never Used  .  Alcohol use No     Family History Family History  Problem Relation Age of Onset  . Pancreatic cancer Mother   . CAD Father   . Hypertension Father   . Pancreatic cancer Father   . CAD Brother   . Breast cancer Maternal Aunt     pt states several maternal aunts     Allergies  Allergen Reactions  . Codeine Other (See Comments)    Reaction:  Dizziness   . Liraglutide     Other reaction(s): nausea and stomach pain  . Wheat Bran Other (See Comments)    Reaction:  Sneezing      REVIEW OF SYSTEMS (Negative unless checked)  Constitutional: [] Weight loss  [] Fever  [] Chills Cardiac: [] Chest pain   [] Chest pressure   [] Palpitations   [] Shortness of breath when laying flat   [] Shortness of breath at rest   [x] Shortness of breath with exertion. Vascular:  [] Pain in legs with walking   [] Pain in legs at rest   [] Pain in legs when laying flat   [] Claudication   [] Pain in feet when walking  [] Pain in feet at rest  [] Pain in feet when laying flat   [] History of DVT   [] Phlebitis   [x] Swelling in legs   [] Varicose veins   [] Non-healing ulcers Pulmonary:   [] Uses home oxygen   [] Productive cough   [] Hemoptysis   [] Wheeze  [] COPD   [] Asthma Neurologic:  [] Dizziness  [] Blackouts   [] Seizures   [] History of stroke   [] History of TIA  [] Aphasia   [] Temporary blindness   [] Dysphagia   [] Weakness or numbness in arms   [] Weakness or numbness in legs Musculoskeletal:  [] Arthritis   [] Joint swelling   [x] Joint pain   [] Low back pain Hematologic:  [] Easy bruising  [] Easy bleeding   [] Hypercoagulable state   [] Anemic  [] Hepatitis Gastrointestinal:  [] Blood in stool   [] Vomiting blood  [] Gastroesophageal reflux/heartburn   [] Difficulty swallowing. Genitourinary:  [] Chronic kidney disease   [] Difficult urination  [] Frequent urination  [] Burning with urination   [] Blood in urine Skin:  [] Rashes   [] Ulcers   [] Wounds Psychological:  [] History of anxiety   []  History of major depression.  Physical  Examination  Vitals:   12/04/15 1355 12/04/15 1356  BP: (!) 141/71 128/68  Pulse: 94   Resp: 16   Weight: 260 lb (117.9 kg)   Height: 5\' 2"  (1.575 m)    Body mass index is 47.55 kg/m. Gen:  WD/WN, Morbidly obese Head: [place]/AT, No temporalis wasting. Ear/Nose/Throat: Hearing grossly intact, nares w/o erythema or drainage, trachea midline Eyes:  PERRLA, EOMI. Sclera non-icteric Neck: Supple, no nuchal rigidity.  No bruit or JVD.  Pulmonary:  Good air movement, equal and clear to auscultation bilaterally.  Cardiac: RRR, normal S1, S2, no Murmurs, rubs or gallops. Vascular:  Vessel Right Left  Radial Palpable Palpable  Ulnar Palpable Palpable  Brachial Palpable Palpable  Carotid Palpable, without bruit Palpable, without bruit  Aorta Not palpable N/A  Femoral Palpable Palpable  Popliteal Palpable Palpable  PT Palpable Palpable  DP Palpable Palpable   Gastrointestinal: soft, non-tender/non-distended. No guarding/reflex.  Musculoskeletal: M/S 5/5 throughout.  No deformity or atrophy. 1+ bilateral lower extremity edema. Uses a walker to ambulate Neurologic: CN 2-12 intact. Pain and light touch intact in extremities.  Symmetrical.  Speech is fluent. Motor exam as listed above. Psychiatric: Judgment intact, Mood & affect appropriate for pt's clinical situation. Dermatologic: No rashes or ulcers noted.  No cellulitis or open wounds. Lymph : No Cervical, Axillary, or Inguinal lymphadenopathy.     CBC Lab Results  Component Value Date   WBC 12.1 (H) 11/15/2015   HGB 9.3 (L) 11/15/2015   HCT 29.2 (L) 11/15/2015   MCV 81.2 11/15/2015   PLT 244 11/15/2015    BMET    Component Value Date/Time   NA 137 11/15/2015 1303   NA 139 12/19/2013 1903   K 3.5 11/15/2015 1303   K 4.1 12/19/2013 1903   CL 95 (L) 11/15/2015 1303   CL 101 12/19/2013 1903   CO2 35 (H) 11/15/2015 1303   CO2 29 12/19/2013 1903   GLUCOSE 241 (H) 11/15/2015 1303   GLUCOSE 198 (H) 12/19/2013 1903   BUN 15  11/15/2015 1303   BUN 9 12/19/2013 1903   CREATININE 0.86 11/15/2015 1303   CREATININE 0.73 12/19/2013 1903   CALCIUM 8.7 (L) 11/15/2015 1303   CALCIUM 8.6 12/19/2013 1903   GFRNONAA >60 11/15/2015 1303   GFRNONAA >60 12/19/2013 1903   GFRAA >60 11/15/2015 1303   GFRAA >60 12/19/2013 1903   Estimated Creatinine Clearance: 75.2 mL/min (by C-G formula based on SCr of 0.86 mg/dL).  COAG Lab Results  Component Value Date   INR 1.03 07/17/2014    Radiology Dg Chest 2 View  Result Date: 11/30/2015 CLINICAL DATA:  Lower extremity edema and history of CHF. EXAM: CHEST  2 VIEW COMPARISON:  11/15/2015 FINDINGS: Stable mild cardiac enlargement. Stable chronic interstitial and pulmonary vascular prominence which may reflect chronic interstitial edema. There is no evidence of focal airspace consolidation, pneumothorax, nodule or pleural fluid. Bony structures are unremarkable. IMPRESSION: Suspect component of chronic interstitial edema. Electronically Signed   By: Irish Lack M.D.   On: 11/30/2015 17:10   Dg Chest 2 View  Result Date: 11/15/2015 CLINICAL DATA:  States felt dizzy on Friday morning and fell onto right side. Fell onto floor. C/O right rib pain anteriorly and radiates posteriorly. Hx of asthma, diabetes, heart murmur, and HTN. Nonsmoker. Hx of right breast biopsy EXAM: CHEST  2 VIEW COMPARISON:  10/29/2015 FINDINGS: Normal mediastinum and cardiac silhouette. Chronic central bronchitic markings. Normal pulmonary vasculature. No effusion, infiltrate, or pneumothorax. IMPRESSION: Chronic bronchitic markings. No clear acute cardiopulmonary process. Electronically Signed   By: Genevive Bi M.D.   On: 11/15/2015 14:01   Dg Ribs Unilateral Right  Result Date: 11/15/2015 CLINICAL DATA:  Recent fall .  RIGHT-sided pain. EXAM: RIGHT RIBS - 2 VIEW COMPARISON:  None. FINDINGS: Minimally displace fractures of 2 adjacent anterior RIGHT ribs (probable fourth and fifth ribs). No pneumothorax.  IMPRESSION: Fracture of  adjacent anterior RIGHT ribs.  No pneumothorax . Electronically Signed   By: Genevive Bi M.D.   On: 11/15/2015 15:12      Assessment/Plan Pure hypercholesterolemia lipid control important in reducing the progression of atherosclerotic disease. Continue statin therapy   Type 2 diabetes mellitus (HCC) blood glucose control important in reducing the progression of atherosclerotic disease. Also, involved in wound healing. On appropriate medications.   Carotid stenosis The patient is about a year status post right carotid endarterectomy. Her duplex today shows a widely patent right carotid endarterectomy and stenosis that falls in the 1-39% range on the left is an improvement from previous studies. She has no new symptoms. Continue aspirin and statin agent. Plan to recheck in 1 year.    Festus Barren, MD  12/04/2015 2:22 PM    This note was created with Dragon medical transcription system.  Any errors from dictation are purely unintentional

## 2015-12-04 NOTE — Assessment & Plan Note (Signed)
lipid control important in reducing the progression of atherosclerotic disease. Continue statin therapy  

## 2015-12-04 NOTE — Patient Instructions (Signed)
°Carotid Artery Disease °The carotid arteries are the two main arteries on either side of the neck that supply blood to the brain. Carotid artery disease, also called carotid artery stenosis, is the narrowing or blockage of one or both carotid arteries. Carotid artery disease increases your risk for a stroke or a transient ischemic attack (TIA). A TIA is an episode in which a waxy, fatty substance that accumulates within the artery (plaque) blocks blood flow to the brain. A TIA is considered a "warning stroke."  °CAUSES  °· Buildup of plaque inside the carotid arteries (atherosclerosis) (common). °· A weakened outpouching in an artery (aneurysm). °· Inflammation of the carotid artery (arteritis). °· A fibrous growth within the carotid artery (fibromuscular dysplasia). °· Tissue death within the carotid artery due to radiation treatment (post-radiation necrosis). °· Decreased blood flow due to spasms of the carotid artery (vasospasm). °· Separation of the walls of the carotid artery (carotid dissection). °RISK FACTORS °· High cholesterol (dyslipidemia).   °· High blood pressure (hypertension).   °· Smoking.   °· Obesity.   °· Diabetes.   °· Family history of cardiovascular disease.   °· Inactivity or lack of regular exercise.   °· Being female. Men have an increased risk of developing atherosclerosis earlier in life than women.   °SYMPTOMS  °Carotid artery disease does not cause symptoms. °DIAGNOSIS °Diagnosis of carotid artery disease may include:  °· A physical exam. Your health care provider may hear an abnormal sound (bruit) when listening to the carotid arteries.   °· Specific tests that look at the blood flow in the carotid arteries. These tests include:   °¨ Carotid artery ultrasonography.   °¨ Carotid or cerebral angiography.   °¨ Computerized tomographic angiography (CTA).   °¨ Magnetic resonance angiography (MRA).   °TREATMENT  °Treatment of carotid artery disease can include a combination of treatments.  Treatment options include: °· Surgery. You may have:   °¨ A carotid endarterectomy. This is a surgery to remove the blockages in the carotid arteries.   °¨ A carotid angioplasty with stenting. This is a nonsurgical interventional procedure. A wire mesh (stent) is used to widen the blocked carotid arteries.   °· Medicines to control blood pressure, cholesterol, and reduce blood clotting (antiplatelet therapy).   °· Adjusting your diet.   °· Lifestyle changes such as:   °¨ Quitting smoking.   °¨ Exercising as tolerated or as directed by your health care provider.   °¨ Controlling and maintaining a good blood pressure.   °¨ Keeping cholesterol levels under control.   °HOME CARE INSTRUCTIONS  °· Take medicines only as directed by your health care provider. Make sure you understand all your medicine instructions. Do not stop your medicines without talking to your health care provider.   °· Follow your health care provider's diet instructions. It is important to eat a healthy diet that is low in saturated fats and includes plenty of fresh fruits, vegetables, and lean meats. High-fat, high-sodium foods as well as foods that are fried, overly processed, or have poor nutritional value should be avoided. °· Maintain a healthy weight.   °· Stay physically active. It is recommended that you get at least 30 minutes of activity every day.   °· Do not use any tobacco products including cigarettes, chewing tobacco, or electronic cigarettes. If you need help quitting, ask your health care provider. °· Limit alcohol use to:   °¨ No more than 2 drinks per day for men.   °¨ No more than 1 drink per day for nonpregnant women.   °· Do not use illegal drugs.   °· Keep all follow-up visits as directed by your health   care provider.   °SEEK IMMEDIATE MEDICAL CARE IF:  °You develop TIA or stroke symptoms. These include:  °· Sudden weakness or numbness on one side of the body, such as in the face, arm, or leg.   °· Sudden confusion.    °· Trouble speaking (aphasia) or understanding.   °· Sudden trouble seeing out of one or both eyes.   °· Sudden trouble walking.   °· Dizziness or feeling like you might faint.   °· Loss of balance or coordination.   °· Sudden severe headache with no known cause.   °· Sudden trouble swallowing (dysphagia).   °If you have any of these symptoms, call your local emergency services (911 in [place] Do not drive yourself to the clinic or hospital. This is a medical emergency.  °  °This information is not intended to replace advice given to you by your health care provider. Make sure you discuss any questions you have with your health care provider. °  °Document Released: 05/09/2011 Document Revised: 03/07/2014 Document Reviewed: 08/15/2012 °Elsevier Interactive Patient Education ©2016 [employer] ° °

## 2015-12-09 ENCOUNTER — Telehealth: Payer: Self-pay | Admitting: Family

## 2015-12-09 DIAGNOSIS — I509 Heart failure, unspecified: Secondary | ICD-10-CM

## 2015-12-09 LAB — ERYTHROPOIETIN: Erythropoietin: 77.8 m[IU]/mL — ABNORMAL HIGH (ref 2.6–18.5)

## 2015-12-09 NOTE — Telephone Encounter (Signed)
Pt caregiver Alona Bene called back regarding the pt being weak and has been falling down, and possibly having some lab work done. Pt also needs furosemide (LASIX) 40 MG tablet to be refilled. Please advise, thank you!  Pharmacy - [facility] [place], [place] - [address]  Call pt @ [phone]

## 2015-12-09 NOTE — Telephone Encounter (Signed)
Patient has been notified

## 2015-12-09 NOTE — Telephone Encounter (Signed)
Pt's caregiver Alona Bene called regarding lab results. Thank you!  Call [phone]

## 2015-12-10 ENCOUNTER — Inpatient Hospital Stay
Admission: EM | Admit: 2015-12-10 | Discharge: 2015-12-14 | DRG: 291 | Disposition: A | Discharge status: Skilled Nursing Facility | Payer: Medicare Other | Attending: Internal Medicine | Admitting: Internal Medicine

## 2015-12-10 ENCOUNTER — Emergency Department: Payer: Medicare Other

## 2015-12-10 ENCOUNTER — Encounter: Payer: Self-pay | Admitting: Emergency Medicine

## 2015-12-10 ENCOUNTER — Other Ambulatory Visit: Payer: Self-pay

## 2015-12-10 ENCOUNTER — Encounter: Payer: Self-pay | Admitting: Family

## 2015-12-10 ENCOUNTER — Other Ambulatory Visit: Payer: Self-pay | Admitting: Family

## 2015-12-10 DIAGNOSIS — J9601 Acute respiratory failure with hypoxia: Secondary | ICD-10-CM | POA: Diagnosis present

## 2015-12-10 DIAGNOSIS — Z6841 Body Mass Index (BMI) 40.0 and over, adult: Secondary | ICD-10-CM | POA: Diagnosis not present

## 2015-12-10 DIAGNOSIS — E119 Type 2 diabetes mellitus without complications: Secondary | ICD-10-CM | POA: Diagnosis present

## 2015-12-10 DIAGNOSIS — E876 Hypokalemia: Secondary | ICD-10-CM | POA: Diagnosis present

## 2015-12-10 DIAGNOSIS — I5043 Acute on chronic combined systolic (congestive) and diastolic (congestive) heart failure: Secondary | ICD-10-CM | POA: Diagnosis present

## 2015-12-10 DIAGNOSIS — W19XXXA Unspecified fall, initial encounter: Secondary | ICD-10-CM | POA: Diagnosis present

## 2015-12-10 DIAGNOSIS — S0990XA Unspecified injury of head, initial encounter: Secondary | ICD-10-CM | POA: Diagnosis present

## 2015-12-10 DIAGNOSIS — Z8673 Personal history of transient ischemic attack (TIA), and cerebral infarction without residual deficits: Secondary | ICD-10-CM | POA: Diagnosis not present

## 2015-12-10 DIAGNOSIS — I739 Peripheral vascular disease, unspecified: Secondary | ICD-10-CM | POA: Diagnosis present

## 2015-12-10 DIAGNOSIS — I509 Heart failure, unspecified: Secondary | ICD-10-CM

## 2015-12-10 DIAGNOSIS — K760 Fatty (change of) liver, not elsewhere classified: Secondary | ICD-10-CM

## 2015-12-10 DIAGNOSIS — D638 Anemia in other chronic diseases classified elsewhere: Secondary | ICD-10-CM | POA: Diagnosis present

## 2015-12-10 DIAGNOSIS — I110 Hypertensive heart disease with heart failure: Principal | ICD-10-CM | POA: Diagnosis present

## 2015-12-10 DIAGNOSIS — R531 Weakness: Secondary | ICD-10-CM | POA: Diagnosis present

## 2015-12-10 DIAGNOSIS — T383X5A Adverse effect of insulin and oral hypoglycemic [antidiabetic] drugs, initial encounter: Secondary | ICD-10-CM | POA: Diagnosis present

## 2015-12-10 DIAGNOSIS — D519 Vitamin B12 deficiency anemia, unspecified: Secondary | ICD-10-CM

## 2015-12-10 DIAGNOSIS — R262 Difficulty in walking, not elsewhere classified: Secondary | ICD-10-CM

## 2015-12-10 DIAGNOSIS — G2581 Restless legs syndrome: Secondary | ICD-10-CM | POA: Diagnosis present

## 2015-12-10 DIAGNOSIS — Z79899 Other long term (current) drug therapy: Secondary | ICD-10-CM | POA: Diagnosis not present

## 2015-12-10 DIAGNOSIS — Z7982 Long term (current) use of aspirin: Secondary | ICD-10-CM

## 2015-12-10 DIAGNOSIS — L304 Erythema intertrigo: Secondary | ICD-10-CM | POA: Diagnosis present

## 2015-12-10 DIAGNOSIS — R296 Repeated falls: Secondary | ICD-10-CM | POA: Diagnosis present

## 2015-12-10 DIAGNOSIS — K219 Gastro-esophageal reflux disease without esophagitis: Secondary | ICD-10-CM | POA: Diagnosis present

## 2015-12-10 DIAGNOSIS — Z9111 Patient's noncompliance with dietary regimen: Secondary | ICD-10-CM | POA: Diagnosis not present

## 2015-12-10 DIAGNOSIS — Z794 Long term (current) use of insulin: Secondary | ICD-10-CM | POA: Diagnosis not present

## 2015-12-10 DIAGNOSIS — Z800 Family history of malignant neoplasm of digestive organs: Secondary | ICD-10-CM

## 2015-12-10 DIAGNOSIS — E160 Drug-induced hypoglycemia without coma: Secondary | ICD-10-CM | POA: Diagnosis present

## 2015-12-10 DIAGNOSIS — R2689 Other abnormalities of gait and mobility: Secondary | ICD-10-CM

## 2015-12-10 DIAGNOSIS — Z8249 Family history of ischemic heart disease and other diseases of the circulatory system: Secondary | ICD-10-CM

## 2015-12-10 DIAGNOSIS — M6281 Muscle weakness (generalized): Secondary | ICD-10-CM

## 2015-12-10 LAB — BASIC METABOLIC PANEL
Anion gap: 11 (ref 5–15)
BUN: 21 mg/dL — AB (ref 6–20)
CHLORIDE: 87 mmol/L — AB (ref 101–111)
CO2: 38 mmol/L — AB (ref 22–32)
CREATININE: 1.26 mg/dL — AB (ref 0.44–1.00)
Calcium: 8.6 mg/dL — ABNORMAL LOW (ref 8.9–10.3)
GFR calc non Af Amer: 42 mL/min — ABNORMAL LOW (ref >60)
GFR, EST AFRICAN AMERICAN: 49 mL/min — AB (ref >60)
GLUCOSE: 213 mg/dL — AB (ref 65–99)
Potassium: 3.1 mmol/L — ABNORMAL LOW (ref 3.5–5.1)
Sodium: 136 mmol/L (ref 135–145)

## 2015-12-10 LAB — CBC
HCT: 29.7 % — ABNORMAL LOW (ref 35.0–47.0)
Hemoglobin: 9.7 g/dL — ABNORMAL LOW (ref 12.0–16.0)
MCH: 26.4 pg (ref 26.0–34.0)
MCHC: 32.8 g/dL (ref 32.0–36.0)
MCV: 80.5 fL (ref 80.0–100.0)
PLATELETS: 227 10*3/uL (ref 150–440)
RBC: 3.69 MIL/uL — AB (ref 3.80–5.20)
RDW: 22.3 % — ABNORMAL HIGH (ref 11.5–14.5)
WBC: 7.6 10*3/uL (ref 3.6–11.0)

## 2015-12-10 LAB — URINALYSIS COMPLETE WITH MICROSCOPIC (ARMC ONLY)
Bilirubin Urine: NEGATIVE
GLUCOSE, UA: NEGATIVE mg/dL
HGB URINE DIPSTICK: NEGATIVE
Ketones, ur: NEGATIVE mg/dL
Leukocytes, UA: NEGATIVE
Nitrite: NEGATIVE
PH: 6 (ref 5.0–8.0)
Protein, ur: NEGATIVE mg/dL
Specific Gravity, Urine: 1.008 (ref 1.005–1.030)

## 2015-12-10 LAB — HEPATIC FUNCTION PANEL
ALBUMIN: 2.5 g/dL — AB (ref 3.5–5.0)
ALK PHOS: 126 U/L (ref 38–126)
ALT: 22 U/L (ref 14–54)
AST: 46 U/L — ABNORMAL HIGH (ref 15–41)
Bilirubin, Direct: 0.2 mg/dL (ref 0.1–0.5)
Indirect Bilirubin: 1 mg/dL — ABNORMAL HIGH (ref 0.3–0.9)
TOTAL PROTEIN: 6.9 g/dL (ref 6.5–8.1)
Total Bilirubin: 1.2 mg/dL (ref 0.3–1.2)

## 2015-12-10 LAB — GLUCOSE, CAPILLARY
GLUCOSE-CAPILLARY: 101 mg/dL — AB (ref 65–99)
Glucose-Capillary: 158 mg/dL — ABNORMAL HIGH (ref 65–99)

## 2015-12-10 LAB — TROPONIN I: TROPONIN I: 0.05 ng/mL — AB (ref <0.03)

## 2015-12-10 MED ORDER — INSULIN ASPART 100 UNIT/ML ~~LOC~~ SOLN
0.0000 [IU] | Freq: Every day | SUBCUTANEOUS | Status: DC
  Administered 2015-12-12: 3 [IU] via SUBCUTANEOUS
  Administered 2015-12-13: 5 [IU] via SUBCUTANEOUS
  Filled 2015-12-10: qty 5
  Filled 2015-12-10: qty 3
  Filled 2015-12-10: qty 2

## 2015-12-10 MED ORDER — PRAVASTATIN SODIUM 20 MG PO TABS
20.0000 mg | ORAL_TABLET | Freq: Every day | ORAL | Status: DC
  Administered 2015-12-10 – 2015-12-13 (×4): 20 mg via ORAL
  Filled 2015-12-10 (×4): qty 1

## 2015-12-10 MED ORDER — ASPIRIN EC 81 MG PO TBEC
81.0000 mg | DELAYED_RELEASE_TABLET | Freq: Every day | ORAL | Status: DC
  Administered 2015-12-11 – 2015-12-14 (×4): 81 mg via ORAL
  Filled 2015-12-10 (×4): qty 1

## 2015-12-10 MED ORDER — ACETAMINOPHEN 325 MG PO TABS: 650.0000 mg | ORAL_TABLET | Freq: Four times a day (QID) | ORAL | Status: DC | PRN

## 2015-12-10 MED ORDER — SODIUM CHLORIDE 0.9% FLUSH
3.0000 mL | Freq: Two times a day (BID) | INTRAVENOUS | Status: DC
  Administered 2015-12-10 – 2015-12-12 (×2): 3 mL via INTRAVENOUS

## 2015-12-10 MED ORDER — DOCUSATE SODIUM 100 MG PO CAPS
100.0000 mg | ORAL_CAPSULE | Freq: Two times a day (BID) | ORAL | Status: DC
  Administered 2015-12-10 – 2015-12-14 (×8): 100 mg via ORAL
  Filled 2015-12-10 (×10): qty 1

## 2015-12-10 MED ORDER — ONDANSETRON HCL 4 MG PO TABS: 4.0000 mg | ORAL_TABLET | Freq: Four times a day (QID) | ORAL | Status: DC | PRN

## 2015-12-10 MED ORDER — ENOXAPARIN SODIUM 40 MG/0.4ML ~~LOC~~ SOLN
40.0000 mg | Freq: Two times a day (BID) | SUBCUTANEOUS | Status: DC
  Administered 2015-12-10 – 2015-12-14 (×8): 40 mg via SUBCUTANEOUS
  Filled 2015-12-10 (×8): qty 0.4

## 2015-12-10 MED ORDER — SODIUM CHLORIDE 0.9% FLUSH
3.0000 mL | Freq: Two times a day (BID) | INTRAVENOUS | Status: DC
  Administered 2015-12-10 – 2015-12-13 (×7): 3 mL via INTRAVENOUS

## 2015-12-10 MED ORDER — SODIUM CHLORIDE 0.9% FLUSH: 3.0000 mL | INTRAVENOUS | Status: DC | PRN

## 2015-12-10 MED ORDER — DAPSONE 25 MG PO TABS
25.0000 mg | ORAL_TABLET | Freq: Every day | ORAL | Status: DC
  Administered 2015-12-11 – 2015-12-14 (×4): 25 mg via ORAL
  Filled 2015-12-10 (×6): qty 1

## 2015-12-10 MED ORDER — METFORMIN HCL ER 500 MG PO TB24
1000.0000 mg | ORAL_TABLET | Freq: Two times a day (BID) | ORAL | Status: DC
  Administered 2015-12-10 – 2015-12-11 (×2): 1000 mg via ORAL
  Filled 2015-12-10 (×2): qty 2

## 2015-12-10 MED ORDER — ONDANSETRON HCL 4 MG/2ML IJ SOLN: 4.0000 mg | Freq: Four times a day (QID) | INTRAMUSCULAR | Status: DC | PRN

## 2015-12-10 MED ORDER — POTASSIUM CHLORIDE CRYS ER 20 MEQ PO TBCR: 40.0000 meq | EXTENDED_RELEASE_TABLET | Freq: Every day | ORAL | 2 refills | Status: DC

## 2015-12-10 MED ORDER — ACETAMINOPHEN 650 MG RE SUPP: 650.0000 mg | Freq: Four times a day (QID) | RECTAL | Status: DC | PRN

## 2015-12-10 MED ORDER — INSULIN GLARGINE 100 UNIT/ML ~~LOC~~ SOLN
10.0000 [IU] | Freq: Two times a day (BID) | SUBCUTANEOUS | Status: DC
  Administered 2015-12-10 – 2015-12-12 (×4): 10 [IU] via SUBCUTANEOUS
  Filled 2015-12-10 (×5): qty 0.1

## 2015-12-10 MED ORDER — POTASSIUM CHLORIDE CRYS ER 20 MEQ PO TBCR
40.0000 meq | EXTENDED_RELEASE_TABLET | ORAL | Status: AC
  Administered 2015-12-10 – 2015-12-11 (×2): 40 meq via ORAL
  Filled 2015-12-10 (×2): qty 2

## 2015-12-10 MED ORDER — TRAMADOL HCL 50 MG PO TABS
50.0000 mg | ORAL_TABLET | Freq: Four times a day (QID) | ORAL | Status: DC | PRN
  Administered 2015-12-11: 50 mg via ORAL
  Filled 2015-12-10: qty 1

## 2015-12-10 MED ORDER — POLYETHYLENE GLYCOL 3350 17 G PO PACK: 17.0000 g | PACK | Freq: Every day | ORAL | Status: DC | PRN

## 2015-12-10 MED ORDER — GLIMEPIRIDE 2 MG PO TABS
2.0000 mg | ORAL_TABLET | Freq: Two times a day (BID) | ORAL | Status: DC
  Administered 2015-12-10 – 2015-12-11 (×2): 2 mg via ORAL
  Filled 2015-12-10 (×4): qty 1

## 2015-12-10 MED ORDER — FUROSEMIDE 10 MG/ML IJ SOLN
60.0000 mg | Freq: Once | INTRAMUSCULAR | Status: AC
  Administered 2015-12-10: 60 mg via INTRAVENOUS
  Filled 2015-12-10: qty 8

## 2015-12-10 MED ORDER — FUROSEMIDE 10 MG/ML IJ SOLN
60.0000 mg | Freq: Two times a day (BID) | INTRAMUSCULAR | Status: DC
  Administered 2015-12-11 – 2015-12-14 (×7): 60 mg via INTRAVENOUS
  Filled 2015-12-10 (×7): qty 6

## 2015-12-10 MED ORDER — OXYCODONE-ACETAMINOPHEN 5-325 MG PO TABS
1.0000 | ORAL_TABLET | Freq: Three times a day (TID) | ORAL | Status: DC | PRN
  Administered 2015-12-11 – 2015-12-13 (×3): 1 via ORAL
  Filled 2015-12-10 (×4): qty 1

## 2015-12-10 MED ORDER — GABAPENTIN 800 MG PO TABS
800.0000 mg | ORAL_TABLET | Freq: Two times a day (BID) | ORAL | Status: DC
  Filled 2015-12-10: qty 1

## 2015-12-10 MED ORDER — FUROSEMIDE 40 MG PO TABS: 40.0000 mg | ORAL_TABLET | Freq: Two times a day (BID) | ORAL | 0 refills | Status: DC

## 2015-12-10 MED ORDER — FERROUS SULFATE 325 (65 FE) MG PO TABS
325.0000 mg | ORAL_TABLET | Freq: Every day | ORAL | Status: DC
  Administered 2015-12-11 – 2015-12-14 (×4): 325 mg via ORAL
  Filled 2015-12-10 (×4): qty 1

## 2015-12-10 MED ORDER — GABAPENTIN 400 MG PO CAPS
800.0000 mg | ORAL_CAPSULE | Freq: Two times a day (BID) | ORAL | Status: DC
  Administered 2015-12-10 – 2015-12-14 (×8): 800 mg via ORAL
  Filled 2015-12-10 (×8): qty 2

## 2015-12-10 MED ORDER — SODIUM CHLORIDE 0.9 % IV SOLN: 250.0000 mL | INTRAVENOUS | Status: DC | PRN

## 2015-12-10 MED ORDER — PANTOPRAZOLE SODIUM 40 MG PO TBEC
40.0000 mg | DELAYED_RELEASE_TABLET | Freq: Every day | ORAL | Status: DC
  Administered 2015-12-10 – 2015-12-14 (×5): 40 mg via ORAL
  Filled 2015-12-10 (×5): qty 1

## 2015-12-10 MED ORDER — INSULIN ASPART 100 UNIT/ML ~~LOC~~ SOLN
0.0000 [IU] | Freq: Three times a day (TID) | SUBCUTANEOUS | Status: DC
  Administered 2015-12-11: 2 [IU] via SUBCUTANEOUS
  Administered 2015-12-12: 3 [IU] via SUBCUTANEOUS
  Administered 2015-12-12: 11 [IU] via SUBCUTANEOUS
  Administered 2015-12-13: 5 [IU] via SUBCUTANEOUS
  Administered 2015-12-13: 3 [IU] via SUBCUTANEOUS
  Administered 2015-12-14 (×2): 2 [IU] via SUBCUTANEOUS
  Administered 2015-12-14: 3 [IU] via SUBCUTANEOUS
  Filled 2015-12-10 (×2): qty 3
  Filled 2015-12-10: qty 2
  Filled 2015-12-10: qty 3
  Filled 2015-12-10: qty 2
  Filled 2015-12-10: qty 11

## 2015-12-10 MED ORDER — ALBUTEROL SULFATE (2.5 MG/3ML) 0.083% IN NEBU: 2.5000 mg | INHALATION_SOLUTION | RESPIRATORY_TRACT | Status: DC | PRN

## 2015-12-10 MED ORDER — POTASSIUM CHLORIDE CRYS ER 20 MEQ PO TBCR
40.0000 meq | EXTENDED_RELEASE_TABLET | Freq: Every day | ORAL | Status: DC
  Administered 2015-12-10 – 2015-12-13 (×4): 40 meq via ORAL
  Filled 2015-12-10 (×5): qty 2

## 2015-12-10 NOTE — ED Notes (Signed)
Pt's sat 89% on room air. Pt placed on 2L via .

## 2015-12-10 NOTE — Telephone Encounter (Signed)
Spoken to patients care taker joyce.  She states that patient has been falling a lot more, not much dizziness but due to weakness. Spo2 has been dropping from 96% to 90%.  Patient is taking a light pain medication for sx. Blood sugars have been 120-150 as of late. Medication of lasix is being refilled for one month.  Cardiology will be taking over as prescriber for the lasix.

## 2015-12-10 NOTE — Telephone Encounter (Signed)
Okay for 30 day refill; future refills need to come from cardiology as he is overseeing her CHF.   Please let us know if SOB improves with lasix.  Please monitor weight, swelling in legs.

## 2015-12-10 NOTE — ED Notes (Addendum)
EMS vitals: 121 CBG, 111/76, 70s HR

## 2015-12-10 NOTE — ED Provider Notes (Signed)
[facility] Emergency Department Provider Note    First MD Initiated Contact with Patient 12/10/15 1420     (approximate)  I have reviewed the triage vital signs and the nursing notes.   HISTORY  Chief Complaint Weakness and Leg Pain    HPI [name] is a 70 y.o. female with multiple comorbidities as listed above presents from home after multiple falls from standing. Patient generally ambulates with a walker. Has had multiple falls with head injuries over the past week due to worsening weakness. No numbness or tingling. Patient does state that she's been having dizzy spells for the past 1 month. States that she is having of left hip pain and is unable to bear weight. Patient has a caretaker states that at night her oxygen saturation is dipping down to 80. She does not wear home oxygen. Does have a history of congestive heart failure. Is not on any blood thinners.   Past Medical History:  Diagnosis Date  . Anemia   . Asthma   . CVA (cerebral vascular accident) (HCC)   . Diabetes mellitus without complication (HCC)   . GERD (gastroesophageal reflux disease)   . Heart murmur   . History of hiatal hernia   . Hypertension   . Panic attacks   . Peripheral vascular disease (HCC)   . Restless leg syndrome   . Shortness of breath dyspnea     Patient Active Problem List   Diagnosis Date Noted  . Fatty liver 12/10/2015  . Fracture of multiple ribs 11/25/2015  . B12 deficiency anemia 11/03/2015  . SOB (shortness of breath) on exertion 11/03/2015  . Head injury 10/29/2015  . GERD (gastroesophageal reflux disease) 10/17/2015  . Pure hypercholesterolemia 10/17/2015  . Type 2 diabetes mellitus (HCC) 10/05/2015  . Depression 10/05/2015  . Rash and nonspecific skin eruption 10/05/2015  . CHF (congestive heart failure) (HCC) 03/13/2015  . Carotid stenosis 07/24/2014    Past Surgical History:  Procedure Laterality Date  . ABDOMINAL HYSTERECTOMY    .  BREAST BIOPSY Right yrs ago   benign  . ENDARTERECTOMY Right 07/24/2014   Procedure: ENDARTERECTOMY CAROTID;  Surgeon: Annice Needy, MD;  Location: [facility] ORS;  Service: Vascular;  Laterality: Right;  . EYE SURGERY Left    cataract  . FRACTURE SURGERY Left    fractured ankle  . TONSILLECTOMY      Prior to Admission medications   Medication Sig Start Date End Date Taking? Authorizing Provider  clobetasol ointment (TEMOVATE) 0.05 % Apply to rash twice daily 11/10/15  Yes Historical Provider, MD  cyanocobalamin (,VITAMIN B-12,) 1000 MCG/ML injection 1000 mcg (1 mg) injection once per week for four weeks, followed by 1000 mcg injection once per month. Patient taking differently: Inject 1,000 mcg into the skin every 30 (thirty) days. *Inject in shoulder* 11/06/15  Yes Allegra Grana, FNP  dapsone 25 MG tablet Take 25 mg by mouth daily. 12/03/15 01/02/16 Yes Historical Provider, MD  diclofenac (VOLTAREN) 50 MG EC tablet Take 50 mg by mouth 2 (two) times daily as needed for moderate pain.   Yes Historical Provider, MD  ferrous sulfate 325 (65 FE) MG tablet Take 1 tablet (325 mg total) by mouth daily with breakfast. 11/03/15  Yes Allegra Grana, FNP  fluconazole (DIFLUCAN) 150 MG tablet Take 150 mg by mouth every morning. For 3 weeks 08/19/15  Yes Historical Provider, MD  furosemide (LASIX) 40 MG tablet Take 1 tablet (40 mg total) by mouth 2 (  two) times daily. Patient taking differently: Take 40 mg by mouth 3 (three) times daily.  12/10/15  Yes Allegra Grana, FNP  gabapentin (NEURONTIN) 800 MG tablet Take 800 mg by mouth 2 (two) times daily.   Yes Historical Provider, MD  glimepiride (AMARYL) 2 MG tablet Take 2 mg by mouth 2 (two) times daily.   Yes Historical Provider, MD  hydrochlorothiazide (HYDRODIURIL) 12.5 MG tablet Take 12.5 mg by mouth 2 (two) times daily.    Yes Historical Provider, MD  insulin aspart (NOVOLOG) 100 UNIT/ML injection Inject 15 Units into the skin 2 (two) times daily.    Yes  Historical Provider, MD  insulin glargine (LANTUS) 100 UNIT/ML injection Inject 10 Units into the skin 2 (two) times daily. (Supper and Lunch)   Yes Historical Provider, MD  loratadine (CLARITIN) 10 MG tablet Take 10 mg by mouth daily as needed for allergies.    Yes Historical Provider, MD  lovastatin (MEVACOR) 20 MG tablet Take 20 mg by mouth at bedtime.   Yes Historical Provider, MD  metFORMIN (GLUCOPHAGE-XR) 500 MG 24 hr tablet Take 1,000 mg by mouth 2 (two) times daily.   Yes Historical Provider, MD  naproxen (NAPROSYN) 500 MG tablet Take 500 mg by mouth at bedtime.    Yes Historical Provider, MD  omeprazole (PRILOSEC) 40 MG capsule Take 40 mg by mouth at bedtime.    Yes Historical Provider, MD  oxyCODONE-acetaminophen (PERCOCET) 5-325 MG tablet Take 1 tablet by mouth every 8 (eight) hours as needed for moderate pain or severe pain. 11/25/15  Yes Glori Luis, MD  potassium chloride SA (K-DUR,KLOR-CON) 20 MEQ tablet Take 2 tablets (40 mEq total) by mouth daily. 12/10/15  Yes Allegra Grana, FNP  triamcinolone (KENALOG) 0.025 % cream Apply 1 application topically every Monday, Wednesday, and Friday. 12/03/15 12/02/16 Yes Historical Provider, MD  aspirin EC 81 MG tablet Take 81 mg by mouth daily.    Historical Provider, MD  cephALEXin (KEFLEX) 500 MG capsule  10/24/15   Historical Provider, MD  mupirocin cream (BACTROBAN) 2 % Apply 1 application topically 3 (three) times daily. Patient not taking: Reported on 12/10/2015 10/29/15   Allegra Grana, FNP  mupirocin ointment (BACTROBAN) 2 % Place 1 application into the nose 2 (two) times daily. Patient not taking: Reported on 12/10/2015 11/03/15   Allegra Grana, FNP    Allergies Codeine; Liraglutide; and Wheat bran  Family History  Problem Relation Age of Onset  . Pancreatic cancer Mother   . CAD Father   . Hypertension Father   . Pancreatic cancer Father   . CAD Brother   . Breast cancer Maternal Aunt     pt states several maternal  aunts    Social History Social History  Substance Use Topics  . Smoking status: Never Smoker  . Smokeless tobacco: Never Used  . Alcohol use No    Review of Systems Patient denies headaches, rhinorrhea, blurry vision, numbness, shortness of breath, chest pain, edema, cough, abdominal pain, nausea, vomiting, diarrhea, dysuria, fevers, rashes or hallucinations unless otherwise stated above in HPI. ____________________________________________   PHYSICAL EXAM:  VITAL SIGNS: Vitals:   12/10/15 1807 12/10/15 1926  BP: 135/60 129/60  Pulse: 85 80  Resp: 18 16  Temp: 97.8 F (36.6 C) 97.6 F (36.4 C)    Constitutional: Alert and oriented. Obese and chronically ill-appearing female in moderate respiratory distress Eyes: Conjunctivae are normal. PERRL. EOMI. Head: Scattered ecchymosis most prominent on the left forehead Nose: No  congestion/rhinnorhea. Mouth/Throat: Mucous membranes are moist.  Oropharynx non-erythematous. Neck: No stridor. Painless ROM. No cervical spine tenderness to palpation Hematological/Lymphatic/Immunilogical: No cervical lymphadenopathy. Cardiovascular: Normal rate, regular rhythm. Crescendo decrescendo therefore murmur.  Good peripheral circulation. Respiratory: Normal respiratory effort.  No retractions. Lungs with expiratory crackles and anterior lung fields with bilateral diminished breath sounds in the bases. Gastrointestinal: Soft and nontender. No distention. No abdominal bruits. No CVA tenderness. Musculoskeletal: Lateral lower extremity edema. Tenderness to palpation on left hip logroll. PMS intact distally.  No joint effusions. Neurologic:  Normal speech and language. No gross focal neurologic deficits are appreciated. Facial droop Skin:  Skin is warm, dry and intact. No rash noted. Psychiatric: Mood and affect are normal. Speech and behavior are normal.  ____________________________________________   LABS (all labs ordered are listed, but only  abnormal results are displayed)  Results for orders placed or performed during the hospital encounter of 12/10/15 (from the past 24 hour(s))  Basic metabolic panel     Status: Abnormal   Collection Time: 12/10/15  1:48 PM  Result Value Ref Range   Sodium 136 135 - 145 mmol/L   Potassium 3.1 (L) 3.5 - 5.1 mmol/L   Chloride 87 (L) 101 - 111 mmol/L   CO2 38 (H) 22 - 32 mmol/L   Glucose, Bld 213 (H) 65 - 99 mg/dL   BUN 21 (H) 6 - 20 mg/dL   Creatinine, Ser 1.60 (H) 0.44 - 1.00 mg/dL   Calcium 8.6 (L) 8.9 - 10.3 mg/dL   GFR calc non Af Amer 42 (L) >60 mL/min   GFR calc Af Amer 49 (L) >60 mL/min   Anion gap 11 5 - 15  CBC     Status: Abnormal   Collection Time: 12/10/15  1:48 PM  Result Value Ref Range   WBC 7.6 3.6 - 11.0 K/uL   RBC 3.69 (L) 3.80 - 5.20 MIL/uL   Hemoglobin 9.7 (L) 12.0 - 16.0 g/dL   HCT 10.9 (L) 32.3 - 55.7 %   MCV 80.5 80.0 - 100.0 fL   MCH 26.4 26.0 - 34.0 pg   MCHC 32.8 32.0 - 36.0 g/dL   RDW 32.2 (H) 02.5 - 42.7 %   Platelets 227 150 - 440 K/uL  Hepatic function panel     Status: Abnormal   Collection Time: 12/10/15  1:48 PM  Result Value Ref Range   Total Protein 6.9 6.5 - 8.1 g/dL   Albumin 2.5 (L) 3.5 - 5.0 g/dL   AST 46 (H) 15 - 41 U/L   ALT 22 14 - 54 U/L   Alkaline Phosphatase 126 38 - 126 U/L   Total Bilirubin 1.2 0.3 - 1.2 mg/dL   Bilirubin, Direct 0.2 0.1 - 0.5 mg/dL   Indirect Bilirubin 1.0 (H) 0.3 - 0.9 mg/dL  Troponin I     Status: Abnormal   Collection Time: 12/10/15  1:48 PM  Result Value Ref Range   Troponin I 0.05 (HH) <0.03 ng/mL  Urinalysis complete, with microscopic     Status: Abnormal   Collection Time: 12/10/15  4:54 PM  Result Value Ref Range   Color, Urine YELLOW (A) YELLOW   APPearance CLEAR (A) CLEAR   Glucose, UA NEGATIVE NEGATIVE mg/dL   Bilirubin Urine NEGATIVE NEGATIVE   Ketones, ur NEGATIVE NEGATIVE mg/dL   Specific Gravity, Urine 1.008 1.005 - 1.030   Hgb urine dipstick NEGATIVE NEGATIVE   pH 6.0 5.0 - 8.0    Protein, ur NEGATIVE NEGATIVE mg/dL  Nitrite NEGATIVE NEGATIVE   Leukocytes, UA NEGATIVE NEGATIVE   RBC / HPF 0-5 0 - 5 RBC/hpf   WBC, UA 0-5 0 - 5 WBC/hpf   Bacteria, UA RARE (A) NONE SEEN   Squamous Epithelial / LPF 0-5 (A) NONE SEEN   Mucous PRESENT    Hyaline Casts, UA PRESENT   Glucose, capillary     Status: Abnormal   Collection Time: 12/10/15  6:42 PM  Result Value Ref Range   Glucose-Capillary 101 (H) 65 - 99 mg/dL  Glucose, capillary     Status: Abnormal   Collection Time: 12/10/15  9:15 PM  Result Value Ref Range   Glucose-Capillary 158 (H) 65 - 99 mg/dL   ____________________________________________  EKG My review and personal interpretation at Time: 13:35   Indication: sob  Rate: 85  Rhythm: sinus Axis: left Other: no acute ischemia, normal intervals ____________________________________________  RADIOLOGY  I personally reviewed all radiographic images ordered to evaluate for the above acute complaints and reviewed radiology reports and findings.  These findings were personally discussed with the patient.  Please see medical record for radiology report. ____________________________________________   PROCEDURES  Procedure(s) performed: none    Critical Care performed: no ____________________________________________   INITIAL IMPRESSION / ASSESSMENT AND PLAN / ED COURSE  Pertinent labs & imaging results that were available during my care of the patient were reviewed by me and considered in my medical decision making (see chart for details).  DDX: Atraumatic head injury, fracture, contusion, congestive heart failure, COPD, pneumonia  [name] is a 70 y.o. who presents to the ED with recurrent falls, generalized weakness and left hip pain. Patient also with acute respiratory failure with hypoxia. Otherwise afebrile and hemodynamic stable. Will order imaging due to concern for traumatic injury given her recurrent falls. No clinical evidence of CVA on  neuro exam. The patient will be placed on continuous pulse oximetry and telemetry for monitoring.  Laboratory evaluation will be sent to evaluate for the above complaints.     Clinical Course  Comment By Time  Patient desaturated to below 88% on room air while at rest. This is associated with worsening dyspnea.  Chest x-ray shows cardiomegaly with interstitial edema. Will give IV Lasix. Willy Eddy, MD 10/12 [phone]  Noted mildly elevated troponin as well as hypokalemia and renal insufficiency. Anemia appears to be a baseline. CT head without evidence of acute traumatic injury. No evidence of fracture of the left hip. Based on her acute hypoxia with shortness of breath and difficulty completing short phrases will require admission..  Have discussed with the patient and available family all diagnostics and treatments performed thus far and all questions were answered to the best of my ability. The patient demonstrates understanding and agreement with plan.  Willy Eddy, MD 10/12 1647     ____________________________________________   FINAL CLINICAL IMPRESSION(S) / ED DIAGNOSES  Final diagnoses:  Acute respiratory failure with hypoxia (HCC)  Acute on chronic combined systolic and diastolic congestive heart failure (HCC)      NEW MEDICATIONS STARTED DURING THIS VISIT:  Current Discharge Medication List    START taking these medications   Details  potassium chloride SA (K-DUR,KLOR-CON) 20 MEQ tablet Take 2 tablets (40 mEq total) by mouth daily. Qty: 30 tablet, Refills: 2   Associated Diagnoses: Hypokalemia         Note:  This document was prepared using Dragon voice recognition software and may include unintentional dictation errors.    Willy Eddy, MD 12/10/15  2345  

## 2015-12-10 NOTE — ED Triage Notes (Signed)
Pt to ED via EMS from home c/o weakness x1 week, bilateral leg/hip pain worse in the left.  EMS states mechanical fall on Monday and patient experiencing dizzy spells since.  Pt had blood work done at [facility] on Monday, Iron level 8.0.  Pt presents A&Ox4, neurologically intact, chest rise even and unlabored.

## 2015-12-10 NOTE — Telephone Encounter (Signed)
Ok to refill? I now medications have been changing a lot with patient.please advise.

## 2015-12-10 NOTE — ED Notes (Signed)
Pt's oxygen saturation 86% on room air. Pt placed on 2L in room.

## 2015-12-10 NOTE — H&P (Addendum)
[facility]   PATIENT NAME: [name]    MR#:  [id number]  DATE OF BIRTH:  01-01-1946  DATE OF ADMISSION:  12/10/2015  PRIMARY CARE PHYSICIAN: Rennie Plowman, FNP   REQUESTING/REFERRING PHYSICIAN: Dr. Roxan Hockey  CHIEF COMPLAINT:   Chief Complaint  Patient presents with  . Weakness  . Leg Pain    HISTORY OF PRESENT ILLNESS:  [name]  is a 70 y.o. female with a known history of HTN, DM, Diastolic chf, Falls here with weakness, SOB, hypoxia into 80s on RA with [facility] nurse. Here CXR shows pulm edema. Had a recent fall. Falls and weakness chronic problem but worse. On lasix at home. Dose increased from 40mg  BID to TID 1 week back with no response She does watch the salt in diet but ate some salted cashews and popcorn everyday. Drinks plenty of fluids  PAST MEDICAL HISTORY:   Past Medical History:  Diagnosis Date  . Anemia   . Asthma   . CVA (cerebral vascular accident) (HCC)   . Diabetes mellitus without complication (HCC)   . GERD (gastroesophageal reflux disease)   . Heart murmur   . History of hiatal hernia   . Hypertension   . Panic attacks   . Peripheral vascular disease (HCC)   . Restless leg syndrome   . Shortness of breath dyspnea     PAST SURGICAL HISTORY:   Past Surgical History:  Procedure Laterality Date  . ABDOMINAL HYSTERECTOMY    . BREAST BIOPSY Right yrs ago   benign  . ENDARTERECTOMY Right 07/24/2014   Procedure: ENDARTERECTOMY CAROTID;  Surgeon: Annice Needy, MD;  Location: [facility] ORS;  Service: Vascular;  Laterality: Right;  . EYE SURGERY Left    cataract  . FRACTURE SURGERY Left    fractured ankle  . TONSILLECTOMY      SOCIAL HISTORY:   Social History  Substance Use Topics  . Smoking status: Never Smoker  . Smokeless tobacco: Never Used  . Alcohol use No    FAMILY HISTORY:   Family History  Problem Relation Age of Onset  . Pancreatic cancer Mother   . CAD Father   .  Hypertension Father   . Pancreatic cancer Father   . CAD Brother   . Breast cancer Maternal Aunt     pt states several maternal aunts    DRUG ALLERGIES:   Allergies  Allergen Reactions  . Codeine Other (See Comments)    Reaction:  Dizziness   . Liraglutide     Other reaction(s): nausea and stomach pain  . Wheat Bran Other (See Comments)    Reaction:  Sneezing     REVIEW OF SYSTEMS:   Review of Systems  Constitutional: Positive for malaise/fatigue. Negative for chills, fever and weight loss.  HENT: Negative for hearing loss and nosebleeds.   Eyes: Negative for blurred vision, double vision and pain.  Respiratory: Positive for shortness of breath. Negative for cough, hemoptysis, sputum production and wheezing.   Cardiovascular: Positive for orthopnea and leg swelling. Negative for chest pain and palpitations.  Gastrointestinal: Negative for abdominal pain, constipation, diarrhea, nausea and vomiting.  Genitourinary: Negative for dysuria and hematuria.  Musculoskeletal: Positive for falls and joint pain. Negative for back pain and myalgias.  Skin: Negative for rash.  Neurological: Positive for dizziness and weakness. Negative for tremors, sensory change, speech change, focal weakness, seizures and headaches.  Endo/Heme/Allergies: Does not bruise/bleed easily.  Psychiatric/Behavioral: Negative for depression and memory loss.  The patient is not nervous/anxious.     MEDICATIONS AT HOME:   Prior to Admission medications   Medication Sig Start Date End Date Taking? Authorizing Provider  aspirin EC 81 MG tablet Take 81 mg by mouth daily.    Historical Provider, MD  cephALEXin (KEFLEX) 500 MG capsule  10/24/15   Historical Provider, MD  clobetasol ointment (TEMOVATE) 0.05 % Apply to rash twice daily 11/10/15   Historical Provider, MD  cyanocobalamin (,VITAMIN B-12,) 1000 MCG/ML injection 1000 mcg (1 mg) injection once per week for four weeks, followed by 1000 mcg injection once per  month. 11/06/15   Allegra Grana, FNP  ferrous sulfate 325 (65 FE) MG tablet Take 1 tablet (325 mg total) by mouth daily with breakfast. 11/03/15   Allegra Grana, FNP  fluconazole (DIFLUCAN) 150 MG tablet Take 150 mg by mouth every Wednesday. For 3 weeks 08/19/15   Historical Provider, MD  furosemide (LASIX) 40 MG tablet Take 1 tablet (40 mg total) by mouth 2 (two) times daily. 12/10/15   Allegra Grana, FNP  gabapentin (NEURONTIN) 800 MG tablet Take 800 mg by mouth 2 (two) times daily.    Historical Provider, MD  glimepiride (AMARYL) 2 MG tablet Take 2 mg by mouth 2 (two) times daily.    Historical Provider, MD  hydrochlorothiazide (HYDRODIURIL) 12.5 MG tablet Take 12.5 mg by mouth daily.    Historical Provider, MD  insulin aspart (NOVOLOG) 100 UNIT/ML injection Inject into the skin as needed for high blood sugar.    Historical Provider, MD  insulin glargine (LANTUS) 100 UNIT/ML injection Inject into the skin daily.    Historical Provider, MD  loratadine (CLARITIN) 10 MG tablet Take 10 mg by mouth daily.    Historical Provider, MD  lovastatin (MEVACOR) 20 MG tablet Take 20 mg by mouth at bedtime.    Historical Provider, MD  metFORMIN (GLUCOPHAGE-XR) 500 MG 24 hr tablet Take 1,000 mg by mouth 2 (two) times daily.    Historical Provider, MD  mupirocin cream (BACTROBAN) 2 % Apply 1 application topically 3 (three) times daily. 10/29/15   Allegra Grana, FNP  mupirocin ointment (BACTROBAN) 2 % Place 1 application into the nose 2 (two) times daily. 11/03/15   Allegra Grana, FNP  naproxen (NAPROSYN) 500 MG tablet Take 500 mg by mouth daily.    Historical Provider, MD  nystatin (MYCOSTATIN/NYSTOP) powder Apply topically 2 (two) times daily.    Historical Provider, MD  omeprazole (PRILOSEC) 40 MG capsule Take 40 mg by mouth at bedtime.     Historical Provider, MD  oxyCODONE-acetaminophen (PERCOCET) 5-325 MG tablet Take 1 tablet by mouth every 8 (eight) hours as needed for moderate pain or severe  pain. 11/25/15   Glori Luis, MD  potassium chloride SA (K-DUR,KLOR-CON) 20 MEQ tablet Take 2 tablets (40 mEq total) by mouth daily. 12/10/15   Allegra Grana, FNP  sertraline (ZOLOFT) 25 MG tablet Take 25 mg by mouth daily.    Historical Provider, MD  traMADol Janean Sark) 50 MG tablet  10/27/15   Historical Provider, MD  triamcinolone cream (KENALOG) 0.1 % Apply topically 2 (two) times daily.    Historical Provider, MD     VITAL SIGNS:  Blood pressure 139/71, pulse 88, temperature 97.9 F (36.6 C), temperature source Oral, resp. rate 18, height 5\' 2"  (1.575 m), weight 117.9 kg (260 lb), SpO2 91 %.  PHYSICAL EXAMINATION:  Physical Exam  GENERAL:  70 y.o.-year-old patient lying in the bed with  no acute distress. Morbidly obese EYES: Pupils equal, round, reactive to light and accommodation. No scleral icterus. Extraocular muscles intact.  HEENT: Head atraumatic, normocephalic. Oropharynx and nasopharynx clear. No oropharyngeal erythema, moist oral mucosa  NECK:  Supple, no jugular venous distention. No thyroid enlargement, no tenderness.  LUNGS: Bibasilar crackles CARDIOVASCULAR: S1, S2 normal. No murmurs, rubs, or gallops.  ABDOMEN: Soft, nontender, nondistended. Bowel sounds present. No organomegaly or mass.  EXTREMITIES: No  cyanosis, or clubbing. + 2 pedal & radial pulses b/l.  B/L LE edema NEUROLOGIC: Cranial nerves II through XII are intact. No focal Motor or sensory deficits appreciated b/l PSYCHIATRIC: The patient is alert and oriented x 3. Good affect.  SKIN: No obvious rash, lesion, or ulcer.   Abdominal fold ulcers, clean based  LABORATORY PANEL:   CBC  Recent Labs Lab 12/10/15 1348  WBC 7.6  HGB 9.7*  HCT 29.7*  PLT 227   ------------------------------------------------------------------------------------------------------------------  Chemistries   Recent Labs Lab 12/10/15 1348  NA 136  K 3.1*  CL 87*  CO2 38*  GLUCOSE 213*  BUN 21*  CREATININE  1.26*  CALCIUM 8.6*  AST 46*  ALT 22  ALKPHOS 126  BILITOT 1.2   ------------------------------------------------------------------------------------------------------------------  Cardiac Enzymes  Recent Labs Lab 12/10/15 1348  TROPONINI 0.05*   ------------------------------------------------------------------------------------------------------------------  RADIOLOGY:  Dg Chest 2 View  Result Date: 12/10/2015 CLINICAL DATA:  Weakness for 1 week. Status post fall 4 days ago. Dizziness. EXAM: CHEST  2 VIEW COMPARISON:  PA and lateral chest 11/30/2015. Single-view of the chest 12/19/2013 and 08/22/2013. FINDINGS: There is marked cardiomegaly and mild interstitial edema. No pneumothorax. No effusion is identified. Aortic atherosclerosis is noted. No focal bony abnormality. IMPRESSION: Cardiomegaly and mild interstitial edema. Atherosclerosis. Electronically Signed   By: Drusilla Kanner M.D.   On: 12/10/2015 16:17   Ct Head Wo Contrast  Result Date: 12/10/2015 CLINICAL DATA:  Fall 3 days ago with head injury and bruising. Dizziness. Worsening lower extremity weakness for 1 week. Initial encounter. EXAM: CT HEAD WITHOUT CONTRAST TECHNIQUE: Contiguous axial images were obtained from the base of the skull through the vertex without intravenous contrast. COMPARISON:  10/29/2015 FINDINGS: Brain: No evidence of acute infarction, hemorrhage, hydrocephalus, extra-axial collection or mass lesion/mass effect. Mild diffuse cerebral atrophy chronic small vessel disease are stable in appearance. Vascular: No hyperdense vessel or unexpected calcification. Skull: Normal. Negative for fracture or focal lesion. Sinuses/Orbits: No acute finding. Other: None. IMPRESSION: No acute intracranial abnormality. Stable mild cerebral atrophy and chronic small vessel disease. Electronically Signed   By: Myles Rosenthal M.D.   On: 12/10/2015 15:52   Dg Hip Unilat W Or Wo Pelvis 2-3 Views Left  Result Date:  12/10/2015 CLINICAL DATA:  Left hip pain, possible injury EXAM: DG HIP (WITH OR WITHOUT PELVIS) 2-3V LEFT COMPARISON:  None. FINDINGS: Three views of the left hip submitted. No acute fracture or subluxation. Mild degenerative changes bilateral hip joints with superior acetabular spurring. Mild degenerative changes pubic symphysis. IMPRESSION: No acute fracture or subluxation.  Mild degenerative changes. Electronically Signed   By: Natasha Mead M.D.   On: 12/10/2015 16:17     IMPRESSION AND PLAN:   * Acute on chronic diastolic chf with acute hypoxic resp failure - IV Lasix, Beta blockers - Input and Output - Counseled to limit fluids and Salt - Monitor Bun/Cr and Potassium - Echo done earlier this year -Cardiology follow up after discharge  * Weakness and recurrent falls Has been a chronic chronic problem Worsened  recently with swelling in legs  * DM SSI Home meds Recent HBA1c 7  * Anemia of chronic disease Monitor  * HTN Home meds  * Abdominal fold pressure ulcers - clean based Consult wound team  * DVT prophylaxis Lovenox  Will benefit from [facility] RN/PT at discharge  All the records are reviewed and case discussed with ED provider. Management plans discussed with the patient, family and they are in agreement.  CODE STATUS: FULL CODE  TOTAL TIME TAKING CARE OF THIS PATIENT: 40 minutes.   Milagros Loll R M.D on 12/10/2015 at 4:56 PM  Between 7am to 6pm - Pager - [phone]  After 6pm go to [email or web address] - password [email or web address]  [facility]  Office  [phone]  CC: Primary care physician; Rennie Plowman, FNP  Note: This dictation was prepared with Dragon dictation along with smaller phrase technology. Any transcriptional errors that result from this process are unintentional.

## 2015-12-11 ENCOUNTER — Encounter: Payer: Self-pay | Admitting: *Deleted

## 2015-12-11 LAB — GLUCOSE, CAPILLARY
Glucose-Capillary: 67 mg/dL (ref 65–99)
Glucose-Capillary: 131 mg/dL — ABNORMAL HIGH (ref 65–99)
Glucose-Capillary: 72 mg/dL (ref 65–99)
Glucose-Capillary: 99 mg/dL (ref 65–99)
Glucose-Capillary: 134 mg/dL — ABNORMAL HIGH (ref 65–99)

## 2015-12-11 LAB — BASIC METABOLIC PANEL
Anion gap: 9 (ref 5–15)
BUN: 19 mg/dL (ref 6–20)
CHLORIDE: 91 mmol/L — AB (ref 101–111)
CO2: 41 mmol/L — AB (ref 22–32)
CREATININE: 1.06 mg/dL — AB (ref 0.44–1.00)
Calcium: 8.6 mg/dL — ABNORMAL LOW (ref 8.9–10.3)
GFR calc Af Amer: >60 mL/min (ref >60)
GFR calc non Af Amer: 52 mL/min — ABNORMAL LOW (ref >60)
Glucose, Bld: 43 mg/dL — CL (ref 65–99)
Potassium: 4.2 mmol/L (ref 3.5–5.1)
Sodium: 141 mmol/L (ref 135–145)

## 2015-12-11 NOTE — Progress Notes (Addendum)
Sugar dropped to 43 per lab draw. Orange juice given. Up to 67. More orange juice and milk given. Will continue to monitor. Notified Dr. Tobi Bastos via text/

## 2015-12-11 NOTE — Evaluation (Signed)
Physical Therapy Evaluation Patient Details Name: [name] MRN: [id number] DOB: Nov 09, 1945 Today's Date: 12/11/2015   History of Present Illness  Pt. is 69y.o female presented with progressive LE weakness and increased frequency of falls, imaging negative for injury. Hx: CHF, HTN, DM,SOB  Clinical Impression  Pt. Supine in bed upon arrival, alert and oriented. Pt. Demonstrates generalized weakness BUE 3/5 strength in shoulders symmetrically and 4/5strength in distal BUE. Pt. Demonstrates LLE weakness 4/5 <RLE 5/5 grossly. Pt. Requires assist for all mobility; mod A for bed mobility, min A for sit<>stand transfers with use of RW +2 for safety, able to ambulate approx. 21ft. Min A with use of RW +2 for safety and close chair follow as pt. Reports hx. Of needing to sit urgently "my legs give out". Pt. Demonstrates decreased level of mobility and increased level of assist relative to her baseline level of functioning in community with use of rollator. Pt. Was fatigued following activity from today's session,. pt. Is at significant risk of falls due to generalized weakness and reported hx. Of 17falls in previous 2 days prior to admission. 2L O2 utilized throughout session; pt. spO2 95% sitting EOB, spO2 92% following ambulation, increased quickly with rest. Would benefit from skilled PT to address above deficits and promote optimal return to PLOF Recommend SNF upon d/c to follow up with further skilled PT needs.     Follow Up Recommendations SNF    Equipment Recommendations       Recommendations for Other Services       Precautions / Restrictions Precautions Precautions: Fall (Pt. reports falling 4x in past 2days) Restrictions Weight Bearing Restrictions: No      Mobility  Bed Mobility Overal bed mobility: Needs Assistance Bed Mobility: Supine to Sit     Supine to sit: Mod assist     General bed mobility comments: Pt. able to initiate BLE and trunk movements with BUE assist from  bedrail and HOB elevated, requires mod A to fully transfer and position at EOB  Transfers Overall transfer level: Needs assistance Equipment used: Rolling walker (2 wheeled) Transfers: Sit to/from Stand Sit to Stand: Min assist         General transfer comment: Pt. pulled on RW with BUE  Ambulation/Gait Ambulation/Gait assistance: +2 safety/equipment;Min assist Ambulation Distance (Feet): 10 Feet Assistive device: Rolling walker (2 wheeled)       General Gait Details: Pt. demonstrates slow cadence, wide BOS, step to pattern, requires chair follow for safety   Stairs            Wheelchair Mobility    Modified Rankin (Stroke Patients Only)       Balance Overall balance assessment: Needs assistance Sitting-balance support: Feet supported Sitting balance-Leahy Scale: Fair     Standing balance support: Bilateral upper extremity supported Standing balance-Leahy Scale: Fair                               Pertinent Vitals/Pain Pain Assessment: No/denies pain    Home Living Family/patient expects to be discharged to:: Private residence Living Arrangements: Alone Available Help at Discharge: Personal care attendant;Available 24 hours/day (Pt. report she has assistance/supervision approx. 12hr/day currently and could arrange 24/7 care if necessary ) Type of Home: House Home Access: Stairs to enter Entrance Stairs-Rails: None Entrance Stairs-Number of Steps: 1 Home Layout: One level Home Equipment: Walker - 4 wheels;Walker - standard (lift chair-pt. sleeps in )  Prior Function Level of Independence: Independent with assistive device(s);Needs assistance   Gait / Transfers Assistance Needed: Pt. uses 4WW at baseline   ADL's / Homemaking Assistance Needed: Pt. has assistance approx. 12hr/daily to assist with ADL's         Hand Dominance        Extremity/Trunk Assessment   Upper Extremity Assessment: Generalized weakness (Pt. demonstrates  grossly 3/5 strength in shoulders and grossly 4/5 strength at elbows symmetrical )           Lower Extremity Assessment: RLE deficits/detail;LLE deficits/detail RLE Deficits / Details: RLE grossly 5/5 strength  LLE Deficits / Details: LLE grossly 4/5 strength     Communication   Communication: No difficulties  Cognition Arousal/Alertness: Awake/alert Behavior During Therapy: WFL for tasks assessed/performed Overall Cognitive Status: Within Functional Limits for tasks assessed                      General Comments      Exercises     Assessment/Plan    PT Assessment Patient needs continued PT services  PT Problem List Decreased strength;Decreased balance;Decreased mobility;Decreased activity tolerance;Decreased knowledge of use of DME;Decreased safety awareness          PT Treatment Interventions DME instruction;Gait training;Stair training;Therapeutic activities;Therapeutic exercise;Neuromuscular re-education;Functional mobility training;Balance training;Patient/family education    PT Goals (Current goals can be found in the Care Plan section)  Acute Rehab PT Goals Patient Stated Goal: Pt. would like to get stronger PT Goal Formulation: With patient Time For Goal Achievement: 12/25/15 Potential to Achieve Goals: Fair    Frequency Min 2X/week   Barriers to discharge        Co-evaluation               End of Session Equipment Utilized During Treatment: Gait belt;Oxygen Activity Tolerance: Patient limited by fatigue;Patient tolerated treatment well Patient left: in chair;with call bell/phone within reach;with chair alarm set;with family/visitor present (daughter present) Nurse Communication: Mobility status         Time: 8295-6213 PT Time Calculation (min) (ACUTE ONLY): 28 min   Charges:         PT G Codes:         Ron Junco, SPT  12/11/15,11:26 AM

## 2015-12-11 NOTE — Telephone Encounter (Signed)
Pt caregiver called to inform that pt was admitted into the hospital yesterday 12/10/15. Thank you!  Call [facility] @ [phone]

## 2015-12-11 NOTE — Care Management Important Message (Signed)
Important Message  Patient Details  Name: ETHLYN ALTO MRN: [id number] Date of Birth: 03-Dec-1945   Medicare Important Message Given:  Yes    Eber Hong, RN 12/11/2015, 5:34 PM

## 2015-12-11 NOTE — Consult Note (Signed)
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  Seen at [facility] in [place]. Currently treating with vaseline gauze.   Wound type:ID to pannus  Pressure Ulcer POA: Yes Measurement:LEft flank 2 cm x 2 cm x 0.2 cm  Mid abdominal pannus 1 cm x 1 cm x 0.1 cm  Erythema and scattered denuded skin under abdominal pannus Wound HWE:XHBZ and moist Drainage (amount, consistency, odor) Minimal serosanguinous  Musty odor.  Periwound:Erythema, sweat and moisture present Dressing procedure/placement/frequency:Cleanse under abdominal pannus with soap and water and pat gently dry.  Interdry Ag to nonintact skin and under pannus Measure and cut length of InterDry Ag+ to fit in skin folds that have skin breakdown Tuck InterDry  Ag+ fabric into skin folds in a single layer, allow for 2 inches of overhang from skin edges to allow for wicking to occur May remove to bathe; dry area thoroughly and then tuck into affected areas again Do not apply any creams or ointments when using InterDry Ag+ DO NOT THROW AWAY FOR 5 DAYS unless soiled with stool DO NOT [facility] product, this will inactivate the silver in the material  New sheet of Interdry Ag+ should be applied after 5 days of use if patient continues to have skin breakdown  Will not follow at this time.  Please re-consult if needed.  Maple Hudson RN BSN CWON Pager [phone]

## 2015-12-11 NOTE — Care Management (Signed)
Patient admitted with congestive heart failure.  Onset was in January 2017 and patient says she has been going down hill since then .  She has round the clock help in her home and walker and wheelchair.  She does not currently have home 02.  Discussed the need to assess for need of home 02 when  patient's condition.  PT consult recommending skilled nursing but this could change.  Heads up referral to [facility] in the event patient is able to dc home

## 2015-12-11 NOTE — Progress Notes (Signed)
Wound care.  Skin under abdominal fold very wet and has numerous open areas that were likely blisters.  Area dried and interdry applied to area, with about 2 inches beyond the folds for wicking.

## 2015-12-11 NOTE — Progress Notes (Signed)
[facility] -  at [facility]   PATIENT NAME: [name]    MRN#:  [id number]  DATE OF BIRTH:  03-01-1945  SUBJECTIVE:  Hospital Day: 1 day [name] is a 70 y.o. female presenting with Weakness and Leg Pain .   Overnight events: No acute overnight events Interval Events: No complaints this morning states breathing is better still has some edema  REVIEW OF SYSTEMS:  CONSTITUTIONAL: No fever, fatigue or weakness.  EYES: No blurred or double vision.  EARS, NOSE, AND THROAT: No tinnitus or ear pain.  RESPIRATORY: No cough,Positive shortness of breath, denies wheezing or hemoptysis.  CARDIOVASCULAR: No chest pain, orthopnea, positive edema.  GASTROINTESTINAL: No nausea, vomiting, diarrhea or abdominal pain.  GENITOURINARY: No dysuria, hematuria.  ENDOCRINE: No polyuria, nocturia,  HEMATOLOGY: No anemia, easy bruising or bleeding SKIN: No rash or lesion. MUSCULOSKELETAL: No joint pain or arthritis.   NEUROLOGIC: No tingling, numbness, weakness.  PSYCHIATRY: No anxiety or depression.   DRUG ALLERGIES:   Allergies  Allergen Reactions  . Codeine Other (See Comments)    Reaction:  Dizziness   . Liraglutide     Other reaction(s): nausea and stomach pain  . Wheat Bran Other (See Comments)    Reaction:  Sneezing     VITALS:  Blood pressure (!) 121/43, pulse 91, temperature 97.9 F (36.6 C), temperature source Oral, resp. rate 18, height 5\' 2"  (1.575 m), weight 117.3 kg (258 lb 11.2 oz), SpO2 92 %.  PHYSICAL EXAMINATION:  VITAL SIGNS: Vitals:   12/11/15 0524 12/11/15 0806  BP: (!) 122/50 (!) 121/43  Pulse: 78 91  Resp: 16 18  Temp: 97.8 F (36.6 C) 97.9 F (36.6 C)   GENERAL:69 y.o.female currently in no acute distress.  HEAD: Normocephalic, atraumatic.  EYES: Pupils equal, round, reactive to light. Extraocular muscles intact. No scleral icterus.  MOUTH: Moist mucosal membrane. Dentition intact. No abscess noted.  EAR, NOSE,  THROAT: Clear without exudates. No external lesions.  NECK: Supple. No thyromegaly. No nodules. No JVD.  PULMONARY: Diminished breath sounds throughout without wheeze rails or rhonci. No use of accessory muscles, Good respiratory effort. good air entry bilaterally CHEST: Nontender to palpation.  CARDIOVASCULAR: S1 and S2. Regular rate and rhythm. No murmurs, rubs, or gallops. 2+ edema. Pedal pulses 2+ bilaterally.  GASTROINTESTINAL: Soft, nontender, nondistended. No masses. Positive bowel sounds. No hepatosplenomegaly.  MUSCULOSKELETAL: No swelling, clubbing, or edema. Range of motion full in all extremities.  NEUROLOGIC: Cranial nerves II through XII are intact. No gross focal neurological deficits. Sensation intact. Reflexes intact.  SKIN: No ulceration, lesions, rashes, or cyanosis. Skin warm and dry. Turgor intact.  PSYCHIATRIC: Mood, affect within normal limits. The patient is awake, alert and oriented x 3. Insight, judgment intact.      LABORATORY PANEL:   CBC  Recent Labs Lab 12/10/15 1348  WBC 7.6  HGB 9.7*  HCT 29.7*  PLT 227   ------------------------------------------------------------------------------------------------------------------  Chemistries   Recent Labs Lab 12/10/15 1348 12/11/15 0442  NA 136 141  K 3.1* 4.2  CL 87* 91*  CO2 38* 41*  GLUCOSE 213* 43*  BUN 21* 19  CREATININE 1.26* 1.06*  CALCIUM 8.6* 8.6*  AST 46*  --   ALT 22  --   ALKPHOS 126  --   BILITOT 1.2  --    ------------------------------------------------------------------------------------------------------------------  Cardiac Enzymes  Recent Labs Lab 12/10/15 1348  TROPONINI 0.05*   ------------------------------------------------------------------------------------------------------------------  RADIOLOGY:  Dg Chest 2 View  Result Date: 12/10/2015  CLINICAL DATA:  Weakness for 1 week. Status post fall 4 days ago. Dizziness. EXAM: CHEST  2 VIEW COMPARISON:  PA and  lateral chest 11/30/2015. Single-view of the chest 12/19/2013 and 08/22/2013. FINDINGS: There is marked cardiomegaly and mild interstitial edema. No pneumothorax. No effusion is identified. Aortic atherosclerosis is noted. No focal bony abnormality. IMPRESSION: Cardiomegaly and mild interstitial edema. Atherosclerosis. Electronically Signed   By: Drusilla Kanner M.D.   On: 12/10/2015 16:17   Ct Head Wo Contrast  Result Date: 12/10/2015 CLINICAL DATA:  Fall 3 days ago with head injury and bruising. Dizziness. Worsening lower extremity weakness for 1 week. Initial encounter. EXAM: CT HEAD WITHOUT CONTRAST TECHNIQUE: Contiguous axial images were obtained from the base of the skull through the vertex without intravenous contrast. COMPARISON:  10/29/2015 FINDINGS: Brain: No evidence of acute infarction, hemorrhage, hydrocephalus, extra-axial collection or mass lesion/mass effect. Mild diffuse cerebral atrophy chronic small vessel disease are stable in appearance. Vascular: No hyperdense vessel or unexpected calcification. Skull: Normal. Negative for fracture or focal lesion. Sinuses/Orbits: No acute finding. Other: None. IMPRESSION: No acute intracranial abnormality. Stable mild cerebral atrophy and chronic small vessel disease. Electronically Signed   By: Myles Rosenthal M.D.   On: 12/10/2015 15:52   Dg Hip Unilat W Or Wo Pelvis 2-3 Views Left  Result Date: 12/10/2015 CLINICAL DATA:  Left hip pain, possible injury EXAM: DG HIP (WITH OR WITHOUT PELVIS) 2-3V LEFT COMPARISON:  None. FINDINGS: Three views of the left hip submitted. No acute fracture or subluxation. Mild degenerative changes bilateral hip joints with superior acetabular spurring. Mild degenerative changes pubic symphysis. IMPRESSION: No acute fracture or subluxation.  Mild degenerative changes. Electronically Signed   By: Natasha Mead M.D.   On: 12/10/2015 16:17    EKG:   Orders placed or performed during the hospital encounter of 12/10/15  . ED  EKG  . ED EKG    ASSESSMENT AND PLAN:   [name] is a 70 y.o. female presenting with Weakness and Leg Pain . Admitted 12/10/2015 : Day #: 1 day 1. Acute on chronic diastolic congestive heart failure, acute hypoxic respiratory failure: Continue IV diuresis patient follows with [facility] clinic cardiology will consult them otherwise continue current medications, supplemental oxygen to keep saturations greater than 92% 2. Recurrent falls : Physical therapy evaluation 3. Type 2 diabetes insulin requiring hold oral agents continue sliding scale coverage continue basal insulin 4. Essential hypertension continue medications  Disposition: Physical therapy evaluation  All the records are reviewed and case discussed with Care Management/Social Workerr. Management plans discussed with the patient, family and they are in agreement.  CODE STATUS: full TOTAL TIME TAKING CARE OF THIS PATIENT: 28 minutes.   POSSIBLE D/C IN 1-2DAYS, DEPENDING ON CLINICAL CONDITION.   Grey Rakestraw,  Mardi Mainland.D on 12/11/2015 at 11:03 AM  Between 7am to 6pm - Pager - [phone]  After 6pm: House Pager: - [phone]  [facility]  Office  [phone]  CC: Primary care physician; Rennie Plowman, FNP

## 2015-12-11 NOTE — Progress Notes (Addendum)
Inpatient Diabetes Program Recommendations  AACE/ADA: New Consensus Statement on Inpatient Glycemic Control (2015)  Target Ranges:  Prepandial:   less than 140 mg/dL      Peak postprandial:   less than 180 mg/dL (1-2 hours)      Critically ill patients:  140 - 180 mg/dL   Lab Results  Component Value Date   GLUCAP 131 (H) 12/11/2015   HGBA1C 7.0 (H) 12/04/2015    Review of Glycemic Control:  Results for KAROLYN, MESSING (MRN [id number]) as of 12/11/2015 09:46  Ref. Range 03/15/2015 11:31 12/10/2015 18:42 12/10/2015 21:15 12/11/2015 06:03 12/11/2015 07:18  Glucose-Capillary Latest Ref Range: 65 - 99 mg/dL 833 (H) 825 (H) 053 (H) 67 131 (H)    Diabetes history: Type 2 diabetes Outpatient Diabetes medications: Lantus 10 units bid, Amaryl 2 mg bid, Metformin XR 1000 mg bid Current orders for Inpatient glycemic control:  Lantus 10 units bid, Amaryl 2 mg bid, Novolog moderate tid with meals and HS, Metformin XR 1000 mg bid  Inpatient Diabetes Program Recommendations:   Note low blood sugar this morning. May consider d/c of Amaryl while patient is in the hospital.   Thanks,  Beryl Meager, RN, BC-ADM Inpatient Diabetes Coordinator Pager [phone] (8a-5p)

## 2015-12-12 LAB — GLUCOSE, CAPILLARY
GLUCOSE-CAPILLARY: 67 mg/dL (ref 65–99)
GLUCOSE-CAPILLARY: 93 mg/dL (ref 65–99)
Glucose-Capillary: 172 mg/dL — ABNORMAL HIGH (ref 65–99)
Glucose-Capillary: 338 mg/dL — ABNORMAL HIGH (ref 65–99)
Glucose-Capillary: 283 mg/dL — ABNORMAL HIGH (ref 65–99)

## 2015-12-12 MED ORDER — INSULIN GLARGINE 100 UNIT/ML ~~LOC~~ SOLN
8.0000 [IU] | Freq: Two times a day (BID) | SUBCUTANEOUS | Status: DC
  Administered 2015-12-12 – 2015-12-14 (×4): 8 [IU] via SUBCUTANEOUS
  Filled 2015-12-12 (×6): qty 0.08

## 2015-12-12 NOTE — Progress Notes (Signed)
[facility] - [place] at [facility]   PATIENT NAME: [name]    MRN#:  [id number]  DATE OF BIRTH:  March 13, 1945  SUBJECTIVE:  Hospital Day: 2 days [name] is a 70 y.o. female presenting with Weakness and Leg Pain  Continues to have some shortness of breath although better. Lower extremity swelling is improved. Still weak.  Was able to take only a few steps with physical therapy.  2 L oxygen.  REVIEW OF SYSTEMS:  CONSTITUTIONAL: No fever, fatigue or weakness.  EYES: No blurred or double vision.  EARS, NOSE, AND THROAT: No tinnitus or ear pain.  RESPIRATORY: No cough,Positive shortness of breath, denies wheezing or hemoptysis.  CARDIOVASCULAR: No chest pain, orthopnea, positive edema.  GASTROINTESTINAL: No nausea, vomiting, diarrhea or abdominal pain.  GENITOURINARY: No dysuria, hematuria.  ENDOCRINE: No polyuria, nocturia,  HEMATOLOGY: No anemia, easy bruising or bleeding SKIN: No rash or lesion. MUSCULOSKELETAL: No joint pain or arthritis.   NEUROLOGIC: No tingling, numbness, weakness.  PSYCHIATRY: No anxiety or depression.   DRUG ALLERGIES:   Allergies  Allergen Reactions  . Codeine Other (See Comments)    Reaction:  Dizziness   . Liraglutide     Other reaction(s): nausea and stomach pain  . Wheat Bran Other (See Comments)    Reaction:  Sneezing     VITALS:  Blood pressure (!) 128/59, pulse 84, temperature 97.9 F (36.6 C), temperature source Oral, resp. rate 18, height 5\' 2"  (1.575 m), weight 116.2 kg (256 lb 1.6 oz), SpO2 97 %.  PHYSICAL EXAMINATION:  VITAL SIGNS: Vitals:   12/12/15 0839 12/12/15 1139  BP: (!) 137/51 (!) 128/59  Pulse: 86 84  Resp:  18  Temp: 98 F (36.7 C) 97.9 F (36.6 C)   GENERAL:69 y.o.female currently in no acute distress. Morbidly obese HEAD: Normocephalic, atraumatic.  EYES: Pupils equal, round, reactive to light. Extraocular muscles intact. No scleral icterus.  MOUTH: Moist mucosal membrane.  Dentition intact. No abscess noted.  EAR, NOSE, THROAT: Clear without exudates. No external lesions.  NECK: Supple. No thyromegaly. No nodules. No JVD.  PULMONARY: Diminished breath sounds throughout without wheeze or rhonci. No use of accessory muscles, Good respiratory effort. Bilateral crackles. CHEST: Nontender to palpation.  CARDIOVASCULAR: S1 and S2. Regular rate and rhythm. No murmurs, rubs, or gallops. 2+ edema. Pedal pulses 2+ bilaterally.  GASTROINTESTINAL: Soft, nontender, nondistended. No masses. Positive bowel sounds. No hepatosplenomegaly.  MUSCULOSKELETAL: No swelling, clubbing, or edema. Range of motion full in all extremities.  NEUROLOGIC: Cranial nerves II through XII are intact. No gross focal neurological deficits. Sensation intact. Reflexes intact.  SKIN: No ulceration, lesions, rashes, or cyanosis. Skin warm and dry. Turgor intact.  PSYCHIATRIC: Mood, affect within normal limits. The patient is awake, alert and oriented x 3. Insight, judgment intact.   LABORATORY PANEL:   CBC  Recent Labs Lab 12/10/15 1348  WBC 7.6  HGB 9.7*  HCT 29.7*  PLT 227   ------------------------------------------------------------------------------------------------------------------  Chemistries   Recent Labs Lab 12/10/15 1348 12/11/15 0442  NA 136 141  K 3.1* 4.2  CL 87* 91*  CO2 38* 41*  GLUCOSE 213* 43*  BUN 21* 19  CREATININE 1.26* 1.06*  CALCIUM 8.6* 8.6*  AST 46*  --   ALT 22  --   ALKPHOS 126  --   BILITOT 1.2  --    ------------------------------------------------------------------------------------------------------------------  Cardiac Enzymes  Recent Labs Lab 12/10/15 1348  TROPONINI 0.05*   ------------------------------------------------------------------------------------------------------------------  RADIOLOGY:  Dg Chest 2  View  Result Date: 12/10/2015 CLINICAL DATA:  Weakness for 1 week. Status post fall 4 days ago. Dizziness. EXAM: CHEST  2  VIEW COMPARISON:  PA and lateral chest 11/30/2015. Single-view of the chest 12/19/2013 and 08/22/2013. FINDINGS: There is marked cardiomegaly and mild interstitial edema. No pneumothorax. No effusion is identified. Aortic atherosclerosis is noted. No focal bony abnormality. IMPRESSION: Cardiomegaly and mild interstitial edema. Atherosclerosis. Electronically Signed   By: [name] M.D.   On: 12/10/2015 16:17   Ct Head Wo Contrast  Result Date: 12/10/2015 CLINICAL DATA:  Fall 3 days ago with head injury and bruising. Dizziness. Worsening lower extremity weakness for 1 week. Initial encounter. EXAM: CT HEAD WITHOUT CONTRAST TECHNIQUE: Contiguous axial images were obtained from the base of the skull through the vertex without intravenous contrast. COMPARISON:  10/29/2015 FINDINGS: Brain: No evidence of acute infarction, hemorrhage, hydrocephalus, extra-axial collection or mass lesion/mass effect. Mild diffuse cerebral atrophy chronic small vessel disease are stable in appearance. Vascular: No hyperdense vessel or unexpected calcification. Skull: Normal. Negative for fracture or focal lesion. Sinuses/Orbits: No acute finding. Other: None. IMPRESSION: No acute intracranial abnormality. Stable mild cerebral atrophy and chronic small vessel disease. Electronically Signed   By: [name] M.D.   On: 12/10/2015 15:52   Dg Hip Unilat W Or Wo Pelvis 2-3 Views Left  Result Date: 12/10/2015 CLINICAL DATA:  Left hip pain, possible injury EXAM: DG HIP (WITH OR WITHOUT PELVIS) 2-3V LEFT COMPARISON:  None. FINDINGS: Three views of the left hip submitted. No acute fracture or subluxation. Mild degenerative changes bilateral hip joints with superior acetabular spurring. Mild degenerative changes pubic symphysis. IMPRESSION: No acute fracture or subluxation.  Mild degenerative changes. Electronically Signed   By: [name] M.D.   On: 12/10/2015 16:17    EKG:   Orders placed or performed during the hospital  encounter of 12/10/15  . ED EKG  . ED EKG    ASSESSMENT AND PLAN:   [name] is a 70 y.o. female presenting with Weakness and Leg Pain . Admitted 12/10/2015 : Day #: 2 days   1. Acute on chronic diastolic congestive heart failure with acute hypoxic respiratory failure: Due to indiscretion with salt intake and fluids. Continue IV Lasix. Monitor input and output with daily weight. Check labs tomorrow morning. Counseled again regarding salt and fluid intake. Wean oxygen as tolerated.  2. Recurrent falls Progressively worsening and chronic problem.  3. Type 2 diabetes insulin  Hypoglycemic episodes. Oral hypoglycemics discontinued. Continue insulin. Sliding scale insulin.  4. Essential hypertension continue medications  He will need skilled nursing facility at discharge.  All the records are reviewed and case discussed with Care Management/Social Workerr. Management plans discussed with the patient, family and they are in agreement.  CODE STATUS: full TOTAL TIME TAKING CARE OF THIS PATIENT: 35 minutes.   POSSIBLE D/C IN 1-2DAYS, DEPENDING ON CLINICAL CONDITION.   Milagros Loll R M.D on 12/12/2015 at 12:26 PM  Between 7am to 6pm - Pager - [phone]  After 6pm: House Pager: - [phone]  [facility]  Office  [phone]  CC: Primary care physician; Rennie Plowman, FNP

## 2015-12-12 NOTE — Consult Note (Signed)
[facility] Cardiology  CARDIOLOGY CONSULT NOTE  Patient ID: [name] MRN: [id number] DOB/AGE: 1945-03-14 70 y.o.  Admit date: 12/10/2015 Referring Physician Sudini Primary Physician Arnett Primary Cardiologist [facility] Reason for Consultation Congestive heart failure  HPI: 70 year old female referred for evaluation of acute on chronic diastolic congestive heart failure. The patient has a history of essential hypertension, chronic diastolic congestive heart failure, diabetes and obesity. She presented to [facility] emergency room with aggressive exertional dyspnea and peripheral edema despite outpatient diuretic therapy. The patient apparently has been eating salted cashews insulted popcorn daily. Chest x-ray revealed interstitial edema.  Review of systems complete and found to be negative unless listed above     Past Medical History:  Diagnosis Date  . Anemia   . Asthma   . CVA (cerebral vascular accident) (HCC)   . Diabetes mellitus without complication (HCC)   . GERD (gastroesophageal reflux disease)   . Heart murmur   . History of hiatal hernia   . Hypertension   . Panic attacks   . Peripheral vascular disease (HCC)   . Restless leg syndrome   . Shortness of breath dyspnea     Past Surgical History:  Procedure Laterality Date  . ABDOMINAL HYSTERECTOMY    . BREAST BIOPSY Right yrs ago   benign  . ENDARTERECTOMY Right 07/24/2014   Procedure: ENDARTERECTOMY CAROTID;  Surgeon: Annice Needy, MD;  Location: [facility] ORS;  Service: Vascular;  Laterality: Right;  . EYE SURGERY Left    cataract  . FRACTURE SURGERY Left    fractured ankle  . TONSILLECTOMY      Prescriptions Prior to Admission  Medication Sig Dispense Refill Last Dose  . clobetasol ointment (TEMOVATE) 0.05 % Apply to rash twice daily   12/10/2015 at Unknown time  . cyanocobalamin (,VITAMIN B-12,) 1000 MCG/ML injection 1000 mcg (1 mg) injection once per week for four weeks, followed by 1000 mcg injection once per month.  (Patient taking differently: Inject 1,000 mcg into the skin every 30 (thirty) days. *Inject in shoulder*) 1 mL 9 11/29/2015  . dapsone 25 MG tablet Take 25 mg by mouth daily.   12/10/2015 at Unknown time  . diclofenac (VOLTAREN) 50 MG EC tablet Take 50 mg by mouth 2 (two) times daily as needed for moderate pain.   12/10/2015 at Unknown time  . ferrous sulfate 325 (65 FE) MG tablet Take 1 tablet (325 mg total) by mouth daily with breakfast. 30 tablet 3 12/10/2015 at Unknown time  . fluconazole (DIFLUCAN) 150 MG tablet Take 150 mg by mouth every morning. For 3 weeks   12/10/2015 at Unknown time  . furosemide (LASIX) 40 MG tablet Take 1 tablet (40 mg total) by mouth 2 (two) times daily. (Patient taking differently: Take 40 mg by mouth 3 (three) times daily. ) 60 tablet 0 12/10/2015 at Unknown time  . gabapentin (NEURONTIN) 800 MG tablet Take 800 mg by mouth 2 (two) times daily.   12/10/2015 at Unknown time  . glimepiride (AMARYL) 2 MG tablet Take 2 mg by mouth 2 (two) times daily.   12/10/2015 at Unknown time  . hydrochlorothiazide (HYDRODIURIL) 12.5 MG tablet Take 12.5 mg by mouth 2 (two) times daily.    12/10/2015 at Unknown time  . insulin aspart (NOVOLOG) 100 UNIT/ML injection Inject 15 Units into the skin 2 (two) times daily.    12/10/2015 at Unknown time  . insulin glargine (LANTUS) 100 UNIT/ML injection Inject 10 Units into the skin 2 (two) times daily. Surveyor, quantity and Lunch)  12/09/2015 at Unknown time  . loratadine (CLARITIN) 10 MG tablet Take 10 mg by mouth daily as needed for allergies.    Past Month at Unknown time  . lovastatin (MEVACOR) 20 MG tablet Take 20 mg by mouth at bedtime.   12/09/2015 at Unknown time  . metFORMIN (GLUCOPHAGE-XR) 500 MG 24 hr tablet Take 1,000 mg by mouth 2 (two) times daily.   12/10/2015 at Unknown time  . naproxen (NAPROSYN) 500 MG tablet Take 500 mg by mouth at bedtime.    12/09/2015 at Unknown time  . omeprazole (PRILOSEC) 40 MG capsule Take 40 mg by mouth at  bedtime.    12/09/2015 at Unknown time  . oxyCODONE-acetaminophen (PERCOCET) 5-325 MG tablet Take 1 tablet by mouth every 8 (eight) hours as needed for moderate pain or severe pain. 30 tablet 0 Past Month at Unknown time  . potassium chloride SA (K-DUR,KLOR-CON) 20 MEQ tablet Take 2 tablets (40 mEq total) by mouth daily. 30 tablet 2 haven't started  . triamcinolone (KENALOG) 0.025 % cream Apply 1 application topically every Monday, Wednesday, and Friday.   12/09/2015  . aspirin EC 81 MG tablet Take 81 mg by mouth daily.   Taking  . cephALEXin (KEFLEX) 500 MG capsule   0 Completed Course at Unknown time  . mupirocin cream (BACTROBAN) 2 % Apply 1 application topically 3 (three) times daily. (Patient not taking: Reported on 12/10/2015) 15 g 1 Not Taking at Unknown time  . mupirocin ointment (BACTROBAN) 2 % Place 1 application into the nose 2 (two) times daily. (Patient not taking: Reported on 12/10/2015) 22 g 0 Not Taking at Unknown time   Social History   Social History  . Marital status: Widowed    Spouse name: N/A  . Number of children: N/A  . Years of education: N/A   Occupational History  . Not on file.   Social History Main Topics  . Smoking status: Never Smoker  . Smokeless tobacco: Never Used  . Alcohol use No  . Drug use: No  . Sexual activity: Not on file   Other Topics Concern  . Not on file   Social History Narrative  . No narrative on file    Family History  Problem Relation Age of Onset  . Pancreatic cancer Mother   . CAD Father   . Hypertension Father   . Pancreatic cancer Father   . CAD Brother   . Breast cancer Maternal Aunt     pt states several maternal aunts      Review of systems complete and found to be negative unless listed above      PHYSICAL EXAM  General: Well developed, well nourished, in no acute distress HEENT:  Normocephalic and atramatic Neck:  No JVD.  Lungs: Clear bilaterally to auscultation and percussion. Heart: HRRR . Normal S1  and S2 without gallops or murmurs.  Abdomen: Bowel sounds are positive, abdomen soft and non-tender  Msk:  Back normal, normal gait. Normal strength and tone for age. Extremities: No clubbing, cyanosis or edema.   Neuro: Alert and oriented X 3. Psych:  Good affect, responds appropriately  Labs:   Lab Results  Component Value Date   WBC 7.6 12/10/2015   HGB 9.7 (L) 12/10/2015   HCT 29.7 (L) 12/10/2015   MCV 80.5 12/10/2015   PLT 227 12/10/2015    Recent Labs Lab 12/10/15 1348 12/11/15 0442  NA 136 141  K 3.1* 4.2  CL 87* 91*  CO2 38* 41*  BUN 21* 19  CREATININE 1.26* 1.06*  CALCIUM 8.6* 8.6*  PROT 6.9  --   BILITOT 1.2  --   ALKPHOS 126  --   ALT 22  --   AST 46*  --   GLUCOSE 213* 43*   Lab Results  Component Value Date   CKMB 1.2 12/20/2013   TROPONINI 0.05 (HH) 12/10/2015    Lab Results  Component Value Date   CHOL 151 11/03/2015   CHOL 127 12/20/2013   Lab Results  Component Value Date   HDL 49.70 11/03/2015   HDL 48 12/20/2013   Lab Results  Component Value Date   LDLCALC 73 11/03/2015   LDLCALC 59 12/20/2013   Lab Results  Component Value Date   TRIG 142.0 11/03/2015   TRIG 101 12/20/2013   Lab Results  Component Value Date   CHOLHDL 3 11/03/2015   No results found for: LDLDIRECT    Radiology: Dg Chest 2 View  Result Date: 12/10/2015 CLINICAL DATA:  Weakness for 1 week. Status post fall 4 days ago. Dizziness. EXAM: CHEST  2 VIEW COMPARISON:  PA and lateral chest 11/30/2015. Single-view of the chest 12/19/2013 and 08/22/2013. FINDINGS: There is marked cardiomegaly and mild interstitial edema. No pneumothorax. No effusion is identified. Aortic atherosclerosis is noted. No focal bony abnormality. IMPRESSION: Cardiomegaly and mild interstitial edema. Atherosclerosis. Electronically Signed   By: Drusilla Kanner M.D.   On: 12/10/2015 16:17   Dg Chest 2 View  Result Date: 11/30/2015 CLINICAL DATA:  Lower extremity edema and history of CHF. EXAM:  CHEST  2 VIEW COMPARISON:  11/15/2015 FINDINGS: Stable mild cardiac enlargement. Stable chronic interstitial and pulmonary vascular prominence which may reflect chronic interstitial edema. There is no evidence of focal airspace consolidation, pneumothorax, nodule or pleural fluid. Bony structures are unremarkable. IMPRESSION: Suspect component of chronic interstitial edema. Electronically Signed   By: Irish Lack M.D.   On: 11/30/2015 17:10   Dg Chest 2 View  Result Date: 11/15/2015 CLINICAL DATA:  States felt dizzy on Friday morning and fell onto right side. Fell onto floor. C/O right rib pain anteriorly and radiates posteriorly. Hx of asthma, diabetes, heart murmur, and HTN. Nonsmoker. Hx of right breast biopsy EXAM: CHEST  2 VIEW COMPARISON:  10/29/2015 FINDINGS: Normal mediastinum and cardiac silhouette. Chronic central bronchitic markings. Normal pulmonary vasculature. No effusion, infiltrate, or pneumothorax. IMPRESSION: Chronic bronchitic markings. No clear acute cardiopulmonary process. Electronically Signed   By: Genevive Bi M.D.   On: 11/15/2015 14:01   Dg Ribs Unilateral Right  Result Date: 11/15/2015 CLINICAL DATA:  Recent fall .  RIGHT-sided pain. EXAM: RIGHT RIBS - 2 VIEW COMPARISON:  None. FINDINGS: Minimally displace fractures of 2 adjacent anterior RIGHT ribs (probable fourth and fifth ribs). No pneumothorax. IMPRESSION: Fracture of adjacent anterior RIGHT ribs.  No pneumothorax . Electronically Signed   By: Genevive Bi M.D.   On: 11/15/2015 15:12   Ct Head Wo Contrast  Result Date: 12/10/2015 CLINICAL DATA:  Fall 3 days ago with head injury and bruising. Dizziness. Worsening lower extremity weakness for 1 week. Initial encounter. EXAM: CT HEAD WITHOUT CONTRAST TECHNIQUE: Contiguous axial images were obtained from the base of the skull through the vertex without intravenous contrast. COMPARISON:  10/29/2015 FINDINGS: Brain: No evidence of acute infarction, hemorrhage,  hydrocephalus, extra-axial collection or mass lesion/mass effect. Mild diffuse cerebral atrophy chronic small vessel disease are stable in appearance. Vascular: No hyperdense vessel or unexpected calcification. Skull: Normal. Negative for fracture or focal lesion.  Sinuses/Orbits: No acute finding. Other: None. IMPRESSION: No acute intracranial abnormality. Stable mild cerebral atrophy and chronic small vessel disease. Electronically Signed   By: Myles Rosenthal M.D.   On: 12/10/2015 15:52   Dg Hip Unilat W Or Wo Pelvis 2-3 Views Left  Result Date: 12/10/2015 CLINICAL DATA:  Left hip pain, possible injury EXAM: DG HIP (WITH OR WITHOUT PELVIS) 2-3V LEFT COMPARISON:  None. FINDINGS: Three views of the left hip submitted. No acute fracture or subluxation. Mild degenerative changes bilateral hip joints with superior acetabular spurring. Mild degenerative changes pubic symphysis. IMPRESSION: No acute fracture or subluxation.  Mild degenerative changes. Electronically Signed   By: Natasha Mead M.D.   On: 12/10/2015 16:17    EKG: Normal sinus rhythm  ASSESSMENT AND PLAN:   1. Acute on chronic diastolic congestive heart failure due to dietary noncompliance 2. Essential hypertension  Recommendations  1. Continue diuresis 2. Carefully monitor renal status 3. Stressed the importance of low sodium, no added salt diet  Signed: Jerard Bays MD,PhD, [facility] 12/12/2015, 10:02 AM

## 2015-12-13 ENCOUNTER — Encounter: Payer: Self-pay | Admitting: Family

## 2015-12-13 DIAGNOSIS — R296 Repeated falls: Secondary | ICD-10-CM | POA: Insufficient documentation

## 2015-12-13 LAB — BASIC METABOLIC PANEL
ANION GAP: 5 (ref 5–15)
BUN: 13 mg/dL (ref 6–20)
CALCIUM: 8.7 mg/dL — AB (ref 8.9–10.3)
CHLORIDE: 91 mmol/L — AB (ref 101–111)
CO2: 44 mmol/L — AB (ref 22–32)
Creatinine, Ser: 0.84 mg/dL (ref 0.44–1.00)
GFR calc non Af Amer: >60 mL/min (ref >60)
Glucose, Bld: 111 mg/dL — ABNORMAL HIGH (ref 65–99)
POTASSIUM: 3.8 mmol/L (ref 3.5–5.1)
Sodium: 140 mmol/L (ref 135–145)

## 2015-12-13 LAB — GLUCOSE, CAPILLARY
GLUCOSE-CAPILLARY: 201 mg/dL — AB (ref 65–99)
GLUCOSE-CAPILLARY: 194 mg/dL — AB (ref 65–99)
GLUCOSE-CAPILLARY: 225 mg/dL — AB (ref 65–99)
Glucose-Capillary: 108 mg/dL — ABNORMAL HIGH (ref 65–99)

## 2015-12-13 MED ORDER — ACETAZOLAMIDE 250 MG PO TABS
500.0000 mg | ORAL_TABLET | Freq: Two times a day (BID) | ORAL | Status: AC
  Administered 2015-12-13 (×2): 500 mg via ORAL
  Filled 2015-12-13 (×4): qty 2

## 2015-12-13 NOTE — Care Management Important Message (Signed)
Important Message  Patient Details  Name: [name] MRN: [id number] Date of Birth: Jan 05, 1946   Medicare Important Message Given:  Yes    Corney Knighton A, RN 12/13/2015, 2:49 PM

## 2015-12-13 NOTE — Clinical Social Work Note (Signed)
Clinical Social Work Assessment  Patient Details  Name: [name] MRN: [id number] Date of Birth: 1945-08-27  Date of referral:  12/13/15               Reason for consult:  Facility Placement                Permission sought to share information with:  Oceanographer granted to share information::  Yes, Verbal Permission Granted  Name::        Agency::     Relationship::     Contact Information:     Housing/Transportation Living arrangements for the past 2 months:  Single Family Home Source of Information:  Patient, Adult Children Patient Interpreter Needed:  None Criminal Activity/Legal Involvement Pertinent to Current Situation/Hospitalization:  No - Comment as needed Significant Relationships:  Adult Children, Community Support Lives with:  Self Do you feel safe going back to the place where you live?  Yes Need for family participation in patient care:  No (Coment)  Care giving concerns:  STR   Social Worker assessment / plan:  The patient was resting comfortably with her daughter, son-in-law and their minor children at the bedside. The CSW introduced self and role in care, and the family and patient gave verbal permission to conduct a SNF bed search, indicating [facility] as the first choice and [facility] as the second choice.  The client ambulates with a 4WRW with seat, and she needs assistance with dressing, bathing, medications and cooking. The patient indicated that she manages her own finances; however, after the family discussion, the patient's daughter had a private discussion with the CSW. The patient currently has a privately hired aide everyday for assistance, and the daughter is concerned that the aide and her daughter are taking advantage of her mother via financial manipulation. The patient allegedly has bought the aide and her daughter multiple items and has let the aide's daughter begin living in the home without charging rent. In  addition, the patient has paid for a cruise for the aide. The daughter indicated that during the SNF stay she would take the opportunity to seek different assistance for her mother.    Employment status:  Retired Database administrator PT Recommendations:  Skilled Nursing Facility Information / Referral to community resources:     Patient/Family's Response to care:  The family and patient thanked the CSW for care.  Patient/Family's Understanding of and Emotional Response to Diagnosis, Current Treatment, and Prognosis:  The family and patient understand the dc plan and are in agreement.  Emotional Assessment Appearance:  Appears stated age Attitude/Demeanor/Rapport:   (Pleasant) Affect (typically observed):  Accepting, Calm, Pleasant Orientation:  Oriented to Self, Oriented to Place, Oriented to  Time, Oriented to Situation Alcohol / Substance use:  Never Used Psych involvement (Current and /or in the community):  No (Comment)  Discharge Needs  Concerns to be addressed:  Discharge Planning Concerns Readmission within the last 30 days:  No Current discharge risk:  Physical Impairment Barriers to Discharge:  Continued Medical Work up   [employer], LCSW 12/13/2015, 4:54 PM

## 2015-12-13 NOTE — Progress Notes (Signed)
[facility] Cardiology  SUBJECTIVE: I feel better   Vitals:   12/13/15 0420 12/13/15 0901 12/13/15 0910 12/13/15 0913  BP: (!) 117/51 (!) 132/50    Pulse: 87 89 90 91  Resp: 18 16    Temp: 97.8 F (36.6 C)     TempSrc: Oral     SpO2: 96% 95% (!) 86% 99%  Weight: 113.4 kg (250 lb)     Height:         Intake/Output Summary (Last 24 hours) at 12/13/15 1028 Last data filed at 12/13/15 0409  Gross per 24 hour  Intake              240 ml  Output             1250 ml  Net            -1010 ml      PHYSICAL EXAM  General: Well developed, well nourished, in no acute distress HEENT:  Normocephalic and atramatic Neck:  No JVD.  Lungs: Clear bilaterally to auscultation and percussion. Heart: HRRR . Normal S1 and S2 without gallops or murmurs.  Abdomen: Bowel sounds are positive, abdomen soft and non-tender  Msk:  Back normal, normal gait. Normal strength and tone for age. Extremities: No clubbing, cyanosis or edema.   Neuro: Alert and oriented X 3. Psych:  Good affect, responds appropriately   LABS: Basic Metabolic Panel:  Recent Labs  63/78/58 0442 12/13/15 0440  NA 141 140  K 4.2 3.8  CL 91* 91*  CO2 41* 44*  GLUCOSE 43* 111*  BUN 19 13  CREATININE 1.06* 0.84  CALCIUM 8.6* 8.7*   Liver Function Tests:  Recent Labs  12/10/15 1348  AST 46*  ALT 22  ALKPHOS 126  BILITOT 1.2  PROT 6.9  ALBUMIN 2.5*   No results for input(s): LIPASE, AMYLASE in the last 72 hours. CBC:  Recent Labs  12/10/15 1348  WBC 7.6  HGB 9.7*  HCT 29.7*  MCV 80.5  PLT 227   Cardiac Enzymes:  Recent Labs  12/10/15 1348  TROPONINI 0.05*   BNP: Invalid input(s): POCBNP D-Dimer: No results for input(s): DDIMER in the last 72 hours. Hemoglobin A1C: No results for input(s): HGBA1C in the last 72 hours. Fasting Lipid Panel: No results for input(s): CHOL, HDL, LDLCALC, TRIG, CHOLHDL, LDLDIRECT in the last 72 hours. Thyroid Function Tests: No results for input(s): TSH, T4TOTAL,  T3FREE, THYROIDAB in the last 72 hours.  Invalid input(s): FREET3 Anemia Panel: No results for input(s): VITAMINB12, FOLATE, FERRITIN, TIBC, IRON, RETICCTPCT in the last 72 hours.  No results found.   Echo   TELEMETRY: Normal sinus rhythm:  ASSESSMENT AND PLAN:  Active Problems:   CHF (congestive heart failure) (HCC)    1. Acute on chronic diastolic congestive heart failure due to dietary noncompliance, improved after diuresis 2. Essential hypertension  Recommendations  1. Agree with current therapy 2. Continue diuresis 3. Carefully monitor renal status 4. Counseled the patient about low sodium, no added salt diet  Sign off for now, please call if any questions   Phil Michels, MD, PhD, [facility] 12/13/2015 10:28 AM

## 2015-12-13 NOTE — NC FL2 (Signed)
[place] MEDICAID FL2 LEVEL OF CARE SCREENING TOOL     IDENTIFICATION  Patient Name: [name] Birthdate: 04/18/1945 Sex: female Admission Date (Current Location): 12/10/2015  [place] and [place] Number:  Chiropodist and Address:  [facility], [address] [place], [place] [address]      Provider Number: [id number]  Attending Physician Name and Address:  Milagros Loll, MD  Relative Name and Phone Number:       Current Level of Care: Hospital Recommended Level of Care: Skilled Nursing Facility Prior Approval Number:    Date Approved/Denied: 12/13/15 PASRR Number: [id number]  Discharge Plan: SNF    Current Diagnoses: Patient Active Problem List   Diagnosis Date Noted  . Fatty liver 12/10/2015  . Fracture of multiple ribs 11/25/2015  . B12 deficiency anemia 11/03/2015  . SOB (shortness of breath) on exertion 11/03/2015  . Head injury 10/29/2015  . GERD (gastroesophageal reflux disease) 10/17/2015  . Pure hypercholesterolemia 10/17/2015  . Type 2 diabetes mellitus (HCC) 10/05/2015  . Depression 10/05/2015  . Rash and nonspecific skin eruption 10/05/2015  . CHF (congestive heart failure) (HCC) 03/13/2015  . Carotid stenosis 07/24/2014    Orientation RESPIRATION BLADDER Height & Weight     Self, Situation, Time, Place  O2 (1L o2 to be weaned prior to dc) Continent Weight: 250 lb (113.4 kg) Height:  5\' 2"  (157.5 cm)  BEHAVIORAL SYMPTOMS/MOOD NEUROLOGICAL BOWEL NUTRITION STATUS      Continent    AMBULATORY STATUS COMMUNICATION OF NEEDS Skin   Total Care Verbally Bruising                       Personal Care Assistance Level of Assistance  Bathing, Dressing Bathing Assistance: Maximum assistance   Dressing Assistance: Maximum assistance     Functional Limitations Info             SPECIAL CARE FACTORS FREQUENCY  PT (By licensed PT), OT (By licensed OT)     PT Frequency: 3X per day daily at minimum OT  Frequency: 3X per day daily at minimum            Contractures Contractures Info: Present    Additional Factors Info  Allergies   Allergies Info: Codeine, Liraglutide, Wheat Bran           Current Medications (12/13/2015):  This is the current hospital active medication list Current Facility-Administered Medications  Medication Dose Route Frequency Provider Last Rate Last Dose  . 0.9 %  sodium chloride infusion  250 mL Intravenous PRN 12/15/2015, MD      . acetaminophen (TYLENOL) tablet 650 mg  650 mg Oral Q6H PRN Milagros Loll, MD       Or  . acetaminophen (TYLENOL) suppository 650 mg  650 mg Rectal Q6H PRN Srikar Sudini, MD      . acetaZOLAMIDE (DIAMOX) tablet 500 mg  500 mg Oral BID Milagros Loll, MD   500 mg at 12/13/15 1125  . albuterol (PROVENTIL) (2.5 MG/3ML) 0.083% nebulizer solution 2.5 mg  2.5 mg Nebulization Q2H PRN 12/15/15, MD      . aspirin EC tablet 81 mg  81 mg Oral Daily Milagros Loll, MD   81 mg at 12/13/15 0912  . dapsone tablet 25 mg  25 mg Oral Daily 12/15/15, MD   25 mg at 12/13/15 12/15/15  . docusate sodium (COLACE) capsule 100 mg  100 mg Oral BID [id number], MD   100  mg at 12/13/15 0912  . enoxaparin (LOVENOX) injection 40 mg  40 mg Subcutaneous Q12H Milagros Loll, MD   40 mg at 12/13/15 0915  . ferrous sulfate tablet 325 mg  325 mg Oral Q breakfast Milagros Loll, MD   325 mg at 12/13/15 0912  . furosemide (LASIX) injection 60 mg  60 mg Intravenous BID Milagros Loll, MD   60 mg at 12/13/15 0913  . gabapentin (NEURONTIN) capsule 800 mg  800 mg Oral BID Milagros Loll, MD   800 mg at 12/13/15 0912  . insulin aspart (novoLOG) injection 0-15 Units  0-15 Units Subcutaneous TID WC Milagros Loll, MD   5 Units at 12/13/15 1256  . insulin aspart (novoLOG) injection 0-5 Units  0-5 Units Subcutaneous QHS Milagros Loll, MD   3 Units at 12/12/15 2158  . insulin glargine (LANTUS) injection 8 Units  8 Units Subcutaneous BID Milagros Loll, MD   8 Units at  12/13/15 0914  . ondansetron (ZOFRAN) tablet 4 mg  4 mg Oral Q6H PRN Milagros Loll, MD       Or  . ondansetron (ZOFRAN) injection 4 mg  4 mg Intravenous Q6H PRN Srikar Sudini, MD      . oxyCODONE-acetaminophen (PERCOCET/ROXICET) 5-325 MG per tablet 1 tablet  1 tablet Oral Q8H PRN Milagros Loll, MD   1 tablet at 12/12/15 1517  . pantoprazole (PROTONIX) EC tablet 40 mg  40 mg Oral Daily Milagros Loll, MD   40 mg at 12/13/15 0912  . polyethylene glycol (MIRALAX / GLYCOLAX) packet 17 g  17 g Oral Daily PRN Srikar Sudini, MD      . potassium chloride SA (K-DUR,KLOR-CON) CR tablet 40 mEq  40 mEq Oral Daily Milagros Loll, MD   40 mEq at 12/13/15 0926  . pravastatin (PRAVACHOL) tablet 20 mg  20 mg Oral q1800 Milagros Loll, MD   20 mg at 12/12/15 1708  . sodium chloride flush (NS) 0.9 % injection 3 mL  3 mL Intravenous Q12H Srikar Sudini, MD   3 mL at 12/12/15 2200  . sodium chloride flush (NS) 0.9 % injection 3 mL  3 mL Intravenous Q12H Milagros Loll, MD   3 mL at 12/13/15 0916  . sodium chloride flush (NS) 0.9 % injection 3 mL  3 mL Intravenous PRN Srikar Sudini, MD      . traMADol Janean Sark) tablet 50 mg  50 mg Oral Q6H PRN Milagros Loll, MD   50 mg at 12/11/15 1939     Discharge Medications: Please see discharge summary for a list of discharge medications.  Relevant Imaging Results:  Relevant Lab Results:   Additional Information  SS [id number]  Judi Cong, LCSW

## 2015-12-13 NOTE — Progress Notes (Signed)
Wound care.  Area under her abdominal fold, cleansed and dried.  Blisters continue to weep, yellowish opaque drainage.  Interdry placed in both folds with at least 2 inches beyond the fold.  Instructed patient to alert [place] if she notices that the interdry is no longer visible.  It moves when she moves in the bed.  Foam applied to blisters that are not within the fold.

## 2015-12-13 NOTE — Progress Notes (Signed)
[facility] - [place] at [facility]   PATIENT NAME: [name]    MRN#:  [id number]  DATE OF BIRTH:  Jun 06, 1945  SUBJECTIVE:  Hospital Day: 3 days [name] is a 70 y.o. female presenting with Weakness and Leg Pain  Continues to have some shortness of breath although better. Lower extremity swelling is improved. Still weak.  On 1 L oxygen today.  Waiting for transfer to rehabilitation.  REVIEW OF SYSTEMS:  CONSTITUTIONAL: No fever, fatigue or weakness.  EYES: No blurred or double vision.  EARS, NOSE, AND THROAT: No tinnitus or ear pain.  RESPIRATORY: No cough,Positive shortness of breath, denies wheezing or hemoptysis.  CARDIOVASCULAR: No chest pain, orthopnea, positive edema.  GASTROINTESTINAL: No nausea, vomiting, diarrhea or abdominal pain.  GENITOURINARY: No dysuria, hematuria.  ENDOCRINE: No polyuria, nocturia,  HEMATOLOGY: No anemia, easy bruising or bleeding SKIN: No rash or lesion. MUSCULOSKELETAL: No joint pain or arthritis.   NEUROLOGIC: No tingling, numbness, weakness.  PSYCHIATRY: No anxiety or depression.   DRUG ALLERGIES:   Allergies  Allergen Reactions  . Codeine Other (See Comments)    Reaction:  Dizziness   . Liraglutide     Other reaction(s): nausea and stomach pain  . Wheat Bran Other (See Comments)    Reaction:  Sneezing     VITALS:  Blood pressure (!) 132/50, pulse 91, temperature 97.8 F (36.6 C), temperature source Oral, resp. rate 16, height 5\' 2"  (1.575 m), weight 113.4 kg (250 lb), SpO2 99 %.  PHYSICAL EXAMINATION:  VITAL SIGNS: Vitals:   12/13/15 0910 12/13/15 0913  BP:    Pulse: 90 91  Resp:    Temp:     GENERAL:69 y.o.female currently in no acute distress. Morbidly obese HEAD: Normocephalic, atraumatic.  EYES: Pupils equal, round, reactive to light. Extraocular muscles intact. No scleral icterus.  MOUTH: Moist mucosal membrane. Dentition intact. No abscess noted.  EAR, NOSE, THROAT: Clear  without exudates. No external lesions.  NECK: Supple. No thyromegaly. No nodules. No JVD.  PULMONARY: Diminished breath sounds throughout without wheeze or rhonci. No use of accessory muscles, Good respiratory effort. Bilateral crackles. CHEST: Nontender to palpation.  CARDIOVASCULAR: S1 and S2. Regular rate and rhythm. No murmurs, rubs, or gallops. 2+ edema. Pedal pulses 2+ bilaterally.  GASTROINTESTINAL: Soft, nontender, nondistended. No masses. Positive bowel sounds. No hepatosplenomegaly.  MUSCULOSKELETAL: No swelling, clubbing, or edema. Range of motion full in all extremities.  NEUROLOGIC: Cranial nerves II through XII are intact. No gross focal neurological deficits. Sensation intact. Reflexes intact.  SKIN: No ulceration, lesions, rashes, or cyanosis. Skin warm and dry. Turgor intact.  PSYCHIATRIC: Mood, affect within normal limits. The patient is awake, alert and oriented x 3. Insight, judgment intact.   LABORATORY PANEL:   CBC  Recent Labs Lab 12/10/15 1348  WBC 7.6  HGB 9.7*  HCT 29.7*  PLT 227   ------------------------------------------------------------------------------------------------------------------  Chemistries   Recent Labs Lab 12/10/15 1348  12/13/15 0440  NA 136  < > 140  K 3.1*  < > 3.8  CL 87*  < > 91*  CO2 38*  < > 44*  GLUCOSE 213*  < > 111*  BUN 21*  < > 13  CREATININE 1.26*  < > 0.84  CALCIUM 8.6*  < > 8.7*  AST 46*  --   --   ALT 22  --   --   ALKPHOS 126  --   --   BILITOT 1.2  --   --   < > =  values in this interval not displayed. ------------------------------------------------------------------------------------------------------------------  Cardiac Enzymes  Recent Labs Lab 12/10/15 1348  TROPONINI 0.05*   ------------------------------------------------------------------------------------------------------------------  RADIOLOGY:  No results found.  EKG:   Orders placed or performed during the hospital encounter of  12/10/15  . ED EKG  . ED EKG    ASSESSMENT AND PLAN:   [name] is a 70 y.o. female presenting with Weakness and Leg Pain . Admitted 12/10/2015 : Day #: 3 days   1. Acute on chronic diastolic congestive heart failure with acute hypoxic respiratory failure: Due to indiscretion with salt intake and fluids. Continue IV Lasix. Monitor input and output with daily weight. Counseled again regarding salt and fluid intake. Wean oxygen as tolerated.  2. Recurrent falls Progressively worsening and chronic problem.  3. Type 2 diabetes insulin  Hypoglycemic episodes. Oral hypoglycemics discontinued. Continue insulin. Sliding scale insulin.  4. Essential hypertension continue medications  She will need skilled nursing facility at discharge.  All the records are reviewed and case discussed with Care Management/Social Workerr. Management plans discussed with the patient, family and they are in agreement.  CODE STATUS: full TOTAL TIME TAKING CARE OF THIS PATIENT: 30 minutes.   POSSIBLE D/C IN 1-2DAYS, DEPENDING ON CLINICAL CONDITION.   [name] R M.D on 12/13/2015 at 10:12 AM  Between 7am to 6pm - Pager - [phone]  After 6pm: House Pager: [phone]  [facility]  Office  [phone]  CC: Primary care physician; Rennie Plowman, FNP

## 2015-12-14 LAB — BASIC METABOLIC PANEL
Anion gap: 6 (ref 5–15)
BUN: 11 mg/dL (ref 6–20)
CHLORIDE: 92 mmol/L — AB (ref 101–111)
CO2: 39 mmol/L — AB (ref 22–32)
CREATININE: 0.85 mg/dL (ref 0.44–1.00)
Calcium: 8.5 mg/dL — ABNORMAL LOW (ref 8.9–10.3)
GFR calc Af Amer: >60 mL/min (ref >60)
GFR calc non Af Amer: >60 mL/min (ref >60)
GLUCOSE: 165 mg/dL — AB (ref 65–99)
POTASSIUM: 2.5 mmol/L — AB (ref 3.5–5.1)
SODIUM: 137 mmol/L (ref 135–145)

## 2015-12-14 LAB — CBC
HEMATOCRIT: 30.2 % — AB (ref 35.0–47.0)
HEMOGLOBIN: 9.8 g/dL — AB (ref 12.0–16.0)
MCH: 26.8 pg (ref 26.0–34.0)
MCHC: 32.5 g/dL (ref 32.0–36.0)
MCV: 82.4 fL (ref 80.0–100.0)
Platelets: 213 10*3/uL (ref 150–440)
RBC: 3.66 MIL/uL — AB (ref 3.80–5.20)
RDW: 22.1 % — ABNORMAL HIGH (ref 11.5–14.5)
WBC: 6.6 10*3/uL (ref 3.6–11.0)

## 2015-12-14 LAB — GLUCOSE, CAPILLARY
GLUCOSE-CAPILLARY: 137 mg/dL — AB (ref 65–99)
GLUCOSE-CAPILLARY: 187 mg/dL — AB (ref 65–99)
GLUCOSE-CAPILLARY: 144 mg/dL — AB (ref 65–99)

## 2015-12-14 LAB — MAGNESIUM: Magnesium: 1.7 mg/dL (ref 1.7–2.4)

## 2015-12-14 LAB — POTASSIUM: Potassium: 3.4 mmol/L — ABNORMAL LOW (ref 3.5–5.1)

## 2015-12-14 MED ORDER — DOCUSATE SODIUM 100 MG PO CAPS: 100.0000 mg | ORAL_CAPSULE | Freq: Two times a day (BID) | ORAL | 0 refills | Status: DC

## 2015-12-14 MED ORDER — MAGNESIUM OXIDE 400 (241.3 MG) MG PO TABS: 400.0000 mg | ORAL_TABLET | Freq: Every day | ORAL | 0 refills | Status: DC

## 2015-12-14 MED ORDER — FUROSEMIDE 20 MG PO TABS: 60.0000 mg | ORAL_TABLET | Freq: Two times a day (BID) | ORAL | Status: DC

## 2015-12-14 MED ORDER — INSULIN ASPART 100 UNIT/ML ~~LOC~~ SOLN: 0.0000 [IU] | Freq: Three times a day (TID) | SUBCUTANEOUS | 11 refills | Status: DC

## 2015-12-14 MED ORDER — ALBUTEROL SULFATE (2.5 MG/3ML) 0.083% IN NEBU: 2.5000 mg | INHALATION_SOLUTION | RESPIRATORY_TRACT | 12 refills | Status: DC | PRN

## 2015-12-14 MED ORDER — POTASSIUM CHLORIDE 10 MEQ/100ML IV SOLN
10.0000 meq | INTRAVENOUS | Status: DC
  Administered 2015-12-14: 10 meq via INTRAVENOUS
  Filled 2015-12-14 (×4): qty 100

## 2015-12-14 MED ORDER — POTASSIUM CHLORIDE 10 MEQ/100ML IV SOLN
10.0000 meq | INTRAVENOUS | Status: DC
  Administered 2015-12-14: 10 meq via INTRAVENOUS
  Filled 2015-12-14: qty 100

## 2015-12-14 MED ORDER — POTASSIUM CHLORIDE CRYS ER 20 MEQ PO TBCR
40.0000 meq | EXTENDED_RELEASE_TABLET | Freq: Two times a day (BID) | ORAL | Status: DC
  Administered 2015-12-14: 40 meq via ORAL
  Filled 2015-12-14: qty 2

## 2015-12-14 MED ORDER — OXYCODONE-ACETAMINOPHEN 5-325 MG PO TABS: 1.0000 | ORAL_TABLET | Freq: Three times a day (TID) | ORAL | 0 refills | Status: DC | PRN

## 2015-12-14 MED ORDER — ACETAMINOPHEN 325 MG PO TABS: 650.0000 mg | ORAL_TABLET | Freq: Four times a day (QID) | ORAL | Status: AC | PRN

## 2015-12-14 NOTE — Discharge Summary (Signed)
[facility] Physicians -  at [facility]   PATIENT NAME: [name]    MR#:  [id number]  DATE OF BIRTH:  October 31, 1945  DATE OF ADMISSION:  12/10/2015 ADMITTING PHYSICIAN: Milagros Loll, MD  DATE OF DISCHARGE: 12/14/15 PRIMARY CARE PHYSICIAN: Rennie Plowman, FNP    ADMISSION DIAGNOSIS:  Acute on chronic combined systolic and diastolic congestive heart failure (HCC) [I50.43] Acute respiratory failure with hypoxia (HCC) [J96.01]  DISCHARGE DIAGNOSIS:  Active Problems:   CHF (congestive heart failure) (HCC)   SECONDARY DIAGNOSIS:   Past Medical History:  Diagnosis Date  . Anemia   . Asthma   . CVA (cerebral vascular accident) (HCC)   . Diabetes mellitus without complication (HCC)   . GERD (gastroesophageal reflux disease)   . Heart murmur   . History of hiatal hernia   . Hypertension   . Panic attacks   . Peripheral vascular disease (HCC)   . Restless leg syndrome   . Shortness of breath dyspnea     HOSPITAL COURSE:   [name] is a 70 y.o. female presenting with Weakness and Leg Pain. Please review history and physical for details . Admitted 12/10/2015 :  1. Acute on chronic diastolic congestive heart failure with acute hypoxic respiratory failure: Due to indiscretion with salt intake and fluids. Provided IV Lasix, clinically improved and changed to by mouth Lasix 40 mg by mouth twice a day. Closely monitor electrolytes and replete as needed. Repeat in the next 24-48 hours Monitor input and output with daily weight. Counseled again regarding salt and fluid intake.  Currently on 1 L of oxygen will continue the same and wean off as tolerated Outpatient follow-up with cardiology Dr. Beola Cord shows Outpatient follow-up with CHF clinic on October 26 at 9 AM Outpatient sleep study is recommended for possible sleep apnea   #hypokalemia Potassium at 2.5 repleted and repeat potassium at 3.4 and magnesium 1.7 Continue by mouth potassium and  magnesium supplements and monitor labs closely    #. Recurrent falls Progressively worsening and chronic problem. PT evaluation and therapy at [facility]  #. Type 2 diabetes insulin  Hypoglycemic episodes. Oral hypoglycemics discontinued. Continue insulin Sliding scale insulin prn   4. Essential hypertension continue medications   Discharge patient to [facility] comments today for continuing PT assessment,  rehabilitation  DISCHARGE CONDITIONS:   fair  CONSULTS OBTAINED:  Treatment Team:  Marcina Millard, MD   PROCEDURES none  DRUG ALLERGIES:   Allergies  Allergen Reactions  . Codeine Other (See Comments)    Reaction:  Dizziness   . Liraglutide     Other reaction(s): nausea and stomach pain  . Wheat Bran Other (See Comments)    Reaction:  Sneezing     DISCHARGE MEDICATIONS:   Current Discharge Medication List    START taking these medications   Details  acetaminophen (TYLENOL) 325 MG tablet Take 2 tablets (650 mg total) by mouth every 6 (six) hours as needed for mild pain (or Fever >/= 101).    albuterol (PROVENTIL) (2.5 MG/3ML) 0.083% nebulizer solution Take 3 mLs (2.5 mg total) by nebulization every 4 (four) hours as needed for wheezing. Qty: 75 mL, Refills: 12    docusate sodium (COLACE) 100 MG capsule Take 1 capsule (100 mg total) by mouth 2 (two) times daily. Qty: 10 capsule, Refills: 0    magnesium oxide (MAG-OX) 400 (241.3 Mg) MG tablet Take 1 tablet (400 mg total) by mouth daily. Qty: 7 tablet, Refills: 0  CONTINUE these medications which have CHANGED   Details  insulin aspart (NOVOLOG) 100 UNIT/ML injection Inject 0-15 Units into the skin 3 (three) times daily with meals. CBG < 70: implement hypoglycemia protocol CBG 70 - 120: 0 units CBG 121 - 150: 0 units CBG 151 - 200: 0 units CBG 201 - 250: 2 units CBG 251 - 300: 3 units CBG 301 - 350: 4 units CBG 351 - 400: 5 units CBG > 400: call MD and obtain STAT lab verification Qty:  10 mL, Refills: 11    oxyCODONE-acetaminophen (PERCOCET) 5-325 MG tablet Take 1 tablet by mouth every 8 (eight) hours as needed for moderate pain or severe pain. Qty: 30 tablet, Refills: 0      CONTINUE these medications which have NOT CHANGED   Details  clobetasol ointment (TEMOVATE) 0.05 % Apply to rash twice daily    cyanocobalamin (,VITAMIN B-12,) 1000 MCG/ML injection 1000 mcg (1 mg) injection once per week for four weeks, followed by 1000 mcg injection once per month. Qty: 1 mL, Refills: 9   Associated Diagnoses: B12 deficiency anemia    dapsone 25 MG tablet Take 25 mg by mouth daily.    diclofenac (VOLTAREN) 50 MG EC tablet Take 50 mg by mouth 2 (two) times daily as needed for moderate pain.    ferrous sulfate 325 (65 FE) MG tablet Take 1 tablet (325 mg total) by mouth daily with breakfast. Qty: 30 tablet, Refills: 3   Associated Diagnoses: Anemia, unspecified anemia type    fluconazole (DIFLUCAN) 150 MG tablet Take 150 mg by mouth every morning. For 3 weeks    furosemide (LASIX) 40 MG tablet Take 1 tablet (40 mg total) by mouth 2 (two) times daily. Qty: 60 tablet, Refills: 0    gabapentin (NEURONTIN) 800 MG tablet Take 800 mg by mouth 2 (two) times daily.    hydrochlorothiazide (HYDRODIURIL) 12.5 MG tablet Take 12.5 mg by mouth 2 (two) times daily.     loratadine (CLARITIN) 10 MG tablet Take 10 mg by mouth daily as needed for allergies.     lovastatin (MEVACOR) 20 MG tablet Take 20 mg by mouth at bedtime.    naproxen (NAPROSYN) 500 MG tablet Take 500 mg by mouth at bedtime.     omeprazole (PRILOSEC) 40 MG capsule Take 40 mg by mouth at bedtime.     potassium chloride SA (K-DUR,KLOR-CON) 20 MEQ tablet Take 2 tablets (40 mEq total) by mouth daily. Qty: 30 tablet, Refills: 2   Associated Diagnoses: Hypokalemia    triamcinolone (KENALOG) 0.025 % cream Apply 1 application topically every Monday, Wednesday, and Friday.    aspirin EC 81 MG tablet Take 81 mg by mouth  daily.    mupirocin cream (BACTROBAN) 2 % Apply 1 application topically 3 (three) times daily. Qty: 15 g, Refills: 1   Associated Diagnoses: Rash and nonspecific skin eruption    mupirocin ointment (BACTROBAN) 2 % Place 1 application into the nose 2 (two) times daily. Qty: 22 g, Refills: 0      STOP taking these medications     glimepiride (AMARYL) 2 MG tablet      insulin glargine (LANTUS) 100 UNIT/ML injection      metFORMIN (GLUCOPHAGE-XR) 500 MG 24 hr tablet      cephALEXin (KEFLEX) 500 MG capsule          DISCHARGE INSTRUCTIONS:   Activity as tolerated per PT recommendations  follow-up with primary care physician at the facility in 2 days PCP at  [facility] to repeat BMP and magnesium in 1-2 days Follow-up with CHF clinic on October 26 at 9 AM   DIET:  Cardiac diet, diabetic  DISCHARGE CONDITION:  Fair  ACTIVITY:  Activity as tolerated  OXYGEN:  Home Oxygen: Yes.     Oxygen Delivery: 1 liters/min via Patient connected to nasal cannula oxygen  DISCHARGE LOCATION:  nursing home   If you experience worsening of your admission symptoms, develop shortness of breath, life threatening emergency, suicidal or homicidal thoughts you must seek medical attention immediately by calling 911 or calling your MD immediately  if symptoms less severe.  You Must read complete instructions/literature along with all the possible adverse reactions/side effects for all the Medicines you take and that have been prescribed to you. Take any new Medicines after you have completely understood and accpet all the possible adverse reactions/side effects.   Please note  You were cared for by a hospitalist during your hospital stay. If you have any questions about your discharge medications or the care you received while you were in the hospital after you are discharged, you can call the unit and asked to speak with the hospitalist on call if the hospitalist that took care of you is not  available. Once you are discharged, your primary care physician will handle any further medical issues. Please note that NO REFILLS for any discharge medications will be authorized once you are discharged, as it is imperative that you return to your primary care physician (or establish a relationship with a primary care physician if you do not have one) for your aftercare needs so that they can reassess your need for medications and monitor your lab values.     Today  Chief Complaint  Patient presents with  . Weakness  . Leg Pain   Patient is better today but not at her baseline. Still weak. Sleepy but arousable and answering questions and according to the daughter patient has disturbed sleep during nights, concerned about sleep apnea  ROS:  CONSTITUTIONAL: Denies fevers, chills. Denies any fatigue, weakness.  EYES: Denies blurry vision, double vision, eye pain. EARS, NOSE, THROAT: Denies tinnitus, ear pain, hearing loss. RESPIRATORY: Denies cough, wheeze, shortness of breath while resting  CARDIOVASCULAR: Denies chest pain, palpitations, edema.  GASTROINTESTINAL: Denies nausea, vomiting, diarrhea, abdominal pain. Denies bright red blood per rectum. GENITOURINARY: Denies dysuria, hematuria. ENDOCRINE: Denies nocturia or thyroid problems. HEMATOLOGIC AND LYMPHATIC: Denies easy bruising or bleeding. SKIN: Denies rash or lesion. MUSCULOSKELETAL: Denies pain in neck, back, shoulder, knees, hips or arthritic symptoms.  NEUROLOGIC: Denies paralysis, paresthesias.  PSYCHIATRIC: Denies anxiety or depressive symptoms.   VITAL SIGNS:  Blood pressure (!) 137/59, pulse 86, temperature 97.8 F (36.6 C), temperature source Oral, resp. rate 16, height 5\' 2"  (1.575 m), weight 114.6 kg (252 lb 11.2 oz), SpO2 97 %.  I/O:    Intake/Output Summary (Last 24 hours) at 12/14/15 1544 Last data filed at 12/14/15 1405  Gross per 24 hour  Intake              240 ml  Output              700 ml  Net              -460 ml    PHYSICAL EXAMINATION:  GENERAL:  70 y.o.-year-old patient lying in the bed with no acute distress.  EYES: Pupils equal, round, reactive to light and accommodation. No scleral icterus. Extraocular muscles intact.  HEENT: Head atraumatic,  normocephalic. Oropharynx and nasopharynx clear.  NECK:  Supple, no jugular venous distention. No thyroid enlargement, no tenderness.  LUNGS: Normal breath sounds bilaterally, no wheezing, rales,rhonchi or crepitation. No use of accessory muscles of respiration.  CARDIOVASCULAR: S1, S2 normal. No murmurs, rubs, or gallops.  ABDOMEN: Soft, non-tender, non-distended. Bowel sounds present. No organomegaly or mass.  EXTREMITIES: No pedal edema, cyanosis, or clubbing.  NEUROLOGIC: Cranial nerves II through XII areGrossly intact. Muscle strength seemed to be at baseline. Sensations intact and gait not checked  PSYCHIATRIC: The patient is alert and oriented x 3.  SKIN: No obvious rash, lesion, or ulcer.   DATA REVIEW:   CBC  Recent Labs Lab 12/14/15 0543  WBC 6.6  HGB 9.8*  HCT 30.2*  PLT 213    Chemistries   Recent Labs Lab 12/10/15 1348  12/14/15 0543 12/14/15 1402  NA 136  < > 137  --   K 3.1*  < > 2.5* 3.4*  CL 87*  < > 92*  --   CO2 38*  < > 39*  --   GLUCOSE 213*  < > 165*  --   BUN 21*  < > 11  --   CREATININE 1.26*  < > 0.85  --   CALCIUM 8.6*  < > 8.5*  --   MG  --   --   --  1.7  AST 46*  --   --   --   ALT 22  --   --   --   ALKPHOS 126  --   --   --   BILITOT 1.2  --   --   --   < > = values in this interval not displayed.  Cardiac Enzymes  Recent Labs Lab 12/10/15 1348  TROPONINI 0.05*    Microbiology Results  Results for orders placed or performed during the hospital encounter of 09/10/15  Aerobic Culture (superficial specimen)     Status: None   Collection Time: 09/10/15  9:33 AM  Result Value Ref Range Status   Specimen Description WOUND  Final   Special Requests NONE  Final   Gram Stain    Final    RARE WBC PRESENT,BOTH PMN AND MONONUCLEAR NO ORGANISMS SEEN    Culture   Final    NO GROWTH 2 DAYS Performed at [facility]    Report Status 09/13/2015 FINAL  Final    RADIOLOGY:  Dg Chest 2 View  Result Date: 12/10/2015 CLINICAL DATA:  Weakness for 1 week. Status post fall 4 days ago. Dizziness. EXAM: CHEST  2 VIEW COMPARISON:  PA and lateral chest 11/30/2015. Single-view of the chest 12/19/2013 and 08/22/2013. FINDINGS: There is marked cardiomegaly and mild interstitial edema. No pneumothorax. No effusion is identified. Aortic atherosclerosis is noted. No focal bony abnormality. IMPRESSION: Cardiomegaly and mild interstitial edema. Atherosclerosis. Electronically Signed   By: Drusilla Kanner M.D.   On: 12/10/2015 16:17   Ct Head Wo Contrast  Result Date: 12/10/2015 CLINICAL DATA:  Fall 3 days ago with head injury and bruising. Dizziness. Worsening lower extremity weakness for 1 week. Initial encounter. EXAM: CT HEAD WITHOUT CONTRAST TECHNIQUE: Contiguous axial images were obtained from the base of the skull through the vertex without intravenous contrast. COMPARISON:  10/29/2015 FINDINGS: Brain: No evidence of acute infarction, hemorrhage, hydrocephalus, extra-axial collection or mass lesion/mass effect. Mild diffuse cerebral atrophy chronic small vessel disease are stable in appearance. Vascular: No hyperdense vessel or unexpected calcification. Skull: Normal. Negative for fracture or  focal lesion. Sinuses/Orbits: No acute finding. Other: None. IMPRESSION: No acute intracranial abnormality. Stable mild cerebral atrophy and chronic small vessel disease. Electronically Signed   By: Myles Rosenthal M.D.   On: 12/10/2015 15:52   Dg Hip Unilat W Or Wo Pelvis 2-3 Views Left  Result Date: 12/10/2015 CLINICAL DATA:  Left hip pain, possible injury EXAM: DG HIP (WITH OR WITHOUT PELVIS) 2-3V LEFT COMPARISON:  None. FINDINGS: Three views of the left hip submitted. No acute fracture or  subluxation. Mild degenerative changes bilateral hip joints with superior acetabular spurring. Mild degenerative changes pubic symphysis. IMPRESSION: No acute fracture or subluxation.  Mild degenerative changes. Electronically Signed   By: Natasha Mead M.D.   On: 12/10/2015 16:17    EKG:   Orders placed or performed during the hospital encounter of 12/10/15  . ED EKG  . ED EKG      Management plans discussed with the patient, family and they are in agreement.  CODE STATUS:     Code Status Orders        Start     Ordered   12/10/15 1655  Full code  Continuous     12/10/15 1655    Code Status History    Date Active Date Inactive Code Status Order ID Comments User Context   03/13/2015  7:03 PM 03/15/2015  6:55 PM Full Code [id number]  Altamese Dilling, MD Inpatient   07/24/2014  5:53 PM 07/25/2014  6:55 PM Full Code [id number]  Annice Needy, MD Inpatient    Advance Directive Documentation   Flowsheet Row Most Recent Value  Type of Advance Directive  Healthcare Power of Attorney  Pre-existing out of facility DNR order (yellow form or pink MOST form)  No data  "MOST" Form in Place?  No data      TOTAL TIME TAKING CARE OF THIS PATIENT: 45  minutes.   Note: This dictation was prepared with Dragon dictation along with smaller phrase technology. Any transcriptional errors that result from this process are unintentional.   @MEC @  on 12/14/2015 at 3:44 PM  Between 7am to 6pm - Pager - [phone]  After 6pm go to [email or web address] - password [email or web address] [facility]  [place]  Office  [phone]  CC: Primary care physician; [phone], FNP

## 2015-12-14 NOTE — Progress Notes (Signed)
Initial appointment scheduled for December 24, 2015 at 9:00am. Thank you

## 2015-12-14 NOTE — Clinical Social Work Placement (Signed)
   CLINICAL SOCIAL WORK PLACEMENT  NOTE  Date:  12/14/2015  Patient Details  Name: [name] MRN: [id number] Date of Birth: 12-29-1945  Clinical Social Work is seeking post-discharge placement for this patient at the Skilled  Nursing Facility level of care (*CSW will initial, date and re-position this form in  chart as items are completed):  Yes   Patient/family provided with [facility] Clinical Social Work Department's list of facilities offering this level of care within the geographic area requested by the patient (or if unable, by the patient's family).  Yes   Patient/family informed of their freedom to choose among providers that offer the needed level of care, that participate in Medicare, Medicaid or managed care program needed by the patient, have an available bed and are willing to accept the patient.  Yes   Patient/family informed of [facility]'s ownership interest in [facility] and [facility], as well as of the fact that they are under no obligation to receive care at these facilities.  PASRR submitted to EDS on 12/14/15     PASRR number received on       Existing PASRR number confirmed on 12/14/15     FL2 transmitted to all facilities in geographic area requested by pt/family on 12/13/15     FL2 transmitted to all facilities within larger geographic area on       Patient informed that his/her managed care company has contracts with or will negotiate with certain facilities, including the following:        Yes   Patient/family informed of bed offers received.  Patient chooses bed at [facility]     Physician recommends and patient chooses bed at      Patient to be transferred to [facility] on 12/14/15.  Patient to be transferred to facility by [facility]     Patient family notified on 12/14/15 of transfer.  Name of family member notified:  Daughter was at bedside.     PHYSICIAN Please sign FL2      Additional Comment:    _______________________________________________ Darleene Cleaver 12/14/2015, 4:27 PM

## 2015-12-14 NOTE — Progress Notes (Signed)
  Patient being discharged to [facility].  Report called.  2 packages of Interdry dressing being sent with her.  IV and Telemetry DC'd.  Skin care needs reported to [facility] nurse in detail, particularly how the interdry is applied, and the need for 2 inches to be visible to wicking the moisture.  Discharge instructions given to the family and a copy sent to [facility].

## 2015-12-14 NOTE — Consult Note (Signed)
WOC Nurse wound follow up Wound type: ID to abdominal pannus.  Interdry AG is keeping skin dry and lesions are beefy red today.   Measurement:See previous note Wound PHX:TAVWP red Drainage (amount, consistency, odor) Moderate serous weeping in skin fold.  Periwound:Intact Dressing procedure/placement/frequency:Continue Interdry Ag.  Will send home with additional sheets today.  Will not follow at this time.  Please re-consult if needed.  Maple Hudson RN BSN CWON Pager [phone]

## 2015-12-14 NOTE — Clinical Social Work Note (Addendum)
Patient has a bed available at [facility], patient has [employer], MSW has faxed clinicals awaiting insurance approval.  2:00pm MSW received insurance approval patient's authorization number is [id number].  MSW contacted [facility], who can accept patient.  Patient to be d/c'ed today to [facility].  Patient and family agreeable to plans will transport via ems RN to call report to [phone].  Ervin Knack. Christa Fasig, MSW [phone]  Mon-Fri 8a-4:30p 12/14/2015 1:34 PM

## 2015-12-14 NOTE — Discharge Instructions (Signed)
Heart Failure Clinic appointment on December 24, 2015 at 9:00am with Clarisa Kindred, FNP. Please call [phone] to reschedule. Activity as tolerated per PT recommendations  follow-up with primary care physician at the facility in 2 days PCP at [facility] to repeat BMP and magnesium in 1-2 days Follow-up with CHF clinic on October 26 at 9 AM

## 2015-12-14 NOTE — Progress Notes (Signed)
PT Cancellation Note  Patient Details Name: [name] MRN: [id number] DOB: [birth year]   Cancelled Treatment:    Reason Eval/Treat Not Completed: Medical issues which prohibited therapy Pt. Potassium critical value this am (2.5). Will re-attempt when medically appropriate.    [place] Dec 12/14/2015, 10:52 AM

## 2015-12-14 NOTE — Progress Notes (Signed)
Lab called, notified of K+ 2.5. Paged MD, awaiting call back. Dr. Emmit Pomfret called back, aware and awaiting orders.

## 2015-12-14 NOTE — Progress Notes (Signed)
K = 2.5.  Starting 4 IVP Potassium 10, meq. IV is possitional, but patent, has excellent blood return.

## 2015-12-14 NOTE — Progress Notes (Signed)
O2 sts on 1 L = 99%,  On R/A = 97%. At 0815.  O2 sats on R/A at 0910 = 88 %.  1L [place] restarted.

## 2015-12-15 NOTE — Telephone Encounter (Signed)
FYI

## 2015-12-15 NOTE — Telephone Encounter (Signed)
noted 

## 2015-12-15 NOTE — Telephone Encounter (Signed)
Left message for patients caregiver to return call back.

## 2015-12-15 NOTE — Telephone Encounter (Signed)
Please call patient and caregiver to see how they are-  I know they are headed to [facility] for rehab.  I have been reading notes from hospitalization and have placed an order for OSA sleep study as recommended during her stay.  This can be done after rehab.  Hope she is feeling better and look forward to seeing her at f/u.

## 2015-12-24 ENCOUNTER — Ambulatory Visit: Payer: Medicare Other | Admitting: Family

## 2015-12-29 ENCOUNTER — Telehealth: Payer: Self-pay | Admitting: Family

## 2015-12-29 NOTE — Telephone Encounter (Signed)
[name] from [facility] called and stated that you are starting Home Care Nursing and Physical Therapy today.  Call pt @ [phone]

## 2015-12-29 NOTE — Telephone Encounter (Signed)
FYI patient started today.

## 2015-12-30 ENCOUNTER — Encounter: Payer: Self-pay | Admitting: Family

## 2015-12-30 ENCOUNTER — Ambulatory Visit (INDEPENDENT_AMBULATORY_CARE_PROVIDER_SITE_OTHER): Payer: Medicare Other | Admitting: Family

## 2015-12-30 VITALS — BP 134/70 | HR 90 | Temp 97.4°F | Ht 62.0 in | Wt 240.6 lb

## 2015-12-30 DIAGNOSIS — Z794 Long term (current) use of insulin: Secondary | ICD-10-CM

## 2015-12-30 DIAGNOSIS — Z0000 Encounter for general adult medical examination without abnormal findings: Secondary | ICD-10-CM

## 2015-12-30 DIAGNOSIS — I509 Heart failure, unspecified: Secondary | ICD-10-CM

## 2015-12-30 DIAGNOSIS — E118 Type 2 diabetes mellitus with unspecified complications: Secondary | ICD-10-CM | POA: Diagnosis not present

## 2015-12-30 DIAGNOSIS — R296 Repeated falls: Secondary | ICD-10-CM | POA: Diagnosis not present

## 2015-12-30 DIAGNOSIS — Z7189 Other specified counseling: Secondary | ICD-10-CM | POA: Insufficient documentation

## 2015-12-30 LAB — CBC WITH DIFFERENTIAL/PLATELET
BASOS PCT: 0.4 % (ref 0.0–3.0)
Basophils Absolute: 0.1 10*3/uL (ref 0.0–0.1)
EOS PCT: 2.9 % (ref 0.0–5.0)
Eosinophils Absolute: 0.4 10*3/uL (ref 0.0–0.7)
HCT: 36.8 % (ref 36.0–46.0)
Hemoglobin: 11.9 g/dL — ABNORMAL LOW (ref 12.0–15.0)
LYMPHS ABS: 2 10*3/uL (ref 0.7–4.0)
Lymphocytes Relative: 14.8 % (ref 12.0–46.0)
MCHC: 32.2 g/dL (ref 30.0–36.0)
MCV: 82.1 fl (ref 78.0–100.0)
MONOS PCT: 6.3 % (ref 3.0–12.0)
Monocytes Absolute: 0.9 10*3/uL (ref 0.1–1.0)
NEUTROS PCT: 75.6 % (ref 43.0–77.0)
Neutro Abs: 10.5 10*3/uL — ABNORMAL HIGH (ref 1.4–7.7)
Platelets: 295 10*3/uL (ref 150.0–400.0)
RBC: 4.48 Mil/uL (ref 3.87–5.11)
RDW: 22.1 % — ABNORMAL HIGH (ref 11.5–15.5)
WBC: 13.9 10*3/uL — ABNORMAL HIGH (ref 4.0–10.5)

## 2015-12-30 LAB — COMPREHENSIVE METABOLIC PANEL
ALBUMIN: 3.2 g/dL — AB (ref 3.5–5.2)
ALK PHOS: 168 U/L — AB (ref 39–117)
ALT: 27 U/L (ref 0–35)
AST: 63 U/L — ABNORMAL HIGH (ref 0–37)
BUN: 19 mg/dL (ref 6–23)
CO2: 32 mEq/L (ref 19–32)
Calcium: 9.3 mg/dL (ref 8.4–10.5)
Chloride: 92 mEq/L — ABNORMAL LOW (ref 96–112)
Creatinine, Ser: 1.27 mg/dL — ABNORMAL HIGH (ref 0.40–1.20)
GFR: 44.23 mL/min — AB (ref >60.00)
Glucose, Bld: 331 mg/dL — ABNORMAL HIGH (ref 70–99)
POTASSIUM: 4.5 meq/L (ref 3.5–5.1)
Sodium: 133 mEq/L — ABNORMAL LOW (ref 135–145)
TOTAL PROTEIN: 8.1 g/dL (ref 6.0–8.3)
Total Bilirubin: 0.5 mg/dL (ref 0.2–1.2)

## 2015-12-30 LAB — VITAMIN D 25 HYDROXY (VIT D DEFICIENCY, FRACTURES): VITD: 31.99 ng/mL (ref 30.00–100.00)

## 2015-12-30 LAB — TSH: TSH: 1.33 u[IU]/mL (ref 0.35–4.50)

## 2015-12-30 LAB — LIPID PANEL
Cholesterol: 146 mg/dL (ref 0–200)
HDL: 49.7 mg/dL (ref >39.00)
LDL Cholesterol: 73 mg/dL (ref 0–99)
NonHDL: 96.24
TRIGLYCERIDES: 116 mg/dL (ref 0.0–149.0)
Total CHOL/HDL Ratio: 3
VLDL: 23.2 mg/dL (ref 0.0–40.0)

## 2015-12-30 LAB — HEMOGLOBIN A1C: HEMOGLOBIN A1C: 8.1 % — AB (ref 4.6–6.5)

## 2015-12-30 MED ORDER — TETANUS-DIPHTH-ACELL PERTUSSIS 5-2.5-18.5 LF-MCG/0.5 IM SUSP: 0.5000 mL | Freq: Once | INTRAMUSCULAR | 0 refills | Status: AC

## 2015-12-30 MED ORDER — PNEUMOCOCCAL 13-VAL CONJ VACC IM SUSP
0.5000 mL | INTRAMUSCULAR | Status: DC
Start: 2015-12-31 — End: 2015-12-30

## 2015-12-30 NOTE — Telephone Encounter (Signed)
noted 

## 2015-12-30 NOTE — Patient Instructions (Addendum)
Pleasure seeing you.  Monitor daily weights, low salt diet, monitoring SaO2 to ensure you prevent any future heart failure exacerbation. Blood sugar log  Make an appt for a Monday with Sherrine Maples pharmacist to discuss diabetes. 2 month follow up with me.    Lab work  Please bring record of pneumonia vaccine.  Due for tetanus vaccine and sent to pharmacy

## 2015-12-30 NOTE — Assessment & Plan Note (Signed)
Last A1C 7 which is goal for patient. Advised to keep a BS log at home and patient will follow up with Adelina Mings, PharmD regarding SSI education. Pending A1C

## 2015-12-30 NOTE — Progress Notes (Signed)
Pre visit review using our clinic review tool, if applicable. No additional management support is needed unless otherwise documented below in the visit note. 

## 2015-12-30 NOTE — Assessment & Plan Note (Signed)
Symptomatically stable. SaO2 94. Weighs 8 pounds less today than prior. Reinforced education regarding daily weights , low salt, and monitoring SaO2. Will follow with CHF clinic.

## 2015-12-30 NOTE — Progress Notes (Signed)
Subjective:    Patient ID: [name], female    DOB: [birth year], 70 y.o.   MRN: [id number]  CC: [name] is a 70 y.o. female who presents today for physical exam.    HPI: Patient here for CPE. Feeling well.    10/12 -10/16 hospital admission for CHF with acute hypoxic respiratory failure. CHF Clinic next week. Appointment with GI 01/2016. Pending sleep study.   In patient Rehab 10/16- 10/29.   DM- Compliant with medications. Not keeping log of BS at this time.   CHF- Weight stable. No SOB or leg swelling today. Has scale and pulse Ox at house now. Home health nurses helping monitor SaO2, daily weight, and BP.   Falls-Continues to have recurrent falls and LE weakness. Had fall during stay at rehab. Started PT. No dizziness.         Colorectal Cancer Screening: Pending. Has been referred to GI due to anemia.  Breast Cancer Screening: Mammogram UTD Cervical Cancer Screening: h/o Hysterectomy. No abnormal Pap smears. No GYN cancer. No pelvic complaints today. Bone Health screening/DEXA for 65+: last 2015. Hip osteopenic -1.5> due to repeat 2017.  Lung Cancer Screening: Doesn't have 30 year pack year history and age > 55 years. Immunizations       Tetanus -Due         Pneumococcal - Due. Declined. Has had one in the past and will get me those records. Hepatitis C screening - Done Labs: Screening labs today. Exercise: Encouraged; using walker now as opposed to wheelchair. Alcohol use: None Smoking/tobacco use: Nonsmoker.  Regular dental exams: In need of dental exam. Wears seat belt: Yes.  HISTORY:  Past Medical History:  Diagnosis Date  . Anemia   . Asthma   . CHF (congestive heart failure) (HCC)   . CVA (cerebral vascular accident) (HCC)   . Diabetes mellitus without complication (HCC)   . GERD (gastroesophageal reflux disease)   . Heart murmur   . History of hiatal hernia   . Hypertension   . Panic attacks   . Peripheral vascular disease (HCC)   .  Restless leg syndrome   . Shortness of breath dyspnea     Past Surgical History:  Procedure Laterality Date  . ABDOMINAL HYSTERECTOMY    . BREAST BIOPSY Right yrs ago   benign  . ENDARTERECTOMY Right 07/24/2014   Procedure: ENDARTERECTOMY CAROTID;  Surgeon: Annice Needy, MD;  Location: [facility] ORS;  Service: Vascular;  Laterality: Right;  . EYE SURGERY Left    cataract  . FRACTURE SURGERY Left    fractured ankle  . TONSILLECTOMY     Family History  Problem Relation Age of Onset  . Pancreatic cancer Mother   . CAD Father   . Hypertension Father   . Pancreatic cancer Father   . CAD Brother   . Breast cancer Maternal Aunt     pt states several maternal aunts      ALLERGIES: Codeine; Liraglutide; and Wheat bran  Current Outpatient Prescriptions on File Prior to Visit  Medication Sig Dispense Refill  . acetaminophen (TYLENOL) 325 MG tablet Take 2 tablets (650 mg total) by mouth every 6 (six) hours as needed for mild pain (or Fever >/= 101).    Marland Kitchen albuterol (PROVENTIL) (2.5 MG/3ML) 0.083% nebulizer solution Take 3 mLs (2.5 mg total) by nebulization every 4 (four) hours as needed for wheezing. 75 mL 12  . aspirin EC 81 MG tablet Take 81 mg by mouth daily.    Marland Kitchen  clobetasol ointment (TEMOVATE) 0.05 % Apply to rash twice daily    . cyanocobalamin (,VITAMIN B-12,) 1000 MCG/ML injection 1000 mcg (1 mg) injection once per week for four weeks, followed by 1000 mcg injection once per month. (Patient taking differently: Inject 1,000 mcg into the skin every 30 (thirty) days. *Inject in shoulder*) 1 mL 9  . dapsone 25 MG tablet Take 25 mg by mouth daily.    . diclofenac (VOLTAREN) 50 MG EC tablet Take 50 mg by mouth 2 (two) times daily as needed for moderate pain.    Marland Kitchen. docusate sodium (COLACE) 100 MG capsule Take 1 capsule (100 mg total) by mouth 2 (two) times daily. 10 capsule 0  . ferrous sulfate 325 (65 FE) MG tablet Take 1 tablet (325 mg total) by mouth daily with breakfast. 30 tablet 3  .  fluconazole (DIFLUCAN) 150 MG tablet Take 150 mg by mouth every morning. For 3 weeks    . furosemide (LASIX) 40 MG tablet Take 1 tablet (40 mg total) by mouth 2 (two) times daily. (Patient taking differently: Take 40 mg by mouth 3 (three) times daily. ) 60 tablet 0  . gabapentin (NEURONTIN) 800 MG tablet Take 800 mg by mouth 2 (two) times daily.    . hydrochlorothiazide (HYDRODIURIL) 12.5 MG tablet Take 12.5 mg by mouth 2 (two) times daily.     . insulin aspart (NOVOLOG) 100 UNIT/ML injection Inject 0-15 Units into the skin 3 (three) times daily with meals. CBG < 70: implement hypoglycemia protocol CBG 70 - 120: 0 units CBG 121 - 150: 0 units CBG 151 - 200: 0 units CBG 201 - 250: 2 units CBG 251 - 300: 3 units CBG 301 - 350: 4 units CBG 351 - 400: 5 units CBG > 400: call MD and obtain STAT lab verification 10 mL 11  . loratadine (CLARITIN) 10 MG tablet Take 10 mg by mouth daily as needed for allergies.     Marland Kitchen. lovastatin (MEVACOR) 20 MG tablet Take 20 mg by mouth at bedtime.    . magnesium oxide (MAG-OX) 400 (241.3 Mg) MG tablet Take 1 tablet (400 mg total) by mouth daily. 7 tablet 0  . mupirocin cream (BACTROBAN) 2 % Apply 1 application topically 3 (three) times daily. (Patient not taking: Reported on 12/10/2015) 15 g 1  . mupirocin ointment (BACTROBAN) 2 % Place 1 application into the nose 2 (two) times daily. (Patient not taking: Reported on 12/10/2015) 22 g 0  . naproxen (NAPROSYN) 500 MG tablet Take 500 mg by mouth at bedtime.     Marland Kitchen. omeprazole (PRILOSEC) 40 MG capsule Take 40 mg by mouth at bedtime.     Marland Kitchen. oxyCODONE-acetaminophen (PERCOCET) 5-325 MG tablet Take 1 tablet by mouth every 8 (eight) hours as needed for moderate pain or severe pain. 30 tablet 0  . potassium chloride SA (K-DUR,KLOR-CON) 20 MEQ tablet Take 2 tablets (40 mEq total) by mouth daily. 30 tablet 2  . triamcinolone (KENALOG) 0.025 % cream Apply 1 application topically every Monday, Wednesday, and Friday.     No current  facility-administered medications on file prior to visit.     Social History  Substance Use Topics  . Smoking status: Never Smoker  . Smokeless tobacco: Never Used  . Alcohol use No    Review of Systems  Constitutional: Negative for chills, fever and unexpected weight change.  HENT: Negative for congestion.   Respiratory: Negative for cough and shortness of breath.   Cardiovascular: Negative for chest pain,  palpitations and leg swelling.  Gastrointestinal: Negative for nausea and vomiting.  Musculoskeletal: Negative for arthralgias and myalgias.  Skin: Negative for rash.  Neurological: Negative for dizziness and headaches.  Hematological: Negative for adenopathy.  Psychiatric/Behavioral: Negative for confusion.      Objective:    BP 134/70   Pulse 90   Temp 97.4 F (36.3 C) (Oral)   Ht 5\' 2"  (1.575 m)   Wt 240 lb 9.6 oz (109.1 kg)   SpO2 94%   BMI 44.01 kg/m   BP Readings from Last 3 Encounters:  12/30/15 134/70  12/14/15 (!) 123/52  12/04/15 128/68   Wt Readings from Last 3 Encounters:  12/30/15 240 lb 9.6 oz (109.1 kg)  12/14/15 252 lb 11.2 oz (114.6 kg)  12/04/15 260 lb (117.9 kg)    Physical Exam  Constitutional: She appears well-developed and well-nourished.  Eyes: Conjunctivae are normal.  Neck: No thyroid mass and no thyromegaly present.  Cardiovascular: Normal rate, regular rhythm, normal heart sounds and normal pulses.   Pulmonary/Chest: Effort normal and breath sounds normal. She has no wheezes. She has no rhonchi. She has no rales. Right breast exhibits no inverted nipple, no mass, no nipple discharge, no skin change and no tenderness. Left breast exhibits no inverted nipple, no mass, no nipple discharge, no skin change and no tenderness. Breasts are symmetrical.  CBE performed.   Lymphadenopathy:       Head (right side): No submental, no submandibular, no tonsillar, no preauricular, no posterior auricular and no occipital adenopathy present.        Head (left side): No submental, no submandibular, no tonsillar, no preauricular, no posterior auricular and no occipital adenopathy present.    She has no cervical adenopathy.       Right cervical: No superficial cervical, no deep cervical and no posterior cervical adenopathy present.      Left cervical: No superficial cervical, no deep cervical and no posterior cervical adenopathy present.    She has no axillary adenopathy.  Neurological: She is alert.  Skin: Skin is warm and dry.  Psychiatric: She has a normal mood and affect. Her speech is normal and behavior is normal. Thought content normal.  Vitals reviewed.      Assessment & Plan:   Problem List Items Addressed This Visit      Cardiovascular and Mediastinum   CHF (congestive heart failure) (HCC)    Symptomatically stable. SaO2 94. Weighs 8 pounds less today than prior. Reinforced education regarding daily weights , low salt, and monitoring SaO2. Will follow with CHF clinic.         Endocrine   Type 2 diabetes mellitus (HCC)    Last A1C 7 which is goal for patient. Advised to keep a BS log at home and patient will follow up with 02/03/16, PharmD regarding SSI education. Pending A1C        Other   Recurrent falls    Stable. Doing PT and walking more on her own. Congratulated patient on her hard work. Will continue to follow.       Routine physical examination - Primary    Pending GI appointment. Patient due for colonoscopy. Mammogram UTD. Deferred pelvic and pap as h/o hysterectomy and no pelvic complaints today. CBE performed. Repeat DEXA. Declined Prevnar and will get me those records. Will address Tdap at next visit. Encouraged walking with walker. Screening labs today. F/u 1-2 months.       Relevant Medications   Tdap (BOOSTRIX) 5-2.5-18.5 LF-MCG/0.5 injection  Other Relevant Orders   CBC with Differential/Platelet   Comprehensive metabolic panel   Hemoglobin A1c   Lipid panel   TSH   VITAMIN D 25 Hydroxy (Vit-D  Deficiency, Fractures)   Microalbumin / creatinine urine ratio   DG Bone Density    Other Visit Diagnoses   None.      I am having Ms. Mabile start on Tdap. I am also having her maintain her hydrochlorothiazide, omeprazole, lovastatin, aspirin EC, fluconazole, gabapentin, naproxen, loratadine, mupirocin cream, ferrous sulfate, mupirocin ointment, cyanocobalamin, clobetasol ointment, furosemide, potassium chloride SA, diclofenac, triamcinolone, dapsone, acetaminophen, albuterol, docusate sodium, insulin aspart, oxyCODONE-acetaminophen, and magnesium oxide.   Meds ordered this encounter  Medications  . DISCONTD: pneumococcal 13-valent conjugate vaccine (PREVNAR 13) injection 0.5 mL  . Tdap (BOOSTRIX) 5-2.5-18.5 LF-MCG/0.5 injection    Sig: Inject 0.5 mLs into the muscle once.    Dispense:  0.5 mL    Refill:  0    Order Specific Question:   Supervising Provider    Answer:   Sherlene Shams [[id number]]    Return precautions given.   Risks, benefits, and alternatives of the medications and treatment plan prescribed today were discussed, and patient expressed understanding.   Education regarding symptom management and diagnosis given to patient on AVS.   Continue to follow with Rennie Plowman, FNP for routine health maintenance.   Scherrie Gerlach Llewellyn and I agreed with plan.   Rennie Plowman, FNP

## 2015-12-30 NOTE — Assessment & Plan Note (Signed)
Stable. Doing PT and walking more on her own. Congratulated patient on her hard work. Will continue to follow.

## 2015-12-30 NOTE — Assessment & Plan Note (Signed)
Pending GI appointment. Patient due for colonoscopy. Mammogram UTD. Deferred pelvic and pap as h/o hysterectomy and no pelvic complaints today. CBE performed. Repeat DEXA. Declined Prevnar and will get me those records. Will address Tdap at next visit. Encouraged walking with walker. Screening labs today. F/u 1-2 months.

## 2015-12-31 ENCOUNTER — Other Ambulatory Visit: Payer: Medicare Other

## 2015-12-31 LAB — MICROALBUMIN / CREATININE URINE RATIO
Creatinine,U: 53 mg/dL
MICROALB/CREAT RATIO: 3.6 mg/g (ref 0.0–30.0)
Microalb, Ur: 1.9 mg/dL (ref 0.0–1.9)

## 2016-01-01 ENCOUNTER — Other Ambulatory Visit: Payer: Self-pay | Admitting: Family

## 2016-01-01 DIAGNOSIS — D72829 Elevated white blood cell count, unspecified: Secondary | ICD-10-CM

## 2016-01-01 DIAGNOSIS — R748 Abnormal levels of other serum enzymes: Secondary | ICD-10-CM

## 2016-01-04 ENCOUNTER — Ambulatory Visit: Payer: Medicare Other | Admitting: Pharmacist

## 2016-01-04 ENCOUNTER — Telehealth: Payer: Self-pay | Admitting: Family

## 2016-01-04 NOTE — Telephone Encounter (Signed)
Spoken to patient.  Patient is wondering that since her WBC are high, should she be on antibiotics.  Please advise.

## 2016-01-04 NOTE — Telephone Encounter (Signed)
Pt called and had a question regarding lab results. Thank you!  Call pt @ [phone]

## 2016-01-04 NOTE — Telephone Encounter (Signed)
Patient was informed.

## 2016-01-04 NOTE — Telephone Encounter (Signed)
MyChart message to patient - please call her.  I have similar concern with WBC and wanted to redraw them per message--  Hi [name],   Your labs came back, and overall they look great. However I would like to recheck your CBC and CMP in a couple of days as there were some elevations that have me concerned. You may call and make a lab appointment.    Vitamin D is normal.  Cholesterol looks great and improved. You may stay on the mevacor.  Your kidney function has decreased since we last checked it. This may be error or this may be kidney injury. Avoid all anti inflammatories such as ibuprofen, aleve, motrin etc as they will cause further insult. Your liver enzyme and alk phos remain elevated which support a diagnosis of fatty liver disease. This was seen on ultrasound as well in February of this year. There was also a 32m gallstone. PLEASE be sure to bring up and discuss these findings with the gastroenterologist who you will see in December so they can evaluate. Otherwise, if you have any abdominal pain, please let me know asap as this may mean the gallstone as moved.  White blood cell counts are doubled indicating an infection. Do you have any fever, chills, cough, pain with urination??  No diabetes. Thyroid is also normal.

## 2016-01-05 ENCOUNTER — Other Ambulatory Visit (INDEPENDENT_AMBULATORY_CARE_PROVIDER_SITE_OTHER): Payer: Medicare Other

## 2016-01-05 ENCOUNTER — Other Ambulatory Visit: Payer: Self-pay

## 2016-01-05 DIAGNOSIS — D72829 Elevated white blood cell count, unspecified: Secondary | ICD-10-CM | POA: Diagnosis not present

## 2016-01-05 DIAGNOSIS — R748 Abnormal levels of other serum enzymes: Secondary | ICD-10-CM | POA: Diagnosis not present

## 2016-01-05 LAB — COMPREHENSIVE METABOLIC PANEL
ALBUMIN: 3.1 g/dL — AB (ref 3.5–5.2)
ALT: 29 U/L (ref 0–35)
AST: 39 U/L — AB (ref 0–37)
Alkaline Phosphatase: 177 U/L — ABNORMAL HIGH (ref 39–117)
BUN: 26 mg/dL — AB (ref 6–23)
CHLORIDE: 89 meq/L — AB (ref 96–112)
CO2: 35 meq/L — AB (ref 19–32)
CREATININE: 1.38 mg/dL — AB (ref 0.40–1.20)
Calcium: 9.8 mg/dL (ref 8.4–10.5)
GFR: 40.18 mL/min — ABNORMAL LOW (ref >60.00)
Glucose, Bld: 217 mg/dL — ABNORMAL HIGH (ref 70–99)
Potassium: 4.3 mEq/L (ref 3.5–5.1)
SODIUM: 135 meq/L (ref 135–145)
Total Bilirubin: 0.7 mg/dL (ref 0.2–1.2)
Total Protein: 7.6 g/dL (ref 6.0–8.3)

## 2016-01-05 LAB — CBC WITH DIFFERENTIAL/PLATELET
BASOS PCT: 0.6 % (ref 0.0–3.0)
Basophils Absolute: 0.1 10*3/uL (ref 0.0–0.1)
EOS ABS: 0.5 10*3/uL (ref 0.0–0.7)
Eosinophils Relative: 4.5 % (ref 0.0–5.0)
HCT: 37 % (ref 36.0–46.0)
Hemoglobin: 11.9 g/dL — ABNORMAL LOW (ref 12.0–15.0)
Lymphocytes Relative: 18.1 % (ref 12.0–46.0)
Lymphs Abs: 2.1 10*3/uL (ref 0.7–4.0)
MCHC: 32.3 g/dL (ref 30.0–36.0)
MCV: 82.2 fl (ref 78.0–100.0)
MONO ABS: 0.9 10*3/uL (ref 0.1–1.0)
Monocytes Relative: 7.6 % (ref 3.0–12.0)
NEUTROS ABS: 7.9 10*3/uL — AB (ref 1.4–7.7)
Neutrophils Relative %: 69.2 % (ref 43.0–77.0)
PLATELETS: 272 10*3/uL (ref 150.0–400.0)
RBC: 4.5 Mil/uL (ref 3.87–5.11)
RDW: 21.7 % — AB (ref 11.5–15.5)
WBC: 11.4 10*3/uL — AB (ref 4.0–10.5)

## 2016-01-05 MED ORDER — GLIMEPIRIDE 2 MG PO TABS: 2.0000 mg | ORAL_TABLET | Freq: Every day | ORAL | 2 refills | Status: DC

## 2016-01-05 NOTE — Telephone Encounter (Signed)
Medication has been refilled per margaret.

## 2016-01-06 ENCOUNTER — Encounter: Payer: Self-pay | Admitting: Family

## 2016-01-06 ENCOUNTER — Ambulatory Visit: Payer: Medicare Other | Attending: Family | Admitting: Family

## 2016-01-06 VITALS — BP 116/59 | HR 94 | Resp 18 | Ht 62.0 in | Wt 241.0 lb

## 2016-01-06 DIAGNOSIS — K219 Gastro-esophageal reflux disease without esophagitis: Secondary | ICD-10-CM | POA: Insufficient documentation

## 2016-01-06 DIAGNOSIS — Z885 Allergy status to narcotic agent status: Secondary | ICD-10-CM | POA: Insufficient documentation

## 2016-01-06 DIAGNOSIS — G2581 Restless legs syndrome: Secondary | ICD-10-CM | POA: Diagnosis not present

## 2016-01-06 DIAGNOSIS — Z888 Allergy status to other drugs, medicaments and biological substances status: Secondary | ICD-10-CM | POA: Diagnosis not present

## 2016-01-06 DIAGNOSIS — Z8249 Family history of ischemic heart disease and other diseases of the circulatory system: Secondary | ICD-10-CM | POA: Diagnosis not present

## 2016-01-06 DIAGNOSIS — E1151 Type 2 diabetes mellitus with diabetic peripheral angiopathy without gangrene: Secondary | ICD-10-CM | POA: Diagnosis not present

## 2016-01-06 DIAGNOSIS — Z8673 Personal history of transient ischemic attack (TIA), and cerebral infarction without residual deficits: Secondary | ICD-10-CM | POA: Insufficient documentation

## 2016-01-06 DIAGNOSIS — E119 Type 2 diabetes mellitus without complications: Secondary | ICD-10-CM

## 2016-01-06 DIAGNOSIS — I110 Hypertensive heart disease with heart failure: Secondary | ICD-10-CM | POA: Insufficient documentation

## 2016-01-06 DIAGNOSIS — R296 Repeated falls: Secondary | ICD-10-CM | POA: Insufficient documentation

## 2016-01-06 DIAGNOSIS — Z794 Long term (current) use of insulin: Secondary | ICD-10-CM | POA: Diagnosis not present

## 2016-01-06 DIAGNOSIS — J45909 Unspecified asthma, uncomplicated: Secondary | ICD-10-CM | POA: Insufficient documentation

## 2016-01-06 DIAGNOSIS — I5032 Chronic diastolic (congestive) heart failure: Secondary | ICD-10-CM | POA: Diagnosis not present

## 2016-01-06 DIAGNOSIS — I10 Essential (primary) hypertension: Secondary | ICD-10-CM

## 2016-01-06 DIAGNOSIS — Z79899 Other long term (current) drug therapy: Secondary | ICD-10-CM | POA: Diagnosis not present

## 2016-01-06 DIAGNOSIS — Z7982 Long term (current) use of aspirin: Secondary | ICD-10-CM | POA: Insufficient documentation

## 2016-01-06 NOTE — Progress Notes (Signed)
Patient ID: [name], female    DOB: 02/04/1946, 70 y.o.   MRN: [id number]  HPI  [name] is a 70 y/o female with a history of restless leg syndrome, PVD, panic attacks, HTN, hiatal hernia, GERD, DM, CVA, asthma, anemia and chronic heart failure. No tobacco exposure.  Last echo was done 12/07/15 which showed an EF of 50% with mild MR, AR and TR.  Was last admitted on 12/10/15 with HF exacerbation due to indiscretion of sodium intake and fluid intake. Was diuresed with IV diuretics and had cardiology consult done while admitted. Was discharged to [facility] for PT. Had been in the ER on 11/15/15 due to dizziness and mechanical fall. Had another fall prior on 10/29/15 which resulted in an ER visit.  [name] presents today for [name] initial visit with fatigue that [name] describes as improving. Does have shortness of breath with exertion but denies any shortness of breath upon walking into the office today. Minimal swelling around [name] left ankle where [name] previously had ankle surgery. Is already weighing herself daily and says that [name] weight has been stable. Is now living at home and will be receiving PT and home health nursing services.  Past Medical History:  Diagnosis Date  . Anemia   . Asthma   . CHF (congestive heart failure) ([place])   . CVA (cerebral vascular accident) ([place])   . Diabetes mellitus without complication ([place])   . GERD (gastroesophageal reflux disease)   . Heart murmur   . History of hiatal hernia   . Hypertension   . Panic attacks   . Peripheral vascular disease ([place])   . Restless leg syndrome   . Shortness of breath dyspnea     Past Surgical History:  Procedure Laterality Date  . ABDOMINAL HYSTERECTOMY    . BREAST BIOPSY Right yrs ago   benign  . ENDARTERECTOMY Right 07/24/2014   Procedure: ENDARTERECTOMY CAROTID;  Surgeon: Algernon Huxley, MD;  Location: [facility] ORS;  Service: Vascular;  Laterality: Right;  . EYE SURGERY Left    cataract  . FRACTURE SURGERY Left    fractured ankle  . TONSILLECTOMY      Family History  Problem Relation Age of Onset  . Pancreatic cancer Mother   . CAD Father   . Hypertension Father   . Pancreatic cancer Father   . CAD Brother   . Breast cancer Maternal Aunt     pt states several maternal aunts    Social History  Substance Use Topics  . Smoking status: Never Smoker  . Smokeless tobacco: Never Used  . Alcohol use No    Allergies  Allergen Reactions  . Codeine Other (See Comments)    Reaction:  Dizziness   . Liraglutide     Other reaction(s): nausea and stomach pain  . Wheat Bran Other (See Comments)    Reaction:  Sneezing     Prior to Admission medications   Medication Sig Start Date End Date Taking? Authorizing Provider  acetaminophen (TYLENOL) 325 MG tablet Take 2 tablets (650 mg total) by mouth every 6 (six) hours as needed for mild pain (or Fever >/= 101). 12/14/15  Yes Nicholes Mango, MD  albuterol (PROVENTIL) (2.5 MG/3ML) 0.083% nebulizer solution Take 3 mLs (2.5 mg total) by nebulization every 4 (four) hours as needed for wheezing. 12/14/15  Yes Nicholes Mango, MD  aspirin EC 81 MG tablet Take 81 mg by mouth daily.   Yes Historical Provider, MD  Cholecalciferol (D3 SUPER STRENGTH)  2000 units CAPS Take 2,000 Units by mouth daily.   Yes Historical Provider, MD  clobetasol ointment (TEMOVATE) 0.05 % Apply to rash twice daily 11/10/15  Yes Historical Provider, MD  Cyanocobalamin (B-12) 1000 MCG/ML KIT Inject 1,000 mcg as directed every 30 (thirty) days. 02/02/16  Yes Historical Provider, MD  diclofenac (VOLTAREN) 50 MG EC tablet Take 50 mg by mouth 2 (two) times daily as needed for moderate pain.   Yes Historical Provider, MD  ferrous sulfate 325 (65 FE) MG tablet Take 1 tablet (325 mg total) by mouth daily with breakfast. 11/03/15  Yes Burnard Hawthorne, FNP  furosemide (LASIX) 40 MG tablet Take 1 tablet (40 mg total) by mouth 2 (two) times daily. 12/10/15  Yes Burnard Hawthorne, FNP  gabapentin (NEURONTIN) 800  MG tablet Take 800 mg by mouth 2 (two) times daily.   Yes Historical Provider, MD  glimepiride (AMARYL) 2 MG tablet Take 1 tablet (2 mg total) by mouth daily with breakfast. Patient taking differently: Take 2 mg by mouth 2 (two) times daily.  01/05/16  Yes Burnard Hawthorne, FNP  hydrochlorothiazide (HYDRODIURIL) 12.5 MG tablet Take 12.5 mg by mouth 2 (two) times daily.    Yes Historical Provider, MD  hydrOXYzine (ATARAX/VISTARIL) 25 MG tablet Take 25 mg by mouth 2 (two) times daily.   Yes Historical Provider, MD  insulin aspart (NOVOLOG) 100 UNIT/ML injection Inject 0-15 Units into the skin 3 (three) times daily with meals. CBG < 70: implement hypoglycemia protocol CBG 70 - 120: 0 units CBG 121 - 150: 0 units CBG 151 - 200: 0 units CBG 201 - 250: 2 units CBG 251 - 300: 3 units CBG 301 - 350: 4 units CBG 351 - 400: 5 units CBG > 400: call MD and obtain STAT lab verification 12/14/15  Yes Nicholes Mango, MD  loratadine (CLARITIN) 10 MG tablet Take 10 mg by mouth daily as needed for allergies.    Yes Historical Provider, MD  lovastatin (MEVACOR) 20 MG tablet Take 20 mg by mouth at bedtime.   Yes Historical Provider, MD  magnesium oxide (MAG-OX) 400 (241.3 Mg) MG tablet Take 1 tablet (400 mg total) by mouth daily. 12/14/15  Yes Nicholes Mango, MD  naproxen (NAPROSYN) 500 MG tablet Take 500 mg by mouth at bedtime.    Yes Historical Provider, MD  omeprazole (PRILOSEC) 40 MG capsule Take 40 mg by mouth at bedtime.    Yes Historical Provider, MD  oxyCODONE-acetaminophen (PERCOCET) 5-325 MG tablet Take 1 tablet by mouth every 8 (eight) hours as needed for moderate pain or severe pain. 12/14/15  Yes Nicholes Mango, MD  potassium chloride SA (K-DUR,KLOR-CON) 20 MEQ tablet Take 2 tablets (40 mEq total) by mouth daily. 12/10/15  Yes Burnard Hawthorne, FNP  triamcinolone (KENALOG) 0.025 % cream Apply 1 application topically every Monday, Wednesday, and Friday. 12/03/15 12/02/16 Yes Historical Provider, MD  white  petrolatum (VASELINE) GEL Apply 1 application topically as needed for lip care.   Yes Historical Provider, MD     Review of Systems  Constitutional: Positive for fatigue ("better"). Negative for appetite change.  HENT: Negative for congestion, postnasal drip and sore throat.   Eyes: Negative.   Respiratory: Positive for shortness of breath. Negative for cough and chest tightness.   Cardiovascular: Positive for leg swelling (little bit around left ankle). Negative for chest pain and palpitations.  Gastrointestinal: Negative for abdominal distention and abdominal pain.  Endocrine: Negative.   Genitourinary: Negative.   Musculoskeletal: Negative  for back pain and neck pain.  Skin: Positive for rash (right hand, belly). Negative for color change.  Allergic/Immunologic: Negative.   Neurological: Positive for light-headedness. Negative for dizziness.  Hematological: Negative for adenopathy. Bruises/bleeds easily.  Psychiatric/Behavioral: Positive for sleep disturbance (sleeping on 2-3 pillows). Negative for dysphoric mood. The patient is nervous/anxious.    Vitals:   01/06/16 1022  BP: (!) 116/59  Pulse: 94  Resp: 18  SpO2: 96%  Weight: 241 lb (109.3 kg)  Height: 5' 2"  (1.575 m)    Wt Readings from Last 3 Encounters:  01/06/16 241 lb (109.3 kg)  12/30/15 240 lb 9.6 oz (109.1 kg)  12/14/15 252 lb 11.2 oz (114.6 kg)    Lab Results  Component Value Date   CREATININE 1.38 (H) 01/05/2016   CREATININE 1.27 (H) 12/30/2015   CREATININE 0.85 12/14/2015      Physical Exam  Constitutional: [name] is oriented to person, place, and time. [name] appears well-developed and well-nourished.  HENT:  Head: Normocephalic and atraumatic.  Eyes: Conjunctivae are normal. Pupils are equal, round, and reactive to light.  Neck: Normal range of motion. Neck supple. No JVD present.  Cardiovascular: Regular rhythm.  Tachycardia present.   Pulmonary/Chest: Effort normal. [name] has no wheezes. [name] has no  rales.  Abdominal: Soft. [name] exhibits no distension. There is no tenderness.  Musculoskeletal: [name] exhibits edema (trace amount around left ankle). [name] exhibits no tenderness.  Neurological: [name] is alert and oriented to person, place, and time.  Skin: Skin is warm and dry.  Psychiatric: [name] has a normal mood and affect. [name] behavior is normal. Thought content normal.  Nursing note and vitals reviewed.    Assessment & Plan:  1: Chronic heart failure with preserved ejection fraction- - NYHA class II - euvolemic today - weighing daily already. Instructed to write weight down on weight chart and call for an overnight weight gain of >2 pounds or a weekly weight gain of >5 pounds - not using salt. Using pepper and salt substitute. Written dietary information about a 2070m sodium diet given to [name] - drinking about 30-40 ounces of fluid daily - pharmD went in and reviewed medications with the patient - sees cardiologist (Clayborn Bigness on 03/28/16 - received [name] flu vaccine for this season already - sleep study results pending per PCP's note - advised to use naproxen sparingly  2: HTN- - BP looks great today - continue medications  3: Diabetes- - glucose this morning was 127 - follows closely with PCP regarding this - sees PCP (Vidal Schwalbe on 02/25/16 - home health nurse is supposed to come out today  4: Recurrent falls- - now living at home (was at [facility] - PT coming to the home [name] thinks twice weekly - walking with [name] rolling walker  Return in 1 month or sooner for any questions/problems before then.

## 2016-01-06 NOTE — Patient Instructions (Signed)
Continue weighing daily and call for an overnight weight gain of > 2 pounds or a weekly weight gain of >5 pounds. 

## 2016-01-13 ENCOUNTER — Encounter: Payer: Self-pay | Admitting: Family

## 2016-01-14 ENCOUNTER — Telehealth: Payer: Self-pay | Admitting: *Deleted

## 2016-01-14 NOTE — Telephone Encounter (Signed)
Please call [name]-  I assume that home health will be assessing vitals at every visit.   Vital perimeters: SaO2 > or equal to 94% HR 60-99 bpm Respiration 16-20 bpm BP < 140/90 Blood sugar fasting < 200 Overnight weight gain greater than 2 pounds; would be concerned for CHF exacerbation. Temp. 96-98.9 F

## 2016-01-14 NOTE — Telephone Encounter (Signed)
Please advise 

## 2016-01-14 NOTE — Telephone Encounter (Signed)
Marylene Land from [facility] stated that home health has started for 2 times a week for 5 weeks and 1 time a week for 4 weeks. She also has been signed up with the agencies cardio and pulmonary program. Marylene Land requested to know if Claris Che would like to set vital sign perimeters for patient.  Contact Marylene Land   [phone], a message can be left on voicemail-voicemail is secured per Marylene Land

## 2016-01-15 NOTE — Telephone Encounter (Signed)
[name] has been informed.

## 2016-01-15 NOTE — Telephone Encounter (Signed)
Left message for angela to return call back. 

## 2016-01-25 ENCOUNTER — Ambulatory Visit: Payer: Medicare Other | Admitting: Pharmacist

## 2016-01-27 ENCOUNTER — Telehealth: Payer: Self-pay | Admitting: Family

## 2016-01-27 NOTE — Telephone Encounter (Signed)
Left message for patient to return call back.  

## 2016-01-27 NOTE — Telephone Encounter (Signed)
courtesy call to pt-  How is she doing?  I see she missed skilled nursing visit 11/22.Marland KitchenMarland KitchenMarland Kitchen

## 2016-01-28 NOTE — Telephone Encounter (Signed)
Noted.  Will follow this at next OV

## 2016-01-28 NOTE — Telephone Encounter (Signed)
Patient stated she is doing ok except the past two couple days she stated she is starting to feel week and slid in the floor yesterday.  But overall she stated shes doing very well.

## 2016-01-29 ENCOUNTER — Emergency Department
Admission: EM | Admit: 2016-01-29 | Discharge: 2016-01-29 | Disposition: A | Discharge status: Home / Self Care | Payer: Medicare Other | Attending: Emergency Medicine | Admitting: Emergency Medicine

## 2016-01-29 ENCOUNTER — Encounter: Payer: Self-pay | Admitting: Emergency Medicine

## 2016-01-29 ENCOUNTER — Emergency Department: Payer: Medicare Other

## 2016-01-29 DIAGNOSIS — Z794 Long term (current) use of insulin: Secondary | ICD-10-CM | POA: Diagnosis not present

## 2016-01-29 DIAGNOSIS — E119 Type 2 diabetes mellitus without complications: Secondary | ICD-10-CM | POA: Diagnosis not present

## 2016-01-29 DIAGNOSIS — Y999 Unspecified external cause status: Secondary | ICD-10-CM | POA: Insufficient documentation

## 2016-01-29 DIAGNOSIS — W07XXXA Fall from chair, initial encounter: Secondary | ICD-10-CM | POA: Insufficient documentation

## 2016-01-29 DIAGNOSIS — S79912A Unspecified injury of left hip, initial encounter: Secondary | ICD-10-CM | POA: Diagnosis present

## 2016-01-29 DIAGNOSIS — Z7982 Long term (current) use of aspirin: Secondary | ICD-10-CM | POA: Insufficient documentation

## 2016-01-29 DIAGNOSIS — M25562 Pain in left knee: Secondary | ICD-10-CM | POA: Insufficient documentation

## 2016-01-29 DIAGNOSIS — Y929 Unspecified place or not applicable: Secondary | ICD-10-CM | POA: Diagnosis not present

## 2016-01-29 DIAGNOSIS — I509 Heart failure, unspecified: Secondary | ICD-10-CM | POA: Diagnosis not present

## 2016-01-29 DIAGNOSIS — I110 Hypertensive heart disease with heart failure: Secondary | ICD-10-CM | POA: Diagnosis not present

## 2016-01-29 DIAGNOSIS — W19XXXA Unspecified fall, initial encounter: Secondary | ICD-10-CM

## 2016-01-29 DIAGNOSIS — M25552 Pain in left hip: Secondary | ICD-10-CM | POA: Diagnosis not present

## 2016-01-29 DIAGNOSIS — Z79899 Other long term (current) drug therapy: Secondary | ICD-10-CM | POA: Diagnosis not present

## 2016-01-29 DIAGNOSIS — Y939 Activity, unspecified: Secondary | ICD-10-CM | POA: Diagnosis not present

## 2016-01-29 DIAGNOSIS — J45909 Unspecified asthma, uncomplicated: Secondary | ICD-10-CM | POA: Diagnosis not present

## 2016-01-29 LAB — URINALYSIS COMPLETE WITH MICROSCOPIC (ARMC ONLY)
BILIRUBIN URINE: NEGATIVE
HGB URINE DIPSTICK: NEGATIVE
KETONES UR: NEGATIVE mg/dL
LEUKOCYTES UA: NEGATIVE
NITRITE: NEGATIVE
PH: 6 (ref 5.0–8.0)
Protein, ur: NEGATIVE mg/dL
Specific Gravity, Urine: 1.017 (ref 1.005–1.030)

## 2016-01-29 MED ORDER — ACETAMINOPHEN 325 MG PO TABS
650.0000 mg | ORAL_TABLET | Freq: Once | ORAL | Status: AC
  Administered 2016-01-29: 650 mg via ORAL
  Filled 2016-01-29: qty 2

## 2016-01-29 NOTE — Discharge Instructions (Signed)
Please continue with all your current medications. You can use a knee wrap for the time being. Please contact her primary physician and if knee and hip pain continued may need orthopedic surgery assessment.

## 2016-01-29 NOTE — ED Triage Notes (Addendum)
Pt reports fell today when transferring from chair to bed. Pt denies dizziness or lightheadedness prior to fall. Pt reports her legs gave out from under her. Pt reports pain in her left knee and left hip and left pelvis and tailbone. Pt caregiver states new onset of urinary incontinence this week. Pt denies painful urination but states she has noticed a foul odor to her urine. Pt here with her in-home caregivers.

## 2016-01-29 NOTE — ED Notes (Signed)
Pt assisted out of bed and to standing. Able to bear weight. States there is some pain but she feels ready to go home

## 2016-02-01 ENCOUNTER — Telehealth: Payer: Self-pay | Admitting: Family

## 2016-02-01 ENCOUNTER — Ambulatory Visit: Payer: Medicare Other | Admitting: Family

## 2016-02-01 DIAGNOSIS — Z0289 Encounter for other administrative examinations: Secondary | ICD-10-CM

## 2016-02-01 NOTE — Telephone Encounter (Signed)
Pt called and needs a refill on metformin. Pt cannot find the bottle with the dosage stated she filled it last month. Please advise, thank you!  Pharmacy - [facility] [address], [place] - [address]  Call pt @ [phone]

## 2016-02-02 ENCOUNTER — Encounter: Payer: Self-pay | Admitting: Family

## 2016-02-02 ENCOUNTER — Ambulatory Visit (INDEPENDENT_AMBULATORY_CARE_PROVIDER_SITE_OTHER): Payer: Medicare Other | Admitting: Family

## 2016-02-02 ENCOUNTER — Ambulatory Visit: Payer: Medicare Other | Attending: Family | Admitting: Family

## 2016-02-02 VITALS — BP 128/68 | HR 88 | Temp 97.5°F | Ht 62.0 in | Wt 232.8 lb

## 2016-02-02 VITALS — BP 103/44 | HR 80 | Resp 20 | Ht 62.0 in | Wt 207.0 lb

## 2016-02-02 DIAGNOSIS — Z79899 Other long term (current) drug therapy: Secondary | ICD-10-CM | POA: Diagnosis not present

## 2016-02-02 DIAGNOSIS — R296 Repeated falls: Secondary | ICD-10-CM | POA: Diagnosis not present

## 2016-02-02 DIAGNOSIS — I5032 Chronic diastolic (congestive) heart failure: Secondary | ICD-10-CM | POA: Diagnosis not present

## 2016-02-02 DIAGNOSIS — E1151 Type 2 diabetes mellitus with diabetic peripheral angiopathy without gangrene: Secondary | ICD-10-CM | POA: Insufficient documentation

## 2016-02-02 DIAGNOSIS — E119 Type 2 diabetes mellitus without complications: Secondary | ICD-10-CM

## 2016-02-02 DIAGNOSIS — Z8673 Personal history of transient ischemic attack (TIA), and cerebral infarction without residual deficits: Secondary | ICD-10-CM | POA: Diagnosis not present

## 2016-02-02 DIAGNOSIS — K219 Gastro-esophageal reflux disease without esophagitis: Secondary | ICD-10-CM | POA: Insufficient documentation

## 2016-02-02 DIAGNOSIS — I10 Essential (primary) hypertension: Secondary | ICD-10-CM

## 2016-02-02 DIAGNOSIS — Z794 Long term (current) use of insulin: Secondary | ICD-10-CM

## 2016-02-02 DIAGNOSIS — Z800 Family history of malignant neoplasm of digestive organs: Secondary | ICD-10-CM | POA: Diagnosis not present

## 2016-02-02 DIAGNOSIS — Z8249 Family history of ischemic heart disease and other diseases of the circulatory system: Secondary | ICD-10-CM | POA: Diagnosis not present

## 2016-02-02 DIAGNOSIS — G2581 Restless legs syndrome: Secondary | ICD-10-CM | POA: Diagnosis not present

## 2016-02-02 DIAGNOSIS — I110 Hypertensive heart disease with heart failure: Secondary | ICD-10-CM | POA: Diagnosis not present

## 2016-02-02 DIAGNOSIS — Z888 Allergy status to other drugs, medicaments and biological substances status: Secondary | ICD-10-CM | POA: Insufficient documentation

## 2016-02-02 DIAGNOSIS — J45909 Unspecified asthma, uncomplicated: Secondary | ICD-10-CM | POA: Insufficient documentation

## 2016-02-02 DIAGNOSIS — Z885 Allergy status to narcotic agent status: Secondary | ICD-10-CM | POA: Insufficient documentation

## 2016-02-02 DIAGNOSIS — Z7982 Long term (current) use of aspirin: Secondary | ICD-10-CM | POA: Insufficient documentation

## 2016-02-02 MED ORDER — INSULIN GLARGINE 100 UNIT/ML ~~LOC~~ SOLN
10.0000 [IU] | Freq: Every day | SUBCUTANEOUS | 3 refills | Status: DC
Start: 2016-02-02 — End: 2016-02-04

## 2016-02-02 NOTE — Assessment & Plan Note (Signed)
Reordered Home PT and fall prevention evaluation. Suspect falls are multifactorial including deconditioning and obesity.

## 2016-02-02 NOTE — Progress Notes (Signed)
Patient ID: [name], female    DOB: 1945-12-20, 70 y.o.   MRN: [id number]  HPI  [name] is a 70 y/o female with a history of restless leg syndrome, PVD, panic attacks, HTN, hiatal hernia, GERD, DM, CVA, asthma, anemia and chronic heart failure. No tobacco exposure.  Last echo was done 12/07/15 which showed an EF of 50% with mild MR, AR and TR.  Was in the ED on 01/29/16 after a mechanical fall. Discharged from the ED. Was last admitted on 12/10/15 with HF exacerbation due to indiscretion of sodium intake and fluid intake. Was diuresed with IV diuretics and had cardiology consult done while admitted. Was discharged to [facility] for PT. Had been in the ER on 11/15/15 due to dizziness and mechanical fall. Had another fall prior on 10/29/15 which resulted in an ER visit.  She presents today for her follow-up visit with fatigue and shortness of breath with little exertion. Minimal swelling around her left ankle where she previously had ankle surgery. Biggest concern is of her worsening weakness in both of her legs and her back. She feels very unstable standing for very long. Does use her walker at home but even that requires assistance. Her walker won't fit in the doorway of her bathroom and when she lets go, she feels her legs just "give out".   Past Medical History:  Diagnosis Date  . Anemia   . Asthma   . CHF (congestive heart failure) ([place])   . CVA (cerebral vascular accident) ([place])   . Diabetes mellitus without complication ([place])   . GERD (gastroesophageal reflux disease)   . Heart murmur   . History of hiatal hernia   . Hypertension   . Panic attacks   . Peripheral vascular disease ([place])   . Restless leg syndrome   . Shortness of breath dyspnea     Past Surgical History:  Procedure Laterality Date  . ABDOMINAL HYSTERECTOMY    . BREAST BIOPSY Right yrs ago   benign  . ENDARTERECTOMY Right 07/24/2014   Procedure: ENDARTERECTOMY CAROTID;  Surgeon: Algernon Huxley, MD;   Location: [facility] ORS;  Service: Vascular;  Laterality: Right;  . EYE SURGERY Left    cataract  . FRACTURE SURGERY Left    fractured ankle  . TONSILLECTOMY      Family History  Problem Relation Age of Onset  . Pancreatic cancer Mother   . CAD Father   . Hypertension Father   . Pancreatic cancer Father   . CAD Brother   . Breast cancer Maternal Aunt     pt states several maternal aunts    Social History  Substance Use Topics  . Smoking status: Never Smoker  . Smokeless tobacco: Never Used  . Alcohol use No    Allergies  Allergen Reactions  . Codeine Other (See Comments)    Reaction:  Dizziness   . Liraglutide     Other reaction(s): nausea and stomach pain  . Wheat Bran Other (See Comments)    Reaction:  Sneezing     Prior to Admission medications   Medication Sig Start Date End Date Taking? Authorizing Provider  acetaminophen (TYLENOL) 325 MG tablet Take 2 tablets (650 mg total) by mouth every 6 (six) hours as needed for mild pain (or Fever >/= 101). 12/14/15  Yes Nicholes Mango, MD  albuterol (PROVENTIL) (2.5 MG/3ML) 0.083% nebulizer solution Take 3 mLs (2.5 mg total) by nebulization every 4 (four) hours as needed for wheezing. 12/14/15  Yes Nicholes Mango, MD  aspirin EC 81 MG tablet Take 81 mg by mouth daily.   Yes Historical Provider, MD  Cholecalciferol (D3 SUPER STRENGTH) 2000 units CAPS Take 2,000 Units by mouth daily.   Yes Historical Provider, MD  clobetasol ointment (TEMOVATE) 0.05 % Apply to rash twice daily 11/10/15  Yes Historical Provider, MD  Cyanocobalamin (B-12) 1000 MCG/ML KIT Inject 1,000 mcg as directed every 30 (thirty) days. 02/02/16  Yes Historical Provider, MD  ferrous sulfate 325 (65 FE) MG tablet Take 1 tablet (325 mg total) by mouth daily with breakfast. 11/03/15  Yes Burnard Hawthorne, FNP  furosemide (LASIX) 40 MG tablet Take 1 tablet (40 mg total) by mouth 2 (two) times daily. 12/10/15  Yes Burnard Hawthorne, FNP  gabapentin (NEURONTIN) 800 MG tablet Take  800 mg by mouth 2 (two) times daily.   Yes Historical Provider, MD  glimepiride (AMARYL) 2 MG tablet Take 1 tablet (2 mg total) by mouth daily with breakfast. Patient taking differently: Take 2 mg by mouth 2 (two) times daily.  01/05/16  Yes Burnard Hawthorne, FNP  hydrochlorothiazide (HYDRODIURIL) 12.5 MG tablet Take 12.5 mg by mouth 2 (two) times daily.    Yes Historical Provider, MD  insulin aspart (NOVOLOG) 100 UNIT/ML injection Inject 0-15 Units into the skin 3 (three) times daily with meals. CBG < 70: implement hypoglycemia protocol CBG 70 - 120: 0 units CBG 121 - 150: 0 units CBG 151 - 200: 0 units CBG 201 - 250: 2 units CBG 251 - 300: 3 units CBG 301 - 350: 4 units CBG 351 - 400: 5 units CBG > 400: call MD and obtain STAT lab verification 12/14/15  Yes Nicholes Mango, MD  loratadine (CLARITIN) 10 MG tablet Take 10 mg by mouth daily as needed for allergies.    Yes Historical Provider, MD  lovastatin (MEVACOR) 20 MG tablet Take 20 mg by mouth at bedtime.   Yes Historical Provider, MD  magnesium oxide (MAG-OX) 400 (241.3 Mg) MG tablet Take 1 tablet (400 mg total) by mouth daily. 12/14/15  Yes Nicholes Mango, MD  omeprazole (PRILOSEC) 40 MG capsule Take 40 mg by mouth at bedtime.    Yes Historical Provider, MD  oxyCODONE-acetaminophen (PERCOCET) 5-325 MG tablet Take 1 tablet by mouth every 8 (eight) hours as needed for moderate pain or severe pain. 12/14/15  Yes Nicholes Mango, MD  potassium chloride SA (K-DUR,KLOR-CON) 20 MEQ tablet Take 2 tablets (40 mEq total) by mouth daily. 12/10/15  Yes Burnard Hawthorne, FNP  triamcinolone (KENALOG) 0.025 % cream Apply 1 application topically every Monday, Wednesday, and Friday. 12/03/15 12/02/16 Yes Historical Provider, MD  white petrolatum (VASELINE) GEL Apply 1 application topically as needed for lip care.   Yes Historical Provider, MD  insulin glargine (LANTUS) 100 UNIT/ML injection Inject 0.1 mLs (10 Units total) into the skin at bedtime. 02/02/16   Burnard Hawthorne, FNP     Review of Systems  Constitutional: Positive for fatigue. Negative for appetite change.  HENT: Negative for congestion, postnasal drip and sore throat.   Eyes: Negative.   Respiratory: Positive for shortness of breath. Negative for chest tightness.   Cardiovascular: Negative for chest pain, palpitations and leg swelling.  Gastrointestinal: Negative for abdominal distention and abdominal pain.  Endocrine: Negative.   Genitourinary: Negative.   Musculoskeletal: Negative for back pain and neck pain.  Skin: Positive for rash (lower abdomen).  Allergic/Immunologic: Negative.   Neurological: Positive for weakness (both legs/back). Negative for  dizziness and light-headedness.  Hematological: Negative for adenopathy. Does not bruise/bleed easily.  Psychiatric/Behavioral: Negative for dysphoric mood and sleep disturbance (in recliner due to back weakness & recent fall). The patient is not nervous/anxious.    Vitals:   02/02/16 1150  BP: (!) 103/44  Pulse: 80  Resp: 20  SpO2: 97%  Weight: 207 lb (93.9 kg)  Height: 5' 2" (1.575 m)   Wt Readings from Last 3 Encounters:  02/02/16 232 lb 12.8 oz (105.6 kg)  01/29/16 243 lb (110.2 kg)  01/06/16 241 lb (109.3 kg)    Lab Results  Component Value Date   CREATININE 1.38 (H) 01/05/2016   CREATININE 1.27 (H) 12/30/2015   CREATININE 0.85 12/14/2015   Physical Exam  Constitutional: She is oriented to person, place, and time. She appears well-developed and well-nourished.  HENT:  Head: Normocephalic and atraumatic.  Eyes: Conjunctivae are normal. Pupils are equal, round, and reactive to light.  Neck: Normal range of motion. Neck supple.  Cardiovascular: Normal rate and regular rhythm.   Pulmonary/Chest: Effort normal. She has no wheezes. She has no rales.  Abdominal: Soft. She exhibits no distension. There is no tenderness.  Musculoskeletal: She exhibits no edema or tenderness.  Neurological: She is alert and oriented to  person, place, and time.  Skin: Skin is warm and dry.  Psychiatric: She has a normal mood and affect. Her behavior is normal. Thought content normal.  Nursing note and vitals reviewed.    Assessment & Plan:  1: Chronic heart failure with preserved ejection fraction- - NYHA class II - euvolemic today - continues to weigh at home but with assistance. Call for an overnight weight gain of >2 pounds or a weekly weight gain of >5 pounds.  - came into the office today in a wheelchair (last time she walked in) and didn't feel safe about standing on the scale. States she thinks her last weight was 207 pounds but unsure of accuracy of that. - not using salt. Using pepper and salt substitute. Written dietary information about a 2037m sodium diet given to her - drinking about 30-40 ounces of fluid daily - sees cardiologist (Clayborn Bigness on 03/28/16 - received her flu vaccine for this season already - advised to use naproxen sparingly  2: HTN- - BP looks great today - continue medications  3: Diabetes- - glucose this morning was 334 - has been out of metformin for the about the last week. States that sometimes her meter won't even give her a reading due to it being high - admits to eating more sweet foods - sees PCP (Psychologist, prison and probation services later today  4: Recurrent falls- - now living at home (was at [facility] - has around the clock caregiver - no longer receiving PT and feels like the legs/back are becoming more weak where she's falling more often and becoming less active - encouraged her to speak with PCP today about resuming PT - came into the office in a wheelchair versus last time walking in with her walker  Return here in 3 months or sooner for any questions/problems before then.

## 2016-02-02 NOTE — Patient Instructions (Addendum)
   INSULIN FAST ACTING  NOVOLOG  Inject 0-15 Units into the skin 3 (three) times daily with meals. CBG < 70: implement hypoglycemia protocol  CBG 70 - 120: 0 units  CBG 121 - 150: 0 units  CBG 151 - 200: 0 units   CBG 201 - 250: 2 units  CBG 251 - 300: 3 units  CBG 301 - 350: 4 units  CBG 351 - 400: 5 units  CBG > 400: call MD and obtain    INSULIN LONG ACTING: LANTUS Give 10 units with supper/evening.  Follow up on Monday with sugars. .   Your anemia is ALMOST normal which is great to see and much improved since I met you.  Your liver enzyme and alk phos remain elevated which support a diagnosis of fatty liver disease. This was seen on ultrasound as well in February of this year. There was also a 35m gallstone. PLEASE be sure to bring up and discuss these findings with the gastroenterologist who you will see in December so they can evaluate. Otherwise, if you have any abdominal pain, please let me know asap as this may mean the gallstone as moved.

## 2016-02-02 NOTE — Assessment & Plan Note (Signed)
Referral for bariatric consult.

## 2016-02-02 NOTE — Progress Notes (Signed)
Subjective:    Patient ID: [name], female    DOB: 11-23-1945, 70 y.o.   MRN: [id number]  CC: [name] is a 70 y.o. female who presents today for follow up.   HPI: CC:  weakness in legs . Accompanied by caregiver, has 7 day a week around the clock care, indefinitely. Has seen PT twice at her house; and no longer coming. Would like to continue services.   Fall onto left knee; ED visit 12/1. Reviewed left knee and hip xrays; negative for fracture, revealed OA.Left pain is resolved.   In wheelchair 'more and more'. Using walker some.   HF- No cough, le swelling. Was seen earlier today at Heart Failure clinic.   DM-Fasting BS 300's. After BS glucometer not reading. Has been eating sweets. Not taking SSI  Pending initial consult with GI 12/12 for anemia    HISTORY:  Past Medical History:  Diagnosis Date  . Anemia   . Asthma   . CHF (congestive heart failure) ([place])   . CVA (cerebral vascular accident) ([place])   . Diabetes mellitus without complication ([place])   . GERD (gastroesophageal reflux disease)   . Heart murmur   . History of hiatal hernia   . Hypertension   . Panic attacks   . Peripheral vascular disease ([place])   . Restless leg syndrome   . Shortness of breath dyspnea    Past Surgical History:  Procedure Laterality Date  . ABDOMINAL HYSTERECTOMY    . BREAST BIOPSY Right yrs ago   benign  . ENDARTERECTOMY Right 07/24/2014   Procedure: ENDARTERECTOMY CAROTID;  Surgeon: Algernon Huxley, MD;  Location: [facility] ORS;  Service: Vascular;  Laterality: Right;  . EYE SURGERY Left    cataract  . FRACTURE SURGERY Left    fractured ankle  . TONSILLECTOMY     Family History  Problem Relation Age of Onset  . Pancreatic cancer Mother   . CAD Father   . Hypertension Father   . Pancreatic cancer Father   . CAD Brother   . Breast cancer Maternal Aunt     pt states several maternal aunts    Allergies: Codeine; Liraglutide; and Wheat bran Current Outpatient  Prescriptions on File Prior to Visit  Medication Sig Dispense Refill  . acetaminophen (TYLENOL) 325 MG tablet Take 2 tablets (650 mg total) by mouth every 6 (six) hours as needed for mild pain (or Fever >/= 101).    Marland Kitchen albuterol (PROVENTIL) (2.5 MG/3ML) 0.083% nebulizer solution Take 3 mLs (2.5 mg total) by nebulization every 4 (four) hours as needed for wheezing. 75 mL 12  . aspirin EC 81 MG tablet Take 81 mg by mouth daily.    . Cholecalciferol (D3 SUPER STRENGTH) 2000 units CAPS Take 2,000 Units by mouth daily.    . clobetasol ointment (TEMOVATE) 0.05 % Apply to rash twice daily    . Cyanocobalamin (B-12) 1000 MCG/ML KIT Inject 1,000 mcg as directed every 30 (thirty) days.    . ferrous sulfate 325 (65 FE) MG tablet Take 1 tablet (325 mg total) by mouth daily with breakfast. 30 tablet 3  . furosemide (LASIX) 40 MG tablet Take 1 tablet (40 mg total) by mouth 2 (two) times daily. 60 tablet 0  . gabapentin (NEURONTIN) 800 MG tablet Take 800 mg by mouth 2 (two) times daily.    Marland Kitchen glimepiride (AMARYL) 2 MG tablet Take 1 tablet (2 mg total) by mouth daily with breakfast. (Patient taking differently: Take 2 mg  by mouth 2 (two) times daily. ) 30 tablet 2  . hydrochlorothiazide (HYDRODIURIL) 12.5 MG tablet Take 12.5 mg by mouth 2 (two) times daily.     . insulin aspart (NOVOLOG) 100 UNIT/ML injection Inject 0-15 Units into the skin 3 (three) times daily with meals. CBG < 70: implement hypoglycemia protocol CBG 70 - 120: 0 units CBG 121 - 150: 0 units CBG 151 - 200: 0 units CBG 201 - 250: 2 units CBG 251 - 300: 3 units CBG 301 - 350: 4 units CBG 351 - 400: 5 units CBG > 400: call MD and obtain STAT lab verification 10 mL 11  . loratadine (CLARITIN) 10 MG tablet Take 10 mg by mouth daily as needed for allergies.     Marland Kitchen lovastatin (MEVACOR) 20 MG tablet Take 20 mg by mouth at bedtime.    . magnesium oxide (MAG-OX) 400 (241.3 Mg) MG tablet Take 1 tablet (400 mg total) by mouth daily. 7 tablet 0  .  omeprazole (PRILOSEC) 40 MG capsule Take 40 mg by mouth at bedtime.     Marland Kitchen oxyCODONE-acetaminophen (PERCOCET) 5-325 MG tablet Take 1 tablet by mouth every 8 (eight) hours as needed for moderate pain or severe pain. 30 tablet 0  . potassium chloride SA (K-DUR,KLOR-CON) 20 MEQ tablet Take 2 tablets (40 mEq total) by mouth daily. 30 tablet 2  . triamcinolone (KENALOG) 0.025 % cream Apply 1 application topically every Monday, Wednesday, and Friday.    . white petrolatum (VASELINE) GEL Apply 1 application topically as needed for lip care.     No current facility-administered medications on file prior to visit.     Social History  Substance Use Topics  . Smoking status: Never Smoker  . Smokeless tobacco: Never Used  . Alcohol use No    Review of Systems  Constitutional: Negative for chills and fever.  Eyes: Negative for visual disturbance.  Respiratory: Negative for cough and shortness of breath.   Cardiovascular: Negative for chest pain, palpitations and leg swelling.  Gastrointestinal: Negative for nausea and vomiting.  Endocrine: Negative for polyuria.  Neurological: Positive for weakness.      Objective:    BP 128/68   Pulse 88   Temp 97.5 F (36.4 C) (Oral)   Ht 5' 2"  (1.575 m)   Wt 232 lb 12.8 oz (105.6 kg)   SpO2 96%   BMI 42.58 kg/m  BP Readings from Last 3 Encounters:  02/02/16 128/68  02/02/16 (!) 103/44  01/29/16 (!) 130/55   Wt Readings from Last 3 Encounters:  02/02/16 232 lb 12.8 oz (105.6 kg)  02/02/16 207 lb (93.9 kg)  01/29/16 243 lb (110.2 kg)    Physical Exam  Constitutional: She appears well-developed and well-nourished.  Eyes: Conjunctivae are normal.  Cardiovascular: Normal rate, regular rhythm, normal heart sounds and normal pulses.   No LE edema.  Pulmonary/Chest: Effort normal and breath sounds normal. She has no wheezes. She has no rhonchi. She has no rales.  Musculoskeletal:  BLE strength 4/5. Sitting in wheelchair.  Neurological: She is  alert.  Skin: Skin is warm and dry.  Psychiatric: She has a normal mood and affect. Her speech is normal and behavior is normal. Thought content normal.  Vitals reviewed.      Assessment & Plan:   Problem List Items Addressed This Visit      Cardiovascular and Mediastinum   CHF (congestive heart failure) ([place])    Appears stable today. SaO2 within normal limits. No LE swelling.  No adventitious lung sounds. Weight decreased from 12/1 243.   Continues to follow with heart failure clinic.        Endocrine   Type 2 diabetes mellitus ([place]) - Primary    Has not been taking SSI. Education provided. Follow up 4 days with PharmD, Reubin Milan for insulin management. Referral also placed to endocrine for consult.       Relevant Medications   insulin glargine (LANTUS) 100 UNIT/ML injection   Other Relevant Orders   Ambulatory referral to Endocrinology     Other   Recurrent falls    Reordered Home PT and fall prevention evaluation. Suspect falls are multifactorial including deconditioning and obesity.       Relevant Orders   Ambulatory referral to General Surgery   Ambulatory referral to [place]   DME Other see comment   Morbid obesity [facility])    Referral for bariatric consult.       Relevant Medications   insulin glargine (LANTUS) 100 UNIT/ML injection       I have discontinued Ms. Ferran's naproxen, diclofenac, and hydrOXYzine. I have also changed her insulin glargine. Additionally, I am having her maintain her hydrochlorothiazide, omeprazole, lovastatin, aspirin EC, gabapentin, loratadine, ferrous sulfate, clobetasol ointment, furosemide, potassium chloride SA, triamcinolone, acetaminophen, albuterol, insulin aspart, oxyCODONE-acetaminophen, magnesium oxide, glimepiride, white petrolatum, B-12, and Cholecalciferol.   Meds ordered this encounter  Medications  . insulin glargine (LANTUS) 100 UNIT/ML injection    Sig: Inject 0.1 mLs (10 Units total) into the skin at bedtime.     Dispense:  10 mL    Refill:  3    Order Specific Question:   Supervising Provider    Answer:   Crecencio Mc [[id number]]    Return precautions given.   Risks, benefits, and alternatives of the medications and treatment plan prescribed today were discussed, and patient expressed understanding.   Education regarding symptom management and diagnosis given to patient on AVS.  Continue to follow with Mable Paris, FNP for routine health maintenance.   [place] and I agreed with plan.   Mable Paris, FNP

## 2016-02-02 NOTE — Assessment & Plan Note (Addendum)
Appears stable today. SaO2 within normal limits. No LE swelling. No adventitious lung sounds. Weight decreased from 12/1 243.   Continues to follow with heart failure clinic.

## 2016-02-02 NOTE — Telephone Encounter (Signed)
Patient will receive refill during appointment. Medication requested is not on current medication list.

## 2016-02-02 NOTE — Progress Notes (Signed)
Pre visit review using our clinic review tool, if applicable. No additional management support is needed unless otherwise documented below in the visit note. 

## 2016-02-02 NOTE — Patient Instructions (Signed)
Continue weighing daily and call for an overnight weight gain of > 2 pounds or a weekly weight gain of >5 pounds. 

## 2016-02-02 NOTE — Assessment & Plan Note (Signed)
Has not been taking SSI. Education provided. Follow up 4 days with PharmD, Riley Churches for insulin management. Referral also placed to endocrine for consult.

## 2016-02-03 ENCOUNTER — Telehealth: Payer: Self-pay | Admitting: *Deleted

## 2016-02-03 ENCOUNTER — Encounter: Payer: Self-pay | Admitting: Family

## 2016-02-03 NOTE — Telephone Encounter (Signed)
Patient requested a call to discuss her proper dosage for her Sander Radon log and lantus  Pt contact [phone]

## 2016-02-03 NOTE — Telephone Encounter (Signed)
Spoke with pt about  novolog and lantus.   Avised f/u with derm about dapsone and atarax.   All questions answered

## 2016-02-03 NOTE — Telephone Encounter (Signed)
Pt's daughter called, she questioned if pt should be taking dapsone and hydroxyzine. Daughter stated that theses medications were prescribed by the dermatologist. Pt is currently taking the medications.  Pt contact [phone]

## 2016-02-03 NOTE — Telephone Encounter (Signed)
Patients daughter would like to discuss the medications with [place]. Please Advise.

## 2016-02-03 NOTE — ED Provider Notes (Signed)
Time Seen: Approximately 2047  I have reviewed the triage notes  Chief Complaint: Fall; Knee Pain; Urinary Incontinence; and Hip Pain   History of Present Illness: [name] is a 70 y.o. female who states that she had a non-syncopal fall today and describes some pain in the knee and hip area. She denies any loss of consciousness or significant head trauma has had a long history of difficulties with chronic arthritis. Etc. Patient fell transferring from the chair to the bed. Her only other physical complaint is some painful urination with a foul odor. She denies any fever, nausea, vomiting. She denies any new focal weakness.   Past Medical History:  Diagnosis Date  . Anemia   . Asthma   . CHF (congestive heart failure) ([facility])   . CVA (cerebral vascular accident) ([facility])   . Diabetes mellitus without complication ([facility])   . GERD (gastroesophageal reflux disease)   . Heart murmur   . History of hiatal hernia   . Hypertension   . Panic attacks   . Peripheral vascular disease ([facility])   . Restless leg syndrome   . Shortness of breath dyspnea     Patient Active Problem List   Diagnosis Date Noted  . Morbid obesity ([facility]) 02/02/2016  . HTN (hypertension) 01/06/2016  . Routine physical examination 12/30/2015  . Recurrent falls 12/13/2015  . Fatty liver 12/10/2015  . Fracture of multiple ribs 11/25/2015  . B12 deficiency anemia 11/03/2015  . GERD (gastroesophageal reflux disease) 10/17/2015  . Pure hypercholesterolemia 10/17/2015  . Type 2 diabetes mellitus ([facility]) 10/05/2015  . Depression 10/05/2015  . Rash and nonspecific skin eruption 10/05/2015  . CHF (congestive heart failure) ([facility]) 03/13/2015  . Carotid stenosis 07/24/2014    Past Surgical History:  Procedure Laterality Date  . ABDOMINAL HYSTERECTOMY    . BREAST BIOPSY Right yrs ago   benign  . ENDARTERECTOMY Right 07/24/2014   Procedure: ENDARTERECTOMY CAROTID;  Surgeon: Annice Needy, MD;  Location: [facility] ORS;  Service:  Vascular;  Laterality: Right;  . EYE SURGERY Left    cataract  . FRACTURE SURGERY Left    fractured ankle  . TONSILLECTOMY      Past Surgical History:  Procedure Laterality Date  . ABDOMINAL HYSTERECTOMY    . BREAST BIOPSY Right yrs ago   benign  . ENDARTERECTOMY Right 07/24/2014   Procedure: ENDARTERECTOMY CAROTID;  Surgeon: Annice Needy, MD;  Location: [facility] ORS;  Service: Vascular;  Laterality: Right;  . EYE SURGERY Left    cataract  . FRACTURE SURGERY Left    fractured ankle  . TONSILLECTOMY      Current Outpatient Rx  . Order #: [id number] Class: OTC  . Order #: [id number] Class: Normal  . Order #: [id number] Class: Historical Med  . Order #: [id number] Class: Historical Med  . Order #: [id number] Class: Historical Med  . Order #: [id number] Class: Historical Med  . Order #: [id number] Class: Normal  . Order #: [id number] Class: Normal  . Order #: [id number] Class: Historical Med  . Order #: [id number] Class: Normal  . Order #: [id number] Class: Historical Med  . Order #: [id number] Class: Normal  . Order #: [id number] Class: Normal  . Order #: [id number] Class: Historical Med  . Order #: [id number] Class: Historical Med  . Order #: [id number] Class: Normal  . Order #: [id number] Class: Historical Med  . Order #: [id number] Class: Print  . Order #: [id number] Class: Normal  . Order #: [id number] Class: Historical Med  . Order #: [id number] Class: Historical Med    Allergies:  Codeine; Liraglutide; and  Wheat bran  Family History: Family History  Problem Relation Age of Onset  . Pancreatic cancer Mother   . CAD Father   . Hypertension Father   . Pancreatic cancer Father   . CAD Brother   . Breast cancer Maternal Aunt     pt states several maternal aunts    Social History: Social History  Substance Use Topics  . Smoking status: Never Smoker  . Smokeless tobacco: Never Used  . Alcohol use No     Review of Systems:   10 point review of systems was performed and was otherwise  negative:  Constitutional: No fever Eyes: No visual disturbances ENT: No sore throat, ear pain Cardiac: No chest pain Respiratory: No shortness of breath, wheezing, or stridor Abdomen: No abdominal pain, no vomiting, No diarrhea Endocrine: No weight loss, No night sweats Extremities: No peripheral edema, cyanosis Skin: No rashes, easy bruising Neurologic: No focal weakness, trouble with speech or swollowing Urologic: No dysuria, Hematuria, or urinary frequency   Physical Exam:  ED Triage Vitals  Enc Vitals Group     BP 01/29/16 1748 (!) 130/55     Pulse Rate 01/29/16 1748 95     Resp 01/29/16 1748 18     Temp 01/29/16 1748 97.5 F (36.4 C)     Temp Source 01/29/16 1748 Oral     SpO2 01/29/16 1748 92 %     Weight 01/29/16 1749 243 lb (110.2 kg)     Height 01/29/16 1749 5\' 2"  (1.575 m)     Head Circumference --      Peak Flow --      Pain Score 01/29/16 1749 2     Pain Loc --      Pain Edu? --      Excl. in GC? --     General: Awake , Alert , and Oriented times 3; GCS 15 Head: Normal cephalic , atraumatic Eyes: Pupils equal , round, reactive to light Nose/Throat: No nasal drainage, patent upper airway without erythema or exudate.  Neck: Supple, Full range of motion, No anterior adenopathy or palpable thyroid masses Lungs: Clear to ascultation without wheezes , rhonchi, or rales Heart: Regular rate, regular rhythm without murmurs , gallops , or rubs Abdomen:Obese, Soft, non tender without rebound, guarding , or rigidity; bowel sounds positive and symmetric in all 4 quadrants. No organomegaly .        Extremities: 2. Movements of the hip and also the left knee. No crepitus or step-off noted in the extremity is neurovascularly intact. Patient was able to lay later bear weight with ambulation and transition which is her normal activity at home. Negative, posterior drawer sign. Negative Lachman test. Neurologic: normal ambulation, Motor symmetric without deficits, sensory  intact Skin: warm, dry, no rashes   Labs:   All laboratory work was reviewed including any pertinent negatives or positives listed below:  Labs Reviewed  URINALYSIS COMPLETEWITH MICROSCOPIC ([facility] ONLY) - Abnormal; Notable for the following:       Result Value   Color, Urine YELLOW (*)    APPearance CLEAR (*)    Glucose, UA >500 (*)    Bacteria, UA RARE (*)    Squamous Epithelial / LPF 0-5 (*)    All other components within normal limits  Urine shows no signs of infection   Radiology: * "Dg Knee Complete 4 Views Left  Result Date: 01/29/2016 CLINICAL DATA:  Fall several times in past 3 days. Left knee injury and pain. Initial encounter. EXAM:  LEFT KNEE - COMPLETE 4+ VIEW COMPARISON:  None. FINDINGS: No evidence of fracture, dislocation, or joint effusion. Mild degenerative spurring of patella seen. Degenerative sclerosis is seen involving the lateral compartment. No significant joint space narrowing. No other bone lesions identified. Generalized osteopenia noted. Mild peripheral vascular calcification also seen. IMPRESSION: No acute findings. Mild osteoarthritis. Electronically Signed   By: Myles Rosenthal M.D.   On: 01/29/2016 18:42   Dg Hip Unilat With Pelvis 2-3 Views Left  Result Date: 01/29/2016 CLINICAL DATA:  70 year old female with history of trauma from a fall 3 times in the past 3 days. Left-sided hip pain radiating down to the left knee. Pain while bearing weight. EXAM: DG HIP (WITH OR WITHOUT PELVIS) 2-3V LEFT COMPARISON:  12/10/2015. FINDINGS: AP view of the bony pelvis and AP and lateral views of the left hip demonstrate no acute displaced fracture, subluxation or dislocation. There is joint space narrowing, subchondral sclerosis, subchondral cyst formation and osteophyte formation in the hip joints bilaterally, compatible with underlying osteoarthritis. IMPRESSION: 1. No acute radiographic abnormality of the bony pelvis or the left hip. 2. Moderate bilateral hip joint  osteoarthritis. Electronically Signed   By: Trudie Reed M.D.   On: 01/29/2016 18:39  "  I personally reviewed the radiologic studies    ED Course:  Patient is able to weight-bear here in emergency department and I felt that we did not need to do any further testing at this time. She was advised follow-up with her primary doctor and/or orthopedics as needed. She has a history of recurrent falls usually secondary to arthritis etc. Clinical Course      Assessment: Left hip and knee contusion      Plan:  Outpatient Patient was advised to return immediately if condition worsens. Patient was advised to follow up with their primary care physician or other specialized physicians involved in their outpatient care. The patient and/or family member/power of attorney had laboratory results reviewed at the bedside. All questions and concerns were addressed and appropriate discharge instructions were distributed by the nursing staff. Jennye Moccasin, MD 02/03/16 [phone]

## 2016-02-03 NOTE — Telephone Encounter (Signed)
Please see other phone note as well.

## 2016-02-04 ENCOUNTER — Telehealth: Payer: Self-pay | Admitting: Family

## 2016-02-04 DIAGNOSIS — Z794 Long term (current) use of insulin: Principal | ICD-10-CM

## 2016-02-04 DIAGNOSIS — E119 Type 2 diabetes mellitus without complications: Secondary | ICD-10-CM

## 2016-02-04 MED ORDER — INSULIN GLARGINE 100 UNIT/ML ~~LOC~~ SOLN
10.0000 [IU] | Freq: Every day | SUBCUTANEOUS | 3 refills | Status: DC
Start: 2016-02-04 — End: 2016-02-08

## 2016-02-04 NOTE — Telephone Encounter (Signed)
Pt called back to get a status on a new insulin that is supposed to be called in.  She would like it to go to [facility] [address] [place], [place] - [address]. Thank you!  Call pt @ [phone]

## 2016-02-04 NOTE — Telephone Encounter (Signed)
Patient has been notified

## 2016-02-04 NOTE — Telephone Encounter (Signed)
Medication has been filled 

## 2016-02-08 ENCOUNTER — Ambulatory Visit (INDEPENDENT_AMBULATORY_CARE_PROVIDER_SITE_OTHER): Payer: Medicare Other | Admitting: Pharmacist

## 2016-02-08 ENCOUNTER — Encounter: Payer: Self-pay | Admitting: Pharmacist

## 2016-02-08 ENCOUNTER — Ambulatory Visit: Payer: Medicare Other | Admitting: Family

## 2016-02-08 DIAGNOSIS — E119 Type 2 diabetes mellitus without complications: Secondary | ICD-10-CM | POA: Diagnosis not present

## 2016-02-08 DIAGNOSIS — Z794 Long term (current) use of insulin: Secondary | ICD-10-CM | POA: Diagnosis not present

## 2016-02-08 MED ORDER — INSULIN GLARGINE 100 UNIT/ML ~~LOC~~ SOLN: 15.0000 [IU] | Freq: Every day | SUBCUTANEOUS | 3 refills | Status: DC

## 2016-02-08 MED ORDER — INSULIN ASPART 100 UNIT/ML ~~LOC~~ SOLN: 6.0000 [IU] | Freq: Three times a day (TID) | SUBCUTANEOUS | 11 refills | Status: DC

## 2016-02-08 NOTE — Patient Instructions (Addendum)
Thank you for coming in today  Stop Glimepiride Increase Lantus to 15 units daily Start taking Novolog 6 units three times daily before meals  Please continue to check your blood sugars 3 times daily  Hazle Nordmann, PharmD [facility] PGY2 Pharmacy Resident [phone]  Will call you tomorrow to go over your medications you are unsure if you are taking Will call on Friday 02/12/16 at ~5 pm to go over blood sugar readings and change in insulin dosing.

## 2016-02-08 NOTE — Progress Notes (Addendum)
S:    Patient arrives today in a wheelchair assisted by 2 caregivers.  Presents for diabetes evaluation, education, and management at the request of Rennie Plowman, FNP. Patient was referred on 02/02/2016.  Patient was last seen by Primary Care Provider on 02/02/2016.  Today patient states she was started on Novolog last week but states this has not really improved her blood glucose.  Caregiver and patient are confused about some of the medications she is taking especially the prednisone which she is prescribed by dermatology.   Patient and caregiver reports adherence with medications.  Current diabetes medications include: Lantus 10 units QHS + Novolog sliding scale (mostly using 3-5 units with meals)  Patient denies hypoglycemic events on current regimen but patient reports CBG in 30-40s twice but this has been over a month ago which may have been to insulin confusion and using sliding scale dosing with Lantus instead of Novolog.   Patient reported dietary habits: Eats 3 meals/day Breakfast:2 boiled eggs with toast or cereal + half banana or english muffin Lunch:half sandwich  Dinner:meat with 2 vegetables (greens, sweet potatoes) or with macaroni and cheese or spaghetti and salad Snacks:Caregivers try to prevent her from eating sweets Drinks:Diet Soda, Drinks lots of water + crystal light, unsweet tea.   Patient complains of not having an appetite and caregivers state she is not eating as much as she used to.   Patient reported exercise habits: Wheelchair bound with limited mobility.    Patient reports nocturia at 2-3 times per night.   Patient denies neuropathy. Patient reports visual changes with blurred vision.  Patient reports self foot exams by caregivers and home health nurse.  Denies changes.    Caregivers state patient is adherent to low sodium diet but were unaware of 2L fluid limitation. Patient is nnot elevating legs at home during the day.  She denies SOB.  Slight low  exertremity edema is present on exam today.    O:  Lab Results  Component Value Date   HGBA1C 8.1 (H) 12/30/2015   Home fasting CBG: range 220 mg/dL to "Hi"  2 hour post-prandial/random CBG: "Hi" on CBG meter  A/P: Diabetes longstanding diagnosed currently uncontrolled. Patient denies hypoglycemic events currently but has history of episodes likely due to medication confusion.  Patient and caregivers are able to verbalize appropriate hypoglycemia management plan. Patient reports adherence with medication. Control is suboptimal due to steroid induced hyperglycemia and need for further insulin titration.  Counseled patient on low carbohydrate diet and low sodium diet.   Following discussion with Sena Hitch, FNP the following medication changes were made: Increased dose of basal insulin Lantus (insulin glargine) to 15 units QHS. Increased dose of rapid insulin Novolog (insulin aspart) to 6 units TID before meals.  Will stop Glimepiride and Novolog sliding scale due to risk of hypoglycemia.  Counseled on signs/symptoms/treatment of hypoglycemia using rule of 15s. Instructed patient and caregiver to continue to check CBGs 3 times daily (fasting and before lunch/dinner). Next A1C anticipated 2 months.    Medication Adherence: Patient and caregiver confused about which medications patient is currently taking. Will plan followup tomorrow via telephone for medication reconciliation.   Written patient instructions provided.  Total time in face to face counseling 45 minutes.   Follow up in Pharmacist Clinic Visit via telephone in 1-2 days for medication reconciliation and 2-3 days for CBG/insulin titration and followup in clinic with Sena Hitch, FNP in 2-3 weeks.   Medication changes were discussed with and  approved by Sena Hitch, FNP prior to initiation.   Spoke with patient as well; we jointly agreed on above plan and changes above per Kelsi. She will f/u with me in 3 months.  Claris Che FNP

## 2016-02-08 NOTE — Assessment & Plan Note (Signed)
  Diabetes longstanding diagnosed currently uncontrolled. Patient denies hypoglycemic events currently but has history of episodes likely due to medication confusion.  Patient and caregivers are able to verbalize appropriate hypoglycemia management plan. Patient reports adherence with medication. Control is suboptimal due to steroid induced hyperglycemia and need for further insulin titration.  Counseled patient on low carbohydrate diet and low sodium diet.   Following discussion with Sena Hitch, FNP the following medication changes were made: Increased dose of basal insulin Lantus (insulin glargine) to 15 units QHS. Increased dose of rapid insulin Novolog (insulin aspart) to 6 units TID before meals.  Will stop Glimepiride and Novolog sliding scale due to risk of hypoglycemia.  Counseled on signs/symptoms/treatment of hypoglycemia using rule of 15s. Instructed patient and caregiver to continue to check CBGs 3 times daily (fasting and before lunch/dinner). Next A1C anticipated 2 months.

## 2016-02-14 ENCOUNTER — Other Ambulatory Visit: Payer: Self-pay | Admitting: Family

## 2016-02-14 DIAGNOSIS — E876 Hypokalemia: Secondary | ICD-10-CM

## 2016-02-15 ENCOUNTER — Telehealth: Payer: Self-pay

## 2016-02-15 DIAGNOSIS — Z5189 Encounter for other specified aftercare: Secondary | ICD-10-CM

## 2016-02-15 NOTE — Telephone Encounter (Signed)
Teofilo Pod advised wound care orders per DME comments per Rennie Plowman.FNP.

## 2016-02-15 NOTE — Telephone Encounter (Signed)
[name] [phone] calls for wound care orders. Requesting wound care orders for wound care for abdomen to place duoderm on abdomen  .  Also need orders for right buttock to place silver gel and foam dressing.  Please advise.

## 2016-02-15 NOTE — Telephone Encounter (Signed)
Call back betty  See orders under DME comment- I put directions/orders there.

## 2016-02-18 ENCOUNTER — Other Ambulatory Visit: Payer: Self-pay | Admitting: Family

## 2016-02-18 DIAGNOSIS — E876 Hypokalemia: Secondary | ICD-10-CM

## 2016-02-18 DIAGNOSIS — D649 Anemia, unspecified: Secondary | ICD-10-CM

## 2016-02-25 ENCOUNTER — Encounter: Payer: Self-pay | Admitting: Family

## 2016-02-25 ENCOUNTER — Ambulatory Visit (INDEPENDENT_AMBULATORY_CARE_PROVIDER_SITE_OTHER): Payer: Medicare Other | Admitting: Family

## 2016-02-25 ENCOUNTER — Other Ambulatory Visit: Payer: Self-pay

## 2016-02-25 VITALS — BP 126/66 | HR 82 | Temp 97.4°F | Ht 62.0 in | Wt 233.0 lb

## 2016-02-25 DIAGNOSIS — E119 Type 2 diabetes mellitus without complications: Secondary | ICD-10-CM

## 2016-02-25 DIAGNOSIS — Z794 Long term (current) use of insulin: Secondary | ICD-10-CM | POA: Diagnosis not present

## 2016-02-25 DIAGNOSIS — I5032 Chronic diastolic (congestive) heart failure: Secondary | ICD-10-CM | POA: Diagnosis not present

## 2016-02-25 DIAGNOSIS — E876 Hypokalemia: Secondary | ICD-10-CM

## 2016-02-25 DIAGNOSIS — I10 Essential (primary) hypertension: Secondary | ICD-10-CM

## 2016-02-25 MED ORDER — POTASSIUM CHLORIDE CRYS ER 20 MEQ PO TBCR: 40.0000 meq | EXTENDED_RELEASE_TABLET | Freq: Every day | ORAL | 2 refills | Status: DC

## 2016-02-25 NOTE — Telephone Encounter (Signed)
[name] from [facility] is wanting a verbal order to discharge pt after he has completed the 4 wks of care that was requested if she has met potential or if pt declines further treatment. Please cb [phone]

## 2016-02-25 NOTE — Progress Notes (Signed)
Subjective:    Patient ID: [name], female    DOB: Apr 03, 1945, 70 y.o.   MRN: [id number]  CC: [name] is a 70 y.o. female who presents today for follow up.   HPI:  DM- Had a low fasting BS 38 yesterday at 11am. She had just woken up and only had eaten jello. On the same day at lunch time , pre-prandial blood sugar 504.  Compliant with 15 units lantus at night. 6 SSI meals.   PT coming twice per week. Walking at home, using walker.   CHF- No SOB, wheezing. Weight Stable.    Saw Dr Tiffany Kocher regarding anemia; she will need cardiac clearance prior to colonoscopy      HISTORY:  Past Medical History:  Diagnosis Date  . Anemia   . Asthma   . CHF (congestive heart failure) ([place])   . CVA (cerebral vascular accident) ([place])   . Diabetes mellitus without complication ([place])   . GERD (gastroesophageal reflux disease)   . Heart murmur   . History of hiatal hernia   . Hypertension   . Panic attacks   . Peripheral vascular disease ([place])   . Restless leg syndrome   . Shortness of breath dyspnea    Past Surgical History:  Procedure Laterality Date  . ABDOMINAL HYSTERECTOMY    . BREAST BIOPSY Right yrs ago   benign  . ENDARTERECTOMY Right 07/24/2014   Procedure: ENDARTERECTOMY CAROTID;  Surgeon: Algernon Huxley, MD;  Location: [facility] ORS;  Service: Vascular;  Laterality: Right;  . EYE SURGERY Left    cataract  . FRACTURE SURGERY Left    fractured ankle  . TONSILLECTOMY     Family History  Problem Relation Age of Onset  . Pancreatic cancer Mother   . CAD Father   . Hypertension Father   . Pancreatic cancer Father   . CAD Brother   . Breast cancer Maternal Aunt     pt states several maternal aunts    Allergies: Codeine; Liraglutide; and Wheat bran Current Outpatient Prescriptions on File Prior to Visit  Medication Sig Dispense Refill  . acetaminophen (TYLENOL) 325 MG tablet Take 2 tablets (650 mg total) by mouth every 6 (six) hours as needed for mild pain (or Fever  >/= 101).    Marland Kitchen albuterol (PROVENTIL) (2.5 MG/3ML) 0.083% nebulizer solution Take 3 mLs (2.5 mg total) by nebulization every 4 (four) hours as needed for wheezing. 75 mL 12  . aspirin EC 81 MG tablet Take 81 mg by mouth daily.    . Cholecalciferol (D3 SUPER STRENGTH) 2000 units CAPS Take 2,000 Units by mouth daily.    . clobetasol ointment (TEMOVATE) 0.05 % Apply to rash twice daily    . Cyanocobalamin (B-12) 1000 MCG/ML KIT Inject 1,000 mcg as directed every 30 (thirty) days.    . dapsone 25 MG tablet Take 50 mg by mouth daily.    . ferrous sulfate 325 (65 FE) MG tablet Take 1 tablet (325 mg total) by mouth daily with breakfast. 30 tablet 3  . furosemide (LASIX) 40 MG tablet Take 1 tablet (40 mg total) by mouth 2 (two) times daily. (Patient taking differently: Take 40 mg by mouth daily. ) 60 tablet 0  . gabapentin (NEURONTIN) 800 MG tablet Take 800 mg by mouth 2 (two) times daily.    . hydrochlorothiazide (HYDRODIURIL) 12.5 MG tablet Take 12.5 mg by mouth 2 (two) times daily.     . insulin aspart (NOVOLOG) 100 UNIT/ML injection  Inject 6 Units into the skin 3 (three) times daily with meals. 10 mL 11  . insulin glargine (LANTUS) 100 UNIT/ML injection Inject 0.15 mLs (15 Units total) into the skin at bedtime. 10 mL 3  . loratadine (CLARITIN) 10 MG tablet Take 10 mg by mouth daily as needed for allergies.     Marland Kitchen lovastatin (MEVACOR) 20 MG tablet Take 20 mg by mouth at bedtime.    . magnesium oxide (MAG-OX) 400 (241.3 Mg) MG tablet Take 1 tablet (400 mg total) by mouth daily. 7 tablet 0  . omeprazole (PRILOSEC) 40 MG capsule Take 40 mg by mouth at bedtime.     Marland Kitchen oxyCODONE-acetaminophen (PERCOCET) 5-325 MG tablet Take 1 tablet by mouth every 8 (eight) hours as needed for moderate pain or severe pain. 30 tablet 0  . predniSONE (DELTASONE) 20 MG tablet Take 20 mg by mouth daily.    Marland Kitchen triamcinolone (KENALOG) 0.025 % cream Apply 1 application topically every Monday, Wednesday, and Friday.    . white  petrolatum (VASELINE) GEL Apply 1 application topically as needed for lip care.     No current facility-administered medications on file prior to visit.     Social History  Substance Use Topics  . Smoking status: Never Smoker  . Smokeless tobacco: Never Used  . Alcohol use No    Review of Systems  Constitutional: Negative for chills and fever.  Respiratory: Negative for cough and shortness of breath.   Cardiovascular: Negative for chest pain, palpitations and leg swelling.  Gastrointestinal: Negative for nausea and vomiting.  Skin: Positive for rash (chronic, abdomen).  Neurological: Negative for dizziness.      Objective:    BP 126/66   Pulse 82   Temp 97.4 F (36.3 C) (Oral)   Ht 5' 2"  (1.575 m)   Wt 233 lb (105.7 kg)   SpO2 94%   BMI 42.62 kg/m  BP Readings from Last 3 Encounters:  02/25/16 126/66  02/02/16 128/68  02/02/16 (!) 103/44   Wt Readings from Last 3 Encounters:  02/25/16 233 lb (105.7 kg)  02/08/16 231 lb (104.8 kg)  02/02/16 232 lb 12.8 oz (105.6 kg)    Physical Exam  Constitutional: She appears well-developed and well-nourished.  Eyes: Conjunctivae are normal.  Cardiovascular: Normal rate, regular rhythm, normal heart sounds and normal pulses.   Trace le swelling  Pulmonary/Chest: Effort normal and breath sounds normal. She has no wheezes. She has no rhonchi. She has no rales.  Neurological: She is alert.  Skin: Skin is warm and dry.  Psychiatric: She has a normal mood and affect. Her speech is normal and behavior is normal. Thought content normal.  Vitals reviewed.      Assessment & Plan:   Problem List Items Addressed This Visit      Cardiovascular and Mediastinum   HTN (hypertension) (Chronic)    Controlled. Continue current regimen.       CHF (congestive heart failure) (HCC)    Weight stable. No adventitious lung sounds. Continues to follow with heart failure clinic.         Endocrine   Type 2 diabetes mellitus ([place]) - Primary     Uncontrolled. Concern with patient's diet and low instance this past week. Reiterated to patient how serious hypoglycemia is and how to treat as well as prevent. However with more information from caregivers and patient, she had only had jello prior. We had a long discussion about combining a carbohydrate with a protein. I also placed  a referral to diabetic nutritionist. We decided to increase her SSI to 9 units with meals. Patient will keep log and follow up in 1 week.        Relevant Orders   Ambulatory referral to diabetic education       I am having Ms. Izzo maintain her hydrochlorothiazide, omeprazole, lovastatin, aspirin EC, gabapentin, loratadine, ferrous sulfate, clobetasol ointment, furosemide, triamcinolone, acetaminophen, albuterol, oxyCODONE-acetaminophen, magnesium oxide, white petrolatum, B-12, Cholecalciferol, dapsone, predniSONE, insulin glargine, insulin aspart, and potassium chloride SA.   No orders of the defined types were placed in this encounter.   Return precautions given.   Risks, benefits, and alternatives of the medications and treatment plan prescribed today were discussed, and patient expressed understanding.   Education regarding symptom management and diagnosis given to patient on AVS.  Continue to follow with Mable Paris, FNP for routine health maintenance.   [place] and I agreed with plan.   Mable Paris, FNP

## 2016-02-25 NOTE — Patient Instructions (Signed)
NO JELLO.  Have to have meal shake if you are on insulin. Examples are Premier or Atkins.   Increase sliding scale insulin to 9 with breakfast, 9 with lunch, and 9 with dinner.   Stay on 15 units of lantus at night.    Insulin Short Acting ( Novolog)  Check blood sugar prior to each meal and at bedtime; please keep a log of this and ensure that if you have any symptoms of low blood sugar ( sweating, shakiness, lightheaded, dizzy) that you notify me. If you have a low, please drink a glass of orange juice and recheck blood sugar every 5 minutes until you don't feel symptomatic AND blood sugar is above 80.   When you check you blood sugar before a meal and it is greater than 200, give yourself 9 units with your meal. This is a good, straightforward starting point from which we can tweak.    Follow up in one week with blood sugars,

## 2016-02-25 NOTE — Telephone Encounter (Signed)
medication has been refilled. 

## 2016-02-25 NOTE — Progress Notes (Signed)
Pre visit review using our clinic review tool, if applicable. No additional management support is needed unless otherwise documented below in the visit note. 

## 2016-02-27 ENCOUNTER — Telehealth: Payer: Self-pay | Admitting: Family

## 2016-02-27 ENCOUNTER — Encounter: Payer: Self-pay | Admitting: Family

## 2016-02-27 NOTE — Assessment & Plan Note (Signed)
Weight stable. No adventitious lung sounds. Continues to follow with heart failure clinic.

## 2016-02-27 NOTE — Assessment & Plan Note (Signed)
Uncontrolled. Concern with patient's diet and low instance this past week. Reiterated to patient how serious hypoglycemia is and how to treat as well as prevent. However with more information from caregivers and patient, she had only had jello prior. We had a long discussion about combining a carbohydrate with a protein. I also placed a referral to diabetic nutritionist. We decided to increase her SSI to 9 units with meals. Patient will keep log and follow up in 1 week.

## 2016-02-27 NOTE — Assessment & Plan Note (Signed)
Controlled. Continue current regimen. 

## 2016-02-27 NOTE — Telephone Encounter (Signed)
Call pt-  Would you clarify if she is still on prednisone 20mg  right now??

## 2016-03-01 ENCOUNTER — Ambulatory Visit: Payer: Medicare Other | Admitting: Family

## 2016-03-01 ENCOUNTER — Ambulatory Visit
Admission: RE | Admit: 2016-03-01 | Discharge: 2016-03-01 | Disposition: A | Discharge status: Home / Self Care | Payer: Medicare Other | Source: Ambulatory Visit | Attending: Family | Admitting: Family

## 2016-03-01 DIAGNOSIS — M8589 Other specified disorders of bone density and structure, multiple sites: Secondary | ICD-10-CM | POA: Diagnosis not present

## 2016-03-01 DIAGNOSIS — Z0000 Encounter for general adult medical examination without abnormal findings: Secondary | ICD-10-CM | POA: Insufficient documentation

## 2016-03-01 NOTE — Telephone Encounter (Signed)
Noted Will do BMP today at OV. Hold K refill until then

## 2016-03-01 NOTE — Telephone Encounter (Signed)
[name]-Would you mind calling? See below

## 2016-03-01 NOTE — Telephone Encounter (Signed)
Patient states she is not on prednisone, her blood glucose readings have decreased and today it was 189, she has not had any low readings since her last office visit.Patient has started drinking the protein shakes.

## 2016-03-01 NOTE — Telephone Encounter (Signed)
Left message to return call 

## 2016-03-01 NOTE — Progress Notes (Deleted)
Subjective:    Patient ID: [name], female    DOB: 03-Jun-1945, 71 y.o.   MRN: [id number]  CC: [name] is a 71 y.o. female who presents today for an acute visit.    HPI: CC: dysuria  No recent UTIs. No concern of STDs    HISTORY:  Past Medical History:  Diagnosis Date  . Anemia   . Asthma   . CHF (congestive heart failure) ([place])   . CVA (cerebral vascular accident) ([place])   . Diabetes mellitus without complication ([place])   . GERD (gastroesophageal reflux disease)   . Heart murmur   . History of hiatal hernia   . Hypertension   . Panic attacks   . Peripheral vascular disease ([place])   . Restless leg syndrome   . Shortness of breath dyspnea    Past Surgical History:  Procedure Laterality Date  . ABDOMINAL HYSTERECTOMY    . BREAST BIOPSY Right yrs ago   benign  . ENDARTERECTOMY Right 07/24/2014   Procedure: ENDARTERECTOMY CAROTID;  Surgeon: Algernon Huxley, MD;  Location: [facility] ORS;  Service: Vascular;  Laterality: Right;  . EYE SURGERY Left    cataract  . FRACTURE SURGERY Left    fractured ankle  . TONSILLECTOMY     Family History  Problem Relation Age of Onset  . Pancreatic cancer Mother   . CAD Father   . Hypertension Father   . Pancreatic cancer Father   . CAD Brother   . Breast cancer Maternal Aunt     pt states several maternal aunts    Allergies: Codeine; Liraglutide; and Wheat bran Current Outpatient Prescriptions on File Prior to Visit  Medication Sig Dispense Refill  . acetaminophen (TYLENOL) 325 MG tablet Take 2 tablets (650 mg total) by mouth every 6 (six) hours as needed for mild pain (or Fever >/= 101).    Marland Kitchen albuterol (PROVENTIL) (2.5 MG/3ML) 0.083% nebulizer solution Take 3 mLs (2.5 mg total) by nebulization every 4 (four) hours as needed for wheezing. 75 mL 12  . aspirin EC 81 MG tablet Take 81 mg by mouth daily.    . Cholecalciferol (D3 SUPER STRENGTH) 2000 units CAPS Take 2,000 Units by mouth daily.    . clobetasol ointment (TEMOVATE)  0.05 % Apply to rash twice daily    . Cyanocobalamin (B-12) 1000 MCG/ML KIT Inject 1,000 mcg as directed every 30 (thirty) days.    . dapsone 25 MG tablet Take 50 mg by mouth daily.    . ferrous sulfate 325 (65 FE) MG tablet Take 1 tablet (325 mg total) by mouth daily with breakfast. 30 tablet 3  . furosemide (LASIX) 40 MG tablet Take 1 tablet (40 mg total) by mouth 2 (two) times daily. (Patient taking differently: Take 40 mg by mouth daily. ) 60 tablet 0  . gabapentin (NEURONTIN) 800 MG tablet Take 800 mg by mouth 2 (two) times daily.    . hydrochlorothiazide (HYDRODIURIL) 12.5 MG tablet Take 12.5 mg by mouth 2 (two) times daily.     . insulin aspart (NOVOLOG) 100 UNIT/ML injection Inject 6 Units into the skin 3 (three) times daily with meals. 10 mL 11  . insulin glargine (LANTUS) 100 UNIT/ML injection Inject 0.15 mLs (15 Units total) into the skin at bedtime. 10 mL 3  . loratadine (CLARITIN) 10 MG tablet Take 10 mg by mouth daily as needed for allergies.     Marland Kitchen lovastatin (MEVACOR) 20 MG tablet Take 20 mg by mouth at  bedtime.    . magnesium oxide (MAG-OX) 400 (241.3 Mg) MG tablet Take 1 tablet (400 mg total) by mouth daily. 7 tablet 0  . omeprazole (PRILOSEC) 40 MG capsule Take 40 mg by mouth at bedtime.     Marland Kitchen oxyCODONE-acetaminophen (PERCOCET) 5-325 MG tablet Take 1 tablet by mouth every 8 (eight) hours as needed for moderate pain or severe pain. 30 tablet 0  . potassium chloride SA (K-DUR,KLOR-CON) 20 MEQ tablet Take 2 tablets (40 mEq total) by mouth daily. 30 tablet 2  . predniSONE (DELTASONE) 20 MG tablet Take 20 mg by mouth daily.    Marland Kitchen triamcinolone (KENALOG) 0.025 % cream Apply 1 application topically every Monday, Wednesday, and Friday.    . white petrolatum (VASELINE) GEL Apply 1 application topically as needed for lip care.     No current facility-administered medications on file prior to visit.     Social History  Substance Use Topics  . Smoking status: Never Smoker  . Smokeless  tobacco: Never Used  . Alcohol use No    Review of Systems    Objective:    There were no vitals taken for this visit.   Physical Exam     Assessment & Plan:      I am having Ms. Wrightson maintain her hydrochlorothiazide, omeprazole, lovastatin, aspirin EC, gabapentin, loratadine, ferrous sulfate, clobetasol ointment, furosemide, triamcinolone, acetaminophen, albuterol, oxyCODONE-acetaminophen, magnesium oxide, white petrolatum, B-12, Cholecalciferol, dapsone, predniSONE, insulin glargine, insulin aspart, and potassium chloride SA.   No orders of the defined types were placed in this encounter.   Return precautions given.   Risks, benefits, and alternatives of the medications and treatment plan prescribed today were discussed, and patient expressed understanding.   Education regarding symptom management and diagnosis given to patient on AVS.  Continue to follow with Mable Paris, FNP for routine health maintenance.   [place] and I agreed with plan.   Mable Paris, FNP

## 2016-03-02 ENCOUNTER — Telehealth: Payer: Self-pay | Admitting: Family

## 2016-03-02 NOTE — Telephone Encounter (Signed)
[name] (OT) from [facility] called and stated that pt is having diarrhea and feeling weaker than yesterday, and barely eating. They are looking for instructions as far as what to do for her diarrhea. Please advise, thank you!  Hr 88 temp 97.5 bp sitting 100/70  Call [phone]

## 2016-03-02 NOTE — Telephone Encounter (Signed)
Please call and give verbal order for below.  Thanks.

## 2016-03-03 ENCOUNTER — Ambulatory Visit: Payer: Medicare Other | Admitting: Family

## 2016-03-03 ENCOUNTER — Encounter: Payer: Self-pay | Admitting: Emergency Medicine

## 2016-03-03 ENCOUNTER — Emergency Department
Admission: EM | Admit: 2016-03-03 | Discharge: 2016-03-03 | Disposition: A | Discharge status: Home / Self Care | Payer: Medicare Other | Attending: Emergency Medicine | Admitting: Emergency Medicine

## 2016-03-03 ENCOUNTER — Telehealth: Payer: Self-pay | Admitting: *Deleted

## 2016-03-03 DIAGNOSIS — R21 Rash and other nonspecific skin eruption: Secondary | ICD-10-CM | POA: Insufficient documentation

## 2016-03-03 DIAGNOSIS — I509 Heart failure, unspecified: Secondary | ICD-10-CM | POA: Insufficient documentation

## 2016-03-03 DIAGNOSIS — J45909 Unspecified asthma, uncomplicated: Secondary | ICD-10-CM | POA: Insufficient documentation

## 2016-03-03 DIAGNOSIS — Z7982 Long term (current) use of aspirin: Secondary | ICD-10-CM | POA: Insufficient documentation

## 2016-03-03 DIAGNOSIS — Z794 Long term (current) use of insulin: Secondary | ICD-10-CM | POA: Diagnosis not present

## 2016-03-03 DIAGNOSIS — Z79899 Other long term (current) drug therapy: Secondary | ICD-10-CM | POA: Diagnosis not present

## 2016-03-03 DIAGNOSIS — E119 Type 2 diabetes mellitus without complications: Secondary | ICD-10-CM | POA: Insufficient documentation

## 2016-03-03 DIAGNOSIS — R197 Diarrhea, unspecified: Secondary | ICD-10-CM | POA: Diagnosis present

## 2016-03-03 DIAGNOSIS — I110 Hypertensive heart disease with heart failure: Secondary | ICD-10-CM | POA: Insufficient documentation

## 2016-03-03 DIAGNOSIS — E876 Hypokalemia: Secondary | ICD-10-CM | POA: Insufficient documentation

## 2016-03-03 LAB — URINALYSIS, COMPLETE (UACMP) WITH MICROSCOPIC
Bacteria, UA: NONE SEEN
Bilirubin Urine: NEGATIVE
Glucose, UA: >=500 mg/dL — AB
KETONES UR: NEGATIVE mg/dL
Nitrite: NEGATIVE
PH: 8 (ref 5.0–8.0)
Protein, ur: NEGATIVE mg/dL
SPECIFIC GRAVITY, URINE: 1.006 (ref 1.005–1.030)

## 2016-03-03 LAB — COMPREHENSIVE METABOLIC PANEL
ALBUMIN: 2.9 g/dL — AB (ref 3.5–5.0)
ALK PHOS: 182 U/L — AB (ref 38–126)
ALT: 28 U/L (ref 14–54)
AST: 55 U/L — ABNORMAL HIGH (ref 15–41)
Anion gap: 11 (ref 5–15)
BUN: 14 mg/dL (ref 6–20)
CALCIUM: 8.5 mg/dL — AB (ref 8.9–10.3)
CO2: 42 mmol/L — AB (ref 22–32)
CREATININE: 1.28 mg/dL — AB (ref 0.44–1.00)
Chloride: 78 mmol/L — ABNORMAL LOW (ref 101–111)
GFR calc non Af Amer: 41 mL/min — ABNORMAL LOW (ref >60)
GFR, EST AFRICAN AMERICAN: 48 mL/min — AB (ref >60)
GLUCOSE: 379 mg/dL — AB (ref 65–99)
Potassium: 2.6 mmol/L — CL (ref 3.5–5.1)
SODIUM: 131 mmol/L — AB (ref 135–145)
Total Bilirubin: 1.9 mg/dL — ABNORMAL HIGH (ref 0.3–1.2)
Total Protein: 6.4 g/dL — ABNORMAL LOW (ref 6.5–8.1)

## 2016-03-03 LAB — BASIC METABOLIC PANEL
ANION GAP: 9 (ref 5–15)
BUN: 13 mg/dL (ref 6–20)
CALCIUM: 7.9 mg/dL — AB (ref 8.9–10.3)
CHLORIDE: 83 mmol/L — AB (ref 101–111)
CO2: 40 mmol/L — AB (ref 22–32)
Creatinine, Ser: 1.06 mg/dL — ABNORMAL HIGH (ref 0.44–1.00)
GFR calc non Af Amer: 52 mL/min — ABNORMAL LOW (ref >60)
Glucose, Bld: 276 mg/dL — ABNORMAL HIGH (ref 65–99)
Potassium: 2.6 mmol/L — CL (ref 3.5–5.1)
Sodium: 132 mmol/L — ABNORMAL LOW (ref 135–145)

## 2016-03-03 LAB — CBC
HCT: 41.6 % (ref 35.0–47.0)
Hemoglobin: 13.6 g/dL (ref 12.0–16.0)
MCH: 27.5 pg (ref 26.0–34.0)
MCHC: 32.6 g/dL (ref 32.0–36.0)
MCV: 84.2 fL (ref 80.0–100.0)
PLATELETS: 288 10*3/uL (ref 150–440)
RBC: 4.94 MIL/uL (ref 3.80–5.20)
RDW: 19.2 % — ABNORMAL HIGH (ref 11.5–14.5)
WBC: 10 10*3/uL (ref 3.6–11.0)

## 2016-03-03 LAB — LIPASE, BLOOD: Lipase: 33 U/L (ref 11–51)

## 2016-03-03 MED ORDER — POTASSIUM CHLORIDE ER 10 MEQ PO TBCR: 10.0000 meq | EXTENDED_RELEASE_TABLET | Freq: Every day | ORAL | 0 refills | Status: DC

## 2016-03-03 MED ORDER — POTASSIUM CHLORIDE IN NACL 20-0.9 MEQ/L-% IV SOLN
Freq: Once | INTRAVENOUS | Status: AC
  Administered 2016-03-03: 16:00:00 via INTRAVENOUS
  Filled 2016-03-03: qty 1000

## 2016-03-03 MED ORDER — POTASSIUM CHLORIDE CRYS ER 20 MEQ PO TBCR
40.0000 meq | EXTENDED_RELEASE_TABLET | Freq: Once | ORAL | Status: AC
  Administered 2016-03-03: 40 meq via ORAL
  Filled 2016-03-03: qty 2

## 2016-03-03 NOTE — Telephone Encounter (Signed)
Don has been informed.

## 2016-03-03 NOTE — Telephone Encounter (Signed)
Blood sugars- Friday L-491 D-484, Saturday M-206 D-150, Sunday M-265 L-309 , Monday M-208 L-309, Tuesday M-184 L-451, D-307, Wednesday M-166 L-370 D-259, Thursday M-162.  *M- Morning *L- Lunch *D- Dinner  Patient has barely eaten anything, so bowel movements have not been to frequent.  Patient has had loss of appetite, feeling very drained, Lethargic, Black tarry stools, nauseated, and little abdominal pain. Patient having Diarrhea past two days.  Patients caregiver also stated she has been having issues making it in time to bathroom when the urgency to void bowels. Patient has also fallen a few more times.  Instructed patients caregiver to take her to ED for her SX.  Patients caregiver said they would comply and also she had no questions, comments, or concerns at this time.

## 2016-03-03 NOTE — Telephone Encounter (Signed)
You may call and give verbal on my behalf  Thank you

## 2016-03-03 NOTE — ED Notes (Signed)
Pt sts she is unable to give urine sample at this time 

## 2016-03-03 NOTE — Telephone Encounter (Signed)
Please advise 

## 2016-03-03 NOTE — Telephone Encounter (Signed)
Please call and triage further-  What are blood sugars?  How many times diarrhea? And how many days? Any formed stools? Any blood?  Any abdominal pain?  What is her weight today ?  Concerned as I'm not in the office and she may need to be seen. She canceled appointment earlier this week for UTI and not sure if those symptoms are still lingering as well.   Let me know.

## 2016-03-03 NOTE — Telephone Encounter (Signed)
Thank you Mal Amabile for being so thorough and prompt in triaging this morning.  I could agree with you more that she needs to be seen in ED.  I'll follow her chart in ED.   Thank you again

## 2016-03-03 NOTE — ED Triage Notes (Signed)
Pt c/o diarrhea, nausea, and decreased appetite for 1-2 weeks. Pt reports when she eats feels like the food just sits in her stomach. Has had decreased urinary output as well per pt. Denies pain.

## 2016-03-03 NOTE — Progress Notes (Signed)
Patient has been notified. No questions, comments, or concerns at this time.

## 2016-03-03 NOTE — Telephone Encounter (Signed)
Left a detailed message to [facility].  If he has questions I instructed that he call back at [phone]

## 2016-03-03 NOTE — ED Notes (Signed)
Kinner at bedside. 

## 2016-03-03 NOTE — ED Notes (Signed)
Pharmacy called about IV medications, Barbara Cower stated medication being sent.

## 2016-03-03 NOTE — ED Notes (Signed)
As RN leaving room caregiver also reports pt is lightheaded.

## 2016-03-03 NOTE — ED Notes (Signed)
Report given to Nellie, RN.  

## 2016-03-03 NOTE — ED Provider Notes (Signed)
Spoke with patient about repeat labs. She feels quite well and is anxious to go home. She notes she is supposed to be on potassium supplementation but had a problem with her prescriptions and only recently obtained them. She will start taking her potassium again at home. She agrees to return if any worsening of her symptoms. She states her glucose is "always" in the 300's   Jene Every, MD 03/03/16 1807

## 2016-03-03 NOTE — Telephone Encounter (Signed)
Don from [facility] requested a verbal order to continue care until next week for occupational therapy  Contact [phone]

## 2016-03-03 NOTE — ED Provider Notes (Signed)
[facility] Emergency Department Provider Note        Time seen: ----------------------------------------- 2:27 PM on 03/03/2016 -----------------------------------------    I have reviewed the triage vital signs and the nursing notes.   HISTORY  Chief Complaint Diarrhea    HPI [name] is a 71 y.o. female who presents to ER for diarrhea, nausea and poor appetite for the last several weeks. Patient reports when she eats it feels like food just sits in her stomach. She is also had decreased urine output according to her caregivers. She denies fevers, chills or other complaints.   Past Medical History:  Diagnosis Date  . Anemia   . Asthma   . CHF (congestive heart failure) (HCC)   . CVA (cerebral vascular accident) (HCC)   . Diabetes mellitus without complication (HCC)   . GERD (gastroesophageal reflux disease)   . Heart murmur   . History of hiatal hernia   . Hypertension   . Panic attacks   . Peripheral vascular disease (HCC)   . Restless leg syndrome   . Shortness of breath dyspnea     Patient Active Problem List   Diagnosis Date Noted  . Morbid obesity (HCC) 02/02/2016  . HTN (hypertension) 01/06/2016  . Routine physical examination 12/30/2015  . Recurrent falls 12/13/2015  . Fatty liver 12/10/2015  . Fracture of multiple ribs 11/25/2015  . B12 deficiency anemia 11/03/2015  . GERD (gastroesophageal reflux disease) 10/17/2015  . Pure hypercholesterolemia 10/17/2015  . Type 2 diabetes mellitus (HCC) 10/05/2015  . Depression 10/05/2015  . Rash and nonspecific skin eruption 10/05/2015  . CHF (congestive heart failure) (HCC) 03/13/2015  . Carotid stenosis 07/24/2014    Past Surgical History:  Procedure Laterality Date  . ABDOMINAL HYSTERECTOMY    . BREAST BIOPSY Right yrs ago   benign  . ENDARTERECTOMY Right 07/24/2014   Procedure: ENDARTERECTOMY CAROTID;  Surgeon: Annice Needy, MD;  Location: [facility] ORS;  Service: Vascular;   Laterality: Right;  . EYE SURGERY Left    cataract  . FRACTURE SURGERY Left    fractured ankle  . TONSILLECTOMY      Allergies Codeine; Liraglutide; and Wheat bran  Social History Social History  Substance Use Topics  . Smoking status: Never Smoker  . Smokeless tobacco: Never Used  . Alcohol use No    Review of Systems Constitutional: Negative for fever.Positive for weakness, decreased appetite Cardiovascular: Negative for chest pain. Respiratory: Negative for shortness of breath. Gastrointestinal: Negative for abdominal pain, positive for diarrhea, nausea Genitourinary: Negative for dysuria. Musculoskeletal: Negative for back pain. Skin: Positive for intermittent rash Neurological: Negative for headaches, focal weakness or numbness.  10-point ROS otherwise negative.  ____________________________________________   PHYSICAL EXAM:  VITAL SIGNS: ED Triage Vitals  Enc Vitals Group     BP 03/03/16 1310 132/65     Pulse Rate 03/03/16 1310 84     Resp 03/03/16 1310 16     Temp 03/03/16 1310 97.8 F (36.6 C)     Temp Source 03/03/16 1310 Oral     SpO2 03/03/16 1310 93 %     Weight 03/03/16 1313 227 lb (103 kg)     Height 03/03/16 1313 5\' 2"  (1.575 m)     Head Circumference --      Peak Flow --      Pain Score --      Pain Loc --      Pain Edu? --      Excl. in GC? --  Constitutional: Alert and oriented. Well appearing and in no distress.Morbidly obese Eyes: Conjunctivae are normal. PERRL. Normal extraocular movements. ENT   Head: Normocephalic and atraumatic.   Nose: No congestion/rhinnorhea.   Mouth/Throat: Mucous membranes are moist.   Neck: No stridor. Cardiovascular: Normal rate, regular rhythm. No murmurs, rubs, or gallops. Respiratory: Normal respiratory effort without tachypnea nor retractions. Breath sounds are clear and equal bilaterally. No wheezes/rales/rhonchi. Gastrointestinal: Soft and nontender. Normal bowel  sounds Musculoskeletal: Nontender with normal range of motion in all extremities. No lower extremity tenderness nor edema. Neurologic:  Normal speech and language. No gross focal neurologic deficits are appreciated.  Skin:  Healing rashes noted beneath her abdominal wall pannus Psychiatric: Mood and affect are normal. Speech and behavior are normal.  ____________________________________________  EKG: Interpreted by me. Sinus rhythm rate 83 bpm, normal PR interval, left axis deviation, septal infarct, long QT  ____________________________________________  ED COURSE:  Pertinent labs & imaging results that were available during my care of the patient were reviewed by me and considered in my medical decision making (see chart for details). Clinical Course   Patient presents to the ER for weakness and diarrhea. We will assess with labs, give IV fluids and reevaluate.  Procedures ____________________________________________   LABS (pertinent positives/negatives)  Labs Reviewed  COMPREHENSIVE METABOLIC PANEL - Abnormal; Notable for the following:       Result Value   Sodium 131 (*)    Potassium 2.6 (*)    Chloride 78 (*)    CO2 42 (*)    Glucose, Bld 379 (*)    Creatinine, Ser 1.28 (*)    Calcium 8.5 (*)    Total Protein 6.4 (*)    Albumin 2.9 (*)    AST 55 (*)    Alkaline Phosphatase 182 (*)    Total Bilirubin 1.9 (*)    GFR calc non Af Amer 41 (*)    GFR calc Af Amer 48 (*)    All other components within normal limits  CBC - Abnormal; Notable for the following:    RDW 19.2 (*)    All other components within normal limits  LIPASE, BLOOD  URINALYSIS, COMPLETE (UACMP) WITH MICROSCOPIC  BASIC METABOLIC PANEL   ____________________________________________  FINAL ASSESSMENT AND PLAN  Diarrhea, hypokalemia  Plan: Patient with labs as dictated above. Patient is in no acute distress and looks medically stable. She was given IV and oral potassium. We will repeat a basic  metabolic panel. Patient care checked out to Dr. Cyril Loosen for final disposition.   Emily Filbert, MD   Note: This dictation was prepared with Dragon dictation. Any transcriptional errors that result from this process are unintentional    Emily Filbert, MD 03/03/16 1524

## 2016-03-04 ENCOUNTER — Ambulatory Visit: Payer: Medicare Other

## 2016-03-04 ENCOUNTER — Telehealth: Payer: Self-pay | Admitting: Family

## 2016-03-04 ENCOUNTER — Telehealth: Payer: Self-pay | Admitting: *Deleted

## 2016-03-04 DIAGNOSIS — Z794 Long term (current) use of insulin: Principal | ICD-10-CM

## 2016-03-04 DIAGNOSIS — E119 Type 2 diabetes mellitus without complications: Secondary | ICD-10-CM

## 2016-03-04 DIAGNOSIS — E876 Hypokalemia: Secondary | ICD-10-CM

## 2016-03-04 MED ORDER — INSULIN ASPART 100 UNIT/ML ~~LOC~~ SOLN: 9.0000 [IU] | Freq: Three times a day (TID) | SUBCUTANEOUS | 11 refills | Status: DC

## 2016-03-04 MED ORDER — POTASSIUM CHLORIDE ER 10 MEQ PO TBCR: 10.0000 meq | EXTENDED_RELEASE_TABLET | Freq: Every day | ORAL | 0 refills | Status: DC

## 2016-03-04 NOTE — Telephone Encounter (Signed)
[facility] requested to have a different insulin prescribed, other than Novolog due to insurance issues

## 2016-03-04 NOTE — Telephone Encounter (Signed)
Caregiver was informed. She stated that leg was a little swollen.  As of right now weight has been alright. Patient is regaining her appetite.  Caregiver understood medication directions and stated she will have patient comply.  Caregiver had no further questions, comments, or concerns.

## 2016-03-04 NOTE — Telephone Encounter (Signed)
Call pt-  In ed yesterday her potassium was very low, likely worsened by diarrhea and also diuretics.   Does she have swelling in her legs?  Is her weight stable?   Let's stop the lasix for now until I see her next week.   She is also on HCTZ with causes potassium to go low.   She may stay on the 10 meq potassium that the ED prescribed for now however we may take her off if next week or change the HCTZ to HCTZ-triamterene.  She may STOP the 20 meq potassium which she had picked up at pharmacy ( I spoke with them). Please ensure she knows to ONLY take 10 meq versus 20 meq.   We see her next week to ensure her electrolytes are back to normal.    I also reordered novolog.

## 2016-03-04 NOTE — Telephone Encounter (Signed)
Please advise 

## 2016-03-04 NOTE — Telephone Encounter (Signed)
Pt called requesting a refill on insulin aspart (NOVOLOG) 100 UNIT/ML injection. Please advise, thank you!  Pharmacy - [facility] - [place], [place] - [address]  Call pt @ [phone]

## 2016-03-04 NOTE — Telephone Encounter (Signed)
[name] has already reordered medication.

## 2016-03-06 ENCOUNTER — Encounter: Payer: Self-pay | Admitting: Family

## 2016-03-06 ENCOUNTER — Telehealth: Payer: Self-pay | Admitting: Family

## 2016-03-06 DIAGNOSIS — E119 Type 2 diabetes mellitus without complications: Secondary | ICD-10-CM

## 2016-03-06 DIAGNOSIS — Z794 Long term (current) use of insulin: Principal | ICD-10-CM

## 2016-03-06 NOTE — Telephone Encounter (Signed)
[name], Would you update ms pashcal's meds?   Noted below from last derm note:  prednisone 20mg  daily -  dapsone  50mg  daily - Start triamcinolone 0.1% cream twice daily.

## 2016-03-07 MED ORDER — INSULIN ASPART 100 UNIT/ML FLEXPEN: 9.0000 [IU] | PEN_INJECTOR | Freq: Three times a day (TID) | SUBCUTANEOUS | 1 refills | Status: DC

## 2016-03-07 MED ORDER — INSULIN LISPRO 100 UNIT/ML (KWIKPEN): 9.0000 [IU] | PEN_INJECTOR | Freq: Three times a day (TID) | SUBCUTANEOUS | 1 refills | Status: DC

## 2016-03-07 NOTE — Telephone Encounter (Signed)
Spoke with tanya at pharmacy; clarified insulin and ordered FlexPen per insurance

## 2016-03-07 NOTE — Telephone Encounter (Signed)
Changed to humalog as novolog not covered

## 2016-03-08 ENCOUNTER — Telehealth: Payer: Self-pay | Admitting: Family

## 2016-03-08 ENCOUNTER — Other Ambulatory Visit: Payer: Self-pay

## 2016-03-08 DIAGNOSIS — R110 Nausea: Secondary | ICD-10-CM

## 2016-03-08 NOTE — Telephone Encounter (Signed)
Call pt  Get vitals, weight,  What are blood sugars today?  Can she come in for labs? Pended.   Concerned for her potassium had been low since ER.

## 2016-03-08 NOTE — Telephone Encounter (Signed)
Pt caregiver states pt keeps complaining about a knot in the middle of her stomach. She cannot keep anything down etc water or food. No appetite, nauseated and diarrhea last night. Pt has a appt to come in tomorrow. This has been going on since about three weeks. Please advise?   Call pt @ [phone]. Thank you!

## 2016-03-08 NOTE — Telephone Encounter (Signed)
Called pt already so we are good-   FYI  Fasting BS 199, had few bites soup for lunch. 3-4 bouts of diarrhea last night, 2 times today, 'slowing down'. No chest pain.   Weight today is 227.  No orthopnea, SOB.    Stopped lasix.   Describes 'knot in center in belly.' Not severe. Had cramps earlier. No blood in stool, fever. Excessive gas.   Has novolog pen with meals, doing 9 units. No hypoglycemia. No BS in 300s in past couple of days.   Taking dapsone. Explained that derm wanted her to be on prednisone.   Prefers to wait until tomorrow for labs, stool.   Education provided bland diet, hydration ( small sips, popsciles)

## 2016-03-08 NOTE — Telephone Encounter (Signed)
Please advise 

## 2016-03-08 NOTE — Telephone Encounter (Signed)
Stool cultures also  Call pt

## 2016-03-09 ENCOUNTER — Ambulatory Visit: Payer: Medicare Other

## 2016-03-09 ENCOUNTER — Encounter: Payer: Self-pay | Admitting: Family

## 2016-03-09 ENCOUNTER — Ambulatory Visit
Admission: RE | Admit: 2016-03-09 | Discharge: 2016-03-09 | Disposition: A | Discharge status: Home / Self Care | Payer: Medicare Other | Source: Ambulatory Visit | Attending: Family | Admitting: Family

## 2016-03-09 ENCOUNTER — Ambulatory Visit (INDEPENDENT_AMBULATORY_CARE_PROVIDER_SITE_OTHER): Payer: Medicare Other | Admitting: Family

## 2016-03-09 VITALS — BP 102/60 | HR 73 | Temp 97.8°F | Ht 62.0 in | Wt 231.0 lb

## 2016-03-09 DIAGNOSIS — Z1211 Encounter for screening for malignant neoplasm of colon: Secondary | ICD-10-CM | POA: Diagnosis not present

## 2016-03-09 DIAGNOSIS — E119 Type 2 diabetes mellitus without complications: Secondary | ICD-10-CM

## 2016-03-09 DIAGNOSIS — K7460 Unspecified cirrhosis of liver: Secondary | ICD-10-CM | POA: Insufficient documentation

## 2016-03-09 DIAGNOSIS — R1032 Left lower quadrant pain: Secondary | ICD-10-CM | POA: Diagnosis not present

## 2016-03-09 DIAGNOSIS — R1010 Upper abdominal pain, unspecified: Secondary | ICD-10-CM

## 2016-03-09 DIAGNOSIS — Z794 Long term (current) use of insulin: Secondary | ICD-10-CM

## 2016-03-09 DIAGNOSIS — I7090 Unspecified atherosclerosis: Secondary | ICD-10-CM | POA: Insufficient documentation

## 2016-03-09 DIAGNOSIS — K8020 Calculus of gallbladder without cholecystitis without obstruction: Secondary | ICD-10-CM | POA: Insufficient documentation

## 2016-03-09 DIAGNOSIS — I5032 Chronic diastolic (congestive) heart failure: Secondary | ICD-10-CM

## 2016-03-09 DIAGNOSIS — L138 Other specified bullous disorders: Secondary | ICD-10-CM

## 2016-03-09 MED ORDER — TRAMADOL HCL 50 MG PO TABS: 50.0000 mg | ORAL_TABLET | Freq: Every day | ORAL | 0 refills | Status: DC | PRN

## 2016-03-09 MED ORDER — OMEPRAZOLE 20 MG PO CPDR: 20.0000 mg | DELAYED_RELEASE_CAPSULE | Freq: Every day | ORAL | 3 refills | Status: AC

## 2016-03-09 MED ORDER — IOPAMIDOL (ISOVUE-300) INJECTION 61%
100.0000 mL | Freq: Once | INTRAVENOUS | Status: AC | PRN
  Administered 2016-03-09: 100 mL via INTRAVENOUS

## 2016-03-09 NOTE — Patient Instructions (Addendum)
Labs CT abdomen  Suspect acid reflux contributing, start back on prilosec  Ensure having 3 meals per day. Give insulin with meals as long as blood sugar 150.  Check blood sugar before meal, and give 9 units WITH meal if ABOVE 150.   May continue on  Hydrochlorothiazide, this will help with leg swelling.   Take lasix only as needed for leg swelling.    Per dermatology:  Stay on vit E, iron, folic acid,  Increase dapsone to 50mg  daily  If the rash worsens, call derm and have them prednisone. Know that prednisone WILL Increase blood sugars.

## 2016-03-09 NOTE — Progress Notes (Signed)
Subjective:    Patient ID: [name], female    DOB: Jul 07, 1945, 71 y.o.   MRN: [id number]  CC: [name] is a 71 y.o. female who presents today for follow up.   HPI: CC: abdominal pain x 3-4 days. Middle of stomach describes ache. Diarrhea yesterday, resolved. No severe abdominal pain.  After eating, very nauseated. Endorses Epigastric burning, sour taste in mouth. Tried pepto bismal, imodium with some relief. Weight stable. Denies exertional chest pain or pressure, numbness or tingling radiating to left arm or jaw, palpitations, dizziness, frequent headaches, changes in vision, or shortness of breath.   Had nutritional shake this morning. Blood sugars today -191, 176, 192.   Not on prednisone currently.   Caregiver reports that hadn't taken her potassium the week prior to ED visit as pill was 'too big.' Since then, she has been taking 10 meq potassium and breaks them in half.   Requests something for pain for her rash. Has been taking old percocet and run out.       HISTORY:  Past Medical History:  Diagnosis Date  . Anemia   . Asthma   . CHF (congestive heart failure) ([place])   . CVA (cerebral vascular accident) ([place])   . Diabetes mellitus without complication ([place])   . GERD (gastroesophageal reflux disease)   . Heart murmur   . History of hiatal hernia   . Hypertension   . Panic attacks   . Peripheral vascular disease ([place])   . Restless leg syndrome   . Shortness of breath dyspnea    Past Surgical History:  Procedure Laterality Date  . ABDOMINAL HYSTERECTOMY    . BREAST BIOPSY Right yrs ago   benign  . ENDARTERECTOMY Right 07/24/2014   Procedure: ENDARTERECTOMY CAROTID;  Surgeon: Algernon Huxley, MD;  Location: [facility] ORS;  Service: Vascular;  Laterality: Right;  . EYE SURGERY Left    cataract  . FRACTURE SURGERY Left    fractured ankle  . TONSILLECTOMY     Family History  Problem Relation Age of Onset  . Pancreatic cancer Mother   . CAD Father   .  Hypertension Father   . Pancreatic cancer Father   . CAD Brother   . Breast cancer Maternal Aunt     pt states several maternal aunts    Allergies: Codeine; Liraglutide; and Wheat bran Current Outpatient Prescriptions on File Prior to Visit  Medication Sig Dispense Refill  . acetaminophen (TYLENOL) 325 MG tablet Take 2 tablets (650 mg total) by mouth every 6 (six) hours as needed for mild pain (or Fever >/= 101).    Marland Kitchen albuterol (PROVENTIL) (2.5 MG/3ML) 0.083% nebulizer solution Take 3 mLs (2.5 mg total) by nebulization every 4 (four) hours as needed for wheezing. 75 mL 12  . aspirin EC 81 MG tablet Take 81 mg by mouth daily.    . Cyanocobalamin (B-12) 1000 MCG/ML KIT Inject 1,000 mcg as directed every 30 (thirty) days.    . dapsone 25 MG tablet Take 50 mg by mouth daily.    . ferrous sulfate 325 (65 FE) MG tablet Take 1 tablet (325 mg total) by mouth daily with breakfast. 30 tablet 3  . gabapentin (NEURONTIN) 800 MG tablet Take 800 mg by mouth 2 (two) times daily.    . hydrochlorothiazide (HYDRODIURIL) 12.5 MG tablet Take 12.5 mg by mouth 2 (two) times daily.     . insulin glargine (LANTUS) 100 UNIT/ML injection Inject 0.15 mLs (15 Units total) into  the skin at bedtime. 10 mL 3  . insulin lispro (HUMALOG KWIKPEN) 100 UNIT/ML KiwkPen Inject 0.09 mLs (9 Units total) into the skin 3 (three) times daily with meals. HOLD if pre meal blood sugar less than 150. 5 pen 1  . lovastatin (MEVACOR) 20 MG tablet Take 20 mg by mouth at bedtime.    . potassium chloride (K-DUR) 10 MEQ tablet Take 1 tablet (10 mEq total) by mouth daily. 15 tablet 0   No current facility-administered medications on file prior to visit.     Social History  Substance Use Topics  . Smoking status: Never Smoker  . Smokeless tobacco: Never Used  . Alcohol use No    Review of Systems  Constitutional: Positive for appetite change. Negative for chills and fever.  Respiratory: Negative for cough.   Cardiovascular: Positive  for leg swelling (chronic). Negative for chest pain and palpitations.  Gastrointestinal: Positive for diarrhea and nausea. Negative for abdominal distention, abdominal pain and vomiting.      Objective:    BP 102/60   Pulse 73   Temp 97.8 F (36.6 C) (Oral)   Ht 5' 2"  (1.575 m)   Wt 231 lb (104.8 kg)   BMI 42.25 kg/m  BP Readings from Last 3 Encounters:  03/09/16 102/60  03/03/16 108/63  02/25/16 126/66   Wt Readings from Last 3 Encounters:  03/09/16 231 lb (104.8 kg)  03/03/16 227 lb (103 kg)  02/25/16 233 lb (105.7 kg)    Physical Exam  Constitutional: She appears well-developed and well-nourished.  Eyes: Conjunctivae are normal.  Cardiovascular: Normal rate, regular rhythm, normal heart sounds and normal pulses.   +1 pitting BLE edema. No calf tenderness or asymmetry.   Pulmonary/Chest: Effort normal and breath sounds normal. She has no wheezes. She has no rhonchi. She has no rales.  Abdominal: Soft. Normal appearance and bowel sounds are normal. She exhibits no distension, no fluid wave, no ascites and no mass. There is generalized tenderness. There is no rigidity, no rebound, no guarding and no CVA tenderness. No hernia.  Neurological: She is alert.  Skin: Skin is warm and dry.  Psychiatric: She has a normal mood and affect. Her speech is normal and behavior is normal. Thought content normal.  Vitals reviewed.      Assessment & Plan:   Problem List Items Addressed This Visit      Cardiovascular and Mediastinum   CHF (congestive heart failure) ([place])    Stable weight. BP well controlled.  Bilateral + 1 edema. No adventitious lung sounds. Patient has held Lasix since emergency room visit. Advised her to take a dose of Lasix and continue taking hydrochlorothiazide as long as patient is taking 10 mEq of potassium. No signs of acute HF exacerbation without orthopnea, shortness of breath. Pending BMP.       Relevant Medications   furosemide (LASIX) 40 MG tablet      Endocrine   Type 2 diabetes mellitus (HCC)    Uncontrolled. Working with patient on diet and SSI. Will address acute pain today and follow on DM.         Musculoskeletal and Integument   Linear IgA bullous dermatosis    Chronic. Understandably a lot of pain from sores on abdomen. Agreed to give tramadol once per day PRN. Advised to stop and THROW away all other narcotics due to h/o falls. On dapsone. Not on prednisone at this time. Reviewed last derm note with patient and advised that if she did better  on prednisone ( she has run out) , she would need to call and ask for refill. Advised that blood sugars would increase however if it helped, then we would manage.       Relevant Medications   traMADol (ULTRAM) 50 MG tablet     Other   Pain of upper abdomen - Primary    Etiology remains unclear at this time. During visit, patient had a hard time quantifying pain and describing were pain was. Due to caregivers concern as patient had been complaining about this for days, patient, caregivers and I jointly decided to pursue CT abdomen and pelvis. I'm reassured a CT abdomen pelvis shows no acute findings and explained this to caregiver over the phone last night. Patient does have reflux and has run out of her prescription of Prilosec which is been refilled today. Considering GERD as etiology. Advised to start this medication prilosec to see if improves pain. Suspect uncontrolled diabetes contributed as well. Pending stool cards as well. Continue to follow patient closely.       Relevant Orders   CT ABDOMEN PELVIS W CONTRAST (Completed)   C. difficile GDH and Toxin A/B   Stool culture    Other Visit Diagnoses    Encounter for screening fecal occult blood testing       Relevant Orders   Fecal occult blood, imunochemical   LLQ pain       Relevant Orders   CT ABDOMEN PELVIS W CONTRAST (Completed)       I have discontinued Ms. Abundis's loratadine, clobetasol ointment, triamcinolone,  oxyCODONE-acetaminophen, magnesium oxide, white petrolatum, Cholecalciferol, insulin aspart, and predniSONE. I have also changed her omeprazole. Additionally, I am having her start on traMADol. Lastly, I am having her maintain her hydrochlorothiazide, lovastatin, aspirin EC, gabapentin, ferrous sulfate, acetaminophen, albuterol, B-12, dapsone, insulin glargine, potassium chloride, insulin lispro, and furosemide.   Meds ordered this encounter  Medications  . DISCONTD: potassium chloride (K-DUR,KLOR-CON) 10 MEQ tablet    Refill:  0  . DISCONTD: predniSONE (DELTASONE) 20 MG tablet    Refill:  0  . furosemide (LASIX) 40 MG tablet    Refill:  0  . omeprazole (PRILOSEC) 20 MG capsule    Sig: Take 1 capsule (20 mg total) by mouth at bedtime.    Dispense:  90 capsule    Refill:  3  . traMADol (ULTRAM) 50 MG tablet    Sig: Take 1 tablet (50 mg total) by mouth daily as needed.    Dispense:  30 tablet    Refill:  0    Order Specific Question:   Supervising Provider    Answer:   Crecencio Mc [[id number]]    Return precautions given.   Risks, benefits, and alternatives of the medications and treatment plan prescribed today were discussed, and patient expressed understanding.   Education regarding symptom management and diagnosis given to patient on AVS.  Continue to follow with Mable Paris, FNP for routine health maintenance.   [place] and I agreed with plan.   Mable Paris, FNP

## 2016-03-10 ENCOUNTER — Telehealth: Payer: Self-pay | Admitting: Family

## 2016-03-10 DIAGNOSIS — R1010 Upper abdominal pain, unspecified: Secondary | ICD-10-CM | POA: Insufficient documentation

## 2016-03-10 NOTE — Assessment & Plan Note (Signed)
Uncontrolled. Working with patient on diet and SSI. Will address acute pain today and follow on DM.

## 2016-03-10 NOTE — Telephone Encounter (Signed)
Patient was informed of results.  Patient understood and no questions, comments, or concerns at this time. Patient will be doing labs tomorrow @ 1000.

## 2016-03-10 NOTE — Telephone Encounter (Signed)
Call pt-  She left yesterday without labs  Can she do them today?

## 2016-03-10 NOTE — Assessment & Plan Note (Signed)
Stable weight. BP well controlled.  Bilateral + 1 edema. No adventitious lung sounds. Patient has held Lasix since emergency room visit. Advised her to take a dose of Lasix and continue taking hydrochlorothiazide as long as patient is taking 10 mEq of potassium. No signs of acute HF exacerbation without orthopnea, shortness of breath. Pending BMP.

## 2016-03-10 NOTE — Assessment & Plan Note (Signed)
Etiology remains unclear at this time. During visit, patient had a hard time quantifying pain and describing were pain was. Due to caregivers concern as patient had been complaining about this for days, patient, caregivers and I jointly decided to pursue CT abdomen and pelvis. I'm reassured a CT abdomen pelvis shows no acute findings and explained this to caregiver over the phone last night. Patient does have reflux and has run out of her prescription of Prilosec which is been refilled today. Considering GERD as etiology. Advised to start this medication prilosec to see if improves pain. Suspect uncontrolled diabetes contributed as well. Pending stool cards as well. Continue to follow patient closely.

## 2016-03-10 NOTE — Assessment & Plan Note (Addendum)
Chronic. Understandably a lot of pain from sores on abdomen. Agreed to give tramadol once per day PRN. Advised to stop and THROW away all other narcotics due to h/o falls. On dapsone. Not on prednisone at this time. Reviewed last derm note with patient and advised that if she did better on prednisone ( she has run out) , she would need to call and ask for refill. Advised that blood sugars would increase however if it helped, then we would manage.

## 2016-03-11 ENCOUNTER — Other Ambulatory Visit (INDEPENDENT_AMBULATORY_CARE_PROVIDER_SITE_OTHER): Payer: Medicare Other

## 2016-03-11 DIAGNOSIS — R110 Nausea: Secondary | ICD-10-CM

## 2016-03-11 LAB — CBC WITH DIFFERENTIAL/PLATELET
BASOS ABS: 0.4 10*3/uL — AB (ref 0.0–0.1)
Basophils Relative: 4.1 % — ABNORMAL HIGH (ref 0.0–3.0)
EOS ABS: 0.2 10*3/uL (ref 0.0–0.7)
Eosinophils Relative: 2.3 % (ref 0.0–5.0)
HCT: 38.3 % (ref 36.0–46.0)
Hemoglobin: 12.3 g/dL (ref 12.0–15.0)
LYMPHS PCT: 23.7 % (ref 12.0–46.0)
Lymphs Abs: 2.3 10*3/uL (ref 0.7–4.0)
MCHC: 32.2 g/dL (ref 30.0–36.0)
MCV: 88.4 fl (ref 78.0–100.0)
MONOS PCT: 9.2 % (ref 3.0–12.0)
Monocytes Absolute: 0.9 10*3/uL (ref 0.1–1.0)
NEUTROS PCT: 60.7 % (ref 43.0–77.0)
Neutro Abs: 5.9 10*3/uL (ref 1.4–7.7)
Platelets: 215 10*3/uL (ref 150.0–400.0)
RBC: 4.33 Mil/uL (ref 3.87–5.11)
RDW: 20.9 % — ABNORMAL HIGH (ref 11.5–15.5)
WBC: 9.7 10*3/uL (ref 4.0–10.5)

## 2016-03-11 LAB — COMPREHENSIVE METABOLIC PANEL
ALK PHOS: 137 U/L — AB (ref 39–117)
ALT: 19 U/L (ref 0–35)
AST: 33 U/L (ref 0–37)
Albumin: 2.6 g/dL — ABNORMAL LOW (ref 3.5–5.2)
BILIRUBIN TOTAL: 1.3 mg/dL — AB (ref 0.2–1.2)
BUN: 14 mg/dL (ref 6–23)
CO2: 39 meq/L — AB (ref 19–32)
CREATININE: 1.01 mg/dL (ref 0.40–1.20)
Calcium: 8.6 mg/dL (ref 8.4–10.5)
Chloride: 88 mEq/L — ABNORMAL LOW (ref 96–112)
GFR: 57.58 mL/min — AB (ref >60.00)
GLUCOSE: 244 mg/dL — AB (ref 70–99)
Potassium: 3.4 mEq/L — ABNORMAL LOW (ref 3.5–5.1)
SODIUM: 133 meq/L — AB (ref 135–145)
TOTAL PROTEIN: 5.3 g/dL — AB (ref 6.0–8.3)

## 2016-03-11 LAB — LIPASE: LIPASE: 54 U/L (ref 11.0–59.0)

## 2016-03-11 NOTE — Addendum Note (Signed)
Addended by: Warden Fillers on: 03/11/2016 10:23 AM   Modules accepted: Orders

## 2016-03-12 ENCOUNTER — Other Ambulatory Visit: Payer: Self-pay | Admitting: Family

## 2016-03-12 DIAGNOSIS — E876 Hypokalemia: Secondary | ICD-10-CM

## 2016-03-15 ENCOUNTER — Ambulatory Visit (INDEPENDENT_AMBULATORY_CARE_PROVIDER_SITE_OTHER): Payer: Medicare Other

## 2016-03-15 VITALS — BP 102/64 | HR 72 | Temp 98.0°F | Resp 14 | Ht 62.0 in | Wt 231.0 lb

## 2016-03-15 DIAGNOSIS — Z0000 Encounter for general adult medical examination without abnormal findings: Secondary | ICD-10-CM | POA: Diagnosis not present

## 2016-03-15 NOTE — Patient Instructions (Addendum)
[name] , Thank you for taking time to come for your Medicare Wellness Visit. I appreciate your ongoing commitment to your health goals. Please review the following plan we discussed and let me know if I can assist you in the future.   These are the goals we discussed: Goals    . Healthy Lifestyle          Stay hydrated and drink plenty of fluids Stay active and continue physical therapy for exercise. Low carb foods. Lean meats,vegetables.       This is a list of the screening recommended for you and due dates:  Health Maintenance  Topic Date Due  . Complete foot exam   02/04/1956  . Tetanus Vaccine  02/03/1965  . Colon Cancer Screening  02/04/1996  . Shingles Vaccine  02/03/2006  . Pneumonia vaccines (1 of 2 - PCV13) 02/04/2011  . Hemoglobin A1C  06/28/2016  . Eye exam for diabetics  08/10/2016  . Urine Protein Check  12/29/2016  . Mammogram  05/27/2017  . Flu Shot  Completed  . DEXA scan (bone density measurement)  Completed  .  Hepatitis C: One time screening is recommended by Center for Disease Control  (CDC) for  adults born from [birth year] through 1965.   Completed    Fall Prevention in the Home Introduction Falls can cause injuries. They can happen to people of all ages. There are many things you can do to make your home safe and to help prevent falls. What can I do on the outside of my home?  Regularly fix the edges of walkways and driveways and fix any cracks.  Remove anything that might make you trip as you walk through a door, such as a raised step or threshold.  Trim any bushes or trees on the path to your home.  Use bright outdoor lighting.  Clear any walking paths of anything that might make someone trip, such as rocks or tools.  Regularly check to see if handrails are loose or broken. Make sure that both sides of any steps have handrails.  Any raised decks and porches should have guardrails on the edges.  Have any leaves, snow, or ice cleared  regularly.  Use sand or salt on walking paths during winter.  Clean up any spills in your garage right away. This includes oil or grease spills. What can I do in the bathroom?  Use night lights.  Install grab bars by the toilet and in the tub and shower. Do not use towel bars as grab bars.  Use non-skid mats or decals in the tub or shower.  If you need to sit down in the shower, use a plastic, non-slip stool.  Keep the floor dry. Clean up any water that spills on the floor as soon as it happens.  Remove soap buildup in the tub or shower regularly.  Attach bath mats securely with double-sided non-slip rug tape.  Do not have throw rugs and other things on the floor that can make you trip. What can I do in the bedroom?  Use night lights.  Make sure that you have a light by your bed that is easy to reach.  Do not use any sheets or blankets that are too big for your bed. They should not hang down onto the floor.  Have a firm chair that has side arms. You can use this for support while you get dressed.  Do not have throw rugs and other things on the floor that  can make you trip. What can I do in the kitchen?  Clean up any spills right away.  Avoid walking on wet floors.  Keep items that you use a lot in easy-to-reach places.  If you need to reach something above you, use a strong step stool that has a grab bar.  Keep electrical cords out of the way.  Do not use floor polish or wax that makes floors slippery. If you must use wax, use non-skid floor wax.  Do not have throw rugs and other things on the floor that can make you trip. What can I do with my stairs?  Do not leave any items on the stairs.  Make sure that there are handrails on both sides of the stairs and use them. Fix handrails that are broken or loose. Make sure that handrails are as long as the stairways.  Check any carpeting to make sure that it is firmly attached to the stairs. Fix any carpet that is loose  or worn.  Avoid having throw rugs at the top or bottom of the stairs. If you do have throw rugs, attach them to the floor with carpet tape.  Make sure that you have a light switch at the top of the stairs and the bottom of the stairs. If you do not have them, ask someone to add them for you. What else can I do to help prevent falls?  Wear shoes that:  Do not have high heels.  Have rubber bottoms.  Are comfortable and fit you well.  Are closed at the toe. Do not wear sandals.  If you use a stepladder:  Make sure that it is fully opened. Do not climb a closed stepladder.  Make sure that both sides of the stepladder are locked into place.  Ask someone to hold it for you, if possible.  Clearly mark and make sure that you can see:  Any grab bars or handrails.  First and last steps.  Where the edge of each step is.  Use tools that help you move around (mobility aids) if they are needed. These include:  Canes.  Walkers.  Scooters.  Crutches.  Turn on the lights when you go into a dark area. Replace any light bulbs as soon as they burn out.  Set up your furniture so you have a clear path. Avoid moving your furniture around.  If any of your floors are uneven, fix them.  If there are any pets around you, be aware of where they are.  Review your medicines with your doctor. Some medicines can make you feel dizzy. This can increase your chance of falling. Ask your doctor what other things that you can do to help prevent falls. This information is not intended to replace advice given to you by your health care provider. Make sure you discuss any questions you have with your health care provider. Document Released: 12/11/2008 Document Revised: 07/23/2015 Document Reviewed: 03/21/2014  2017 Elsevier     Colonoscopy, Adult A colonoscopy is an exam to look at the entire large intestine. During the exam, a lubricated, bendable tube is inserted into the anus and then passed  into the rectum, colon, and other parts of the large intestine. A colonoscopy is often done as a part of normal colorectal screening or in response to certain symptoms, such as anemia, persistent diarrhea, abdominal pain, and blood in the stool. The exam can help screen for and diagnose medical problems, including:  Tumors.  Polyps.  Inflammation.  Areas of bleeding. Tell a health care provider about:  Any allergies you have.  All medicines you are taking, including vitamins, herbs, eye drops, creams, and over-the-counter medicines.  Any problems you or family members have had with anesthetic medicines.  Any blood disorders you have.  Any surgeries you have had.  Any medical conditions you have.  Any problems you have had passing stool. What are the risks? Generally, this is a safe procedure. However, problems may occur, including:  Bleeding.  A tear in the intestine.  A reaction to medicines given during the exam.  Infection (rare). What happens before the procedure? Eating and drinking restrictions  Follow instructions from your health care provider about eating and drinking, which may include:  A few days before the procedure - follow a low-fiber diet. Avoid nuts, seeds, dried fruit, raw fruits, and vegetables.  1-3 days before the procedure - follow a clear liquid diet. Drink only clear liquids, such as clear broth or bouillon, black coffee or tea, clear juice, clear soft drinks or sports drinks, gelatin desert, and popsicles. Avoid any liquids that contain red or purple dye.  On the day of the procedure - do not eat or drink anything during the 2 hours before the procedure, or within the time period that your health care provider recommends. Bowel prep  If you were prescribed an oral bowel prep to clean out your colon:  Take it as told by your health care provider. Starting the day before your procedure, you will need to drink a large amount of medicated liquid.  The liquid will cause you to have multiple loose stools until your stool is almost clear or light green.  If your skin or anus gets irritated from diarrhea, you may use these to relieve the irritation:  Medicated wipes, such as adult wet wipes with aloe and vitamin E.  A skin soothing-product like petroleum jelly.  If you vomit while drinking the bowel prep, take a break for up to 60 minutes and then begin the bowel prep again. If vomiting continues and you cannot take the bowel prep without vomiting, call your health care provider. General instructions  Ask your health care provider about changing or stopping your regular medicines. This is especially important if you are taking diabetes medicines or blood thinners.  Plan to have someone take you home from the hospital or clinic. What happens during the procedure?  An IV tube may be inserted into one of your veins.  You will be given medicine to help you relax (sedative).  To reduce your risk of infection:  Your health care team will wash or sanitize their hands.  Your anal area will be washed with soap.  You will be asked to lie on your side with your knees bent.  Your health care provider will lubricate a long, thin, flexible tube. The tube will have a camera and a light on the end.  The tube will be inserted into your anus.  The tube will be gently eased through your rectum and colon.  Air will be delivered into your colon to keep it open. You may feel some pressure or cramping.  The camera will be used to take images during the procedure.  A small tissue sample may be removed from your body to be examined under a microscope (biopsy). If any potential problems are found, the tissue will be sent to a lab for testing.  If small polyps are found, your health care provider may remove  them and have them checked for cancer cells.  The tube that was inserted into your anus will be slowly removed. The procedure may vary among  health care providers and hospitals. What happens after the procedure?  Your blood pressure, heart rate, breathing rate, and blood oxygen level will be monitored until the medicines you were given have worn off.  Do not drive for 24 hours after the exam.  You may have a small amount of blood in your stool.  You may pass gas and have mild abdominal cramping or bloating due to the air that was used to inflate your colon during the exam.  It is up to you to get the results of your procedure. Ask your health care provider, or the department performing the procedure, when your results will be ready. This information is not intended to replace advice given to you by your health care provider. Make sure you discuss any questions you have with your health care provider. Document Released: 02/12/2000 Document Revised: 09/04/2015 Document Reviewed: 04/28/2015 Elsevier Interactive Patient Education  2017 [employer].

## 2016-03-15 NOTE — Progress Notes (Addendum)
Subjective:   [name] is a 71 y.o. female who presents for an Initial Medicare Annual Wellness Visit.  Review of Systems    No ROS.  Medicare Wellness Visit.  Cardiac Risk Factors include: advanced age (>57mn, >>1women);diabetes mellitus;obesity (BMI >30kg/m2);hypertension     Objective:    Today's Vitals   03/15/16 0118  BP: 102/64  Pulse: 72  Resp: 14  Temp: 98 F (36.7 C)  TempSrc: Oral  SpO2: 95%  Weight: 231 lb (104.8 kg)  Height: _0  (1.575 m)   Body mass index is 42.25 kg/m.   Current Medications (verified) Outpatient Encounter Prescriptions as of 03/15/2016  Medication Sig  . acetaminophen (TYLENOL) 325 MG tablet Take 2 tablets (650 mg total) by mouth every 6 (six) hours as needed for mild pain (or Fever >/= 101).  .Marland Kitchenalbuterol (PROVENTIL) (2.5 MG/3ML) 0.083% nebulizer solution Take 3 mLs (2.5 mg total) by nebulization every 4 (four) hours as needed for wheezing.  .Marland Kitchenaspirin EC 81 MG tablet Take 81 mg by mouth daily.  . Cyanocobalamin (B-12) 1000 MCG/ML KIT Inject 1,000 mcg as directed every 30 (thirty) days.  . dapsone 25 MG tablet Take 50 mg by mouth daily.  . ferrous sulfate 325 (65 FE) MG tablet Take 1 tablet (325 mg total) by mouth daily with breakfast.  . furosemide (LASIX) 40 MG tablet   . gabapentin (NEURONTIN) 800 MG tablet Take 800 mg by mouth 2 (two) times daily.  . hydrochlorothiazide (HYDRODIURIL) 12.5 MG tablet Take 12.5 mg by mouth 2 (two) times daily.   . insulin glargine (LANTUS) 100 UNIT/ML injection Inject 0.15 mLs (15 Units total) into the skin at bedtime.  . insulin lispro (HUMALOG KWIKPEN) 100 UNIT/ML KiwkPen Inject 0.09 mLs (9 Units total) into the skin 3 (three) times daily with meals. HOLD if pre meal blood sugar less than 150.  . lovastatin (MEVACOR) 20 MG tablet Take 20 mg by mouth at bedtime.  .Marland Kitchenomeprazole (PRILOSEC) 20 MG capsule Take 1 capsule (20 mg total) by mouth at bedtime.  . potassium chloride (K-DUR) 10 MEQ tablet  Take 1 tablet (10 mEq total) by mouth daily.  . traMADol (ULTRAM) 50 MG tablet Take 1 tablet (50 mg total) by mouth daily as needed.   No facility-administered encounter medications on file as of 03/15/2016.     Allergies (verified) Codeine; Liraglutide; and Wheat bran   History: Past Medical History:  Diagnosis Date  . Anemia   . Asthma   . CHF ([place]estive heart failure) ([place]   . CVA (cerebral vascular accident) ([place]   . Diabetes mellitus without complication ([place]   . GERD (gastroesophageal reflux disease)   . Heart murmur   . History of hiatal hernia   . Hypertension   . Panic attacks   . Peripheral vascular disease ([place]   . Restless leg syndrome   . Shortness of breath dyspnea    Past Surgical History:  Procedure Laterality Date  . ABDOMINAL HYSTERECTOMY    . BREAST BIOPSY Right yrs ago   benign  . ENDARTERECTOMY Right 07/24/2014   Procedure: ENDARTERECTOMY CAROTID;  Surgeon: JAlgernon Huxley MD;  Location: [facility] ORS;  Service: Vascular;  Laterality: Right;  . EYE SURGERY Left    cataract  . FRACTURE SURGERY Left    fractured ankle  . TONSILLECTOMY     Family History  Problem Relation Age of Onset  . Pancreatic cancer Mother   . CAD Father   . Hypertension  Father   . Pancreatic cancer Father   . Colon cancer Father   . CAD Brother   . Heart disease Brother     Heart attack  . Breast cancer Maternal Aunt     pt states several maternal aunts   Social History   Occupational History  . Not on file.   Social History Main Topics  . Smoking status: Never Smoker  . Smokeless tobacco: Never Used  . Alcohol use No  . Drug use: No  . Sexual activity: No    Tobacco Counseling Counseling given: Not Answered   Activities of Daily Living In your present state of health, do you have any difficulty performing the following activities: 03/15/2016 02/02/2016  Hearing? N N  Vision? Y N  Difficulty concentrating or making decisions? Tempie Donning  Walking or climbing stairs?  Y Y  Dressing or bathing? Y Y  Doing errands, shopping? Tempie Donning  Preparing Food and eating ? Y -  Using the Toilet? N -  In the past six months, have you accidently leaked urine? Y -  Do you have problems with loss of bowel control? N -  Managing your Medications? Y -  Managing your Finances? Y -  Housekeeping or managing your Housekeeping? Y -  Some recent data might be hidden    Immunizations and Health Maintenance Immunization History  Administered Date(s) Administered  . Influenza, High Dose Seasonal PF 11/25/2015   Health Maintenance Due  Topic Date Due  . FOOT EXAM  02/04/1956  . TETANUS/TDAP  02/03/1965  . COLONOSCOPY  02/04/1996  . ZOSTAVAX  02/03/2006  . PNA vac Low Risk Adult (1 of 2 - PCV13) 02/04/2011    Patient Care Team: Burnard Hawthorne, FNP as PCP - General (Family Medicine) Alisa Graff, FNP as Nurse Practitioner (Family Medicine) Yolonda Kida, MD as Consulting Physician (Cardiology)  Indicate any recent Medical Services you may have received from other than [facility] providers in the past year (date may be approximate).     Assessment:   This is a routine wellness examination for [name].  The goal of the wellness visit is to assist the patient how to close the gaps in care and create a preventative care plan for the patient.   Osteoporosis risk reviewed.  Medications reviewed; taking without issues or barriers.  Safety issues reviewed; smoke detectors in the home. No firearms in the home. Wears seatbelts when riding with others. No violence in the home.  No identified risk were noted; The patient was oriented x 3; appropriate in dress and manner.  Assistance required with meal prep, bathing, dressing, home and medication management.  Walker and/or wheelchair in use when ambulating.  BMI; discussed the importance of a healthy diet, water intake and exercise. Educational material provided.  HTN; followed by PCP.  Colonoscopy; discussed.  Deferred  per patient preference.  She plans to follow up on topic with PCP at a later date.  Educational material provided.  Patient Concerns: No BM x 1 week.  No pain.  She only eats 1-2 bites per meal, drinking Boost for supplement.  Requests allergy lab be added to upcoming lab orders for continued all over body itch.    Deferred to PCP for follow up.  Encouraged to make a follow up with PCP as needed.  Hearing/Vision screen Hearing Screening Comments: Difficulty hearing a whisper  Audiologic testing has been deferred in the past year Vision Screening Comments: Followed by [facility] Wears glasses  Last visit 07/2015 Visual acuity not assessed since she has regular follow up with her ophthalmologist  Dietary issues and exercise activities discussed: Current Exercise Habits: Home exercise routine (Physical therapy), Time (Minutes): 45, Frequency (Times/Week): 2, Weekly Exercise (Minutes/Week): 90, Intensity: Mild  Goals    . Healthy Lifestyle          Stay hydrated and drink plenty of fluids Stay active and continue physical therapy for exercise. Low carb foods. Lean meats,vegetables.      Depression Screen PHQ 2/9 Scores 03/15/2016 02/25/2016 02/02/2016 01/06/2016 11/30/2015  PHQ - 2 Score 0 0 0 0 0    Fall Risk Fall Risk  03/15/2016 02/25/2016 02/02/2016 01/06/2016 11/30/2015  Falls in the past year? _0   Number falls in past yr: 2 or more 2 or more 2 or more - 2 or more  Injury with Fall? Yes Yes Yes - Yes  Risk Factor Category  High Fall Risk High Fall Risk High Fall Risk - High Fall Risk  Risk for fall due to : History of fall(s) History of fall(s) History of fall(s);Impaired balance/gait;Impaired mobility History of fall(s) History of fall(s)  Follow up Education provided;Falls prevention discussed - Falls prevention discussed - -    Cognitive Function: MMSE - Mini Mental State Exam 03/15/2016  Orientation to time 5  Orientation to Place 5  Registration 3    Attention/ Calculation 5  Recall 3  Language- name 2 objects 2  Language- repeat 1  Language- follow 3 step command 3  Language- read & follow direction 1  Write a sentence 1  Copy design 1  Total score 30        Screening Tests Health Maintenance  Topic Date Due  . FOOT EXAM  02/04/1956  . TETANUS/TDAP  02/03/1965  . COLONOSCOPY  02/04/1996  . ZOSTAVAX  02/03/2006  . PNA vac Low Risk Adult (1 of 2 - PCV13) 02/04/2011  . HEMOGLOBIN A1C  06/28/2016  . OPHTHALMOLOGY EXAM  08/10/2016  . URINE MICROALBUMIN  12/29/2016  . MAMMOGRAM  05/27/2017  . INFLUENZA VACCINE  Completed  . DEXA SCAN  Completed  . Hepatitis C Screening  Completed      Plan:    End of life planning; Advance aging; Advanced directives discussed. Copy of current HCPOA/Living Will requested.  Medicare Attestation I have personally reviewed: The patient's medical and social history Their use of alcohol, tobacco or illicit drugs Their current medications and supplements The patient's functional ability including ADLs,fall risks, home safety risks, cognitive, and hearing and visual impairment Diet and physical activities Evidence for depression   The patient's weight, height, BMI, and visual acuity have been recorded in the chart.  I have made referrals and provided education to the patient based on review of the above and I have provided the patient with a written personalized care plan for preventive services.    During the course of the visit, Juliett was educated and counseled about the following appropriate screening and preventive services:   Vaccines to include Pneumoccal, Influenza, Hepatitis B, Td, Zostavax, HCV  Electrocardiogram  Cardiovascular disease screening  Colorectal cancer screening  Bone density screening  Diabetes screening  Glaucoma screening  Mammography/PAP  Nutrition counseling  Smoking cessation counseling  Patient Instructions (the written plan) were given to  the patient.    Varney Biles, LPN   4/40/3474   Agree with plan above. Reviewed patient concerns and have asked nurse to call patient. Mable Paris, FNP.

## 2016-03-18 ENCOUNTER — Telehealth: Payer: Self-pay | Admitting: Family

## 2016-03-18 NOTE — Telephone Encounter (Signed)
Call pt-  She reported constipation and itching to denisa at wellness visit.   Please get more info.   Is she passing gas? When was her last BM?

## 2016-03-18 NOTE — Telephone Encounter (Signed)
Patients caregiver has given her stool softener and benadryl for SX both have worked very well. Patient has had a bowel movement and patient has not been scratching herself.  Patient at the moment is not having SX.

## 2016-03-20 NOTE — Telephone Encounter (Signed)
noted 

## 2016-03-21 ENCOUNTER — Other Ambulatory Visit (INDEPENDENT_AMBULATORY_CARE_PROVIDER_SITE_OTHER): Payer: Medicare Other

## 2016-03-21 DIAGNOSIS — R1010 Upper abdominal pain, unspecified: Secondary | ICD-10-CM | POA: Diagnosis not present

## 2016-03-21 DIAGNOSIS — Z1211 Encounter for screening for malignant neoplasm of colon: Secondary | ICD-10-CM | POA: Diagnosis not present

## 2016-03-22 LAB — C. DIFFICILE GDH AND TOXIN A/B
C. DIFF TOXIN A/B: NOT DETECTED
C. DIFFICILE GDH: NOT DETECTED

## 2016-03-22 LAB — FECAL OCCULT BLOOD, IMMUNOCHEMICAL: FECAL OCCULT BLD: NEGATIVE

## 2016-03-24 ENCOUNTER — Telehealth: Payer: Self-pay | Admitting: *Deleted

## 2016-03-24 NOTE — Telephone Encounter (Signed)
Pt would like to have a medication list faxed over to her new [facility] in [place] .  Pharmacy Contact [phone]

## 2016-03-25 ENCOUNTER — Other Ambulatory Visit: Payer: Self-pay

## 2016-03-25 DIAGNOSIS — E876 Hypokalemia: Secondary | ICD-10-CM

## 2016-03-25 LAB — STOOL CULTURE

## 2016-03-25 MED ORDER — POTASSIUM CHLORIDE ER 10 MEQ PO TBCR: 10.0000 meq | EXTENDED_RELEASE_TABLET | Freq: Every day | ORAL | 2 refills | Status: DC

## 2016-03-25 NOTE — Telephone Encounter (Signed)
Medication list has been faxed.

## 2016-03-25 NOTE — Telephone Encounter (Signed)
Medication has been refilled.

## 2016-03-28 ENCOUNTER — Telehealth: Payer: Self-pay | Admitting: Family

## 2016-03-28 NOTE — Telephone Encounter (Signed)
Please advise 

## 2016-03-28 NOTE — Telephone Encounter (Signed)
Kathie Rhodes from [facility] called and stated that she will be starting Home Health Services for the pt next week.  Any questions to call [phone]

## 2016-03-29 NOTE — Telephone Encounter (Signed)
noted 

## 2016-04-06 ENCOUNTER — Ambulatory Visit: Payer: Medicare Other | Admitting: Dietician

## 2016-04-07 ENCOUNTER — Telehealth: Payer: Self-pay | Admitting: *Deleted

## 2016-04-07 DIAGNOSIS — L138 Other specified bullous disorders: Secondary | ICD-10-CM

## 2016-04-07 NOTE — Telephone Encounter (Signed)
Refill request for Tramadol, last seen 10JAN2018, last filled 10JAN2018.  Please advise.

## 2016-04-07 NOTE — Telephone Encounter (Signed)
Pt requested to have a medication refill for tramadol  Pt requested a cal to discuss having a medication to help her rest at night. Contact [phone]

## 2016-04-11 MED ORDER — TRAMADOL HCL 50 MG PO TABS: 50.0000 mg | ORAL_TABLET | Freq: Every day | ORAL | 0 refills | Status: DC | PRN

## 2016-04-11 NOTE — Telephone Encounter (Signed)
I called pt to inform her Tramadol rx is ready. I spoke to her aide, Alvira Philips. She states the patient is having trouble sleeping, anxiety and is constantly scratching scabs/blisters on her skin. She is seeing dermatology 04/22/16.   She is requesting something to help her sleep. Please advise.

## 2016-04-12 ENCOUNTER — Other Ambulatory Visit: Payer: Self-pay | Admitting: Family

## 2016-04-13 ENCOUNTER — Encounter: Payer: Medicare Other | Attending: Family | Admitting: Dietician

## 2016-04-13 ENCOUNTER — Encounter: Payer: Self-pay | Admitting: Dietician

## 2016-04-13 VITALS — Ht 62.0 in | Wt 235.0 lb

## 2016-04-13 DIAGNOSIS — E119 Type 2 diabetes mellitus without complications: Secondary | ICD-10-CM | POA: Diagnosis present

## 2016-04-13 DIAGNOSIS — Z713 Dietary counseling and surveillance: Secondary | ICD-10-CM | POA: Diagnosis not present

## 2016-04-13 DIAGNOSIS — Z6841 Body Mass Index (BMI) 40.0 and over, adult: Secondary | ICD-10-CM | POA: Insufficient documentation

## 2016-04-13 DIAGNOSIS — Z794 Long term (current) use of insulin: Secondary | ICD-10-CM | POA: Insufficient documentation

## 2016-04-13 NOTE — Progress Notes (Signed)
Medical Nutrition Therapy: Visit start time: 1330  end time: 1430  Assessment:  Diagnosis: DM type II Past medical history: CHF, Depression, GERD, Hypercholesterolemia, B12 deficiency anemia, Fatty liver, HTN Psychosocial issues/ stress concerns: none Preferred learning method:  . Auditory  Current weight: 235lbs Height: 5\' 2"  Medications, supplements: Vit D3, Vit B12, iron, lasix, humalog, lantus, hydrochlorothiazide  Progress and evaluation: Patient expresses desire to lose weight to aid in healing of sores between stomach folds. Has been actively trying to lose weight. CBW 3# below last reported weight. MD has also increased diuretic dosage. Navigates mainly via wheelchair. Caregivers aid in cooking and performing daily household tasks. Appetite has been decreased for several months, though has slightly improved over the past month. Does not eat large portions and describes herself as a picky eater due to nothing being "appealing to her." RD reminded patient of the importance of regular food intake to maintain steady blood glucose levels. Patient voices understanding. Reports drinking Ensure supplements on occasion. Glucerna was encouraged d/t DM dx. Patient states she will switch to Glucerna or the Premier Protein shakes when she is unable to eat a meal d/t lack of hunger. Eats a variety of protein sources and typically cooks in oil rather than butter. Occasionally eats fried foods. Has been trying to cut down on sodium intake d/t CHF. RD provided salt-free seasoning choices. Patient did not realize that juice contained carbohydrates/ calories and agreed to cut back on juice consumption. Discussed healthy snack swaps/ ideas and lower fat food choices.  Physical activity: none  Dietary Intake:  Usual eating pattern includes 3 meals and 2 snacks per day. Dining out frequency: 0-1 meals per week.  Breakfast: toast, english muffin Snack: mini doughnuts, cookies Lunch: sandwich with white breat,  pimento cheese, meat, fruit, soup (tomato, chicken noodle, potato) Snack: cookies or chips, fruit cups, pudding Supper: tuna salad (boiled eggs, mayo), chicken, Ensure Snack: n/a Beverages: water, juice, soda (diet and regular), crystal light tea+lemonaid  Nutrition Care Education: Topics covered: weight loss, carb counting, portion sizes Basic nutrition: basic food groups, appropriate nutrient balance, appropriate meal and snack schedule, general nutrition guidelines    Weight control: benefits of weight control, behavioral changes for weight loss Advanced nutrition: cooking techniques, food label reading Diabetes: appropriate meal and snack schedule, Carb counting, appropriate carb intake and balance Other lifestyle changes:  benefits of making changes, readiness for change, identifying habits that need to change  Nutritional Diagnosis:  NB-1.1 Food and nutrition-related knowledge deficit As related to food guidelines for diabetes.  As evidenced by high sugar/ carb meal and snack choices. [place]-3.3 Overweight/obesity As related to lack of activity and dietary habits.  As evidenced by BMI of 42.9.  Intervention: Discussion as noted above. Nutrition education materials provided.  Education Materials given:  . General diet guidelines for Diabetes . Food lists/ Planning A Balanced Meal . Goals/ instructions  Learner/ who was taught:  . Patient  . Caregivers  Level of understanding: Verbalizes/ demonstrates competency  Demonstrated degree of understanding via:   Teach back Learning barriers: . None  Willingness to learn/ readiness for change: . Acceptance, ready for change  Monitoring and Evaluation:  Dietary intake, fluid consumption, and body weight      follow up: prn

## 2016-04-13 NOTE — Patient Instructions (Addendum)
-  Try white-wheat bread products rather than white at least half the time  - Substitute Juice for Crystal light packets to cut back on daily calories. Remember, juice counts as a carb serving! (15g=1 carb serving)  -Try to reduce salt intake by experimenting cooking with dry seasonings  -Reduce high-fat meal and snack options ([place] bacon rather than regular bacon, lower fat cheeses and other dairy products)  -Continue to utilize Glucerna or Premier Protein shakes when appetite is low

## 2016-04-25 ENCOUNTER — Ambulatory Visit (INDEPENDENT_AMBULATORY_CARE_PROVIDER_SITE_OTHER): Payer: Medicare Other | Admitting: Family

## 2016-04-25 ENCOUNTER — Telehealth: Payer: Self-pay

## 2016-04-25 ENCOUNTER — Encounter: Payer: Self-pay | Admitting: Family

## 2016-04-25 VITALS — BP 114/64 | HR 85 | Temp 97.9°F | Ht 62.0 in | Wt 243.4 lb

## 2016-04-25 DIAGNOSIS — I5032 Chronic diastolic (congestive) heart failure: Secondary | ICD-10-CM | POA: Diagnosis not present

## 2016-04-25 DIAGNOSIS — R300 Dysuria: Secondary | ICD-10-CM

## 2016-04-25 DIAGNOSIS — L138 Other specified bullous disorders: Secondary | ICD-10-CM

## 2016-04-25 LAB — COMPREHENSIVE METABOLIC PANEL
ALT: 26 U/L (ref 0–35)
AST: 80 U/L — ABNORMAL HIGH (ref 0–37)
Albumin: 2.8 g/dL — ABNORMAL LOW (ref 3.5–5.2)
Alkaline Phosphatase: 190 U/L — ABNORMAL HIGH (ref 39–117)
BILIRUBIN TOTAL: 1.9 mg/dL — AB (ref 0.2–1.2)
BUN: 8 mg/dL (ref 6–23)
CHLORIDE: 92 meq/L — AB (ref 96–112)
CO2: 33 meq/L — AB (ref 19–32)
Calcium: 8.6 mg/dL (ref 8.4–10.5)
Creatinine, Ser: 0.76 mg/dL (ref 0.40–1.20)
GFR: 79.91 mL/min (ref >60.00)
GLUCOSE: 247 mg/dL — AB (ref 70–99)
POTASSIUM: 3.9 meq/L (ref 3.5–5.1)
Sodium: 133 mEq/L — ABNORMAL LOW (ref 135–145)
Total Protein: 6.1 g/dL (ref 6.0–8.3)

## 2016-04-25 LAB — POCT URINALYSIS DIPSTICK
Bilirubin, UA: NEGATIVE
Glucose, UA: NEGATIVE
KETONES UA: NEGATIVE
Leukocytes, UA: NEGATIVE
Nitrite, UA: NEGATIVE
Protein, UA: NEGATIVE
RBC UA: NEGATIVE
SPEC GRAV UA: 1.015
Urobilinogen, UA: 0.2
pH, UA: 7.5

## 2016-04-25 MED ORDER — SILVASORB EX GEL: 1.0000 "application " | Freq: Two times a day (BID) | CUTANEOUS | 3 refills | Status: DC | PRN

## 2016-04-25 NOTE — Patient Instructions (Signed)
Will await culture urine  Start azo

## 2016-04-25 NOTE — Progress Notes (Addendum)
Subjective:    Patient ID: [name], female    DOB: 1945-10-17, 71 y.o.   MRN: [id number]  CC: [name] is a 71 y.o. female who presents today for an acute visit.    HPI: CC: burning for one week, unchanged. No fever, hematuria.   Drinking lots of water.  Some nausea which has been going on for months.  Weight increased, at home 241. No increased SOB. Le swelling at baseline. Responds to lasix. Taking k dur.   Established with Dr. Gabriel Carina for DM. Dropped off blood sugar readings 2 days ago.   Rash improved; requesting refill for silvadene cream.        HISTORY:  Past Medical History:  Diagnosis Date  . Anemia   . Asthma   . CHF (congestive heart failure) ([place])   . CVA (cerebral vascular accident) ([place])   . Diabetes mellitus without complication ([place])   . GERD (gastroesophageal reflux disease)   . Heart murmur   . History of hiatal hernia   . Hypertension   . Panic attacks   . Peripheral vascular disease ([place])   . Restless leg syndrome   . Shortness of breath dyspnea    Past Surgical History:  Procedure Laterality Date  . ABDOMINAL HYSTERECTOMY    . BREAST BIOPSY Right yrs ago   benign  . ENDARTERECTOMY Right 07/24/2014   Procedure: ENDARTERECTOMY CAROTID;  Surgeon: Algernon Huxley, MD;  Location: [facility] ORS;  Service: Vascular;  Laterality: Right;  . EYE SURGERY Left    cataract  . FRACTURE SURGERY Left    fractured ankle  . TONSILLECTOMY     Family History  Problem Relation Age of Onset  . Pancreatic cancer Mother   . CAD Father   . Hypertension Father   . Pancreatic cancer Father   . Colon cancer Father   . CAD Brother   . Heart disease Brother     Heart attack  . Breast cancer Maternal Aunt     pt states several maternal aunts    Allergies: Codeine; Liraglutide; and Wheat bran Current Outpatient Prescriptions on File Prior to Visit  Medication Sig Dispense Refill  . acetaminophen (TYLENOL) 325 MG tablet Take 2 tablets (650 mg total) by  mouth every 6 (six) hours as needed for mild pain (or Fever >/= 101).    Marland Kitchen albuterol (PROVENTIL) (2.5 MG/3ML) 0.083% nebulizer solution Take 3 mLs (2.5 mg total) by nebulization every 4 (four) hours as needed for wheezing. 75 mL 12  . aspirin EC 81 MG tablet Take 81 mg by mouth daily.    . Cholecalciferol (VITAMIN D3) 2000 units capsule Take by mouth.    . Cyanocobalamin (B-12) 1000 MCG/ML KIT Inject 1,000 mcg as directed every 30 (thirty) days.    . dapsone 25 MG tablet Take 50 mg by mouth daily.    . ferrous sulfate 325 (65 FE) MG tablet Take 1 tablet (325 mg total) by mouth daily with breakfast. 30 tablet 3  . furosemide (LASIX) 40 MG tablet   0  . gabapentin (NEURONTIN) 800 MG tablet Take 800 mg by mouth 2 (two) times daily.    . hydrochlorothiazide (HYDRODIURIL) 12.5 MG tablet Take 12.5 mg by mouth 2 (two) times daily.     . insulin glargine (LANTUS) 100 UNIT/ML injection Inject 0.15 mLs (15 Units total) into the skin at bedtime. 10 mL 3  . insulin lispro (HUMALOG KWIKPEN) 100 UNIT/ML KiwkPen Inject 0.09 mLs (9 Units total) into  the skin 3 (three) times daily with meals. HOLD if pre meal blood sugar less than 150. 5 pen 1  . lovastatin (MEVACOR) 20 MG tablet Take 20 mg by mouth at bedtime.    Marland Kitchen omeprazole (PRILOSEC) 20 MG capsule Take 1 capsule (20 mg total) by mouth at bedtime. 90 capsule 3  . potassium chloride (K-DUR) 10 MEQ tablet Take 1 tablet (10 mEq total) by mouth daily. 15 tablet 2  . traMADol (ULTRAM) 50 MG tablet Take 1 tablet (50 mg total) by mouth daily as needed. 30 tablet 0   No current facility-administered medications on file prior to visit.     Social History  Substance Use Topics  . Smoking status: Never Smoker  . Smokeless tobacco: Never Used  . Alcohol use No    Review of Systems  Constitutional: Negative for chills and fever.  Respiratory: Negative for cough, shortness of breath and wheezing.   Cardiovascular: Positive for leg swelling (chronic). Negative for  chest pain and palpitations.  Gastrointestinal: Negative for nausea and vomiting.  Genitourinary: Positive for dysuria and frequency.  Skin: Positive for rash (chronic).      Objective:    BP 114/64 (BP Location: Left Arm, Patient Position: Sitting, Cuff Size: Large)   Pulse 85   Temp 97.9 F (36.6 C) (Oral)   Ht 5' 2"  (1.575 m)   Wt 243 lb 6.4 oz (110.4 kg)   SpO2 92%   BMI 44.52 kg/m   Wt Readings from Last 3 Encounters:  04/25/16 243 lb 6.4 oz (110.4 kg)  04/13/16 235 lb (106.6 kg)  03/15/16 231 lb (104.8 kg)    Physical Exam  Constitutional: She appears well-developed and well-nourished.  Eyes: Conjunctivae are normal.  Cardiovascular: Normal rate, regular rhythm and normal pulses.   Murmur heard.  Systolic murmur is present with a grade of 2/6  SEM II/VI, Loudest LSB, non radiating, no thrill  +1 bilateral pitting edema  Pulmonary/Chest: Effort normal and breath sounds normal. She has no wheezes. She has no rhonchi. She has no rales.  Abdominal: There is no CVA tenderness.  Neurological: She is alert.  Skin: Skin is warm and dry.  Psychiatric: She has a normal mood and affect. Her speech is normal and behavior is normal. Thought content normal.  Vitals reviewed.      Assessment & Plan:  1. Dysuria UA positive for leukocytes, glucose. Pending culture for infection. If not infection, suspect also related to uncontrolled DM.  Reemphasized patient maintaining close follow up with Dr Gabriel Carina - POCT Urinalysis Dipstick - Urine Culture  2. Linear IgA bullous dermatosis Pleased to hear has improved. Continues to follow with derm. Refilled as requested.  - Wound Dressings (SILVASORB) GEL; Apply 1 application topically 2 (two) times daily as needed.  Dispense: 1 Tube; Refill: 3  3. CHF Weight increased. No adventious lung sounds. No increased SOB. Patient will call heart failure clinic as in the past they have doubled lasix. I advised that is likely what she should do  however would advise her to inform the clinic prior to doing so.  Continues have chronic LE swelling, today at baseline. Continues to take K DUr and Lasix. Pending CMP.   I have changed Ms. Shidler's SILVASORB. I am also having her maintain her hydrochlorothiazide, lovastatin, aspirin EC, gabapentin, ferrous sulfate, acetaminophen, albuterol, B-12, dapsone, insulin glargine, insulin lispro, furosemide, omeprazole, potassium chloride, traMADol, and Vitamin D3.   Meds ordered this encounter  Medications  . DISCONTD: Wound Dressings (SILVASORB)  GEL    Sig: Apply topically.  . Wound Dressings (SILVASORB) GEL    Sig: Apply 1 application topically 2 (two) times daily as needed.    Dispense:  1 Tube    Refill:  3    Order Specific Question:   Supervising Provider    Answer:   Crecencio Mc [[id number]]    Return precautions given.   Risks, benefits, and alternatives of the medications and treatment plan prescribed today were discussed, and patient expressed understanding.   Education regarding symptom management and diagnosis given to patient on AVS.  Continue to follow with Mable Paris, FNP for routine health maintenance.   Rosser and I agreed with plan.   Mable Paris, FNP

## 2016-04-25 NOTE — Telephone Encounter (Signed)
Pt contacted the office states that she has had a 7 lb weight gain in the past week and would like to know if she can increase her Lasix for a short time to get the weight down. She denies any shortness of breath, and chest pain. She does note edema ion her feet and ankles as well as some tightness in her abdomen.

## 2016-04-25 NOTE — Telephone Encounter (Signed)
Patient called to say that she's had a 7 pound weight gain over the last week. She saw her PCP earlier today who told her to call us. She denies any shortness of breath or chest pain. Slight edema in her ankles and some tightness in her abdomen.  Patient says that she's currently taking furosemide 40mg  once daily along with potassium BID. Advised patient that we will call into her pharmacy furosemide 40mg  once daily as needed for weight gain, edema in addition to her normal dose. When she takes the additional furosemide, she also needs to take additional potassium tablet. 7 tablets of each were called in with no refills.   She currently has an appointment scheduled with 05/02/16 and will do labs at that time. Patient verbalized understanding of instructions.

## 2016-04-25 NOTE — Progress Notes (Signed)
Pre visit review using our clinic review tool, if applicable. No additional management support is needed unless otherwise documented below in the visit note. 

## 2016-04-26 ENCOUNTER — Other Ambulatory Visit: Payer: Self-pay | Admitting: Family

## 2016-04-26 ENCOUNTER — Telehealth: Payer: Self-pay | Admitting: Family

## 2016-04-26 DIAGNOSIS — I5032 Chronic diastolic (congestive) heart failure: Secondary | ICD-10-CM

## 2016-04-26 DIAGNOSIS — R531 Weakness: Secondary | ICD-10-CM

## 2016-04-26 NOTE — Telephone Encounter (Signed)
Pt called and stated that she would like a new heavy duty walker. She would like [place] to fax to [facility]. Please advise, thank you!  Call pt @ [phone]

## 2016-04-26 NOTE — Telephone Encounter (Signed)
Please advise 

## 2016-04-26 NOTE — Telephone Encounter (Signed)
Please call and ensure patient called heart failure clinic about weight gain-   She may need to increase her lasix for her day however I wanted her to discuss with heart failure clinic as well  Please keep me in the loop

## 2016-04-26 NOTE — Telephone Encounter (Signed)
Patient has been told to take two pills a day. Also she was informed to let Rennie Plowman, FNP know of any changes they do.

## 2016-04-27 LAB — URINE CULTURE

## 2016-04-28 ENCOUNTER — Telehealth: Payer: Self-pay | Admitting: *Deleted

## 2016-04-28 DIAGNOSIS — Z1239 Encounter for other screening for malignant neoplasm of breast: Secondary | ICD-10-CM

## 2016-04-28 NOTE — Telephone Encounter (Signed)
Pt requested mammogram orders  Pt contact [phone]

## 2016-04-28 NOTE — Telephone Encounter (Signed)
Please advise 

## 2016-05-02 ENCOUNTER — Encounter: Payer: Self-pay | Admitting: Family

## 2016-05-02 ENCOUNTER — Ambulatory Visit: Payer: Medicare Other | Attending: Family | Admitting: Family

## 2016-05-02 VITALS — BP 125/54 | HR 93 | Resp 18 | Ht 62.0 in | Wt 242.4 lb

## 2016-05-02 DIAGNOSIS — G2581 Restless legs syndrome: Secondary | ICD-10-CM | POA: Insufficient documentation

## 2016-05-02 DIAGNOSIS — Z888 Allergy status to other drugs, medicaments and biological substances status: Secondary | ICD-10-CM | POA: Insufficient documentation

## 2016-05-02 DIAGNOSIS — Z8673 Personal history of transient ischemic attack (TIA), and cerebral infarction without residual deficits: Secondary | ICD-10-CM | POA: Insufficient documentation

## 2016-05-02 DIAGNOSIS — K219 Gastro-esophageal reflux disease without esophagitis: Secondary | ICD-10-CM | POA: Diagnosis not present

## 2016-05-02 DIAGNOSIS — Z800 Family history of malignant neoplasm of digestive organs: Secondary | ICD-10-CM | POA: Diagnosis not present

## 2016-05-02 DIAGNOSIS — I10 Essential (primary) hypertension: Secondary | ICD-10-CM

## 2016-05-02 DIAGNOSIS — I5032 Chronic diastolic (congestive) heart failure: Secondary | ICD-10-CM | POA: Diagnosis present

## 2016-05-02 DIAGNOSIS — I110 Hypertensive heart disease with heart failure: Secondary | ICD-10-CM | POA: Diagnosis not present

## 2016-05-02 DIAGNOSIS — E1151 Type 2 diabetes mellitus with diabetic peripheral angiopathy without gangrene: Secondary | ICD-10-CM | POA: Insufficient documentation

## 2016-05-02 DIAGNOSIS — E119 Type 2 diabetes mellitus without complications: Secondary | ICD-10-CM

## 2016-05-02 DIAGNOSIS — Z7982 Long term (current) use of aspirin: Secondary | ICD-10-CM | POA: Insufficient documentation

## 2016-05-02 DIAGNOSIS — Z794 Long term (current) use of insulin: Secondary | ICD-10-CM | POA: Diagnosis not present

## 2016-05-02 DIAGNOSIS — Z79899 Other long term (current) drug therapy: Secondary | ICD-10-CM | POA: Diagnosis not present

## 2016-05-02 DIAGNOSIS — J45909 Unspecified asthma, uncomplicated: Secondary | ICD-10-CM | POA: Insufficient documentation

## 2016-05-02 DIAGNOSIS — Z885 Allergy status to narcotic agent status: Secondary | ICD-10-CM | POA: Diagnosis not present

## 2016-05-02 DIAGNOSIS — Z8249 Family history of ischemic heart disease and other diseases of the circulatory system: Secondary | ICD-10-CM | POA: Insufficient documentation

## 2016-05-02 DIAGNOSIS — R296 Repeated falls: Secondary | ICD-10-CM | POA: Diagnosis not present

## 2016-05-02 LAB — BASIC METABOLIC PANEL
Anion gap: 7 (ref 5–15)
BUN: 11 mg/dL (ref 6–20)
CALCIUM: 9.1 mg/dL (ref 8.9–10.3)
CO2: 35 mmol/L — AB (ref 22–32)
CREATININE: 0.82 mg/dL (ref 0.44–1.00)
Chloride: 93 mmol/L — ABNORMAL LOW (ref 101–111)
GFR calc Af Amer: >60 mL/min (ref >60)
GFR calc non Af Amer: >60 mL/min (ref >60)
Glucose, Bld: 219 mg/dL — ABNORMAL HIGH (ref 65–99)
Potassium: 4.3 mmol/L (ref 3.5–5.1)
Sodium: 135 mmol/L (ref 135–145)

## 2016-05-02 MED ORDER — FUROSEMIDE 40 MG PO TABS: 40.0000 mg | ORAL_TABLET | Freq: Two times a day (BID) | ORAL | 5 refills | Status: DC

## 2016-05-02 NOTE — Progress Notes (Signed)
Patient ID: [name], female    DOB: 10/08/1945, 71 y.o.   MRN: [id number]  HPI  [name] is a 71 y/o female with a history of restless leg syndrome, PVD, panic attacks, HTN, hiatal hernia, GERD, DM, CVA, asthma, anemia and chronic heart failure. No tobacco exposure.  Last echo was done 12/07/15 which showed an EF of 50% with mild MR, AR and TR.  Was in the ED on 03/03/16 with diarrhea. Treated and released. Was in the ED on 01/29/16 after a mechanical fall. Discharged from the ED. Was last admitted on 12/10/15 with HF exacerbation due to indiscretion of sodium intake and fluid intake. Was diuresed with IV diuretics and had cardiology consult done while admitted. Was discharged to [facility] for PT. Had been in the ER on 11/15/15 due to dizziness and mechanical fall. Had another fall prior on 10/29/15 which resulted in an ER visit.  She presents today for her follow-up visit with fatigue and shortness of breath with little exertion. She does notice increased swelling around her ankles with the left being worse than the right. Unsure of her medications as they are bubble packaged. Continue to experience weakness in both of her legs and her back. She feels very unstable standing for very long. Does use her walker at home but even that requires assistance. Her walker won't fit in the doorway of her bathroom and she's trying to either get hers fixed or get a new one. Is wondering if she may need oxygen at bedtime.   Past Medical History:  Diagnosis Date  . Anemia   . Asthma   . CHF (congestive heart failure) ([place])   . CVA (cerebral vascular accident) ([place])   . Diabetes mellitus without complication ([place])   . GERD (gastroesophageal reflux disease)   . Heart murmur   . History of hiatal hernia   . Hypertension   . Panic attacks   . Peripheral vascular disease ([place])   . Restless leg syndrome   . Shortness of breath dyspnea    Past Surgical History:  Procedure Laterality Date  .  ABDOMINAL HYSTERECTOMY    . BREAST BIOPSY Right yrs ago   benign  . ENDARTERECTOMY Right 07/24/2014   Procedure: ENDARTERECTOMY CAROTID;  Surgeon: Algernon Huxley, MD;  Location: [facility] ORS;  Service: Vascular;  Laterality: Right;  . EYE SURGERY Left    cataract  . FRACTURE SURGERY Left    fractured ankle  . TONSILLECTOMY     Family History  Problem Relation Age of Onset  . Pancreatic cancer Mother   . CAD Father   . Hypertension Father   . Pancreatic cancer Father   . Colon cancer Father   . CAD Brother   . Heart disease Brother     Heart attack  . Breast cancer Maternal Aunt     pt states several maternal aunts   Social History  Substance Use Topics  . Smoking status: Never Smoker  . Smokeless tobacco: Never Used  . Alcohol use No   Allergies  Allergen Reactions  . Codeine Other (See Comments)    Reaction:  Dizziness   . Liraglutide     Other reaction(s): nausea and stomach pain  . Wheat Bran Other (See Comments)    Reaction:  Sneezing    Prior to Admission medications   Medication Sig Start Date End Date Taking? Authorizing Provider  acetaminophen (TYLENOL) 325 MG tablet Take 2 tablets (650 mg total) by mouth every 6 (  six) hours as needed for mild pain (or Fever >/= 101). 12/14/15  Yes Nicholes Mango, MD  albuterol (PROVENTIL) (2.5 MG/3ML) 0.083% nebulizer solution Take 3 mLs (2.5 mg total) by nebulization every 4 (four) hours as needed for wheezing. 12/14/15  Yes Nicholes Mango, MD  aspirin EC 81 MG tablet Take 81 mg by mouth daily.   Yes Historical Provider, MD  Cholecalciferol (VITAMIN D3) 2000 units capsule Take by mouth.   Yes Historical Provider, MD  Cyanocobalamin (B-12) 1000 MCG/ML KIT Inject 1,000 mcg as directed every 30 (thirty) days. 02/02/16  Yes Historical Provider, MD  dapsone 25 MG tablet Take 150 mg by mouth daily. 1.5 tablets daily   Yes Historical Provider, MD  fluconazole (DIFLUCAN) 200 MG tablet Take 200 mg by mouth once a week. 04/21/16 05/12/16 Yes Historical  Provider, MD  folic acid (FOLVITE) 1 MG tablet Take 1 mg by mouth daily. Take one pill daily on days that not taking methotrexate   Yes Historical Provider, MD         hydrochlorothiazide (HYDRODIURIL) 12.5 MG tablet Take 12.5 mg by mouth 2 (two) times daily.    Yes Historical Provider, MD  insulin glargine (LANTUS) 100 UNIT/ML injection Inject 0.15 mLs (15 Units total) into the skin at bedtime. 02/08/16  Yes Burnard Hawthorne, FNP  insulin lispro (HUMALOG KWIKPEN) 100 UNIT/ML KiwkPen Inject 0.09 mLs (9 Units total) into the skin 3 (three) times daily with meals. HOLD if pre meal blood sugar less than 150. 03/07/16  Yes Burnard Hawthorne, FNP  methotrexate (RHEUMATREX) 2.5 MG tablet Take 10 mg by mouth once a week. Caution:Chemotherapy. Protect from light.  Take 4 tablets by mouth weekly   Yes Historical Provider, MD  potassium chloride (K-DUR) 10 MEQ tablet Take 1 tablet (10 mEq total) by mouth daily. Patient taking differently: Take 40 mEq by mouth 2 (two) times daily.  03/25/16  Yes Burnard Hawthorne, FNP  predniSONE (DELTASONE) 10 MG tablet Take 30 mg by mouth daily with breakfast.   Yes Historical Provider, MD  ferrous sulfate 325 (65 FE) MG tablet Take 1 tablet (325 mg total) by mouth daily with breakfast. 11/03/15   Burnard Hawthorne, FNP  gabapentin (NEURONTIN) 800 MG tablet Take 800 mg by mouth 2 (two) times daily.    Historical Provider, MD  lovastatin (MEVACOR) 20 MG tablet Take 20 mg by mouth at bedtime.    Historical Provider, MD  omeprazole (PRILOSEC) 20 MG capsule Take 1 capsule (20 mg total) by mouth at bedtime. 03/09/16   Burnard Hawthorne, FNP  traMADol (ULTRAM) 50 MG tablet Take 1 tablet (50 mg total) by mouth daily as needed. 04/11/16   Burnard Hawthorne, FNP  Wound Dressings (SILVASORB) GEL Apply 1 application topically 2 (two) times daily as needed. 04/25/16   Burnard Hawthorne, FNP    Review of Systems  Constitutional: Positive for fatigue. Negative for appetite change.  HENT:  Negative for congestion, postnasal drip and sore throat.   Eyes: Negative.   Respiratory: Positive for shortness of breath. Negative for cough and chest tightness.   Cardiovascular: Positive for leg swelling. Negative for chest pain and palpitations.  Gastrointestinal: Negative for abdominal distention and abdominal pain.  Endocrine: Negative.   Genitourinary: Negative.   Musculoskeletal: Positive for back pain (lower back). Negative for neck pain.  Skin: Negative.   Allergic/Immunologic: Negative.   Neurological: Positive for weakness (in legs) and light-headedness. Negative for dizziness.  Hematological: Negative for adenopathy. Does not  bruise/bleed easily.  Psychiatric/Behavioral: Negative for dysphoric mood and sleep disturbance (trouble falling asleep; sleeping on 2 pillows). The patient is not nervous/anxious.    Vitals:   05/02/16 1106  BP: (!) 125/54  Pulse: 93  Resp: 18  SpO2: 96%  Weight: 242 lb 6 oz (109.9 kg)  Height: 5' 2" (1.575 m)   Wt Readings from Last 3 Encounters:  05/02/16 242 lb 6 oz (109.9 kg)  04/25/16 243 lb 6.4 oz (110.4 kg)  04/13/16 235 lb (106.6 kg)   Lab Results  Component Value Date   CREATININE 0.82 05/02/2016   CREATININE 0.76 04/25/2016   CREATININE 1.01 03/11/2016   Physical Exam  Constitutional: She is oriented to person, place, and time. She appears well-developed and well-nourished.  HENT:  Head: Normocephalic and atraumatic.  Eyes: Conjunctivae are normal. Pupils are equal, round, and reactive to light.  Neck: Normal range of motion. Neck supple. No JVD present.  Cardiovascular: Normal rate and regular rhythm.   Pulmonary/Chest: Effort normal. She has no wheezes. She has no rales.  Abdominal: Soft. She exhibits no distension. There is no tenderness.  Musculoskeletal: She exhibits edema (1+ pitting edema in bilateral lower legs with L>R). She exhibits no tenderness.  Neurological: She is alert and oriented to person, place, and time.   Skin: Skin is warm and dry.  Psychiatric: She has a normal mood and affect. Her behavior is normal.  Nursing note and vitals reviewed.     Assessment & Plan:  1: Chronic heart failure with preserved ejection fraction- - NYHA class III - mildly fluid overloaded today with edema today - called pharmacy to get medication list and furosemide is now as needed. It was supposed to be 74m daily and an additional 433mas needed. Prescription sent in for furosemide 4037mwice daily - will check a BMP today - continues to weigh at home but with assistance. Call for an overnight weight gain of >2 pounds or a weekly weight gain of >5 pounds. Says that her home weight has ranged from 235-240 pounds. - not using salt. Using pepper and salt substitute.  - drinking about 30-40 ounces of fluid daily - received her flu vaccine for this season already - advised to use naproxen sparingly - is wondering if she would qualify for home oxygen so she was encouraged to discuss this with her PCP  2: HTN- - BP looks great today - continue medications  3: Diabetes- - glucose this morning was 101 - admits to eating more sweet foods - saw PCP (Arnett) 04/25/16  4: Recurrent falls- - living at home (was at [facility] around the [facility] caregivers - no longer receiving PT and feels like the legs/back are becoming more weak where she's falling more often and becoming less active - encouraged her to speak with PCP today about resuming PT - came into the office in a wheelchair as she says that her legs are quite weak  Patient did not bring her medications nor a list. Each medication was verbally reviewed with the patient and she was encouraged to bring the bottles to every visit to confirm accuracy of list.  Return in 1 month or sooner for any questions/problems before then.

## 2016-05-02 NOTE — Patient Instructions (Signed)
Continue weighing daily and call for an overnight weight gain of > 2 pounds or a weekly weight gain of >5 pounds. 

## 2016-05-02 NOTE — Telephone Encounter (Signed)
Call pt-   Order placed She may schedule  We placed a referral. Mammogram this year. I asked that you call one the below locations and schedule this when it is convenient for you.   If you have dense breasts, you may ask for 3D mammogram over the traditional 2D mammogram as new evidence suggest 3D is superior. Please note that NOT all insurance companies cover 3D and you may have to pay a higher copay. You may call your insurance company to further clarify your benefits.   Options for Mammogram.    [facility]  [address]  [place], [place]  [phone]  * [facility] [address] [place], [place] [phone] * Note if you ask for 3D mammogram at this location, you must request [place], [place] location*

## 2016-05-03 ENCOUNTER — Telehealth: Payer: Self-pay | Admitting: Family

## 2016-05-03 DIAGNOSIS — R296 Repeated falls: Secondary | ICD-10-CM

## 2016-05-03 NOTE — Telephone Encounter (Signed)
Please advise 

## 2016-05-03 NOTE — Telephone Encounter (Signed)
Pt called about wanting to get a Rx to get a sit down walker if possible from [employer]. Please advise?  Call pt @ [phone]. Thank you!

## 2016-05-03 NOTE — Telephone Encounter (Signed)
Spoke with pt and informed her that Claris Che has placed the order for the mammogram and that she would just need to call and schedule the appt. The pt stated that she uses [facility] and she would call to schedule the appt.

## 2016-05-05 NOTE — Telephone Encounter (Signed)
Pt has requested a update on her Rx for her sit down walker  Pt contact [phone]

## 2016-05-05 NOTE — Telephone Encounter (Signed)
Spoken to patient and have informed her that we have already faxed RX.

## 2016-05-13 ENCOUNTER — Other Ambulatory Visit: Payer: Self-pay | Admitting: Family

## 2016-05-16 ENCOUNTER — Telehealth: Payer: Self-pay | Admitting: Family

## 2016-05-16 ENCOUNTER — Other Ambulatory Visit: Payer: Self-pay | Admitting: Family

## 2016-05-16 DIAGNOSIS — R296 Repeated falls: Secondary | ICD-10-CM

## 2016-05-16 NOTE — Telephone Encounter (Signed)
Call patient I was reviewing notes from cardiology and agree the patient should get back in with physical therapy due to recurrent falls Referral placed; let pt know

## 2016-05-16 NOTE — Progress Notes (Unsigned)
See telephone note about falls and PT.   I accidentally closed.   Call pt regarding PT referral

## 2016-05-17 NOTE — Progress Notes (Signed)
Patient has been informed.

## 2016-05-19 ENCOUNTER — Other Ambulatory Visit: Payer: Self-pay | Admitting: Family

## 2016-05-19 DIAGNOSIS — E119 Type 2 diabetes mellitus without complications: Secondary | ICD-10-CM

## 2016-05-19 DIAGNOSIS — Z794 Long term (current) use of insulin: Principal | ICD-10-CM

## 2016-05-30 ENCOUNTER — Encounter: Payer: Self-pay | Admitting: Family

## 2016-05-30 ENCOUNTER — Encounter: Payer: Self-pay | Admitting: Emergency Medicine

## 2016-05-30 ENCOUNTER — Other Ambulatory Visit: Payer: Self-pay

## 2016-05-30 ENCOUNTER — Emergency Department: Payer: Medicare Other

## 2016-05-30 ENCOUNTER — Inpatient Hospital Stay
Admission: EM | Admit: 2016-05-30 | Discharge: 2016-06-02 | DRG: 292 | Disposition: A | Discharge status: Skilled Nursing Facility | Payer: Medicare Other | Attending: Internal Medicine | Admitting: Internal Medicine

## 2016-05-30 ENCOUNTER — Ambulatory Visit: Payer: Medicare Other | Admitting: Family

## 2016-05-30 VITALS — BP 115/48 | HR 93 | Resp 18 | Ht 62.0 in | Wt 227.0 lb

## 2016-05-30 DIAGNOSIS — Z794 Long term (current) use of insulin: Secondary | ICD-10-CM | POA: Insufficient documentation

## 2016-05-30 DIAGNOSIS — Z888 Allergy status to other drugs, medicaments and biological substances status: Secondary | ICD-10-CM

## 2016-05-30 DIAGNOSIS — I5032 Chronic diastolic (congestive) heart failure: Secondary | ICD-10-CM | POA: Insufficient documentation

## 2016-05-30 DIAGNOSIS — Z803 Family history of malignant neoplasm of breast: Secondary | ICD-10-CM | POA: Diagnosis not present

## 2016-05-30 DIAGNOSIS — Z6841 Body Mass Index (BMI) 40.0 and over, adult: Secondary | ICD-10-CM | POA: Diagnosis not present

## 2016-05-30 DIAGNOSIS — N390 Urinary tract infection, site not specified: Secondary | ICD-10-CM | POA: Diagnosis present

## 2016-05-30 DIAGNOSIS — I10 Essential (primary) hypertension: Secondary | ICD-10-CM

## 2016-05-30 DIAGNOSIS — R296 Repeated falls: Secondary | ICD-10-CM | POA: Insufficient documentation

## 2016-05-30 DIAGNOSIS — Z800 Family history of malignant neoplasm of digestive organs: Secondary | ICD-10-CM

## 2016-05-30 DIAGNOSIS — Z79899 Other long term (current) drug therapy: Secondary | ICD-10-CM

## 2016-05-30 DIAGNOSIS — I110 Hypertensive heart disease with heart failure: Secondary | ICD-10-CM

## 2016-05-30 DIAGNOSIS — E1151 Type 2 diabetes mellitus with diabetic peripheral angiopathy without gangrene: Secondary | ICD-10-CM | POA: Diagnosis present

## 2016-05-30 DIAGNOSIS — Z8249 Family history of ischemic heart disease and other diseases of the circulatory system: Secondary | ICD-10-CM

## 2016-05-30 DIAGNOSIS — G2581 Restless legs syndrome: Secondary | ICD-10-CM | POA: Insufficient documentation

## 2016-05-30 DIAGNOSIS — R0902 Hypoxemia: Secondary | ICD-10-CM | POA: Diagnosis present

## 2016-05-30 DIAGNOSIS — Z91018 Allergy to other foods: Secondary | ICD-10-CM

## 2016-05-30 DIAGNOSIS — E876 Hypokalemia: Secondary | ICD-10-CM | POA: Diagnosis present

## 2016-05-30 DIAGNOSIS — Z8673 Personal history of transient ischemic attack (TIA), and cerebral infarction without residual deficits: Secondary | ICD-10-CM

## 2016-05-30 DIAGNOSIS — R531 Weakness: Secondary | ICD-10-CM

## 2016-05-30 DIAGNOSIS — K219 Gastro-esophageal reflux disease without esophagitis: Secondary | ICD-10-CM

## 2016-05-30 DIAGNOSIS — E119 Type 2 diabetes mellitus without complications: Secondary | ICD-10-CM

## 2016-05-30 DIAGNOSIS — Z885 Allergy status to narcotic agent status: Secondary | ICD-10-CM | POA: Insufficient documentation

## 2016-05-30 DIAGNOSIS — I350 Nonrheumatic aortic (valve) stenosis: Secondary | ICD-10-CM | POA: Diagnosis present

## 2016-05-30 DIAGNOSIS — Z7982 Long term (current) use of aspirin: Secondary | ICD-10-CM | POA: Insufficient documentation

## 2016-05-30 DIAGNOSIS — I509 Heart failure, unspecified: Secondary | ICD-10-CM

## 2016-05-30 DIAGNOSIS — E871 Hypo-osmolality and hyponatremia: Secondary | ICD-10-CM | POA: Diagnosis present

## 2016-05-30 DIAGNOSIS — D638 Anemia in other chronic diseases classified elsewhere: Secondary | ICD-10-CM | POA: Diagnosis present

## 2016-05-30 DIAGNOSIS — J45909 Unspecified asthma, uncomplicated: Secondary | ICD-10-CM | POA: Insufficient documentation

## 2016-05-30 DIAGNOSIS — I5033 Acute on chronic diastolic (congestive) heart failure: Secondary | ICD-10-CM | POA: Diagnosis present

## 2016-05-30 DIAGNOSIS — Z7952 Long term (current) use of systemic steroids: Secondary | ICD-10-CM | POA: Diagnosis not present

## 2016-05-30 LAB — URINALYSIS, COMPLETE (UACMP) WITH MICROSCOPIC
BILIRUBIN URINE: NEGATIVE
HGB URINE DIPSTICK: NEGATIVE
Ketones, ur: NEGATIVE mg/dL
Leukocytes, UA: NEGATIVE
NITRITE: POSITIVE — AB
Protein, ur: NEGATIVE mg/dL
RBC / HPF: NONE SEEN RBC/hpf (ref 0–5)
SPECIFIC GRAVITY, URINE: 1.024 (ref 1.005–1.030)
Squamous Epithelial / LPF: NONE SEEN
pH: 6 (ref 5.0–8.0)

## 2016-05-30 LAB — COMPREHENSIVE METABOLIC PANEL
ALK PHOS: 147 U/L — AB (ref 38–126)
ALT: 32 U/L (ref 14–54)
ANION GAP: 11 (ref 5–15)
AST: 44 U/L — AB (ref 15–41)
Albumin: 2.9 g/dL — ABNORMAL LOW (ref 3.5–5.0)
BILIRUBIN TOTAL: 2.5 mg/dL — AB (ref 0.3–1.2)
BUN: 12 mg/dL (ref 6–20)
CALCIUM: 8.4 mg/dL — AB (ref 8.9–10.3)
CO2: 38 mmol/L — ABNORMAL HIGH (ref 22–32)
Chloride: 83 mmol/L — ABNORMAL LOW (ref 101–111)
Creatinine, Ser: 1.01 mg/dL — ABNORMAL HIGH (ref 0.44–1.00)
GFR calc Af Amer: >60 mL/min (ref >60)
GFR, EST NON AFRICAN AMERICAN: 55 mL/min — AB (ref >60)
Glucose, Bld: 397 mg/dL — ABNORMAL HIGH (ref 65–99)
POTASSIUM: 2.6 mmol/L — AB (ref 3.5–5.1)
Sodium: 132 mmol/L — ABNORMAL LOW (ref 135–145)
TOTAL PROTEIN: 5.9 g/dL — AB (ref 6.5–8.1)

## 2016-05-30 LAB — CBC WITH DIFFERENTIAL/PLATELET
BAND NEUTROPHILS: 2 %
BASOS ABS: 0 10*3/uL (ref 0–0.1)
Basophils Relative: 0 %
Blasts: 0 %
EOS ABS: 0 10*3/uL (ref 0–0.7)
EOS PCT: 0 %
HCT: 28.6 % — ABNORMAL LOW (ref 35.0–47.0)
Hemoglobin: 9.5 g/dL — ABNORMAL LOW (ref 12.0–16.0)
LYMPHS ABS: 1.1 10*3/uL (ref 1.0–3.6)
Lymphocytes Relative: 12 %
MCH: 32.9 pg (ref 26.0–34.0)
MCHC: 33 g/dL (ref 32.0–36.0)
MCV: 99.4 fL (ref 80.0–100.0)
METAMYELOCYTES PCT: 1 %
MONO ABS: 0.2 10*3/uL (ref 0.2–0.9)
MONOS PCT: 2 %
MYELOCYTES: 1 %
NEUTROS ABS: 8.2 10*3/uL — AB (ref 1.4–6.5)
Neutrophils Relative %: 82 %
Other: 0 %
Platelets: 152 10*3/uL (ref 150–440)
Promyelocytes Absolute: 0 %
RBC: 2.88 MIL/uL — ABNORMAL LOW (ref 3.80–5.20)
RDW: 16.9 % — AB (ref 11.5–14.5)
WBC: 9.5 10*3/uL (ref 3.6–11.0)
nRBC: 1 /100 WBC — ABNORMAL HIGH

## 2016-05-30 LAB — GLUCOSE, CAPILLARY: GLUCOSE-CAPILLARY: 377 mg/dL — AB (ref 65–99)

## 2016-05-30 LAB — TROPONIN I: Troponin I: 0.03 ng/mL (ref <0.03)

## 2016-05-30 MED ORDER — INSULIN ASPART 100 UNIT/ML ~~LOC~~ SOLN
0.0000 [IU] | Freq: Every day | SUBCUTANEOUS | Status: DC
  Administered 2016-05-31: 2 [IU] via SUBCUTANEOUS
  Administered 2016-05-31: 4 [IU] via SUBCUTANEOUS
  Administered 2016-06-01: 3 [IU] via SUBCUTANEOUS
  Filled 2016-05-30: qty 3
  Filled 2016-05-30: qty 2
  Filled 2016-05-30: qty 4

## 2016-05-30 MED ORDER — FUROSEMIDE 10 MG/ML IJ SOLN
40.0000 mg | Freq: Once | INTRAMUSCULAR | Status: AC
  Administered 2016-05-30: 40 mg via INTRAVENOUS
  Filled 2016-05-30: qty 4

## 2016-05-30 MED ORDER — CEFTRIAXONE SODIUM-DEXTROSE 1-3.74 GM-% IV SOLR
1.0000 g | Freq: Once | INTRAVENOUS | Status: AC
  Administered 2016-05-30: 1 g via INTRAVENOUS
  Filled 2016-05-30: qty 50

## 2016-05-30 MED ORDER — DEXTROSE 5 % IV SOLN
1.0000 g | INTRAVENOUS | Status: DC
  Administered 2016-05-31 – 2016-06-01 (×2): 1 g via INTRAVENOUS
  Filled 2016-05-30 (×3): qty 10

## 2016-05-30 MED ORDER — INSULIN ASPART 100 UNIT/ML ~~LOC~~ SOLN
0.0000 [IU] | Freq: Three times a day (TID) | SUBCUTANEOUS | Status: DC
  Administered 2016-05-31 (×2): 3 [IU] via SUBCUTANEOUS
  Administered 2016-05-31: 5 [IU] via SUBCUTANEOUS
  Administered 2016-06-01 (×2): 3 [IU] via SUBCUTANEOUS
  Administered 2016-06-01: 5 [IU] via SUBCUTANEOUS
  Administered 2016-06-02 (×2): 2 [IU] via SUBCUTANEOUS
  Filled 2016-05-30: qty 3
  Filled 2016-05-30: qty 2
  Filled 2016-05-30: qty 3
  Filled 2016-05-30 (×2): qty 5
  Filled 2016-05-30: qty 3
  Filled 2016-05-30: qty 2
  Filled 2016-05-30: qty 3

## 2016-05-30 MED ORDER — DEXTROSE 5 % IV SOLN: 1.0000 g | Freq: Once | INTRAVENOUS | Status: DC

## 2016-05-30 MED ORDER — TORSEMIDE 20 MG PO TABS: 40.0000 mg | ORAL_TABLET | Freq: Two times a day (BID) | ORAL | 3 refills | Status: DC

## 2016-05-30 NOTE — ED Provider Notes (Signed)
[facility] Emergency Department Provider Note  Time seen: 7:08 PM  I have reviewed the triage vital signs and the nursing notes.   HISTORY  Chief Complaint Weakness    HPI [name] is a 70 y.o. female with a past medical history of anemia, asthma, CHF, CVA, diabetes, gastric reflux, presents to the emergency department with generalized weakness. According to the patient since yesterday evening she has felt progressively more weak, today she has been unable to stand on her own due to weakness. EMS states upon arrival the patient had a room air saturation of 88%. Patient has no home O2 requirement. Patient lives alone but has people check on her. Patient states normally she ambulates with a walker but has been unable to get out of bed today. Patient states an orange color to her urine with a foul smell but denies dysuria. Denies abdominal pain. Denies cough, congestion or fever. Denies nausea vomiting diarrhea or black or bloody stool.  Past Medical History:  Diagnosis Date  . Anemia   . Asthma   . CHF (congestive heart failure) (HCC)   . CVA (cerebral vascular accident) (HCC)   . Diabetes mellitus without complication (HCC)   . GERD (gastroesophageal reflux disease)   . Heart murmur   . History of hiatal hernia   . Hypertension   . Panic attacks   . Peripheral vascular disease (HCC)   . Restless leg syndrome   . Shortness of breath dyspnea     Patient Active Problem List   Diagnosis Date Noted  . Pain of upper abdomen 03/10/2016  . Morbid obesity (HCC) 02/02/2016  . HTN (hypertension) 01/06/2016  . Routine physical examination 12/30/2015  . Recurrent falls 12/13/2015  . Fatty liver 12/10/2015  . Fracture of multiple ribs 11/25/2015  . B12 deficiency anemia 11/03/2015  . GERD (gastroesophageal reflux disease) 10/17/2015  . Pure hypercholesterolemia 10/17/2015  . Type 2 diabetes mellitus (HCC) 10/05/2015  . Depression 10/05/2015  . 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 CHF (congestive heart failure) (HCC) 03/13/2015  . Carotid stenosis 07/24/2014    Past Surgical History:  Procedure Laterality Date  . ABDOMINAL HYSTERECTOMY    . BREAST BIOPSY Right yrs ago   benign  . ENDARTERECTOMY Right 07/24/2014   Procedure: ENDARTERECTOMY CAROTID;  Surgeon: Jason S Dew, MD;  Location: [facility] ORS;  Service: Vascular;  Laterality: Right;  . EYE SURGERY Left    cataract  . FRACTURE SURGERY Left    fractured ankle  . TONSILLECTOMY      Prior to Admission medications   Medication Sig Start Date End Date Taking? Authorizing Provider  acetaminophen (TYLENOL) 325 MG tablet Take 2 tablets (650 mg total) by mouth every 6 (six) hours as needed for mild pain (or Fever >/= 101). 12/14/15   Aruna Gouru, MD  albuterol (PROVENTIL) (2.5 MG/3ML) 0.083% nebulizer solution Take 3 mLs (2.5 mg total) by nebulization every 4 (four) hours as needed for wheezing. 12/14/15   Aruna Gouru, MD  aspirin EC 81 MG tablet Take 81 mg by mouth daily.    Historical Provider, MD  Cholecalciferol (VITAMIN D3) 2000 units capsule Take by mouth.    Historical Provider, MD  Cyanocobalamin (B-12) 1000 MCG/ML KIT Inject 1,000 mcg as directed every 30 (thirty) days. 02/02/16   Historical Provider, MD  dapsone 25 MG tablet Take 150 mg by mouth daily. 1.5 tablets daily    Historical Provider, MD  ferrous sulfate 325 (65 FE) MG tablet   Take 1 tablet (325 mg total) by mouth daily with breakfast. 11/03/15   Margaret G Arnett, FNP  folic acid (FOLVITE) 1 MG tablet Take 1 mg by mouth daily. Take one pill daily on days that not taking methotrexate    Historical Provider, MD  gabapentin (NEURONTIN) 800 MG tablet Take 800 mg by mouth 2 (two) times daily.    Historical Provider, MD  hydrochlorothiazide (HYDRODIURIL) 12.5 MG tablet Take 12.5 mg by mouth 2 (two) times daily.     Historical Provider, MD  insulin glargine (LANTUS) 100 UNIT/ML injection Inject 0.15 mLs (15 Units total) into  the skin at bedtime. 02/08/16   Margaret G Arnett, FNP  insulin lispro (HUMALOG KWIKPEN) 100 UNIT/ML KiwkPen Inject 0.09 mLs (9 Units total) into the skin 3 (three) times daily with meals. HOLD if pre meal blood sugar less than 150. 03/07/16   Margaret G Arnett, FNP  lovastatin (MEVACOR) 20 MG tablet Take 20 mg by mouth at bedtime.    Historical Provider, MD  methotrexate (RHEUMATREX) 2.5 MG tablet Take 10 mg by mouth once a week. Caution:Chemotherapy. Protect from light.  Take 4 tablets by mouth weekly    Historical Provider, MD  omeprazole (PRILOSEC) 20 MG capsule Take 1 capsule (20 mg total) by mouth at bedtime. 03/09/16   Margaret G Arnett, FNP  potassium chloride (K-DUR) 10 MEQ tablet Take 1 tablet (10 mEq total) by mouth daily. Patient taking differently: Take 40 mEq by mouth 2 (two) times daily.  03/25/16   Margaret G Arnett, FNP  predniSONE (DELTASONE) 10 MG tablet Take 30 mg by mouth daily with breakfast.    Historical Provider, MD  torsemide (DEMADEX) 20 MG tablet Take 2 tablets (40 mg total) by mouth 2 (two) times daily. 05/30/16 06/29/16  Tina A Hackney, FNP  traMADol (ULTRAM) 50 MG tablet Take 1 tablet (50 mg total) by mouth daily as needed. 04/11/16   Margaret G Arnett, FNP  Wound Dressings (SILVASORB) GEL Apply 1 application topically 2 (two) times daily as needed. 04/25/16   Margaret G Arnett, FNP    Allergies  Allergen Reactions  . Codeine Other (See Comments)    Reaction:  Dizziness   . Liraglutide     Other reaction(s): nausea and stomach pain  . Wheat Bran Other (See Comments)    Reaction:  Sneezing     Family History  Problem Relation Age of Onset  . Pancreatic cancer Mother   . CAD Father   . Hypertension Father   . Pancreatic cancer Father   . Colon cancer Father   . CAD Brother   . Heart disease Brother     Heart attack  . Breast cancer Maternal Aunt     pt states several maternal aunts    Social History Social History  Substance Use Topics  . Smoking status:  Never Smoker  . Smokeless tobacco: Never Used  . Alcohol use No    Review of Systems Constitutional: Negative for fever. Cardiovascular: Negative for chest pain. Respiratory: Negative for shortness of breath. Gastrointestinal: Negative for abdominal pain, vomiting and diarrhea. Neurological: Negative for headache 10-point ROS otherwise negative.  ____________________________________________   PHYSICAL EXAM:  VITAL SIGNS: ED Triage Vitals  Enc Vitals Group     BP 05/30/16 1859 (!) 147/74     Pulse Rate 05/30/16 1859 88     Resp 05/30/16 1859 14     Temp 05/30/16 1859 97.3 F (36.3 C)     Temp Source 05/30/16 1859 Oral       SpO2 05/30/16 1859 (!) 89 %     Weight 05/30/16 1901 227 lb (103 kg)     Height 05/30/16 1901 5' 2" (1.575 m)     Head Circumference --      Peak Flow --      Pain Score 05/30/16 1859 10     Pain Loc --      Pain Edu? --      Excl. in GC? --     Constitutional: Alert and oriented. Well appearing and in no distress. Eyes: Normal exam ENT   Head: Normocephalic and atraumatic.   Mouth/Throat: Mucous membranes are moist. Cardiovascular: Normal rate, regular rhythm. No murmur Respiratory: Normal respiratory effort without tachypnea nor retractions. Breath sounds are clear Gastrointestinal: Soft and nontender. No distention. Obese. Musculoskeletal: Moderate lower extremity edema equal bilaterally without erythema or tenderness. Neurologic:  Normal speech and language. No gross focal neurologic deficits  Skin:  Skin is warm, dry and intact.  Psychiatric: Mood and affect are normal.   ____________________________________________    EKG  EKG reviewed and interpreted by myself shows normal sinus rhythm at 85 bpm, narrow QRS, left axis deviation, prolonged QTC otherwise normal intervals, nonspecific ST changes. No ST elevation.  ____________________________________________    RADIOLOGY  Chest x-ray shows pulmonary interstitial prominence  likely interstitial edema.  ____________________________________________   INITIAL IMPRESSION / ASSESSMENT AND PLAN / ED COURSE  Pertinent labs & imaging results that were available during my care of the patient were reviewed by me and considered in my medical decision making (see chart for details).  Patient presents to the emergency department for generalized weakness and found to be hypoxic by EMS 88% on room air. Patient has no baseline O2 requirement. Patient does have increased lower extremity edema over the past 1-2 weeks per patient. We will check labs, chest x-ray, urinalysis and closely monitor in the emergency department. Given new oxygen requirement with generalized weakness highly anticipated admission to the hospital.  Patient's x-ray consistent with interstitial edema. Given the patient's hypoxia on room air with no baseline oxygen requirement we will treat with Lasix and admitted to the hospital for further evaluation and treatment. Patient's workup has also shown a urinary tract infection which is likely also contributing to the patient's generalized fatigue/weakness. We will treat with IV Rocephin and send a urine culture.  ____________________________________________   FINAL CLINICAL IMPRESSION(S) / ED DIAGNOSES  Gen weakness hypoxia CHF exacerbation UTI    , MD 05/30/16 2047  

## 2016-05-30 NOTE — ED Triage Notes (Signed)
Pt arrived via ems from home. Pt called ems for weakness that started this morning at 0800am and progressively got worsen throughout the day. EMS reports a initial O2 of 88 and pt arrived on 2L of oxygen via nasal canula. EMS reports a blood sugar of 421. Pt alert and oriented upon assessment.

## 2016-05-30 NOTE — Patient Instructions (Addendum)
Continue weighing daily and call for an overnight weight gain of > 2 pounds or a weekly weight gain of >5 pounds.  Stop furosemide (lasix) and begin torsemide 40mg  twice daily. If you respond great and your weight comes down more and the swelling in your legs get better, you can decrease it to 20mg  twice daily.

## 2016-05-30 NOTE — Progress Notes (Addendum)
Patient ID: [name], female    DOB: October 26, 1945, 71 y.o.   MRN: [id number]  HPI  [name] is a 71 y/o female with a history of restless leg syndrome, PVD, panic attacks, HTN, hiatal hernia, GERD, DM, CVA, asthma, anemia and chronic heart failure. No tobacco exposure.  Last echo was done 12/07/15 which showed an EF of 50% with mild MR, AR and TR.  Was in the ED on 03/03/16 with diarrhea. Treated and released. Was in the ED on 01/29/16 after a mechanical fall. Discharged from the ED. Was last admitted on 12/10/15 with HF exacerbation due to indiscretion of sodium intake and fluid intake. Was diuresed with IV diuretics and had cardiology consult done while admitted. Was discharged to [facility] for PT. Had been in the ER on 11/15/15 due to dizziness and mechanical fall. Had another fall prior on 10/29/15 which resulted in an ER visit.  She presents today for her follow-up visit with fatigue and shortness of breath with little exertion. Denies any symptoms at rest. Continues to have quite a bit of edema in both of her lower legs and does try to elevate them when she can. Has not taken her diuretic yet today but says that she'll do so once she returns home. Doesn't feel like the furosemide is working as well as it used to.   Past Medical History:  Diagnosis Date  . Anemia   . Asthma   . CHF (congestive heart failure) ([place])   . CVA (cerebral vascular accident) ([place])   . Diabetes mellitus without complication ([place])   . GERD (gastroesophageal reflux disease)   . Heart murmur   . History of hiatal hernia   . Hypertension   . Panic attacks   . Peripheral vascular disease ([place])   . Restless leg syndrome   . Shortness of breath dyspnea    Past Surgical History:  Procedure Laterality Date  . ABDOMINAL HYSTERECTOMY    . BREAST BIOPSY Right yrs ago   benign  . ENDARTERECTOMY Right 07/24/2014   Procedure: ENDARTERECTOMY CAROTID;  Surgeon: Algernon Huxley, MD;  Location: [facility] ORS;  Service:  Vascular;  Laterality: Right;  . EYE SURGERY Left    cataract  . FRACTURE SURGERY Left    fractured ankle  . TONSILLECTOMY     Family History  Problem Relation Age of Onset  . Pancreatic cancer Mother   . CAD Father   . Hypertension Father   . Pancreatic cancer Father   . Colon cancer Father   . CAD Brother   . Heart disease Brother     Heart attack  . Breast cancer Maternal Aunt     pt states several maternal aunts   Social History  Substance Use Topics  . Smoking status: Never Smoker  . Smokeless tobacco: Never Used  . Alcohol use No   Allergies  Allergen Reactions  . Codeine Other (See Comments)    Reaction:  Dizziness   . Liraglutide     Other reaction(s): nausea and stomach pain  . Wheat Bran Other (See Comments)    Reaction:  Sneezing    Prior to Admission medications   Medication Sig Start Date End Date Taking? Authorizing Provider  acetaminophen (TYLENOL) 325 MG tablet Take 2 tablets (650 mg total) by mouth every 6 (six) hours as needed for mild pain (or Fever >/= 101). 12/14/15  Yes Nicholes Mango, MD  albuterol (PROVENTIL) (2.5 MG/3ML) 0.083% nebulizer solution Take 3 mLs (2.5 mg  total) by nebulization every 4 (four) hours as needed for wheezing. 12/14/15  Yes Nicholes Mango, MD  aspirin EC 81 MG tablet Take 81 mg by mouth daily.   Yes Historical Provider, MD  Cholecalciferol (VITAMIN D3) 2000 units capsule Take by mouth.   Yes Historical Provider, MD  Cyanocobalamin (B-12) 1000 MCG/ML KIT Inject 1,000 mcg as directed every 30 (thirty) days. 02/02/16  Yes Historical Provider, MD  dapsone 25 MG tablet Take 150 mg by mouth daily. 1.5 tablets daily   Yes Historical Provider, MD  ferrous sulfate 325 (65 FE) MG tablet Take 1 tablet (325 mg total) by mouth daily with breakfast. 11/03/15  Yes Burnard Hawthorne, FNP  folic acid (FOLVITE) 1 MG tablet Take 1 mg by mouth daily. Take one pill daily on days that not taking methotrexate   Yes Historical Provider, MD  furosemide  (LASIX) 40 MG tablet Take 1 tablet (40 mg total) by mouth 2 (two) times daily. 05/02/16  Yes Alisa Graff, FNP  gabapentin (NEURONTIN) 800 MG tablet Take 800 mg by mouth 2 (two) times daily.   Yes Historical Provider, MD  hydrochlorothiazide (HYDRODIURIL) 12.5 MG tablet Take 12.5 mg by mouth 2 (two) times daily.    Yes Historical Provider, MD  insulin glargine (LANTUS) 100 UNIT/ML injection Inject 0.15 mLs (15 Units total) into the skin at bedtime. 02/08/16  Yes Burnard Hawthorne, FNP  insulin lispro (HUMALOG KWIKPEN) 100 UNIT/ML KiwkPen Inject 0.09 mLs (9 Units total) into the skin 3 (three) times daily with meals. HOLD if pre meal blood sugar less than 150. 03/07/16  Yes Burnard Hawthorne, FNP  lovastatin (MEVACOR) 20 MG tablet Take 20 mg by mouth at bedtime.   Yes Historical Provider, MD  methotrexate (RHEUMATREX) 2.5 MG tablet Take 10 mg by mouth once a week. Caution:Chemotherapy. Protect from light.  Take 4 tablets by mouth weekly   Yes Historical Provider, MD  omeprazole (PRILOSEC) 20 MG capsule Take 1 capsule (20 mg total) by mouth at bedtime. 03/09/16  Yes Burnard Hawthorne, FNP  potassium chloride (K-DUR) 10 MEQ tablet Take 1 tablet (10 mEq total) by mouth daily. Patient taking differently: Take 40 mEq by mouth 2 (two) times daily.  03/25/16  Yes Burnard Hawthorne, FNP  predniSONE (DELTASONE) 10 MG tablet Take 30 mg by mouth daily with breakfast.   Yes Historical Provider, MD  traMADol (ULTRAM) 50 MG tablet Take 1 tablet (50 mg total) by mouth daily as needed. 04/11/16  Yes Burnard Hawthorne, FNP  Wound Dressings (SILVASORB) GEL Apply 1 application topically 2 (two) times daily as needed. 04/25/16  Yes Burnard Hawthorne, FNP     Review of Systems  Constitutional: Positive for fatigue. Negative for appetite change.  HENT: Negative for congestion, postnasal drip and sore throat.   Eyes: Negative.   Respiratory: Positive for shortness of breath. Negative for cough and chest tightness.    Cardiovascular: Positive for leg swelling. Negative for chest pain and palpitations.  Gastrointestinal: Negative for abdominal distention and abdominal pain.  Endocrine: Negative.   Genitourinary: Negative.   Musculoskeletal: Negative for back pain and neck pain.  Skin: Positive for wound (left forearm due to fall).  Allergic/Immunologic: Negative.   Neurological: Positive for light-headedness. Negative for dizziness.  Hematological: Negative for adenopathy. Bruises/bleeds easily.  Psychiatric/Behavioral: Negative for dysphoric mood and sleep disturbance (sleeping on 2 pillows). The patient is not nervous/anxious.    Vitals:   05/30/16 1153  BP: (!) 115/48  Pulse:  93  Resp: 18  SpO2: 91%  Weight: 227 lb (103 kg)  Height: 5' 2"  (1.575 m)   Wt Readings from Last 3 Encounters:  05/30/16 227 lb (103 kg)  05/02/16 242 lb 6 oz (109.9 kg)  04/25/16 243 lb 6.4 oz (110.4 kg)   Lab Results  Component Value Date   CREATININE 0.82 05/02/2016   CREATININE 0.76 04/25/2016   CREATININE 1.01 03/11/2016   Physical Exam  Constitutional: She is oriented to person, place, and time. She appears well-developed and well-nourished.  HENT:  Head: Normocephalic and atraumatic.  Eyes: Conjunctivae are normal. Pupils are equal, round, and reactive to light.  Neck: Normal range of motion. Neck supple. No JVD present.  Cardiovascular: Normal rate and regular rhythm.   Pulmonary/Chest: Effort normal. She has no wheezes. She has no rales.  Abdominal: Soft. She exhibits no distension. There is no tenderness.  Musculoskeletal: She exhibits edema (3+ pitting edema in bilateral lower legs). She exhibits no tenderness.  Neurological: She is alert and oriented to person, place, and time.  Skin: Skin is warm and dry.  Psychiatric: She has a normal mood and affect. Her behavior is normal. Thought content normal.  Nursing note and vitals reviewed.     Assessment & Plan:  1: Chronic heart failure with  preserved ejection fraction- - NYHA class III - moderately fluid overloaded with edema today - continues to weigh at home but with assistance. Call for an overnight weight gain of >2 pounds or a weekly weight gain of >5 pounds.  - not using salt. Using pepper and salt substitute.  - drinking about 30-40 ounces of fluid daily - received her flu vaccine for this season already - advised to use naproxen sparingly - will change her diuretic to torsemide 9m twice daily. Can decrease it to 237mtwice daily if she has a good response to it.  - will check a BMP at her next office visit.   2: HTN- - BP looks great today - continue medications  3: Diabetes- - glucose this morning was 238 - admits to eating more sweet foods - saw PCP (Arnett) 04/25/16 and returns later this month  4: Recurrent falls- - living at home (was at [facility]- has around the clock caregivers - no longer receiving PT and feels like the legs/back are becoming more weak where she's falling more often and becoming less active - came into the office in a wheelchair as she says that her legs are quite weak  Patient did not bring her medications nor a list. Each medication was verbally reviewed with the patient and she was encouraged to bring the bottles to every visit to confirm accuracy of list.  Return in 1 month or sooner for any questions/problems before then.

## 2016-05-30 NOTE — ED Notes (Signed)
Patient given ED sandwich tray and ice water per Dr. Lenard Lance.

## 2016-05-30 NOTE — H&P (Signed)
[facility] [place] NAME: [name]    MR#:  [id number]  DATE OF BIRTH:  May 21, 1945  DATE OF ADMISSION:  05/30/2016  PRIMARY CARE PHYSICIAN: Mable Paris, FNP   REQUESTING/REFERRING PHYSICIAN: Harvest Dark, MD  CHIEF COMPLAINT:   Chief Complaint  Patient presents with  . Weakness    HISTORY OF PRESENT ILLNESS:  [name]  is a 71 y.o. female with a known history of anemia, asthma, diastolic CHF, CVA, diabetes, gastric reflux, presents to the emergency department with generalized weakness. According to the patient since yesterday evening she has felt progressively more weak, today she has been unable to stand on her own due to weakness. EMS states upon arrival the patient had a room air saturation of 88%. Patient has no home O2 requirement. Patient lives alone but has people check on her. Patient states normally she ambulates with a walker but has been unable to get out of bed today. Patient states an orange color to her urine with a foul smell but denies dysuria. She has worsening LE Edema for last few weeks. PAST MEDICAL HISTORY:   Past Medical History:  Diagnosis Date  . Anemia   . Asthma   . CHF (congestive heart failure) ([place])   . CVA (cerebral vascular accident) ([place])   . Diabetes mellitus without complication ([place])   . GERD (gastroesophageal reflux disease)   . Heart murmur   . History of hiatal hernia   . Hypertension   . Panic attacks   . Peripheral vascular disease ([place])   . Restless leg syndrome   . Shortness of breath dyspnea     PAST SURGICAL HISTORY:   Past Surgical History:  Procedure Laterality Date  . ABDOMINAL HYSTERECTOMY    . BREAST BIOPSY Right yrs ago   benign  . ENDARTERECTOMY Right 07/24/2014   Procedure: ENDARTERECTOMY CAROTID;  Surgeon: Algernon Huxley, MD;  Location: [facility] ORS;  Service: Vascular;  Laterality: Right;  . EYE SURGERY Left    cataract  . FRACTURE SURGERY Left    fractured  ankle  . TONSILLECTOMY      SOCIAL HISTORY:   Social History  Substance Use Topics  . Smoking status: Never Smoker  . Smokeless tobacco: Never Used  . Alcohol use No    FAMILY HISTORY:   Family History  Problem Relation Age of Onset  . Pancreatic cancer Mother   . CAD Father   . Hypertension Father   . Pancreatic cancer Father   . Colon cancer Father   . CAD Brother   . Heart disease Brother     Heart attack  . Breast cancer Maternal Aunt     pt states several maternal aunts    DRUG ALLERGIES:   Allergies  Allergen Reactions  . Codeine Other (See Comments)    Reaction:  Dizziness   . Liraglutide     Other reaction(s): nausea and stomach pain  . Wheat Bran Other (See Comments)    Reaction:  Sneezing     REVIEW OF SYSTEMS:   Review of Systems  Constitutional: Positive for malaise/fatigue. Negative for chills, fever and weight loss.  HENT: Negative for nosebleeds and sore throat.   Eyes: Negative for blurred vision.  Respiratory: Positive for shortness of breath. Negative for cough and wheezing.   Cardiovascular: Negative for chest pain, orthopnea, leg swelling and PND.  Gastrointestinal: Negative for abdominal pain, constipation, diarrhea, heartburn, nausea and vomiting.  Genitourinary: Negative for  dysuria and urgency.  Musculoskeletal: Negative for back pain.  Skin: Negative for rash.  Neurological: Positive for weakness. Negative for dizziness, speech change, focal weakness and headaches.  Endo/Heme/Allergies: Does not bruise/bleed easily.  Psychiatric/Behavioral: Negative for depression.   MEDICATIONS AT HOME:   Prior to Admission medications   Medication Sig Start Date End Date Taking? Authorizing Provider  acetaminophen (TYLENOL) 325 MG tablet Take 2 tablets (650 mg total) by mouth every 6 (six) hours as needed for mild pain (or Fever >/= 101). 12/14/15  Yes Nicholes Mango, MD  albuterol (PROVENTIL) (2.5 MG/3ML) 0.083% nebulizer solution Take 3 mLs (2.5  mg total) by nebulization every 4 (four) hours as needed for wheezing. 12/14/15  Yes Nicholes Mango, MD  aspirin EC 81 MG tablet Take 81 mg by mouth daily.   Yes Historical Provider, MD  Cholecalciferol (VITAMIN D3) 2000 units capsule Take by mouth.   Yes Historical Provider, MD  Cyanocobalamin (B-12) 1000 MCG/ML KIT Inject 1,000 mcg as directed every 30 (thirty) days. 02/02/16  Yes Historical Provider, MD  dapsone 25 MG tablet Take 150 mg by mouth daily. 1.5 tablets daily   Yes Historical Provider, MD  ferrous sulfate 325 (65 FE) MG tablet Take 1 tablet (325 mg total) by mouth daily with breakfast. 11/03/15  Yes Burnard Hawthorne, FNP  folic acid (FOLVITE) 1 MG tablet Take 1 mg by mouth daily. Take one pill daily on days that not taking methotrexate   Yes Historical Provider, MD  gabapentin (NEURONTIN) 800 MG tablet Take 800 mg by mouth 2 (two) times daily.   Yes Historical Provider, MD  hydrochlorothiazide (HYDRODIURIL) 12.5 MG tablet Take 12.5 mg by mouth 2 (two) times daily.    Yes Historical Provider, MD  insulin glargine (LANTUS) 100 UNIT/ML injection Inject 0.15 mLs (15 Units total) into the skin at bedtime. 02/08/16  Yes Burnard Hawthorne, FNP  insulin lispro (HUMALOG KWIKPEN) 100 UNIT/ML KiwkPen Inject 0.09 mLs (9 Units total) into the skin 3 (three) times daily with meals. HOLD if pre meal blood sugar less than 150. 03/07/16  Yes Burnard Hawthorne, FNP  lovastatin (MEVACOR) 20 MG tablet Take 20 mg by mouth at bedtime.   Yes Historical Provider, MD  methotrexate (RHEUMATREX) 2.5 MG tablet Take 10 mg by mouth once a week. Caution:Chemotherapy. Protect from light.  Take 4 tablets by mouth weekly   Yes Historical Provider, MD  omeprazole (PRILOSEC) 20 MG capsule Take 1 capsule (20 mg total) by mouth at bedtime. 03/09/16  Yes Burnard Hawthorne, FNP  potassium chloride (K-DUR) 10 MEQ tablet Take 1 tablet (10 mEq total) by mouth daily. Patient taking differently: Take 40 mEq by mouth 2 (two) times daily.   03/25/16  Yes Burnard Hawthorne, FNP  predniSONE (DELTASONE) 10 MG tablet Take 30 mg by mouth daily with breakfast.   Yes Historical Provider, MD  torsemide (DEMADEX) 20 MG tablet Take 2 tablets (40 mg total) by mouth 2 (two) times daily. 05/30/16 06/29/16 Yes Alisa Graff, FNP  traMADol (ULTRAM) 50 MG tablet Take 1 tablet (50 mg total) by mouth daily as needed. 04/11/16  Yes Burnard Hawthorne, FNP  Wound Dressings (SILVASORB) GEL Apply 1 application topically 2 (two) times daily as needed. 04/25/16  Yes Burnard Hawthorne, FNP      VITAL SIGNS:  Blood pressure (!) 145/72, pulse 84, temperature 97.3 F (36.3 C), temperature source Oral, resp. rate 14, height 5' 2" (1.575 m), weight 103 kg (227 lb), SpO2 93 %.  PHYSICAL EXAMINATION:  Physical Exam  GENERAL:  71 y.o.-year-old patient lying in the bed with no acute distress.  EYES: Pupils equal, round, reactive to light and accommodation. No scleral icterus. Extraocular muscles intact.  HEENT: Head atraumatic, normocephalic. Oropharynx and nasopharynx clear.  NECK:  Supple, no jugular venous distention. No thyroid enlargement, no tenderness.  LUNGS: Decreased breath sounds bilaterally, no wheezing, rales,rhonchi or crepitation. No use of accessory muscles of respiration.  CARDIOVASCULAR: S1, S2 normal. SE murmur +, No rubs, or gallops.  ABDOMEN: Soft, nontender, nondistended. Bowel sounds present. No organomegaly or mass.  EXTREMITIES: 3 + pedal edema, cyanosis, or clubbing.  NEUROLOGIC: Cranial nerves II through XII are intact. Muscle strength 5/5 in all extremities. Sensation intact. Gait not checked.  PSYCHIATRIC: The patient is alert and oriented x 3.  SKIN: No obvious rash, lesion, or ulcer.   LABORATORY PANEL:   CBC  Recent Labs Lab 05/30/16 1905  WBC 9.5  HGB 9.5*  HCT 28.6*  PLT 152   ------------------------------------------------------------------------------------------------------------------  Chemistries   Recent  Labs Lab 05/30/16 1905  NA 132*  K 2.6*  CL 83*  CO2 38*  GLUCOSE 397*  BUN 12  CREATININE 1.01*  CALCIUM 8.4*  AST 44*  ALT 32  ALKPHOS 147*  BILITOT 2.5*   ------------------------------------------------------------------------------------------------------------------  Cardiac Enzymes  Recent Labs Lab 05/30/16 1905  TROPONINI 0.03*   ------------------------------------------------------------------------------------------------------------------  RADIOLOGY:  Dg Chest Portable 1 View  Result Date: 05/30/2016 CLINICAL DATA:  Weakness starting at 8 a.m. EXAM: PORTABLE CHEST 1 VIEW COMPARISON:  12/10/2015 FINDINGS: Heart is top-normal in size. There are calcifications of the aortic arch consistent with atherosclerosis. Mitral annular calcifications are also present. Bilateral chronic interstitial prominence is noted which may reflect chronic bronchitic change. No disease tissue edema might also contribute to this appearance. Minimal atelectasis at the left lung base. No pneumothorax nor significant effusion. 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 IMPRESSION: Pulmonary interstitial prominence which may reflect changes of interstitial edema or possibly chronic bronchitic change. Stable borderline cardiomegaly with aortic atherosclerosis. Electronically Signed   By: Ashley Royalty M.D.   On: 05/30/2016 19:24   IMPRESSION AND PLAN:  71 year old female with known history of anemia, asthma, diastolic CHF, CVA, diabetes, gastric reflux, presents to the emergency department with generalized weakness.   * Acute on chronic diastolic CHF - IV Lasix 40 twice a day - NYHA class III - significant fluid overloaded today with lower extremity edema  - Check I's and O's and daily weights - Check echo and get cardiology consultation  * Severe hypokalemia - Replete and recheck - Check magnesium  * UTI - Based on UA - Start Rocephin, await urine culture  * HTN - BP looks  great today - continue medications  * Diabetes - Continue home medications - Check hemoglobin A1c and add sliding scale insulin  * Recurrent falls - PT, OT consultation    All the records are reviewed and case discussed with ED provider. Management plans discussed with the patient, family and they are in agreement.  CODE STATUS: FULL CODE  TOTAL TIME TAKING CARE OF THIS PATIENT: 45 minutes.    Max Sane M.D on 05/30/2016 at 11:25 PM  Between 7am to 6pm - Pager - [phone]  After 6pm go to [email or web address] - Proofreader  [facility]  Office  [phone]  CC: Primary care physician; Mable Paris, FNP   Note: This dictation was prepared with Dragon dictation along with smaller phrase  technology. Any transcriptional errors that result from this process are unintentional.

## 2016-05-31 ENCOUNTER — Inpatient Hospital Stay
Admit: 2016-05-31 | Discharge: 2016-05-31 | Disposition: A | Discharge status: Home / Self Care | Payer: Medicare Other | Attending: Internal Medicine | Admitting: Internal Medicine

## 2016-05-31 LAB — BASIC METABOLIC PANEL
ANION GAP: 7 (ref 5–15)
BUN: 10 mg/dL (ref 6–20)
CALCIUM: 8.1 mg/dL — AB (ref 8.9–10.3)
CO2: 41 mmol/L — ABNORMAL HIGH (ref 22–32)
Chloride: 87 mmol/L — ABNORMAL LOW (ref 101–111)
Creatinine, Ser: 0.78 mg/dL (ref 0.44–1.00)
Glucose, Bld: 252 mg/dL — ABNORMAL HIGH (ref 65–99)
POTASSIUM: 2.1 mmol/L — AB (ref 3.5–5.1)
SODIUM: 135 mmol/L (ref 135–145)

## 2016-05-31 LAB — ECHOCARDIOGRAM COMPLETE
Height: 62 in
Weight: 3774.4 oz

## 2016-05-31 LAB — CBC
HEMATOCRIT: 26.7 % — AB (ref 35.0–47.0)
HEMOGLOBIN: 8.8 g/dL — AB (ref 12.0–16.0)
MCH: 32.6 pg (ref 26.0–34.0)
MCHC: 33 g/dL (ref 32.0–36.0)
MCV: 98.6 fL (ref 80.0–100.0)
Platelets: 142 10*3/uL — ABNORMAL LOW (ref 150–440)
RBC: 2.71 MIL/uL — AB (ref 3.80–5.20)
RDW: 17 % — ABNORMAL HIGH (ref 11.5–14.5)
WBC: 10.8 10*3/uL (ref 3.6–11.0)

## 2016-05-31 LAB — POTASSIUM: POTASSIUM: 2.8 mmol/L — AB (ref 3.5–5.1)

## 2016-05-31 LAB — GLUCOSE, CAPILLARY
GLUCOSE-CAPILLARY: 223 mg/dL — AB (ref 65–99)
GLUCOSE-CAPILLARY: 301 mg/dL — AB (ref 65–99)
GLUCOSE-CAPILLARY: 339 mg/dL — AB (ref 65–99)
Glucose-Capillary: 211 mg/dL — ABNORMAL HIGH (ref 65–99)
Glucose-Capillary: 257 mg/dL — ABNORMAL HIGH (ref 65–99)
Glucose-Capillary: 252 mg/dL — ABNORMAL HIGH (ref 65–99)

## 2016-05-31 LAB — MAGNESIUM: MAGNESIUM: 1.8 mg/dL (ref 1.7–2.4)

## 2016-05-31 MED ORDER — FUROSEMIDE 10 MG/ML IJ SOLN
40.0000 mg | Freq: Two times a day (BID) | INTRAMUSCULAR | Status: DC
  Administered 2016-05-31 – 2016-06-02 (×5): 40 mg via INTRAVENOUS
  Filled 2016-05-31 (×5): qty 4

## 2016-05-31 MED ORDER — ONDANSETRON HCL 4 MG/2ML IJ SOLN: 4.0000 mg | Freq: Four times a day (QID) | INTRAMUSCULAR | Status: DC | PRN

## 2016-05-31 MED ORDER — SODIUM CHLORIDE 0.9 % IV SOLN: 250.0000 mL | INTRAVENOUS | Status: DC | PRN

## 2016-05-31 MED ORDER — POTASSIUM CHLORIDE CRYS ER 20 MEQ PO TBCR
40.0000 meq | EXTENDED_RELEASE_TABLET | Freq: Two times a day (BID) | ORAL | Status: DC
  Administered 2016-05-31 – 2016-06-02 (×6): 40 meq via ORAL
  Filled 2016-05-31 (×6): qty 2

## 2016-05-31 MED ORDER — SODIUM CHLORIDE 0.9% FLUSH: 3.0000 mL | INTRAVENOUS | Status: DC | PRN

## 2016-05-31 MED ORDER — CYANOCOBALAMIN 1000 MCG/ML IJ SOLN
1000.0000 ug | INTRAMUSCULAR | Status: DC
Start: 2016-05-31 — End: 2016-06-02
  Administered 2016-05-31: 1000 ug via INTRAMUSCULAR
  Filled 2016-05-31: qty 1

## 2016-05-31 MED ORDER — INSULIN ASPART 100 UNIT/ML ~~LOC~~ SOLN
3.0000 [IU] | Freq: Three times a day (TID) | SUBCUTANEOUS | Status: DC
  Administered 2016-05-31 – 2016-06-01 (×4): 3 [IU] via SUBCUTANEOUS
  Filled 2016-05-31 (×4): qty 3

## 2016-05-31 MED ORDER — FERROUS SULFATE 325 (65 FE) MG PO TABS
325.0000 mg | ORAL_TABLET | Freq: Every day | ORAL | Status: DC
  Administered 2016-05-31 – 2016-06-02 (×3): 325 mg via ORAL
  Filled 2016-05-31 (×3): qty 1

## 2016-05-31 MED ORDER — SODIUM CHLORIDE 0.9 % IV SOLN
30.0000 meq | Freq: Once | INTRAVENOUS | Status: AC
  Administered 2016-05-31: 30 meq via INTRAVENOUS
  Filled 2016-05-31: qty 15

## 2016-05-31 MED ORDER — ALBUTEROL SULFATE (2.5 MG/3ML) 0.083% IN NEBU: 2.5000 mg | INHALATION_SOLUTION | RESPIRATORY_TRACT | Status: DC | PRN

## 2016-05-31 MED ORDER — GABAPENTIN 400 MG PO CAPS
800.0000 mg | ORAL_CAPSULE | Freq: Two times a day (BID) | ORAL | Status: DC
Start: 2016-05-31 — End: 2016-06-02
  Administered 2016-05-31 – 2016-06-02 (×6): 800 mg via ORAL
  Filled 2016-05-31 (×6): qty 2

## 2016-05-31 MED ORDER — TRAZODONE HCL 50 MG PO TABS: 25.0000 mg | ORAL_TABLET | Freq: Every evening | ORAL | Status: DC | PRN

## 2016-05-31 MED ORDER — PANTOPRAZOLE SODIUM 40 MG PO TBEC
40.0000 mg | DELAYED_RELEASE_TABLET | Freq: Every day | ORAL | Status: DC
  Administered 2016-05-31 – 2016-06-02 (×3): 40 mg via ORAL
  Filled 2016-05-31 (×3): qty 1

## 2016-05-31 MED ORDER — ASPIRIN EC 81 MG PO TBEC
81.0000 mg | DELAYED_RELEASE_TABLET | Freq: Every day | ORAL | Status: DC
  Administered 2016-05-31 – 2016-06-02 (×3): 81 mg via ORAL
  Filled 2016-05-31 (×3): qty 1

## 2016-05-31 MED ORDER — HEPARIN SODIUM (PORCINE) 5000 UNIT/ML IJ SOLN
5000.0000 [IU] | Freq: Three times a day (TID) | INTRAMUSCULAR | Status: DC
  Administered 2016-05-31 – 2016-06-02 (×7): 5000 [IU] via SUBCUTANEOUS
  Filled 2016-05-31 (×7): qty 1

## 2016-05-31 MED ORDER — HYDROCHLOROTHIAZIDE 25 MG PO TABS
12.5000 mg | ORAL_TABLET | Freq: Two times a day (BID) | ORAL | Status: DC
  Administered 2016-05-31 – 2016-06-02 (×6): 12.5 mg via ORAL
  Filled 2016-05-31 (×6): qty 1

## 2016-05-31 MED ORDER — INSULIN GLARGINE 100 UNIT/ML ~~LOC~~ SOLN
15.0000 [IU] | Freq: Every day | SUBCUTANEOUS | Status: DC
  Administered 2016-05-31: 15 [IU] via SUBCUTANEOUS
  Filled 2016-05-31 (×2): qty 0.15

## 2016-05-31 MED ORDER — RAMIPRIL 2.5 MG PO CAPS
2.5000 mg | ORAL_CAPSULE | Freq: Every day | ORAL | Status: DC
  Administered 2016-05-31 – 2016-06-02 (×3): 2.5 mg via ORAL
  Filled 2016-05-31 (×3): qty 1

## 2016-05-31 MED ORDER — ONDANSETRON HCL 4 MG PO TABS: 4.0000 mg | ORAL_TABLET | Freq: Four times a day (QID) | ORAL | Status: DC | PRN

## 2016-05-31 MED ORDER — PRAVASTATIN SODIUM 10 MG PO TABS
10.0000 mg | ORAL_TABLET | Freq: Every day | ORAL | Status: DC
  Administered 2016-05-31 – 2016-06-01 (×2): 10 mg via ORAL
  Filled 2016-05-31 (×2): qty 1

## 2016-05-31 MED ORDER — POTASSIUM CHLORIDE 2 MEQ/ML IV SOLN
30.0000 meq | INTRAVENOUS | Status: AC
  Administered 2016-05-31 – 2016-06-01 (×2): 30 meq via INTRAVENOUS
  Filled 2016-05-31 (×2): qty 15

## 2016-05-31 MED ORDER — INSULIN GLARGINE 100 UNIT/ML ~~LOC~~ SOLN
18.0000 [IU] | Freq: Every day | SUBCUTANEOUS | Status: DC
  Administered 2016-05-31: 18 [IU] via SUBCUTANEOUS
  Filled 2016-05-31 (×2): qty 0.18

## 2016-05-31 MED ORDER — ACETAMINOPHEN 325 MG PO TABS: 650.0000 mg | ORAL_TABLET | Freq: Four times a day (QID) | ORAL | Status: DC | PRN

## 2016-05-31 MED ORDER — DOCUSATE SODIUM 100 MG PO CAPS
100.0000 mg | ORAL_CAPSULE | Freq: Two times a day (BID) | ORAL | Status: DC
Start: 2016-05-31 — End: 2016-06-02
  Administered 2016-05-31 – 2016-06-02 (×6): 100 mg via ORAL
  Filled 2016-05-31 (×6): qty 1

## 2016-05-31 MED ORDER — SODIUM CHLORIDE 0.9% FLUSH
3.0000 mL | Freq: Two times a day (BID) | INTRAVENOUS | Status: DC
  Administered 2016-05-31 – 2016-06-02 (×6): 3 mL via INTRAVENOUS

## 2016-05-31 MED ORDER — BISACODYL 5 MG PO TBEC: 5.0000 mg | DELAYED_RELEASE_TABLET | Freq: Every day | ORAL | Status: DC | PRN

## 2016-05-31 NOTE — Progress Notes (Signed)
Inpatient Diabetes Program Recommendations  AACE/ADA: New Consensus Statement on Inpatient Glycemic Control (2015)  Target Ranges:  Prepandial:   less than 140 mg/dL      Peak postprandial:   less than 180 mg/dL (1-2 hours)      Critically ill patients:  140 - 180 mg/dL   Lab Results  Component Value Date   GLUCAP 301 (H) 05/31/2016   HGBA1C 8.1 (H) 12/30/2015    A1C on Care Everywhere 5.3% on 04/15/16 (??accurate Hgb today 8.8 g /dL)  Review of Glycemic Control  Results for CYLEE, DATTILO (MRN [id number]) as of 05/31/2016 08:46  Ref. Range 12/14/2015 17:12 05/30/2016 19:09 05/31/2016 00:05 05/31/2016 02:25 05/31/2016 07:45  Glucose-Capillary Latest Ref Range: 65 - 99 mg/dL 338 (H) 250 (H) 539 (H) 301 (H) 211 (H)    Diabetes history: Type 2 Outpatient Diabetes medications: Lantus 15 unit qhs, Humalog 9 units tid with meal if glucose > 150mg /dl Current orders for Inpatient glycemic control: Lantus 15 units qhs, Novolog 0-9 units tid, Novolog 0-5 units qhs  Inpatient Diabetes Program Recommendations:  Consider increasing Lantus insulin to 18 units qhs (fasting blood sugars remain elevated)  Consider adding Novolog 3 units tid with meals (hold if she eats less than 50%)  Continue Novolog correction as ordered.   , RN, BA, MHA, CDE Diabetes Coordinator Inpatient Diabetes Program  [phone] (Team Pager) [phone] [facility] Office) 05/31/2016 8:47 AM

## 2016-05-31 NOTE — Care Management Important Message (Signed)
Important Message  Patient Details  Name: [name] MRN: [id number] Date of Birth: September 22, 1945   Medicare Important Message Given:  Yes Signed notice given   Eber Hong, RN 05/31/2016, 3:08 PM

## 2016-05-31 NOTE — Progress Notes (Signed)
[facility] [place] [facility]   PATIENT NAME: [name]    MR#:  [id number]  DATE OF BIRTH:  04/21/1945  SUBJECTIVE:  CHIEF COMPLAINT:   Chief Complaint  Patient presents with  . Weakness   Shortness of breath and generalized weakness. On O2 [place] 2L. REVIEW OF SYSTEMS:  Review of Systems  Constitutional: Positive for malaise/fatigue. Negative for chills and fever.  HENT: Negative for congestion.   Eyes: Negative for blurred vision and double vision.  Respiratory: Positive for sputum production and shortness of breath. Negative for cough, hemoptysis, wheezing and stridor.   Cardiovascular: Positive for leg swelling. Negative for chest pain and palpitations.  Gastrointestinal: Negative for abdominal pain, blood in stool, diarrhea, melena, nausea and vomiting.  Genitourinary: Negative for dysuria and hematuria.  Musculoskeletal: Negative for back pain.  Neurological: Positive for weakness. Negative for dizziness, focal weakness, loss of consciousness and headaches.  Psychiatric/Behavioral: Negative for depression. The patient is not nervous/anxious.     DRUG ALLERGIES:   Allergies  Allergen Reactions  . Codeine Other (See Comments)    Reaction:  Dizziness   . Liraglutide     Other reaction(s): nausea and stomach pain  . Wheat Bran Other (See Comments)    Reaction:  Sneezing    VITALS:  Blood pressure 137/62, pulse 77, temperature 97.8 F (36.6 C), temperature source Oral, resp. rate 18, height 5\' 2"  (1.575 m), weight 235 lb 14.4 oz (107 kg), SpO2 93 %. PHYSICAL EXAMINATION:  Physical Exam  Constitutional: She is oriented to person, place, and time and well-developed, well-nourished, and in no distress.  HENT:  Head: Normocephalic.  Mouth/Throat: Oropharynx is clear and moist.  Eyes: Conjunctivae and EOM are normal.  Neck: Normal range of motion. Neck supple. No JVD present. No tracheal deviation present. No thyromegaly present.    Cardiovascular: Normal rate and regular rhythm.  Exam reveals no gallop.   Murmur heard. Systolic murmur 3/6  Pulmonary/Chest: Effort normal. No respiratory distress. She has no wheezes. She has rales.  Abdominal: Soft. Bowel sounds are normal. She exhibits no distension. There is no tenderness.  Musculoskeletal: Normal range of motion. She exhibits edema. She exhibits no tenderness.  Neurological: She is alert and oriented to person, place, and time. No cranial nerve deficit.  Skin: No rash noted. No erythema.  Psychiatric: Affect normal.   LABORATORY PANEL:  Female CBC  Recent Labs Lab 05/31/16 0447  WBC 10.8  HGB 8.8*  HCT 26.7*  PLT 142*   ------------------------------------------------------------------------------------------------------------------ Chemistries   Recent Labs Lab 05/30/16 1905 05/31/16 0447  NA 132* 135  K 2.6* 2.1*  CL 83* 87*  CO2 38* 41*  GLUCOSE 397* 252*  BUN 12 10  CREATININE 1.01* 0.78  CALCIUM 8.4* 8.1*  MG  --  1.8  AST 44*  --   ALT 32  --   ALKPHOS 147*  --   BILITOT 2.5*  --    RADIOLOGY:  Dg Chest Portable 1 View  Result Date: 05/30/2016 CLINICAL DATA:  Weakness starting at 8 a.m. EXAM: PORTABLE CHEST 1 VIEW COMPARISON:  12/10/2015 FINDINGS: Heart is top-normal in size. There are calcifications of the aortic arch consistent with atherosclerosis. Mitral annular calcifications are also present. Bilateral chronic interstitial prominence is noted which may reflect chronic bronchitic change. No disease tissue edema might also contribute to this appearance. Minimal atelectasis at the left lung base. No pneumothorax nor significant effusion. 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 IMPRESSION: Pulmonary  interstitial prominence which may reflect changes of interstitial edema or possibly chronic bronchitic change. Stable borderline cardiomegaly with aortic atherosclerosis. Electronically Signed   By: Tollie Eth M.D.   On:  05/30/2016 19:24   ASSESSMENT AND PLAN:   71 year old female with known history of anemia, asthma, diastolic CHF, CVA, diabetes, gastric reflux, presents to the emergency department with generalized weakness.   * Acute on chronic diastolic CHF, NYHA class III Continue IV Lasix 40 twice a day Follow-up echo and cardiology consultation  * Severe hypokalemia. K 2.1 - Replete and follow-up BMP. magnesium is normal.  * UTI - Based on UA - Started Rocephin, follow-up urine culture  * HTN. Controlled - continue medications  * Diabetes Lantus insulin to 18 units qhs (fasting blood sugars remain elevated) Consider adding Novolog 3 units tid with meals (hold if she eats less than 50%)  Anemia of chronic disease. Check stool occult and follow-up hemoglobin.  * Recurrent falls and generalized weakness - PT, OT consultation  All the records are reviewed and case discussed with Care Management/Social Worker. Management plans discussed with the patient, family and they are in agreement.  CODE STATUS: Full Code  TOTAL TIME TAKING CARE OF THIS PATIENT: 38 minutes.   More than 50% of the time was spent in counseling/coordination of care: YES  POSSIBLE D/C IN 3 DAYS, DEPENDING ON CLINICAL CONDITION.   Shaune Pollack M.D on 05/31/2016 at 1:47 PM  Between 7am to 6pm - Pager - [phone]  After 6pm go to [email or web address] - Social research officer, government  [facility] [place] [facility]  Office  [phone]  CC: Primary care physician; Rennie Plowman, FNP  Note: This dictation was prepared with Dragon dictation along with smaller phrase technology. Any transcriptional errors that result from this process are unintentional.

## 2016-05-31 NOTE — Progress Notes (Signed)
MEDICATION RELATED CONSULT NOTE - INITIAL   Pharmacy Consult for electrolyte replacement Indication: hypokalemia  Allergies  Allergen Reactions  . Codeine Other (See Comments)    Reaction:  Dizziness   . Liraglutide     Other reaction(s): nausea and stomach pain  . Wheat Bran Other (See Comments)    Reaction:  Sneezing     Patient Measurements: Height: 5\' 2"  (157.5 cm) Weight: 235 lb 14.4 oz (107 kg) IBW/kg (Calculated) : 50.1 Adjusted Body Weight: n/a  Vital Signs: Temp: 97.7 F (36.5 C) (04/03 1415) Temp Source: Oral (04/03 1415) BP: 116/59 (04/03 1415) Pulse Rate: 79 (04/03 1415) Intake/Output from previous day: 04/02 0701 - 04/03 0700 In: 825  Out: -  Intake/Output from this shift: Total I/O In: 1730 [P.O.:480; Other:1250] Out: 1250 [Urine:1250]  Labs:  Recent Labs  05/30/16 1905 05/31/16 0447  WBC 9.5 10.8  HGB 9.5* 8.8*  HCT 28.6* 26.7*  PLT 152 142*  CREATININE 1.01* 0.78  MG  --  1.8  ALBUMIN 2.9*  --   PROT 5.9*  --   AST 44*  --   ALT 32  --   ALKPHOS 147*  --   BILITOT 2.5*  --    Estimated Creatinine Clearance: 75.3 mL/min (by C-G formula based on SCr of 0.78 mg/dL).   Microbiology: No results found for this or any previous visit (from the past 720 hour(s)).  Medical History: Past Medical History:  Diagnosis Date  . Anemia   . Asthma   . CHF (congestive heart failure) ([facility])   . CVA (cerebral vascular accident) ([facility])   . Diabetes mellitus without complication ([facility])   . GERD (gastroesophageal reflux disease)   . Heart murmur   . History of hiatal hernia   . Hypertension   . Panic attacks   . Peripheral vascular disease ([facility])   . Restless leg syndrome   . Shortness of breath dyspnea     Medications:    Assessment: K+ 2.1  Goal of Therapy:  K+ WNL.  Plan:  Patient currently ordered KCl 40 mEq PO BID. Will add 30 mEq KCl IV x1 for total of 150 mEq K+ in 24 hours before BMP on 4/4 AM. Also f/u magnesium level in AM.  4/3  1446 K 2.8. Give potassium chloride 30 mEq IV Q4H x 2 doses and recheck electrolytes with AM labs.  6/3, Pharm.D., BCPS Clinical Pharmacist 05/31/2016,6:48 PM

## 2016-05-31 NOTE — Progress Notes (Signed)
*  PRELIMINARY RESULTS* Echocardiogram 2D Echocardiogram has been performed.  Cristela Blue 05/31/2016, 11:26 AM

## 2016-05-31 NOTE — Care Management (Signed)
Patient presents from home with weakness.  She is followed by the heart failure clinic and was last seen on 05/30/16.  She presented to the ED later the same evening.  Patient is verbalizing complianace with her treatment regime.Marland Kitchen She was recently changed to torsemide from lasix .  Is current with her PCP Rennie Plowman, MD  If she is able to return hone would benefit from home health nurse/ PT/OT and telehealth.  She would be agreeable to have home health.  She has hired caregivers and access to a walker and wheelchair.  her current need for 02 is acute. has had a previous stay at [facility] in the Fall of 2017.  her caregivers transport her to MD appointments and assist with adls.  Have request PT consult.

## 2016-05-31 NOTE — Progress Notes (Addendum)
MEDICATION RELATED CONSULT NOTE - INITIAL   Pharmacy Consult for electrolyte replacement Indication: hypokalemia  Allergies  Allergen Reactions  . Codeine Other (See Comments)    Reaction:  Dizziness   . Liraglutide     Other reaction(s): nausea and stomach pain  . Wheat Bran Other (See Comments)    Reaction:  Sneezing     Patient Measurements: Height: 5\' 2"  (157.5 cm) Weight: 235 lb 14.4 oz (107 kg) IBW/kg (Calculated) : 50.1 Adjusted Body Weight: n/a  Vital Signs: Temp: 97.8 F (36.6 C) (04/03 0457) Temp Source: Oral (04/03 0457) BP: 128/64 (04/03 0457) Pulse Rate: 73 (04/03 0457) Intake/Output from previous day: 04/02 0701 - 04/03 0700 In: 825  Out: -  Intake/Output from this shift: Total I/O In: 825 [Other:825] Out: -   Labs:  Recent Labs  05/30/16 1905 05/31/16 0447  WBC 9.5 10.8  HGB 9.5* 8.8*  HCT 28.6* 26.7*  PLT 152 142*  CREATININE 1.01* 0.78  ALBUMIN 2.9*  --   PROT 5.9*  --   AST 44*  --   ALT 32  --   ALKPHOS 147*  --   BILITOT 2.5*  --    Estimated Creatinine Clearance: 75.3 mL/min (by C-G formula based on SCr of 0.78 mg/dL).   Microbiology: No results found for this or any previous visit (from the past 720 hour(s)).  Medical History: Past Medical History:  Diagnosis Date  . Anemia   . Asthma   . CHF (congestive heart failure) ([facility])   . CVA (cerebral vascular accident) ([facility])   . Diabetes mellitus without complication ([facility])   . GERD (gastroesophageal reflux disease)   . Heart murmur   . History of hiatal hernia   . Hypertension   . Panic attacks   . Peripheral vascular disease ([facility])   . Restless leg syndrome   . Shortness of breath dyspnea     Medications:    Assessment: K+ 2.1  Goal of Therapy:  K+ WNL.  Plan:  Patient currently ordered KCl 40 mEq PO BID. Will add 30 mEq KCl IV x1 for total of 150 mEq K+ in 24 hours before BMP on 4/4 AM. Also f/u magnesium level in AM.  Nuala Chiles S 05/31/2016,6:59 AM

## 2016-05-31 NOTE — Progress Notes (Signed)
Dr. Juliene Pina made aware of K+2.1. Per Dr. Juliene Pina pharmacy consult for electrolytes.

## 2016-05-31 NOTE — Consult Note (Signed)
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  Seen at [facility] in [place]. Currently treating with vaseline gauze.   Wound type:ID to pannus  Pressure Ulcer POA: Yes Measurement:LEft flank 2 cm x 2 cm x 0.2 cm  Mid abdominal pannus 1 cm x 1 cm x 0.1 cm  Erythema and scattered denuded skin under abdominal pannus Wound ELF:YBOF and moist Drainage (amount, consistency, odor) Minimal serosanguinous  Musty odor.  Periwound:Erythema, sweat and moisture present Dressing procedure/placement/frequency:Cleanse under abdominal pannus with soap and water and pat gently dry.  Interdry Ag to nonintact skin and under pannus Measure and cut length of InterDry Ag+ to fit in skin folds that have skin breakdown Tuck InterDry  Ag+ fabric into skin folds in a single layer, allow for 2 inches of overhang from skin edges to allow for wicking to occur May remove to bathe; dry area thoroughly and then tuck into affected areas again Do not apply any creams or ointments when using InterDry Ag+ DO NOT THROW AWAY FOR 5 DAYS unless soiled with stool DO NOT [facility] product, this will inactivate the silver in the material  New sheet of Interdry Ag+ should be applied after 5 days of use if patient continues to have skin breakdown  Will not follow at this time.  Please re-consult if needed.  Maple Hudson RN BSN CWON Pager [phone]

## 2016-05-31 NOTE — Progress Notes (Signed)
Pt arrived to floor via stretcher from ER. Pt A&O with no complaints of pain. Telemetry monitor applied and called to CCMD. Yellow socks, high fall risk. Call bell within reach,booklet given.

## 2016-05-31 NOTE — Progress Notes (Signed)
Dr. Imogene Burn made aware of K+ 2.8

## 2016-05-31 NOTE — Consult Note (Signed)
[facility]  CARDIOLOGY CONSULT NOTE  Patient ID: [name] MRN: [id number] DOB/AGE: Jun 10, 1945 71 y.o.  Admit date: 05/30/2016 Referring Physician Dr. Bridgett Larsson Primary Physician   Primary Cardiologist Dr. Clayborn Bigness Reason for Consultation CHF  HPI: Pt is a 71 yo female with history of diatollic heart failure, severe aortic stenosis who was admitted after presenting to the er with complaints of genralized weakness and inabillity to stand on her own. Her rroom air saturation was 88%. She is on no home oxygen. She also complained of foul smelling uring and worsening le edema. Echo showed preserved lv funciton with severe aortic stenosis. She has improved with lasix and oxygen. She was noted to be hypokalemic with serum K of 2.6. Her renal funciton was normal but had hyponatremia. She has ruled out for an mi. EKG showed nsr with no ischemia. CXR revealed pulmonary interstitial prominence with possible interstitial edema.   Review of Systems  Constitutional: Positive for malaise/fatigue.  HENT: Negative.   Eyes: Negative.   Respiratory: Positive for shortness of breath.   Cardiovascular: Positive for leg swelling.  Gastrointestinal: Negative.   Genitourinary: Positive for dysuria.  Musculoskeletal: Negative.   Skin: Negative.   Neurological: Positive for weakness.  Endo/Heme/Allergies: Negative.   Psychiatric/Behavioral: Negative.     Past Medical History:  Diagnosis Date  . Anemia   . Asthma   . CHF (congestive heart failure) ([place])   . CVA (cerebral vascular accident) ([place])   . Diabetes mellitus without complication ([place])   . GERD (gastroesophageal reflux disease)   . Heart murmur   . History of hiatal hernia   . Hypertension   . Panic attacks   . Peripheral vascular disease ([place])   . Restless leg syndrome   . Shortness of breath dyspnea     Family History  Problem Relation Age of Onset  . Pancreatic cancer Mother   . CAD Father   .  Hypertension Father   . Pancreatic cancer Father   . Colon cancer Father   . CAD Brother   . Heart disease Brother     Heart attack  . Breast cancer Maternal Aunt     pt states several maternal aunts    Social History   Social History  . Marital status: Widowed    Spouse name: N/A  . Number of children: N/A  . Years of education: N/A   Occupational History  . Not on file.   Social History Main Topics  . Smoking status: Never Smoker  . Smokeless tobacco: Never Used  . Alcohol use No  . Drug use: No  . Sexual activity: No   Other Topics Concern  . Not on file   Social History Narrative   Retired- [employer]    2 children; boy and girl   3 grandchildren          Past Surgical History:  Procedure Laterality Date  . ABDOMINAL HYSTERECTOMY    . BREAST BIOPSY Right yrs ago   benign  . ENDARTERECTOMY Right 07/24/2014   Procedure: ENDARTERECTOMY CAROTID;  Surgeon: Algernon Huxley, MD;  Location: [facility] ORS;  Service: Vascular;  Laterality: Right;  . EYE SURGERY Left    cataract  . FRACTURE SURGERY Left    fractured ankle  . TONSILLECTOMY       Prescriptions Prior to Admission  Medication Sig Dispense Refill Last Dose  . acetaminophen (TYLENOL) 325 MG tablet Take 2 tablets (650 mg total) by  mouth every 6 (six) hours as needed for mild pain (or Fever >/= 101).   prn  . albuterol (PROVENTIL) (2.5 MG/3ML) 0.083% nebulizer solution Take 3 mLs (2.5 mg total) by nebulization every 4 (four) hours as needed for wheezing. 75 mL 12 prn  . aspirin EC 81 MG tablet Take 81 mg by mouth daily.   05/30/2016 at Unknown time  . Cholecalciferol (VITAMIN D3) 2000 units capsule Take by mouth.   05/30/2016 at Unknown time  . Cyanocobalamin (B-12) 1000 MCG/ML KIT Inject 1,000 mcg as directed every 30 (thirty) days.   Past Month at Unknown time  . dapsone 25 MG tablet Take 150 mg by mouth daily. 1.5 tablets daily   05/30/2016 at Unknown time  . ferrous sulfate 325 (65 FE) MG tablet Take 1 tablet (325 mg  total) by mouth daily with breakfast. 30 tablet 3 05/30/2016 at Unknown time  . folic acid (FOLVITE) 1 MG tablet Take 1 mg by mouth daily. Take one pill daily on days that not taking methotrexate   05/30/2016 at Unknown time  . gabapentin (NEURONTIN) 800 MG tablet Take 800 mg by mouth 2 (two) times daily.   05/30/2016 at Unknown time  . hydrochlorothiazide (HYDRODIURIL) 12.5 MG tablet Take 12.5 mg by mouth 2 (two) times daily.    05/30/2016 at Unknown time  . insulin glargine (LANTUS) 100 UNIT/ML injection Inject 0.15 mLs (15 Units total) into the skin at bedtime. 10 mL 3 05/29/2016 at Unknown time  . insulin lispro (HUMALOG KWIKPEN) 100 UNIT/ML KiwkPen Inject 0.09 mLs (9 Units total) into the skin 3 (three) times daily with meals. HOLD if pre meal blood sugar less than 150. 5 pen 1 05/30/2016 at Unknown time  . lovastatin (MEVACOR) 20 MG tablet Take 20 mg by mouth at bedtime.   05/29/2016 at Unknown time  . methotrexate (RHEUMATREX) 2.5 MG tablet Take 10 mg by mouth once a week. Caution:Chemotherapy. Protect from light.  Take 4 tablets by mouth weekly   Past Week at Unknown time  . omeprazole (PRILOSEC) 20 MG capsule Take 1 capsule (20 mg total) by mouth at bedtime. 90 capsule 3 05/29/2016 at Unknown time  . potassium chloride (K-DUR) 10 MEQ tablet Take 1 tablet (10 mEq total) by mouth daily. (Patient taking differently: Take 40 mEq by mouth 2 (two) times daily. ) 15 tablet 2 05/30/2016 at Unknown time  . predniSONE (DELTASONE) 10 MG tablet Take 30 mg by mouth daily with breakfast.   05/30/2016 at Unknown time  . torsemide (DEMADEX) 20 MG tablet Take 2 tablets (40 mg total) by mouth 2 (two) times daily. 120 tablet 3 05/30/2016 at Unknown time  . traMADol (ULTRAM) 50 MG tablet Take 1 tablet (50 mg total) by mouth daily as needed. 30 tablet 0 prn  . Wound Dressings (SILVASORB) GEL Apply 1 application topically 2 (two) times daily as needed. 1 Tube 3 prn    Physical Exam: Blood pressure (!) 119/54, pulse 91, temperature  98.2 F (36.8 C), temperature source Oral, resp. rate 16, height _0  (1.575 m), weight 107 kg (235 lb 14.4 oz), SpO2 93 %.   Wt Readings from Last 1 Encounters:  05/31/16 107 kg (235 lb 14.4 oz)     General appearance: cooperative Resp: rhonchi bibasilar Cardio: regular rate and rhythm and systolic murmur: late systolic 3/6, crescendo and decrescendo at lower left sternal border, throughout the precordium, radiates to carotids GI: soft, non-tender; bowel sounds normal; no masses,  no organomegaly Extremities: edema  2-3 + le edema Neurologic: Grossly normal  Labs:   Lab Results  Component Value Date   WBC 10.8 05/31/2016   HGB 8.8 (L) 05/31/2016   HCT 26.7 (L) 05/31/2016   MCV 98.6 05/31/2016   PLT 142 (L) 05/31/2016    Recent Labs Lab 05/30/16 1905 05/31/16 0447 05/31/16 1446  NA 132* 135  --   K 2.6* 2.1* 2.8*  CL 83* 87*  --   CO2 38* 41*  --   BUN 12 10  --   CREATININE 1.01* 0.78  --   CALCIUM 8.4* 8.1*  --   PROT 5.9*  --   --   BILITOT 2.5*  --   --   ALKPHOS 147*  --   --   ALT 32  --   --   AST 44*  --   --   GLUCOSE 397* 252*  --    Lab Results  Component Value Date   CKMB 1.2 12/20/2013   TROPONINI 0.03 (HH) 05/30/2016        ASSESSMENT AND PLAN:  71 yo female with history of diastollic heart failure, aortic stenosis admitted with weakness and sob with evidence of at least mild pulmonary edema on cxr. Has ruled out for an mi. Echo revealed no significant change from one done a year ago. Her aortic stenosis is likely playing a role but does not have critical stenosis at present and would not proceed with aortic valve replacement at present. Would agree with careful diuresis follow renal function. Will need k repleated. Will consider addition of low dose beta blockers. Would not increase lisinopril due to fixed cardiac output. Will follow with you.  Signed: Teodoro Spray MD, [facility] 05/31/2016, 7:40 PM

## 2016-06-01 ENCOUNTER — Ambulatory Visit: Payer: Medicare Other | Admitting: Family

## 2016-06-01 ENCOUNTER — Ambulatory Visit: Payer: Medicare Other | Admitting: Physical Therapy

## 2016-06-01 LAB — BASIC METABOLIC PANEL
ANION GAP: 8 (ref 5–15)
BUN: 11 mg/dL (ref 6–20)
CO2: 39 mmol/L — ABNORMAL HIGH (ref 22–32)
Calcium: 8.1 mg/dL — ABNORMAL LOW (ref 8.9–10.3)
Chloride: 89 mmol/L — ABNORMAL LOW (ref 101–111)
Creatinine, Ser: 0.55 mg/dL (ref 0.44–1.00)
GFR calc Af Amer: >60 mL/min (ref >60)
GLUCOSE: 242 mg/dL — AB (ref 65–99)
POTASSIUM: 3.5 mmol/L (ref 3.5–5.1)
Sodium: 136 mmol/L (ref 135–145)

## 2016-06-01 LAB — HEMOGLOBIN A1C
HEMOGLOBIN A1C: 5.5 % (ref 4.8–5.6)
MEAN PLASMA GLUCOSE: 111 mg/dL

## 2016-06-01 LAB — GLUCOSE, CAPILLARY
GLUCOSE-CAPILLARY: 215 mg/dL — AB (ref 65–99)
GLUCOSE-CAPILLARY: 240 mg/dL — AB (ref 65–99)
GLUCOSE-CAPILLARY: 254 mg/dL — AB (ref 65–99)
Glucose-Capillary: 273 mg/dL — ABNORMAL HIGH (ref 65–99)

## 2016-06-01 LAB — PHOSPHORUS: Phosphorus: 3 mg/dL (ref 2.5–4.6)

## 2016-06-01 LAB — MAGNESIUM: Magnesium: 1.8 mg/dL (ref 1.7–2.4)

## 2016-06-01 LAB — HEMOGLOBIN: Hemoglobin: 9.1 g/dL — ABNORMAL LOW (ref 12.0–16.0)

## 2016-06-01 MED ORDER — INSULIN ASPART 100 UNIT/ML ~~LOC~~ SOLN
6.0000 [IU] | Freq: Three times a day (TID) | SUBCUTANEOUS | Status: DC
  Administered 2016-06-01 – 2016-06-02 (×3): 6 [IU] via SUBCUTANEOUS
  Filled 2016-06-01 (×3): qty 6

## 2016-06-01 MED ORDER — INSULIN GLARGINE 100 UNIT/ML ~~LOC~~ SOLN
20.0000 [IU] | Freq: Every day | SUBCUTANEOUS | Status: DC
  Administered 2016-06-01: 20 [IU] via SUBCUTANEOUS
  Filled 2016-06-01 (×3): qty 0.2

## 2016-06-01 NOTE — Progress Notes (Signed)
MEDICATION RELATED CONSULT NOTE - INITIAL   Pharmacy Consult for electrolyte replacement Indication: hypokalemia  Allergies  Allergen Reactions  . Codeine Other (See Comments)    Reaction:  Dizziness   . Liraglutide     Other reaction(s): nausea and stomach pain    Patient Measurements: Height: 5\' 2"  (157.5 cm) Weight: 234 lb 12.6 oz (106.5 kg) IBW/kg (Calculated) : 50.1 Adjusted Body Weight: n/a  Vital Signs: Temp: 97.7 F (36.5 C) (04/04 1205) Temp Source: Oral (04/04 0457) BP: 102/41 (04/04 1205) Pulse Rate: 79 (04/04 1205) Intake/Output from previous day: 04/03 0701 - 04/04 0700 In: 2457 [P.O.:942; IV Piggyback:265] Out: 2300 [Urine:2300] Intake/Output from this shift: Total I/O In: 720 [P.O.:720] Out: -   Labs:  Recent Labs  05/30/16 1905 05/31/16 0447 06/01/16 0500  WBC 9.5 10.8  --   HGB 9.5* 8.8* 9.1*  HCT 28.6* 26.7*  --   PLT 152 142*  --   CREATININE 1.01* 0.78 0.55  MG  --  1.8 1.8  PHOS  --   --  3.0  ALBUMIN 2.9*  --   --   PROT 5.9*  --   --   AST 44*  --   --   ALT 32  --   --   ALKPHOS 147*  --   --   BILITOT 2.5*  --   --    Estimated Creatinine Clearance: 75.1 mL/min (by C-G formula based on SCr of 0.55 mg/dL).   Microbiology: Recent Results (from the past 720 hour(s))  Urine culture     Status: Abnormal (Preliminary result)   Collection Time: 05/30/16  7:39 PM  Result Value Ref Range Status   Specimen Description URINE, RANDOM  Final   Special Requests NONE  Final   Culture (A)  Final    >=100,000 COLONIES/mL KLEBSIELLA PNEUMONIAE SUSCEPTIBILITIES TO FOLLOW Performed at [facility], [address], [place], [place]    Report Status PENDING  Incomplete    Medical History: Past Medical History:  Diagnosis Date  . Anemia   . Asthma   . CHF (congestive heart failure) ([facility])   . CVA (cerebral vascular accident) ([facility])   . Diabetes mellitus without complication ([facility])   . GERD (gastroesophageal reflux  disease)   . Heart murmur   . History of hiatal hernia   . Hypertension   . Panic attacks   . Peripheral vascular disease ([facility])   . Restless leg syndrome   . Shortness of breath dyspnea     Medications:    Assessment: K+ 3.5  Goal of Therapy:  K+ WNL.  Plan:  Patient currently ordered KCl 40 mEq PO BID. No further supplementation at this time. Will follow up on AM labs.  [id number], PharmD, BCPS 06/01/2016 2:37 PM

## 2016-06-01 NOTE — Progress Notes (Signed)
Wheat bran documented in pt chart as allergy. Patient verbalizes that she eats wheat/wheat bran at home regularly and requesting allergy to be removed. Allergy removed at pt request  Romelle Starcher MS, RD, LDN [phone] Pager  [phone] Weekend/On-Call Pager

## 2016-06-01 NOTE — Progress Notes (Signed)
Inpatient Diabetes Program Recommendations  AACE/ADA: New Consensus Statement on Inpatient Glycemic Control (2015)  Target Ranges:  Prepandial:   less than 140 mg/dL      Peak postprandial:   less than 180 mg/dL (1-2 hours)      Critically ill patients:  140 - 180 mg/dL   Results for TAWNEY, VANORMAN (MRN [id number]) as of 06/01/2016 12:49  Ref. Range 05/31/2016 07:45 05/31/2016 11:36 05/31/2016 16:48 05/31/2016 21:14  Glucose-Capillary Latest Ref Range: 65 - 99 mg/dL 951 (H) 884 (H) 166 (H) 252 (H)   Results for KEYONNA, COMUNALE (MRN [id number]) as of 06/01/2016 12:49  Ref. Range 06/01/2016 08:05 06/01/2016 11:55  Glucose-Capillary Latest Ref Range: 65 - 99 mg/dL 932 (H) 355 (H)    Home DM Meds: Lantus 15 units QHS       Humalog 9 units TID  Current Insulin Orders: Lantus 18 units QHS      Novolog Sensitive Correction Scale/ SSI (0-9 units) TID AC + HS      Novolog 3 units TID with meals     MD- Please consider the following in-hospital insulin adjustments:  1. Increase Lantus further to 20 units QHS  2. Increase Novolog Meal Coverage to: Novolog 6 units TID with meals (hold if pt eats <50% of meal)      --Will follow patient during hospitalization--  Ambrose Finland RN, MSN, CDE Diabetes Coordinator Inpatient Glycemic Control Team Team Pager: [phone] (8a-5p)

## 2016-06-01 NOTE — Progress Notes (Signed)
[facility] [place] [facility]   PATIENT NAME: [name]    MR#:  [id number]  DATE OF BIRTH:  March 15, 1945  SUBJECTIVE:  CHIEF COMPLAINT:   Chief Complaint  Patient presents with  . Weakness   Better shortness of breath and generalized weakness. On O2 [place] 2L. REVIEW OF SYSTEMS:  Review of Systems  Constitutional: Positive for malaise/fatigue. Negative for chills and fever.  HENT: Negative for congestion.   Eyes: Negative for blurred vision and double vision.  Respiratory: Positive for shortness of breath. Negative for cough, hemoptysis, sputum production, wheezing and stridor.   Cardiovascular: Negative for chest pain, palpitations and leg swelling.  Gastrointestinal: Negative for abdominal pain, blood in stool, diarrhea, melena, nausea and vomiting.  Genitourinary: Negative for dysuria and hematuria.  Musculoskeletal: Negative for back pain.  Neurological: Positive for weakness. Negative for dizziness, focal weakness, loss of consciousness and headaches.  Psychiatric/Behavioral: Negative for depression. The patient is not nervous/anxious.     DRUG ALLERGIES:   Allergies  Allergen Reactions  . Codeine Other (See Comments)    Reaction:  Dizziness   . Liraglutide     Other reaction(s): nausea and stomach pain   VITALS:  Blood pressure (!) 102/41, pulse 79, temperature 97.7 F (36.5 C), resp. rate 18, height 5\' 2"  (1.575 m), weight 234 lb 12.6 oz (106.5 kg), SpO2 98 %. PHYSICAL EXAMINATION:  Physical Exam  Constitutional: She is oriented to person, place, and time and well-developed, well-nourished, and in no distress.  HENT:  Head: Normocephalic.  Mouth/Throat: Oropharynx is clear and moist.  Eyes: Conjunctivae and EOM are normal.  Neck: Normal range of motion. Neck supple. No JVD present. No tracheal deviation present. No thyromegaly present.  Cardiovascular: Normal rate and regular rhythm.  Exam reveals no gallop.   Murmur heard. Systolic  murmur 3/6  Pulmonary/Chest: Effort normal. No respiratory distress. She has no wheezes. She has rales.  Abdominal: Soft. Bowel sounds are normal. She exhibits no distension. There is no tenderness.  Musculoskeletal: Normal range of motion. She exhibits no edema or tenderness.  Neurological: She is alert and oriented to person, place, and time. No cranial nerve deficit.  Skin: No rash noted. No erythema.  Psychiatric: Affect normal.   LABORATORY PANEL:  Female CBC  Recent Labs Lab 05/31/16 0447 06/01/16 0500  WBC 10.8  --   HGB 8.8* 9.1*  HCT 26.7*  --   PLT 142*  --    ------------------------------------------------------------------------------------------------------------------ Chemistries   Recent Labs Lab 05/30/16 1905  06/01/16 0500  NA 132*  < > 136  K 2.6*  < > 3.5  CL 83*  < > 89*  CO2 38*  < > 39*  GLUCOSE 397*  < > 242*  BUN 12  < > 11  CREATININE 1.01*  < > 0.55  CALCIUM 8.4*  < > 8.1*  MG  --   < > 1.8  AST 44*  --   --   ALT 32  --   --   ALKPHOS 147*  --   --   BILITOT 2.5*  --   --   < > = values in this interval not displayed. RADIOLOGY:  No results found. ASSESSMENT AND PLAN:   71 year old female with known history of anemia, asthma, diastolic CHF, CVA, diabetes, gastric reflux, presents to the emergency department with generalized weakness.   * Acute on chronic diastolic CHF, NYHA class III Continue IV Lasix 40 twice a day Systolic function was  normal. The estimated ejection fraction was in the range of 60% to 65% per echo. Per Dr. Lady Gary, cardiology consultation, agree with careful diuresis follow renal function. Will need k repleated. Will consider addition of low dose beta blockers. Would not increase lisinopril due to fixed cardiac output.  * Aortic stenosis. Per Dr. Lady Gary, her aortic stenosis is likely playing a role but does not have critical stenosis at present and would not proceed with aortic valve replacement at present.  * Severe  hypokalemia. Improved. Continue potassium supplement and follow-up BMP. magnesium is normal.  * UTI - Based on UA - Started Rocephin, follow-up urine culture: KLEBSIELLA PNEUMONIAE  * HTN. Controlled - continue medications  * Diabetes Lantus insulin to 20 units qhs (fasting blood sugars remain elevated) Consider adding Novolog 6 units tid with meals (hold if she eats less than 50%)  Anemia of chronic disease. Stable.  * Recurrent falls and generalized weakness - PT evaluation suggest skilled nursing facility placement.  All the records are reviewed and case discussed with Care Management/Social Worker. Management plans discussed with the patient, family and they are in agreement.  CODE STATUS: Full Code  TOTAL TIME TAKING CARE OF THIS PATIENT: 33 minutes.   More than 50% of the time was spent in counseling/coordination of care: YES  POSSIBLE D/C IN 1-2 DAYS, DEPENDING ON CLINICAL CONDITION.   Shaune Pollack M.D on 06/01/2016 at 4:26 PM  Between 7am to 6pm - Pager - [phone]  After 6pm go to [email or web address] - Social research officer, government  [facility] [place]  Office  [phone]  CC: Primary care physician; Rennie Plowman, FNP  Note: This dictation was prepared with Dragon dictation along with smaller phrase technology. Any transcriptional errors that result from this process are unintentional.

## 2016-06-01 NOTE — [place] FL2 (Signed)
[place] MEDICAID FL2 LEVEL OF CARE SCREENING TOOL     IDENTIFICATION  Patient Name: [name] Birthdate: 05-Apr-1945 Sex: female Admission Date (Current Location): 05/30/2016  [place] and [place] Number:  Chiropodist and Address:  [facility], [address], [place], [place] [address]      Provider Number: [id number]  Attending Physician Name and Address:  Shaune Pollack, MD  Relative Name and Phone Number:  Courtney Paris Daughter   [phone] or [place], [place] [phone]     Current Level of Care: Hospital Recommended Level of Care: Skilled Nursing Facility Prior Approval Number:    Date Approved/Denied:   PASRR Number: [id number]  Discharge Plan: SNF    Current Diagnoses: Patient Active Problem List   Diagnosis Date Noted  . Pain of upper abdomen 03/10/2016  . Morbid obesity (HCC) 02/02/2016  . HTN (hypertension) 01/06/2016  . Routine physical examination 12/30/2015  . Recurrent falls 12/13/2015  . Fatty liver 12/10/2015  . Fracture of multiple ribs 11/25/2015  . B12 deficiency anemia 11/03/2015  . GERD (gastroesophageal reflux disease) 10/17/2015  . Pure hypercholesterolemia 10/17/2015  . Type 2 diabetes mellitus (HCC) 10/05/2015  . Depression 10/05/2015  . 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 CHF (congestive heart failure) (HCC) 03/13/2015  . Carotid stenosis 07/24/2014    Orientation RESPIRATION BLADDER Height & Weight     Time, Self, Situation, Place  O2 (2L) Continent Weight: 234 lb 12.6 oz (106.5 kg) Height:  5\' 2"  (157.5 cm)  BEHAVIORAL SYMPTOMS/MOOD NEUROLOGICAL BOWEL NUTRITION STATUS      Continent Diet (Heart Healthy Carb Modified)  AMBULATORY STATUS COMMUNICATION OF NEEDS Skin   Limited Assist Verbally Normal                       Personal Care Assistance Level of Assistance  Bathing, Feeding, Dressing Bathing Assistance: Limited assistance Feeding assistance: Limited  assistance Dressing Assistance: Limited assistance     Functional Limitations Info  Sight, Speech, Hearing Sight Info: Adequate Hearing Info: Adequate Speech Info: Adequate    SPECIAL CARE FACTORS FREQUENCY  PT (By licensed PT), OT (By licensed OT)     PT Frequency: 5x a week OT Frequency: 5x a week            Contractures Contractures Info: Not present    Additional Factors Info  Code Status, Allergies, Insulin Sliding Scale Code Status Info: Full Code Allergies Info: CODEINE, LIRAGLUTIDE   Insulin Sliding Scale Info: insulin aspart (novoLOG) injection 0-9 Units 3x a day with meals       Current Medications (06/01/2016):  This is the current hospital active medication list Current Facility-Administered Medications  Medication Dose Route Frequency Provider Last Rate Last Dose  . 0.9 %  sodium chloride infusion  250 mL Intravenous PRN 08/01/2016, MD      . acetaminophen (TYLENOL) tablet 650 mg  650 mg Oral Q6H PRN Vipul Delfino Lovett, MD      . albuterol (PROVENTIL) (2.5 MG/3ML) 0.083% nebulizer solution 2.5 mg  2.5 mg Nebulization Q4H PRN Sherryll Burger, MD      . aspirin EC tablet 81 mg  81 mg Oral Daily Delfino Lovett, MD   81 mg at 06/01/16 0917  . bisacodyl (DULCOLAX) EC tablet 5 mg  5 mg Oral Daily PRN 08/01/16, MD      . cefTRIAXone (ROCEPHIN) 1 g in dextrose 5 % 50 mL IVPB  1 g Intravenous Q24H Delfino Lovett, MD  1 g at 05/31/16 1723  . cyanocobalamin ((VITAMIN B-12)) injection 1,000 mcg  1,000 mcg Intramuscular Q30 days Delfino Lovett, MD   1,000 mcg at 05/31/16 0843  . docusate sodium (COLACE) capsule 100 mg  100 mg Oral BID Delfino Lovett, MD   100 mg at 06/01/16 0917  . ferrous sulfate tablet 325 mg  325 mg Oral Q breakfast Delfino Lovett, MD   325 mg at 06/01/16 0917  . furosemide (LASIX) injection 40 mg  40 mg Intravenous BID Delfino Lovett, MD   40 mg at 06/01/16 0918  . gabapentin (NEURONTIN) capsule 800 mg  800 mg Oral BID Delfino Lovett, MD   800 mg at 06/01/16 0917  . heparin injection 5,000  Units  5,000 Units Subcutaneous Q8H Vipul Shah, MD   5,000 Units at 06/01/16 1441  . hydrochlorothiazide (HYDRODIURIL) tablet 12.5 mg  12.5 mg Oral BID Delfino Lovett, MD   12.5 mg at 06/01/16 0917  . insulin aspart (novoLOG) injection 0-5 Units  0-5 Units Subcutaneous QHS Delfino Lovett, MD   2 Units at 05/31/16 2215  . insulin aspart (novoLOG) injection 0-9 Units  0-9 Units Subcutaneous TID WC Delfino Lovett, MD   3 Units at 06/01/16 1205  . insulin aspart (novoLOG) injection 6 Units  6 Units Subcutaneous TID WC Shaune Pollack, MD      . insulin glargine (LANTUS) injection 20 Units  20 Units Subcutaneous QHS Shaune Pollack, MD      . ondansetron [facility]) tablet 4 mg  4 mg Oral Q6H PRN Delfino Lovett, MD       Or  . ondansetron (ZOFRAN) injection 4 mg  4 mg Intravenous Q6H PRN Delfino Lovett, MD      . pantoprazole (PROTONIX) EC tablet 40 mg  40 mg Oral Daily Delfino Lovett, MD   40 mg at 06/01/16 0917  . potassium chloride SA (K-DUR,KLOR-CON) CR tablet 40 mEq  40 mEq Oral BID Delfino Lovett, MD   40 mEq at 06/01/16 0917  . pravastatin (PRAVACHOL) tablet 10 mg  10 mg Oral q1800 Delfino Lovett, MD   10 mg at 05/31/16 1723  . ramipril (ALTACE) capsule 2.5 mg  2.5 mg Oral Daily Delfino Lovett, MD   2.5 mg at 06/01/16 0918  . sodium chloride flush (NS) 0.9 % injection 3 mL  3 mL Intravenous Q12H Delfino Lovett, MD   3 mL at 06/01/16 0918  . sodium chloride flush (NS) 0.9 % injection 3 mL  3 mL Intravenous PRN Delfino Lovett, MD      . traZODone (DESYREL) tablet 25 mg  25 mg Oral QHS PRN Delfino Lovett, MD         Discharge Medications: Please see discharge summary for a list of discharge medications.  Relevant Imaging Results:  Relevant Lab Results:   Additional Information SSN [id number]  Darleene Cleaver, [place]

## 2016-06-01 NOTE — Clinical Social Work Note (Signed)
Clinical Social Work Assessment  Patient Details  Name: [name] MRN: [id number] Date of Birth: 1945/10/04  Date of referral:  06/01/16               Reason for consult:  Facility Placement                Permission sought to share information with:  Family Supports, Magazine features editor Permission granted to share information::  Yes, Verbal Permission Granted  Name::     Courtney Paris Daughter   [phone] or [place], [place] [phone]   Agency::  SNF admissions  Relationship::     Contact Information:     Housing/Transportation Living arrangements for the past 2 months:  Single Family Home Source of Information:  Patient, Adult Children Patient Interpreter Needed:  None Criminal Activity/Legal Involvement Pertinent to Current Situation/Hospitalization:  No - Comment as needed Significant Relationships:  Adult Children Lives with:  Self Do you feel safe going back to the place where you live?  No Need for family participation in patient care:  No (Coment)  Care giving concerns:  Patient and family feel she needs some short term rehab before she is able to return back home.   Social Worker assessment / plan:  Patient is a 71 year old alert and oriented x4 widowed female who lives alone, but has 24 hour caregivers which she pays privately for.  Patient has been to rehab in the past and is familiar with the process for looking for SNF placement.  Patient was explained how insurance will pay for her stay and also reminded what to expect at SNF for rehab.  Patient and daughter did not express any other issues or concerns and gave CSW permission to begin bed search process.  Employment status:  Retired Database administrator PT Recommendations:  Skilled Nursing Facility Information / Referral to community resources:  Skilled Nursing Facility  Patient/Family's Response to care:  Patient and family in agreement to going to SNF for short term  rehab.  Patient/Family's Understanding of and Emotional Response to Diagnosis, Current Treatment, and Prognosis:  Patient is aware of current treatment plan and prognosis, and she is hopeful she will not have to be in SNF for very long.  Emotional Assessment Appearance:    Attitude/Demeanor/Rapport:    Affect (typically observed):  Appropriate, Calm, Pleasant Orientation:  Oriented to Self, Oriented to Place, Oriented to  Time, Oriented to Situation Alcohol / Substance use:  Not Applicable Psych involvement (Current and /or in the community):  No (Comment)  Discharge Needs  Concerns to be addressed:  Lack of Support Readmission within the last 30 days:  No Current discharge risk:  Lack of support system, Lives alone Barriers to Discharge:  Insurance Authorization   [place] Constable 06/01/2016, 5:20 PM

## 2016-06-01 NOTE — Evaluation (Signed)
Physical Therapy Evaluation Patient Details Name: [name] MRN: [id number] DOB: [birth year] Today's Date: 06/01/2016   History of Present Illness  71 yo female with onset of CHF and low K+ (repleted) has been referred to PT.  Has PMHx:  CVA, CHF, PVD, aortic stenosis  Clinical Impression  Pt was assessed for mobility today and noted her difficulty with moving with O2 sat on supplemented O2 at 90% when resting, down to 83% bedside and floated around 87% finally.  Her inability to maintain sats on O2 cannula led PT to return her to bed, although her chair is ready if more stable later for nursing.  Will need mechanical lift help due to trunk weakness and poor control of her sitting balance as well as medical decline with mobility.  SNF recommended and will remain on acute caseload to work on transfers and strengthening as she tolerates.    Follow Up Recommendations SNF    Equipment Recommendations  None recommended by PT    Recommendations for Other Services       Precautions / Restrictions Precautions Precautions: Fall (telemetry) Precaution Comments: O2 sats low with rest and mobility Restrictions Weight Bearing Restrictions: No      Mobility  Bed Mobility Overal bed mobility: Needs Assistance Bed Mobility: Supine to Sit;Sit to Supine     Supine to sit: Max assist Sit to supine: Total assist   General bed mobility comments: pt is too weak to control sitting balance, side sitting edge of bed  Transfers Overall transfer level: Needs assistance               General transfer comment: could not stand her due to extremely low O2 sats, down to 83% once sitting  Ambulation/Gait             General Gait Details: unable  Stairs            Wheelchair Mobility    Modified Rankin (Stroke Patients Only)       Balance Overall balance assessment: Needs assistance Sitting-balance support: Feet supported Sitting balance-Leahy Scale: Poor                                       Pertinent Vitals/Pain Pain Assessment: No/denies pain    Home Living Family/patient expects to be discharged to:: Skilled nursing facility                      Prior Function Level of Independence: Needs assistance   Gait / Transfers Assistance Needed: rollator and wc at baseline home with caregivers  ADL's / Homemaking Assistance Needed: Pt. has assistance approx. 12hr/daily to assist with ADL's         Hand Dominance        Extremity/Trunk Assessment   Upper Extremity Assessment Upper Extremity Assessment: Generalized weakness    Lower Extremity Assessment Lower Extremity Assessment: Generalized weakness    Cervical / Trunk Assessment Cervical / Trunk Assessment: Kyphotic  Communication   Communication: No difficulties  Cognition Arousal/Alertness: Awake/alert Behavior During Therapy: WFL for tasks assessed/performed Overall Cognitive Status: Within Functional Limits for tasks assessed                                        General Comments      Exercises  Assessment/Plan    PT Assessment Patient needs continued PT services  PT Problem List Decreased strength;Decreased range of motion;Decreased activity tolerance;Decreased balance;Decreased mobility;Decreased coordination;Decreased safety awareness;Cardiopulmonary status limiting activity;Obesity;Decreased skin integrity       PT Treatment Interventions DME instruction;Gait training;Functional mobility training;Therapeutic activities;Therapeutic exercise;Balance training;Neuromuscular re-education;Patient/family education    PT Goals (Current goals can be found in the Care Plan section)  Acute Rehab PT Goals Patient Stated Goal: to get home ASAP PT Goal Formulation: With patient Time For Goal Achievement: 06/15/16 Potential to Achieve Goals: Good    Frequency Min 2X/week   Barriers to discharge Decreased caregiver support (one  caregiver at a time and is dependent for mobility)      Co-evaluation               End of Session Equipment Utilized During Treatment: Oxygen Activity Tolerance: Patient limited by fatigue;Treatment limited secondary to medical complications (Comment);Other (comment) (low O2 sats with rest and effort) Patient left: in bed;with call bell/phone within reach;with bed alarm set Nurse Communication: Mobility status;Other (comment) (O2 sats dropping with activity and will need lift with nurse) PT Visit Diagnosis: Muscle weakness (generalized) (M62.81);Other abnormalities of gait and mobility (R26.89)    Time: 2119-4174 PT Time Calculation (min) (ACUTE ONLY): 28 min   Charges:   PT Evaluation $PT Eval Moderate Complexity: 1 Procedure PT Treatments $Therapeutic Activity: 8-22 mins   PT G Codes:   PT G-Codes **NOT FOR INPATIENT CLASS** Functional Assessment Tool Used: AM-PAC 6 Clicks Basic Mobility     Ivar Drape 06/01/2016, 12:22 PM   Samul Dada, PT MS Acute Rehab Dept. Number: [facility] [id number] and [facility] [phone]

## 2016-06-01 NOTE — Clinical Social Work Placement (Addendum)
   CLINICAL SOCIAL WORK PLACEMENT  NOTE  Date:  06/01/2016  Patient Details  Name: RADA ZEGERS MRN: [id number] Date of Birth: June 10, 1945  Clinical Social Work is seeking post-discharge placement for this patient at the   level of care (*CSW will initial, date and re-position this form in  chart as items are completed):      Patient/family provided with [facility] Clinical Social Work Department's list of facilities offering this level of care within the geographic area requested by the patient (or if unable, by the patient's family).  Yes   Patient/family informed of their freedom to choose among providers that offer the needed level of care, that participate in Medicare, Medicaid or managed care program needed by the patient, have an available bed and are willing to accept the patient.  Yes   Patient/family informed of [place]'s ownership interest in [facility] and [facility], as well as of the fact that they are under no obligation to receive care at these facilities.  PASRR submitted to EDS on 06/01/16     PASRR number received on 06/01/16     Existing PASRR number confirmed on 06/01/16     FL2 transmitted to all facilities in geographic area requested by pt/family on       FL2 transmitted to all facilities within larger geographic area on       Patient informed that his/her managed care company has contracts with or will negotiate with certain facilities, including the following:         06-02-16   Patient/family informed of bed offers received. (updated Windell Moulding, MSW, [place], 06-02-16)  Patient chooses bed at  [facility] (updated Windell Moulding, MSW, [place], 06-02-16)    Physician recommends and patient chooses bed at      Patient to be transferred to  [facility] on  06-02-16 (updated Windell Moulding, MSW, [place], 06-02-16)  Patient to be transferred to facility by  [facility] (updated Windell Moulding, MSW, [place], 06-02-16)     Patient family notified  on 06-02-16 of transfer.(updated Windell Moulding, MSW, [place], 06-02-16)  Name of family member notified:   Courtney Paris patient's daughter (updated Windell Moulding, MSW, LCSWA, 06-02-16)    PHYSICIAN Please sign FL2     Additional Comment:    _______________________________________________ Darleene Cleaver, LCSWA 06/01/2016, 5:34 PM

## 2016-06-02 ENCOUNTER — Ambulatory Visit: Payer: Medicare Other

## 2016-06-02 LAB — BASIC METABOLIC PANEL
Anion gap: 7 (ref 5–15)
BUN: 11 mg/dL (ref 6–20)
CALCIUM: 8.2 mg/dL — AB (ref 8.9–10.3)
CO2: 38 mmol/L — ABNORMAL HIGH (ref 22–32)
Chloride: 89 mmol/L — ABNORMAL LOW (ref 101–111)
Creatinine, Ser: 0.7 mg/dL (ref 0.44–1.00)
GFR calc Af Amer: >60 mL/min (ref >60)
GLUCOSE: 164 mg/dL — AB (ref 65–99)
Potassium: 3.2 mmol/L — ABNORMAL LOW (ref 3.5–5.1)
SODIUM: 134 mmol/L — AB (ref 135–145)

## 2016-06-02 LAB — URINE CULTURE: Culture: >=100000 — AB

## 2016-06-02 LAB — GLUCOSE, CAPILLARY
GLUCOSE-CAPILLARY: 158 mg/dL — AB (ref 65–99)
Glucose-Capillary: 164 mg/dL — ABNORMAL HIGH (ref 65–99)
Glucose-Capillary: 286 mg/dL — ABNORMAL HIGH (ref 65–99)

## 2016-06-02 MED ORDER — CEPHALEXIN 500 MG PO CAPS
500.0000 mg | ORAL_CAPSULE | Freq: Three times a day (TID) | ORAL | Status: DC
  Administered 2016-06-02: 500 mg via ORAL
  Filled 2016-06-02: qty 1

## 2016-06-02 MED ORDER — CEPHALEXIN 500 MG PO CAPS: 500.0000 mg | ORAL_CAPSULE | Freq: Three times a day (TID) | ORAL | 0 refills | Status: DC

## 2016-06-02 MED ORDER — POTASSIUM CHLORIDE CRYS ER 20 MEQ PO TBCR: 40.0000 meq | EXTENDED_RELEASE_TABLET | Freq: Two times a day (BID) | ORAL | 0 refills | Status: AC

## 2016-06-02 MED ORDER — INSULIN GLARGINE 100 UNIT/ML ~~LOC~~ SOLN: 20.0000 [IU] | Freq: Every day | SUBCUTANEOUS | 11 refills | Status: AC

## 2016-06-02 MED ORDER — INSULIN ASPART 100 UNIT/ML ~~LOC~~ SOLN: 6.0000 [IU] | Freq: Three times a day (TID) | SUBCUTANEOUS | 11 refills | Status: DC

## 2016-06-02 MED ORDER — INSULIN ASPART 100 UNIT/ML ~~LOC~~ SOLN: 0.0000 [IU] | Freq: Three times a day (TID) | SUBCUTANEOUS | 11 refills | Status: DC

## 2016-06-02 MED ORDER — RAMIPRIL 2.5 MG PO CAPS: 2.5000 mg | ORAL_CAPSULE | Freq: Every day | ORAL | Status: AC

## 2016-06-02 NOTE — Discharge Instructions (Signed)
Heart Failure Clinic appointment on June 13, 2016 at 11:00am with Clarisa Kindred, FNP. Please call [phone] to reschedule. Heart healthy diet. Fall precaution.  KCl 40 mEq po bid for 7 days, follow up BMP to adjust or discontinue KCl.

## 2016-06-02 NOTE — Care Management Important Message (Signed)
Important Message  Patient Details  Name: [name] MRN: [id number] Date of Birth: Jun 30, 1945   Medicare Important Message Given:  Yes    Eber Hong, RN 06/02/2016, 11:48 AM

## 2016-06-02 NOTE — Clinical Social Work Note (Signed)
CSW received phone call from [place] at [facility], [phone] ext. [phone] insurance approval for patient, authorization number is [id number], for patient to go to [facility] for short term rehab.    Patient to be d/c'ed today to [facility].  Patient and family agreeable to plans will transport via ems RN to call report [phone].  Windell Moulding, MSW, [place]ia Majors [phone]

## 2016-06-02 NOTE — Progress Notes (Signed)
Inpatient Diabetes Program Recommendations  AACE/ADA: New Consensus Statement on Inpatient Glycemic Control (2015)  Target Ranges:  Prepandial:   less than 140 mg/dL      Peak postprandial:   less than 180 mg/dL (1-2 hours)      Critically ill patients:  140 - 180 mg/dL   Lab Results  Component Value Date   GLUCAP 164 (H) 06/02/2016   HGBA1C 5.5 05/30/2016    Review of Glycemic Control  Results for [name], [name] (MRN [id number]) as of 06/02/2016 12:34  Ref. Range 06/01/2016 11:55 06/01/2016 17:00 06/01/2016 21:00 06/02/2016 07:41 06/02/2016 11:51  Glucose-Capillary Latest Ref Range: 65 - 99 mg/dL 979 (H) 892 (H) 119 (H) 158 (H) 164 (H)   Diabetes history: Type 2 Outpatient Diabetes medications: Lantus 15 unit qhs, Humalog 9 units tid with meal if glucose > 150mg /dl  Current orders for Inpatient glycemic control: Lantus 20 units qhs, Novolog 0-9 units tid, Novolog 0-5 units qhs, Novolog 6 units tid  Inpatient Diabetes Program Recommendations:Agree with current medications for blood sugar management.   , RN, BA, MHA, CDE Diabetes Coordinator Inpatient Diabetes Program  [phone] (Team Pager) [phone] [facility] [place] Office) 06/02/2016 12:36 PM

## 2016-06-02 NOTE — Discharge Summary (Signed)
[facility] [place] NAME: [name]    MR#:  [id number]  DATE OF BIRTH:  1946/02/25  DATE OF ADMISSION:  05/30/2016   ADMITTING PHYSICIAN: Max Sane, MD  DATE OF DISCHARGE: 06/02/2016 PRIMARY CARE PHYSICIAN: Mable Paris, FNP   ADMISSION DIAGNOSIS:  Weakness [R53.1] Hypoxia [R09.02] Urinary tract infection without hematuria, site unspecified [N39.0] Acute on chronic congestive heart failure, unspecified congestive heart failure type ([place]) [I50.9] DISCHARGE DIAGNOSIS:  Active Problems:   CHF (congestive heart failure) (HCC) Acute on chronic diastolic CHF, NYHA class III Aortic stenosis. Severe hypokalemia UTI SECONDARY DIAGNOSIS:   Past Medical History:  Diagnosis Date  . Anemia   . Asthma   . CHF (congestive heart failure) ([place])   . CVA (cerebral vascular accident) ([place])   . Diabetes mellitus without complication ([place])   . GERD (gastroesophageal reflux disease)   . Heart murmur   . History of hiatal hernia   . Hypertension   . Panic attacks   . Peripheral vascular disease ([place])   . Restless leg syndrome   . Shortness of breath dyspnea    HOSPITAL COURSE:   71 year old female with known history of anemia, asthma, diastolic CHF, CVA, diabetes, gastric reflux, presents to the emergency department with generalized weakness.   * Acute on chronic diastolic CHF, NYHA class III She is treated with iv Lasix 40 twice a day. Change back to torsemide 40 mg po bid. Systolic function was normal. The estimated ejection fraction was in the range of 60% to 65% per echo. Per Dr. Fath,cardiology consultation, agree with careful diuresis follow renal function. Will need k repleated. Will consider addition of low dose beta blockers. Would not increase lisinopril due to fixed cardiac output.  * Aortic stenosis. Per Dr. Ubaldo Glassing, her aortic stenosis is likely playing a role but does not have critical stenosis at present and would not  proceed with aortic valve replacement at present.  * Severe hypokalemia. Improved. She is treated with both iv and po potassium, K level is improving. continue KCl supplement for 7 days,  follow up BMP to adjust or discontinue KCl. magnesium is normal.  * UTI - Based on UA - Started Rocephin, follow-up urine culture: KLEBSIELLA PNEUMONIAE, change to po kefelx for 3 more days.  *HTN. Controlled - continue medications  *Diabetes Lantus insulin to 20 units qhs (fasting blood sugars remain elevated) Novolog 6 units tid with meals (hold if she eats less than 50%)  Anemia of chronic disease. Stable.  *Recurrent falls and generalized weakness - PT evaluation suggest skilled nursing facility placement.  DISCHARGE CONDITIONS:  Stable, discharge to SNF today. CONSULTS OBTAINED:  Treatment Team:  Teodoro Spray, MD DRUG ALLERGIES:   Allergies  Allergen Reactions  . Codeine Other (See Comments)    Reaction:  Dizziness   . Liraglutide     Other reaction(s): nausea and stomach pain   DISCHARGE MEDICATIONS:   Allergies as of 06/02/2016      Reactions   Codeine Other (See Comments)   Reaction:  Dizziness    Liraglutide    Other reaction(s): nausea and stomach pain      Medication List    STOP taking these medications   insulin lispro 100 UNIT/ML KiwkPen Commonly known as:  HUMALOG KWIKPEN   potassium chloride 10 MEQ tablet Commonly known as:  K-DUR   predniSONE 10 MG tablet Commonly known as:  DELTASONE   traMADol 50 MG tablet Commonly known as:  ULTRAM     TAKE these medications   acetaminophen 325 MG tablet Commonly known as:  TYLENOL Take 2 tablets (650 mg total) by mouth every 6 (six) hours as needed for mild pain (or Fever >/= 101).   albuterol (2.5 MG/3ML) 0.083% nebulizer solution Commonly known as:  PROVENTIL Take 3 mLs (2.5 mg total) by nebulization every 4 (four) hours as needed for wheezing.   aspirin EC 81 MG tablet Take 81 mg by mouth  daily.   B-12 1000 MCG/ML Kit Inject 1,000 mcg as directed every 30 (thirty) days.   cephALEXin 500 MG capsule Commonly known as:  KEFLEX Take 1 capsule (500 mg total) by mouth every 8 (eight) hours.   dapsone 25 MG tablet Take 150 mg by mouth daily. 1.5 tablets daily   ferrous sulfate 325 (65 FE) MG tablet Take 1 tablet (325 mg total) by mouth daily with breakfast.   folic acid 1 MG tablet Commonly known as:  FOLVITE Take 1 mg by mouth daily. Take one pill daily on days that not taking methotrexate   gabapentin 800 MG tablet Commonly known as:  NEURONTIN Take 800 mg by mouth 2 (two) times daily.   hydrochlorothiazide 12.5 MG tablet Commonly known as:  HYDRODIURIL Take 12.5 mg by mouth 2 (two) times daily.   insulin aspart 100 UNIT/ML injection Commonly known as:  novoLOG Inject 0-9 Units into the skin 3 (three) times daily with meals.   insulin aspart 100 UNIT/ML injection Commonly known as:  novoLOG Inject 6 Units into the skin 3 (three) times daily with meals.   insulin glargine 100 UNIT/ML injection Commonly known as:  LANTUS Inject 0.2 mLs (20 Units total) into the skin at bedtime. What changed:  how much to take   lovastatin 20 MG tablet Commonly known as:  MEVACOR Take 20 mg by mouth at bedtime.   methotrexate 2.5 MG tablet Commonly known as:  RHEUMATREX Take 10 mg by mouth once a week. Caution:Chemotherapy. Protect from light.  Take 4 tablets by mouth weekly   omeprazole 20 MG capsule Commonly known as:  PRILOSEC Take 1 capsule (20 mg total) by mouth at bedtime.   potassium chloride SA 20 MEQ tablet Commonly known as:  K-DUR,KLOR-CON Take 2 tablets (40 mEq total) by mouth 2 (two) times daily.   ramipril 2.5 MG capsule Commonly known as:  ALTACE Take 1 capsule (2.5 mg total) by mouth daily. Start taking on:  06/03/2016   SILVASORB Gel Apply 1 application topically 2 (two) times daily as needed.   torsemide 20 MG tablet Commonly known as:   DEMADEX Take 2 tablets (40 mg total) by mouth 2 (two) times daily.   Vitamin D3 2000 units capsule Take by mouth.        DISCHARGE INSTRUCTIONS:  See AVS. If you experience worsening of your admission symptoms, develop shortness of breath, life threatening emergency, suicidal or homicidal thoughts you must seek medical attention immediately by calling 911 or calling your MD immediately  if symptoms less severe.  You Must read complete instructions/literature along with all the possible adverse reactions/side effects for all the Medicines you take and that have been prescribed to you. Take any new Medicines after you have completely understood and accpet all the possible adverse reactions/side effects.   Please note  You were cared for by a hospitalist during your hospital stay. If you have any questions about your discharge medications or the care you received while you were in the hospital after  you are discharged, you can call the unit and asked to speak with the hospitalist on call if the hospitalist that took care of you is not available. Once you are discharged, your primary care physician will handle any further medical issues. Please note that NO REFILLS for any discharge medications will be authorized once you are discharged, as it is imperative that you return to your primary care physician (or establish a relationship with a primary care physician if you do not have one) for your aftercare needs so that they can reassess your need for medications and monitor your lab values.    On the day of Discharge:  VITAL SIGNS:  Blood pressure (!) 126/52, pulse 83, temperature 98.4 F (36.9 C), temperature source Oral, resp. rate 14, height 5' 2"  (1.575 m), weight 233 lb 0.4 oz (105.7 kg), SpO2 97 %. PHYSICAL EXAMINATION:  GENERAL:  71 y.o.-year-old patient lying in the bed with no acute distress. Obese. EYES: Pupils equal, round, reactive to light and accommodation. No scleral icterus.  Extraocular muscles intact.  HEENT: Head atraumatic, normocephalic. Oropharynx and nasopharynx clear.  NECK:  Supple, no jugular venous distention. No thyroid enlargement, no tenderness.  LUNGS: Normal breath sounds bilaterally, no wheezing, rales,rhonchi or crepitation. No use of accessory muscles of respiration.  CARDIOVASCULAR: S1, S2 normal. Systolic murmurs, no rubs, or gallops.  ABDOMEN: Soft, non-tender, non-distended. Bowel sounds present. No organomegaly or mass.  EXTREMITIES: No pedal edema, cyanosis, or clubbing.  NEUROLOGIC: Cranial nerves II through XII are intact. Muscle strength 4/5 in all extremities. Sensation intact. Gait not checked.  PSYCHIATRIC: The patient is alert and oriented x 3.  SKIN: No obvious rash, lesion, or ulcer.  DATA REVIEW:   CBC  Recent Labs Lab 05/31/16 0447 06/01/16 0500  WBC 10.8  --   HGB 8.8* 9.1*  HCT 26.7*  --   PLT 142*  --     Chemistries   Recent Labs Lab 05/30/16 1905  06/01/16 0500 06/02/16 0451  NA 132*  < > 136 134*  K 2.6*  < > 3.5 3.2*  CL 83*  < > 89* 89*  CO2 38*  < > 39* 38*  GLUCOSE 397*  < > 242* 164*  BUN 12  < > 11 11  CREATININE 1.01*  < > 0.55 0.70  CALCIUM 8.4*  < > 8.1* 8.2*  MG  --   < > 1.8  --   AST 44*  --   --   --   ALT 32  --   --   --   ALKPHOS 147*  --   --   --   BILITOT 2.5*  --   --   --   < > = values in this interval not displayed.   Microbiology Results  Results for orders placed or performed during the hospital encounter of 05/30/16  Urine culture     Status: Abnormal   Collection Time: 05/30/16  7:39 PM  Result Value Ref Range Status   Specimen Description URINE, RANDOM  Final   Special Requests NONE  Final   Culture >=100,000 COLONIES/mL KLEBSIELLA PNEUMONIAE (A)  Final   Report Status 06/02/2016 FINAL  Final   Organism ID, Bacteria KLEBSIELLA PNEUMONIAE (A)  Final      Susceptibility   Klebsiella pneumoniae - MIC*    AMPICILLIN RESISTANT Resistant     CEFAZOLIN <=4 SENSITIVE  Sensitive     CEFTRIAXONE <=1 SENSITIVE Sensitive     CIPROFLOXACIN <=0.25 SENSITIVE  Sensitive     GENTAMICIN <=1 SENSITIVE Sensitive     IMIPENEM <=0.25 SENSITIVE Sensitive     NITROFURANTOIN 32 SENSITIVE Sensitive     TRIMETH/SULFA <=20 SENSITIVE Sensitive     AMPICILLIN/SULBACTAM 4 SENSITIVE Sensitive     PIP/TAZO <=4 SENSITIVE Sensitive     Extended ESBL NEGATIVE Sensitive     * >=100,000 COLONIES/mL KLEBSIELLA PNEUMONIAE    RADIOLOGY:  No results found.   Management plans discussed with the patient, family and they are in agreement.  CODE STATUS: Full Code   TOTAL TIME TAKING CARE OF THIS PATIENT: 36 minutes.    Demetrios Loll M.D on 06/02/2016 at 12:48 PM  Between 7am to 6pm - Pager - [phone]  After 6pm go to [email or web address] - Proofreader  [facility]  Office  [phone]  CC: Primary care physician; Mable Paris, FNP   Note: This dictation was prepared with Dragon dictation along with smaller phrase technology. Any transcriptional errors that result from this process are unintentional.

## 2016-06-02 NOTE — Progress Notes (Signed)
Discharge report called to [facility]/ iv and tele removed/ EMS called to transport

## 2016-06-02 NOTE — Progress Notes (Signed)
EMS arrived to transport pt to SNF/ BP 94/39/ MD made aware / ordered to recheck BP in an hour/ recheck 99/48/ MD made aware/ stated ok to discharge/ EMS called again to transport

## 2016-06-06 ENCOUNTER — Non-Acute Institutional Stay (SKILLED_NURSING_FACILITY): Payer: Medicare Other | Admitting: Internal Medicine

## 2016-06-06 ENCOUNTER — Encounter: Payer: Self-pay | Admitting: Internal Medicine

## 2016-06-06 DIAGNOSIS — B9689 Other specified bacterial agents as the cause of diseases classified elsewhere: Secondary | ICD-10-CM

## 2016-06-06 DIAGNOSIS — Z794 Long term (current) use of insulin: Secondary | ICD-10-CM

## 2016-06-06 DIAGNOSIS — L138 Other specified bullous disorders: Secondary | ICD-10-CM

## 2016-06-06 DIAGNOSIS — I5033 Acute on chronic diastolic (congestive) heart failure: Secondary | ICD-10-CM

## 2016-06-06 DIAGNOSIS — Z8673 Personal history of transient ischemic attack (TIA), and cerebral infarction without residual deficits: Secondary | ICD-10-CM

## 2016-06-06 DIAGNOSIS — E871 Hypo-osmolality and hyponatremia: Secondary | ICD-10-CM

## 2016-06-06 DIAGNOSIS — N390 Urinary tract infection, site not specified: Secondary | ICD-10-CM

## 2016-06-06 DIAGNOSIS — B961 Klebsiella pneumoniae [K. pneumoniae] as the cause of diseases classified elsewhere: Secondary | ICD-10-CM

## 2016-06-06 DIAGNOSIS — K219 Gastro-esophageal reflux disease without esophagitis: Secondary | ICD-10-CM

## 2016-06-06 DIAGNOSIS — E1140 Type 2 diabetes mellitus with diabetic neuropathy, unspecified: Secondary | ICD-10-CM

## 2016-06-06 DIAGNOSIS — T148XXA Other injury of unspecified body region, initial encounter: Secondary | ICD-10-CM

## 2016-06-06 DIAGNOSIS — R945 Abnormal results of liver function studies: Secondary | ICD-10-CM

## 2016-06-06 DIAGNOSIS — I10 Essential (primary) hypertension: Secondary | ICD-10-CM

## 2016-06-06 DIAGNOSIS — R531 Weakness: Secondary | ICD-10-CM

## 2016-06-06 DIAGNOSIS — D638 Anemia in other chronic diseases classified elsewhere: Secondary | ICD-10-CM

## 2016-06-06 DIAGNOSIS — R7989 Other specified abnormal findings of blood chemistry: Secondary | ICD-10-CM

## 2016-06-06 DIAGNOSIS — E876 Hypokalemia: Secondary | ICD-10-CM

## 2016-06-06 NOTE — Progress Notes (Signed)
LOCATION: [place]  PCP: Mable Paris, FNP   Code Status: Full Code  Goals of care: Advanced Directive information Advanced Directives 05/30/2016  Does Patient Have a Medical Advance Directive? Yes  Type of Paramedic of [place];Living will  Does patient want to make changes to medical advance directive? -  Copy of [place] in Chart? -  Would patient like information on creating a medical advance directive? -       Extended Emergency Contact Information Primary Emergency Contact: [place], [place] Phone: [phone] Relation: Son Secondary Emergency Contact: Courtney [place] Mobile Phone: [phone] Relation: Daughter   Allergies  Allergen Reactions  . Codeine Other (See Comments)    Reaction:  Dizziness   . Liraglutide     Other reaction(s): nausea and stomach pain    Chief Complaint  Patient presents with  . New Admit To SNF    New Admission Visit      HPI:  Patient is a 71 y.o. female seen today for short term rehabilitation post hospital admission from 05/30/16-06/02/16 with acute on chronic diastolic chf exacerbation. She received iv diuresis and was switched to po torsemide prior to discharge. She was seen by cardiology. She had low K and required repletion. She was also started on antibiotic for klebsiella UTI and has completed her antibiotics. She has PMH of DM type 2, chronic diastolic CHF, linear IgA bullous dermatosis, anemia, old CVA among others. She is seen in her room today.   Review of Systems:  Constitutional: Negative for fever, chills, diaphoresis. Feels weak and tired. HENT: Negative for headache, congestion, nasal discharge, sore throat, difficulty swallowing.   Eyes: Negative for eye pain, blurred vision, double vision and discharge. Wears glasses. Respiratory: Negative for cough, shortness of breath and wheezing.  currently on o2 by  nasal canula.  Cardiovascular: Negative for chest pain, palpitations.  Gastrointestinal: Negative for heartburn, nausea, vomiting, abdominal pain, loss of appetite, melena. Last bowel movement was yesterday. Genitourinary: Negative for dysuria and flank pain.  Musculoskeletal: Negative for back pain, fall in the facility.  Skin: Negative for itching, rash.  Neurological: Negative for dizziness. Psychiatric/Behavioral: Negative for depression.   Past Medical History:  Diagnosis Date  . Anemia   . Asthma   . CHF (congestive heart failure) ([place])   . CVA (cerebral vascular accident) ([place])   . Diabetes mellitus without complication ([place])   . GERD (gastroesophageal reflux disease)   . Heart murmur   . History of hiatal hernia   . Hypertension   . Panic attacks   . Peripheral vascular disease ([place])   . Restless leg syndrome   . Shortness of breath dyspnea    Past Surgical History:  Procedure Laterality Date  . ABDOMINAL HYSTERECTOMY    . BREAST BIOPSY Right yrs ago   benign  . ENDARTERECTOMY Right 07/24/2014   Procedure: ENDARTERECTOMY CAROTID;  Surgeon: Algernon Huxley, MD;  Location: [facility] ORS;  Service: Vascular;  Laterality: Right;  . EYE SURGERY Left    cataract  . FRACTURE SURGERY Left    fractured ankle  . TONSILLECTOMY     Social History:   reports that she has never smoked. She has never used smokeless tobacco. She reports that she does not drink alcohol or use drugs.  Family History  Problem Relation Age of Onset  . Pancreatic cancer Mother   . CAD Father   . Hypertension Father   .  Pancreatic cancer Father   . Colon cancer Father   . CAD Brother   . Heart disease Brother     Heart attack  . Breast cancer Maternal Aunt     pt states several maternal aunts    Medications: Allergies as of 06/06/2016      Reactions   Codeine Other (See Comments)   Reaction:  Dizziness    Liraglutide    Other reaction(s): nausea and stomach pain      Medication List         Accurate as of 06/06/16 12:01 PM. Always use your most recent med list.          acetaminophen 325 MG tablet Commonly known as:  TYLENOL Take 2 tablets (650 mg total) by mouth every 6 (six) hours as needed for mild pain (or Fever >/= 101).   albuterol (2.5 MG/3ML) 0.083% nebulizer solution Commonly known as:  PROVENTIL Take 3 mLs (2.5 mg total) by nebulization every 4 (four) hours as needed for wheezing.   aspirin EC 81 MG tablet Take 81 mg by mouth daily.   B-12 1000 MCG/ML Kit Inject 1,000 mcg as directed every 30 (thirty) days.   Cholecalciferol 1000 units capsule Take 1,000 Units by mouth daily.   dapsone 25 MG tablet Take 100 mg by mouth daily. Take 150 mg daily per dermatology note   ferrous sulfate 325 (65 FE) MG tablet Take 1 tablet (325 mg total) by mouth daily with breakfast.   folic acid 1 MG tablet Commonly known as:  FOLVITE Take 1 mg by mouth daily. Take one pill daily on Tuesday, Wednesday, Thursday, Friday, Saturday and Sunday. These are days were patient isn't taking methotrexate   gabapentin 800 MG tablet Commonly known as:  NEURONTIN Take 800 mg by mouth 2 (two) times daily.   hydrochlorothiazide 12.5 MG tablet Commonly known as:  HYDRODIURIL Take 12.5 mg by mouth 2 (two) times daily.   insulin aspart 100 UNIT/ML injection Commonly known as:  novoLOG Inject 10 Units into the skin 3 (three) times daily before meals. 301-350+ 10 units, 351 or higher NOTIFY MD   insulin aspart 100 UNIT/ML injection Commonly known as:  novoLOG Inject 6 Units into the skin 3 (three) times daily with meals.   insulin glargine 100 UNIT/ML injection Commonly known as:  LANTUS Inject 0.2 mLs (20 Units total) into the skin at bedtime.   lovastatin 20 MG tablet Commonly known as:  MEVACOR Take 20 mg by mouth at bedtime.   methotrexate 2.5 MG tablet Commonly known as:  RHEUMATREX Take 10 mg by mouth once a week. Caution:Chemotherapy. Protect from light.  Take 4 tablets  by mouth weekly   omeprazole 20 MG capsule Commonly known as:  PRILOSEC Take 1 capsule (20 mg total) by mouth at bedtime.   potassium chloride SA 20 MEQ tablet Commonly known as:  K-DUR,KLOR-CON Take 2 tablets (40 mEq total) by mouth 2 (two) times daily.   ramipril 2.5 MG capsule Commonly known as:  ALTACE Take 1 capsule (2.5 mg total) by mouth daily.   SILVASORB Gel Apply 1 application topically 2 (two) times daily as needed.   torsemide 20 MG tablet Commonly known as:  DEMADEX Take 2 tablets (40 mg total) by mouth 2 (two) times daily.       Immunizations: Immunization History  Administered Date(s) Administered  . Influenza, High Dose Seasonal PF 11/25/2015  . PPD Test 06/02/2016     Physical Exam: Vitals:   06/06/16 1142  BP: (!) 120/59  Pulse: 87  Resp: 16  Temp: 98.6 F (37 C)  TempSrc: Oral  SpO2: 98%  Weight: 238 lb (108 kg)  Height: 5' 2"  (1.575 m)   Body mass index is 43.53 kg/m.  General- elderly female, morbidly obese, in no acute distress Head- normocephalic, atraumatic Nose- no nasal discharge Throat- moist mucus membrane, has dentures Eyes- PERRLA, EOMI, no pallor, no icterus, no discharge, normal conjunctiva, normal sclera Neck- no cervical lymphadenopathy Cardiovascular- normal Y6,H6, + systolic murmur Respiratory- bilateral clear to auscultation, + expiratory wheeze, no rhonchi, no crackles, no use of accessory muscles, on o2 by nasal canula 2l/min Abdomen- bowel sounds present, soft, non tender, no guarding or rigidity Musculoskeletal- able to move all 4 extremities, generalized weakness, no leg edema Neurological- alert and oriented to person, place and time Skin- warm and dry, easy bruising, has skin tears Psychiatry- normal mood and affect    Labs reviewed: Basic Metabolic Panel:  Recent Labs  12/14/15 1402  05/31/16 0447 05/31/16 1446 06/01/16 0500 06/02/16 0451  NA  --   < > 135  --  136 134*  K 3.4*  < > 2.1* 2.8* 3.5  3.2*  CL  --   < > 87*  --  89* 89*  CO2  --   < > 41*  --  39* 38*  GLUCOSE  --   < > 252*  --  242* 164*  BUN  --   < > 10  --  11 11  CREATININE  --   < > 0.78  --  0.55 0.70  CALCIUM  --   < > 8.1*  --  8.1* 8.2*  MG 1.7  --  1.8  --  1.8  --   PHOS  --   --   --   --  3.0  --   < > = values in this interval not displayed. Liver Function Tests:  Recent Labs  03/11/16 1021 04/25/16 1425 05/30/16 1905  AST 33 80* 44*  ALT 19 26 32  ALKPHOS 137* 190* 147*  BILITOT 1.3* 1.9* 2.5*  PROT 5.3* 6.1 5.9*  ALBUMIN 2.6* 2.8* 2.9*    Recent Labs  03/03/16 1318 03/11/16 1021  LIPASE 33 54.0   No results for input(s): AMMONIA in the last 8760 hours. CBC:  Recent Labs  01/05/16 1105  03/11/16 1021 05/30/16 1905 05/31/16 0447 06/01/16 0500  WBC 11.4*  < > 9.7 9.5 10.8  --   NEUTROABS 7.9*  --  5.9 8.2*  --   --   HGB 11.9*  < > 12.3 9.5* 8.8* 9.1*  HCT 37.0  < > 38.3 28.6* 26.7*  --   MCV 82.2  < > 88.4 99.4 98.6  --   PLT 272.0  < > 215.0 152 142*  --   < > = values in this interval not displayed. Cardiac Enzymes:  Recent Labs  11/15/15 1522 12/10/15 1348 05/30/16 1905  TROPONINI 0.03* 0.05* 0.03*   BNP: Invalid input(s): POCBNP CBG:  Recent Labs  06/02/16 0741 06/02/16 1151 06/02/16 1700  GLUCAP 158* 164* 286*    Radiological Exams: Dg Chest Portable 1 View  Result Date: 05/30/2016 CLINICAL DATA:  Weakness starting at 8 a.m. EXAM: PORTABLE CHEST 1 VIEW COMPARISON:  12/10/2015 FINDINGS: Heart is top-normal in size. There are calcifications of the aortic arch consistent with atherosclerosis. Mitral annular calcifications are also present. Bilateral chronic interstitial prominence is noted which may reflect chronic bronchitic change.  No disease tissue edema might also contribute to this appearance. Minimal atelectasis at the left lung base. No pneumothorax nor significant effusion. 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 IMPRESSION: Pulmonary  interstitial prominence which may reflect changes of interstitial edema or possibly chronic bronchitic change. Stable borderline cardiomegaly with aortic atherosclerosis. Electronically Signed   By: Ashley Royalty M.D.   On: 05/30/2016 19:24    Assessment/Plan  Generalized weakness From deconditioning. Will have her work with physical therapy and occupational therapy team to help with gait training and muscle strengthening exercises.fall precautions. Skin care. Encourage to be out of bed.   Acute on chronic diastolic chf EF 00-37%. Stable, monitor weight 3 days a week. Breathing stable. Continue to wean off o2 to room air as tolerated. Continue torsemide 40 mg bid, ramipril 2.5 mg daily. Continue albuterol as needed for dyspnea. Check bmp and BP  Klebsiella UTI Completed course of keflex, denies symptom, maintain hydration and perineal hygiene  Hyponatremia On hctz and torsemide, both could contribute to hyponatremia. Check bmp  Hypokalemia hctz and torsemide likely contributing to this. On kcl 40 meq bid. Check bmp  Anemia of chronic disease On feso4 048 mg daily, folic acid and G89. Check cbc  Deranged LFT Monitor LFT with her on dapsone, methotrexate and tylenol.   HTN Monitor BP, continue hctz 12.5 mg bid with ramipril, torsemide.   Dm type 2 with neuropathy Lab Results  Component Value Date   HGBA1C 5.5 05/30/2016   a1c is suggestive of controlled. DM. cbg mostly in 200s with some reading in 300. Currently on lantus 20 u daily with novolog 6 u tid and additional SSI. Continue gabapentin 800 mg bid  Linear Ig A bullous dermatosis Seen by dermatology. Reviewed last OV. Patient to be on prednisone tapering course from 25 mg daily to 10 mg daily and dapsone 150 mg daily with methotrexate weekly and folic acid 6 days a week per OV note. Currently not on dapsone and prednsione. Start these per recommendation. Skin care. Apply silvasorb bid prn. Make dermatology f/u with Dr Will Bonnet.    Skin tears With her on prednisone, provide skin care with tegaderm dressing, prevent skin tear  History of CVA Continue aspirin and lovastatin  gerd Continue omeprazole and monitor   Goals of care: short term rehabilitation   Labs/tests ordered: cbc, cmp 06/09/16  Family/ staff Communication: reviewed care plan with patient and nursing supervisor  I have spent greater than 50 minutes for this encounter which includes reviewing hospital records, addressing above mentioned concerns, reviewing care plan with patient, answering patient's concerns and counseling her.     Blanchie Serve, MD Internal Medicine [facility] [address] [place], [place] [address] Cell Phone (Monday-Friday 8 am - 5 pm): [phone] On Call: [phone] and follow prompts after 5 pm and on weekends Office Phone: [phone] Office Fax: [phone]

## 2016-06-08 ENCOUNTER — Other Ambulatory Visit: Payer: Self-pay | Admitting: Family

## 2016-06-09 ENCOUNTER — Ambulatory Visit: Payer: Medicare Other | Admitting: Family

## 2016-06-09 LAB — HEPATIC FUNCTION PANEL
ALK PHOS: 118 U/L (ref 25–125)
ALT: 20 U/L (ref 7–35)
AST: 23 U/L (ref 13–35)
Bilirubin, Total: 1 mg/dL

## 2016-06-09 LAB — BASIC METABOLIC PANEL
BUN: 15 mg/dL (ref 4–21)
CREATININE: 0.8 mg/dL (ref 0.5–1.1)
Glucose: 346 mg/dL
Potassium: 3 mmol/L — AB (ref 3.4–5.3)
Sodium: 133 mmol/L — AB (ref 137–147)

## 2016-06-09 LAB — CBC AND DIFFERENTIAL
HCT: 29 % — AB (ref 36–46)
Hemoglobin: 9.3 g/dL — AB (ref 12.0–16.0)
Platelets: 206 10*3/uL (ref 150–399)
WBC: 7.8 10^3/mL

## 2016-06-10 ENCOUNTER — Non-Acute Institutional Stay (SKILLED_NURSING_FACILITY): Payer: Medicare Other | Admitting: Family

## 2016-06-10 ENCOUNTER — Encounter: Payer: Self-pay | Admitting: Family

## 2016-06-10 DIAGNOSIS — E1165 Type 2 diabetes mellitus with hyperglycemia: Secondary | ICD-10-CM | POA: Diagnosis not present

## 2016-06-10 DIAGNOSIS — Z794 Long term (current) use of insulin: Secondary | ICD-10-CM | POA: Diagnosis not present

## 2016-06-10 DIAGNOSIS — E1140 Type 2 diabetes mellitus with diabetic neuropathy, unspecified: Secondary | ICD-10-CM | POA: Diagnosis not present

## 2016-06-10 DIAGNOSIS — E440 Moderate protein-calorie malnutrition: Secondary | ICD-10-CM | POA: Diagnosis not present

## 2016-06-10 DIAGNOSIS — E8809 Other disorders of plasma-protein metabolism, not elsewhere classified: Secondary | ICD-10-CM | POA: Diagnosis not present

## 2016-06-10 DIAGNOSIS — D509 Iron deficiency anemia, unspecified: Secondary | ICD-10-CM | POA: Diagnosis not present

## 2016-06-10 DIAGNOSIS — IMO0002 Reserved for concepts with insufficient information to code with codable children: Secondary | ICD-10-CM

## 2016-06-10 DIAGNOSIS — E871 Hypo-osmolality and hyponatremia: Secondary | ICD-10-CM

## 2016-06-10 DIAGNOSIS — E876 Hypokalemia: Secondary | ICD-10-CM | POA: Diagnosis not present

## 2016-06-10 NOTE — Progress Notes (Signed)
Location:  [facility] Room Number: 2025 Place of Service:  SNF [phone]) Provider:   FNP-C  Mable Paris, FNP  Patient Care Team: Burnard Hawthorne, FNP as PCP - General (Family Medicine) Alisa Graff, FNP as Nurse Practitioner (Family Medicine) Yolonda Kida, MD as Consulting Physician (Cardiology)  Extended Emergency Contact Information Primary Emergency Contact: Eliezer Champagne of [place] Phone: [phone] Relation: Son Secondary Emergency Contact: Cameron of [place] Mobile Phone: [phone] Relation: Daughter  Code Status: Full Code  Goals of care: Advanced Directive information Advanced Directives 06/10/2016  Does Patient Have a Medical Advance Directive? No  Type of Advance Directive -  Does patient want to make changes to medical advance directive? -  Copy of [place] in Chart? -  Would patient like information on creating a medical advance directive? No - Patient declined     Chief Complaint  Patient presents with  . Acute Visit    abnormal labs    HPI:  Pt is a 71 y.o. female seen today at [facility] for an acute visit for evaluation of abnormal lab results. She has a significant medical history of HTN, CHF, type 2 DM with neuropathy, GERD, morbid obesity among other conditions. She is seen in her room today. She denies any acute issues this visit. Her recent lab results showed Na 133, K+ 3.0, TP 5.2, Alb 2.74, Hgb 9.3, HCT 29 ( 06/09/2016). Her CBG's log reviewed CBG's ranging in the 110's- upper 300's with occasional 400's. No signs of Hypo/hyperglycemia reported. She states stays constantly tired.    Past Medical History:  Diagnosis Date  . Anemia   . Asthma   . CHF (congestive heart failure) ([place])   . CVA (cerebral vascular accident) ([place])   . Diabetes mellitus without complication ([place])   . GERD (gastroesophageal reflux  disease)   . Heart murmur   . History of hiatal hernia   . Hypertension   . Panic attacks   . Peripheral vascular disease ([place])   . Restless leg syndrome   . Shortness of breath dyspnea    Past Surgical History:  Procedure Laterality Date  . ABDOMINAL HYSTERECTOMY    . BREAST BIOPSY Right yrs ago   benign  . ENDARTERECTOMY Right 07/24/2014   Procedure: ENDARTERECTOMY CAROTID;  Surgeon: Algernon Huxley, MD;  Location: [facility] ORS;  Service: Vascular;  Laterality: Right;  . EYE SURGERY Left    cataract  . FRACTURE SURGERY Left    fractured ankle  . TONSILLECTOMY      Allergies  Allergen Reactions  . Codeine Other (See Comments)    Reaction:  Dizziness   . Liraglutide     Other reaction(s): nausea and stomach pain    Allergies as of 06/10/2016      Reactions   Codeine Other (See Comments)   Reaction:  Dizziness    Liraglutide    Other reaction(s): nausea and stomach pain      Medication List       Accurate as of 06/10/16 12:32 PM. Always use your most recent med list.          acetaminophen 325 MG tablet Commonly known as:  TYLENOL Take 2 tablets (650 mg total) by mouth every 6 (six) hours as needed for mild pain (or Fever >/= 101).   albuterol (2.5 MG/3ML) 0.083% nebulizer solution Commonly known as:  PROVENTIL Take 3 mLs (2.5 mg  total) by nebulization every 4 (four) hours as needed for wheezing.   aspirin EC 81 MG tablet Take 81 mg by mouth daily.   B-12 1000 MCG/ML Kit Inject 1,000 mcg as directed every 30 (thirty) days.   Cholecalciferol 1000 units capsule Take 1,000 Units by mouth daily.   dapsone 25 MG tablet Take 100 mg by mouth daily. Take 150 mg daily per dermatology note   ferrous sulfate 325 (65 FE) MG tablet Take 1 tablet (325 mg total) by mouth daily with breakfast.   folic acid 1 MG tablet Commonly known as:  FOLVITE Take 1 mg by mouth daily. Take one pill daily on Tuesday, Wednesday, Thursday, Friday, Saturday and Sunday. These are days were  patient isn't taking methotrexate   gabapentin 800 MG tablet Commonly known as:  NEURONTIN Take 800 mg by mouth 2 (two) times daily.   hydrochlorothiazide 12.5 MG tablet Commonly known as:  HYDRODIURIL Take 12.5 mg by mouth 2 (two) times daily.   insulin aspart 100 UNIT/ML injection Commonly known as:  novoLOG Inject 10 Units into the skin 3 (three) times daily before meals. 301-350+ 10 units, 351 or higher NOTIFY MD   insulin aspart 100 UNIT/ML injection Commonly known as:  novoLOG Inject 6 Units into the skin 3 (three) times daily with meals.   insulin glargine 100 UNIT/ML injection Commonly known as:  LANTUS Inject 0.2 mLs (20 Units total) into the skin at bedtime.   lovastatin 20 MG tablet Commonly known as:  MEVACOR Take 20 mg by mouth at bedtime.   methotrexate 2.5 MG tablet Commonly known as:  RHEUMATREX Take 10 mg by mouth once a week. Caution:Chemotherapy. Protect from light.  Take 4 tablets by mouth weekly   MULTIVITAL tablet Take 1 tablet by mouth daily.   omeprazole 20 MG capsule Commonly known as:  PRILOSEC Take 1 capsule (20 mg total) by mouth at bedtime.   potassium chloride SA 20 MEQ tablet Commonly known as:  K-DUR,KLOR-CON Take 2 tablets (40 mEq total) by mouth 2 (two) times daily.   predniSONE 50 MG tablet Commonly known as:  DELTASONE Take 25 mg by mouth daily with breakfast. Take 25 mg daily x1 week, then 20 mg x1 week,, then 15 mg x1 week, then 10 mg daily until seen by dermatology.   ramipril 2.5 MG capsule Commonly known as:  ALTACE Take 1 capsule (2.5 mg total) by mouth daily.   SILVASORB Gel Apply 1 application topically 2 (two) times daily as needed.   torsemide 20 MG tablet Commonly known as:  DEMADEX Take 2 tablets (40 mg total) by mouth 2 (two) times daily.       Review of Systems  Constitutional: Negative for activity change, appetite change, chills, fatigue and fever.  HENT: Negative for congestion, rhinorrhea, sinus pain,  sinus pressure, sneezing and sore throat.   Eyes: Negative.   Respiratory: Negative for cough, chest tightness, shortness of breath and wheezing.   Cardiovascular: Negative for chest pain, palpitations and leg swelling.  Gastrointestinal: Negative for abdominal distention, abdominal pain, constipation, diarrhea, nausea and vomiting.  Endocrine: Negative.   Genitourinary: Negative for dysuria, flank pain, frequency and urgency.  Musculoskeletal: Positive for gait problem.  Skin: Negative for color change, pallor and rash.       Pannus intertrigo managed by wound Nurse   Neurological: Negative for dizziness, seizures, syncope, light-headedness and headaches.  Hematological:       Easily bruises  Psychiatric/Behavioral: Negative for agitation, confusion, hallucinations and sleep  disturbance. The patient is not nervous/anxious.     Immunization History  Administered Date(s) Administered  . Influenza, High Dose Seasonal PF 11/25/2015  . PPD Test 06/02/2016   Pertinent  Health Maintenance Due  Topic Date Due  . FOOT EXAM  02/04/1956  . COLONOSCOPY  02/04/1996  . PNA vac Low Risk Adult (1 of 2 - PCV13) 02/04/2011  . OPHTHALMOLOGY EXAM  08/10/2016  . INFLUENZA VACCINE  09/28/2016  . HEMOGLOBIN A1C  11/29/2016  . MAMMOGRAM  05/27/2017  . DEXA SCAN  Completed   Fall Risk  05/30/2016 05/02/2016 04/13/2016 03/15/2016 02/25/2016  Falls in the past year? _0   Number falls in past yr: 2 or more 2 or more 2 or more 2 or more 2 or more  Injury with Fall? No No No Yes Yes  Risk Factor Category  High Fall Risk - - High Fall Risk High Fall Risk  Risk for fall due to : History of fall(s);Impaired balance/gait History of fall(s);Impaired balance/gait Impaired mobility History of fall(s) History of fall(s)  Follow up Falls prevention discussed Falls prevention discussed Falls prevention discussed Education provided;Falls prevention discussed -   Functional Status Survey:    Vitals:    06/10/16 0909  BP: 127/89  Pulse: 74  Resp: 20  Temp: 98 F (36.7 C)  TempSrc: Oral  SpO2: 95%  Weight: 237 lb 14.4 oz (107.9 kg)  Height: _1  (1.575 m)   Body mass index is 43.51 kg/m. Physical Exam  Constitutional: She is oriented to person, place, and time.  Obese elderly in no acute distress   HENT:  Head: Normocephalic.  Mouth/Throat: Oropharynx is clear and moist. No oropharyngeal exudate.  Eyes: Conjunctivae and EOM are normal. Pupils are equal, round, and reactive to light. Right eye exhibits no discharge. Left eye exhibits no discharge. No scleral icterus.  Neck: Normal range of motion. No JVD present. No thyromegaly present.  Cardiovascular: Regular rhythm and intact distal pulses.  Exam reveals no friction rub.   Murmur heard. Pulmonary/Chest: Effort normal and breath sounds normal. No respiratory distress. She has no wheezes. She has no rales.  Oxygen 2 liters [place] in place  Abdominal: Soft. Bowel sounds are normal. She exhibits no distension. There is no tenderness. There is no rebound and no guarding.  Musculoskeletal: She exhibits no tenderness or deformity.  Moves x 4 extremities without any difficulties.   Lymphadenopathy:    She has no cervical adenopathy.  Neurological: She is oriented to person, place, and time.  Skin: Skin is warm and dry. No rash noted. No erythema. No pallor.  Bilateral pannus previous blister site raw red with serosanguinous drainage on old dressing noted. Surrounding skin without any signs of infections.   Psychiatric: She has a normal mood and affect.    Labs reviewed:  Recent Labs  12/14/15 1402  05/31/16 0447 05/31/16 1446 06/01/16 0500 06/02/16 0451  NA  --   < > 135  --  136 134*  K 3.4*  < > 2.1* 2.8* 3.5 3.2*  CL  --   < > 87*  --  89* 89*  CO2  --   < > 41*  --  39* 38*  GLUCOSE  --   < > 252*  --  242* 164*  BUN  --   < > 10  --  11 11  CREATININE  --   < > 0.78  --  0.55 0.70  CALCIUM  --   < >  8.1*  --  8.1* 8.2*   MG 1.7  --  1.8  --  1.8  --   PHOS  --   --   --   --  3.0  --   < > = values in this interval not displayed.  Recent Labs  03/11/16 1021 04/25/16 1425 05/30/16 1905  AST 33 80* 44*  ALT 19 26 32  ALKPHOS 137* 190* 147*  BILITOT 1.3* 1.9* 2.5*  PROT 5.3* 6.1 5.9*  ALBUMIN 2.6* 2.8* 2.9*    Recent Labs  01/05/16 1105  03/11/16 1021 05/30/16 1905 05/31/16 0447 06/01/16 0500  WBC 11.4*  < > 9.7 9.5 10.8  --   NEUTROABS 7.9*  --  5.9 8.2*  --   --   HGB 11.9*  < > 12.3 9.5* 8.8* 9.1*  HCT 37.0  < > 38.3 28.6* 26.7*  --   MCV 82.2  < > 88.4 99.4 98.6  --   PLT 272.0  < > 215.0 152 142*  --   < > = values in this interval not displayed. Lab Results  Component Value Date   TSH 1.33 12/30/2015   Lab Results  Component Value Date   HGBA1C 5.5 05/30/2016   Lab Results  Component Value Date   CHOL 146 12/30/2015   HDL 49.70 12/30/2015   LDLCALC 73 12/30/2015   TRIG 116.0 12/30/2015   CHOLHDL 3 12/30/2015   Assessment/Plan 1. Hypokalemia  K+ 3.0 ( 06/09/2016).currently on Potassium 40 meq Tablet twice daily.will give Potassium 40 meq Tablet twice daily. Recheck BMP 06/13/2016.    2. Moderate protein-calorie malnutrition (HCC)  TP 5.2 ( 06/09/2016).Will consult Registered dietician for protein supplements. Continue to monitor CMP.   3. Hypoalbuminemia  Alb 2.74 ( 06/09/2016).Will consult Registered dietician for protein supplements. Continue to monitor CMP.   4. Hyponatremia Na 133 ( 06/09/2016).Possible nutrition related. Continue to monitor.   5. Iron deficiency anemia, unspecified iron deficiency anemia type Hgb 9.3, HCT 29 ( 06/09/2016).Hgb has improved. Previous 8.8, 9.1.continue on ferrous sulfate and monitor Hgb.    6. Uncontrolled type 2 diabetes mellitus with diabetic neuropathy, with long-term current use of insulin  CBG's ranging in the 110's-upper 300's.Prednisone might be contributing to hyperglycemia. Will increase Lantus to 24 units SQ at bedtime.  Continue on Novolog 6 units three times with meals and Humalog per SSI. Continue to monitor CBG's ACHS. Recheck Hgb A1C in 3 months.     Family/ staff Communication: Reviewed plan of care with patient and facility Nurse supervisor  Labs/tests ordered: BMP 06/13/2016  Sandrea Hughs, NP

## 2016-06-13 ENCOUNTER — Telehealth: Payer: Self-pay | Admitting: Family

## 2016-06-13 ENCOUNTER — Ambulatory Visit: Payer: Medicare Other | Admitting: Family

## 2016-06-13 NOTE — Telephone Encounter (Signed)
Patient did not show for her [facility] appointment on 06/13/16. Will attempt to reschedule.

## 2016-06-14 ENCOUNTER — Encounter: Payer: Medicare Other | Admitting: Physical Therapy

## 2016-06-14 LAB — BASIC METABOLIC PANEL
BUN: 17 mg/dL (ref 4–21)
Creatinine: 0.8 mg/dL (ref 0.5–1.1)
Glucose: 267 mg/dL
Potassium: 3.1 mmol/L — AB (ref 3.4–5.3)
SODIUM: 136 mmol/L — AB (ref 137–147)

## 2016-06-16 ENCOUNTER — Encounter: Payer: Medicare Other | Admitting: Physical Therapy

## 2016-06-17 ENCOUNTER — Encounter: Payer: Self-pay | Admitting: Family

## 2016-06-17 ENCOUNTER — Non-Acute Institutional Stay (SKILLED_NURSING_FACILITY): Payer: Medicare Other | Admitting: Family

## 2016-06-17 DIAGNOSIS — R0902 Hypoxemia: Secondary | ICD-10-CM | POA: Diagnosis not present

## 2016-06-17 NOTE — Progress Notes (Signed)
Location:  [place] Room Number: 5465 Place of Service:  SNF [phone]) Provider: Dinah Ngetich FNP-C  Mable Paris, FNP  Patient Care Team: Burnard Hawthorne, FNP as PCP - General (Family Medicine) Alisa Graff, FNP as Nurse Practitioner (Family Medicine) Yolonda Kida, MD as Consulting Physician (Cardiology)  Extended Emergency Contact Information Primary Emergency Contact: Eliezer Champagne of [place] Phone: [phone] Relation: Son Secondary Emergency Contact: [place] of [place] Mobile Phone: [phone] Relation: Daughter  Code Status: Full Code  Goals of care: Advanced Directive information Advanced Directives 06/17/2016  Does Patient Have a Medical Advance Directive? No  Type of Advance Directive -  Does patient want to make changes to medical advance directive? -  Copy of [place] in Chart? -  Would patient like information on creating a medical advance directive? -     Chief Complaint  Patient presents with  . Acute Visit    02sat not improving after increasing 02. 02sat is 86 at 2 liter.    HPI:  Pt is a 71 y.o. female seen today at [facility] for an acute visit for evaluation of abnormal lab results. She has a significant medical history of HTN, CHF, type 2 DM with neuropathy, GERD, morbid obesity among other conditions. She is seen in her room today. She denies any acute issues this visit. Facility Nurse reports patient oxygen saturation was 86 % on 1 Liter [place]. On call provider was notified recommended increase oxygen to 2 Liters via [place]. Facility Nurse reports this morning oxygen saturation went upto 89 % on 2 liters [place]. Patient denies any fever, chills, cough or shortness of breath. She is up on the wheelchair after AM care by facility CNA.she asymptomatic on room air.    Past Medical History:  Diagnosis Date  . Anemia   . Asthma   .  CHF (congestive heart failure) ([place])   . CVA (cerebral vascular accident) ([place])   . Diabetes mellitus without complication ([place])   . GERD (gastroesophageal reflux disease)   . Heart murmur   . History of hiatal hernia   . Hypertension   . Panic attacks   . Peripheral vascular disease ([place])   . Restless leg syndrome   . Shortness of breath dyspnea    Past Surgical History:  Procedure Laterality Date  . ABDOMINAL HYSTERECTOMY    . BREAST BIOPSY Right yrs ago   benign  . ENDARTERECTOMY Right 07/24/2014   Procedure: ENDARTERECTOMY CAROTID;  Surgeon: Algernon Huxley, MD;  Location: [facility] ORS;  Service: Vascular;  Laterality: Right;  . EYE SURGERY Left    cataract  . FRACTURE SURGERY Left    fractured ankle  . TONSILLECTOMY      Allergies  Allergen Reactions  . Codeine Other (See Comments)    Reaction:  Dizziness   . Liraglutide     Other reaction(s): nausea and stomach pain    Allergies as of 06/17/2016      Reactions   Codeine Other (See Comments)   Reaction:  Dizziness    Liraglutide    Other reaction(s): nausea and stomach pain      Medication List       Accurate as of 06/17/16  3:08 PM. Always use your most recent med list.          acetaminophen 325 MG tablet Commonly known as:  TYLENOL Take 2 tablets (650 mg total) by  mouth every 6 (six) hours as needed for mild pain (or Fever >/= 101).   albuterol (2.5 MG/3ML) 0.083% nebulizer solution Commonly known as:  PROVENTIL Take 3 mLs (2.5 mg total) by nebulization every 4 (four) hours as needed for wheezing.   aspirin EC 81 MG tablet Take 81 mg by mouth daily.   B-12 1000 MCG/ML Kit Inject 1,000 mcg as directed every 30 (thirty) days.   Cholecalciferol 1000 units capsule Take 1,000 Units by mouth daily.   dapsone 25 MG tablet Take 100 mg by mouth daily. Take 150 mg daily per dermatology note   feeding supplement (PRO-STAT SUGAR FREE 64) Liqd Take 30 mLs by mouth. 30 ml daily for 30 days   ferrous sulfate 325  (65 FE) MG tablet Take 1 tablet (325 mg total) by mouth daily with breakfast.   folic acid 1 MG tablet Commonly known as:  FOLVITE Take 1 mg by mouth daily. Take one pill daily on Tuesday, Wednesday, Thursday, Friday, Saturday and Sunday. These are days were patient isn't taking methotrexate   gabapentin 800 MG tablet Commonly known as:  NEURONTIN Take 800 mg by mouth 2 (two) times daily.   hydrochlorothiazide 12.5 MG tablet Commonly known as:  HYDRODIURIL Take 12.5 mg by mouth 2 (two) times daily.   insulin aspart 100 UNIT/ML injection Commonly known as:  novoLOG Inject 10 Units into the skin 3 (three) times daily before meals. 301-350+ 10 units, 351 or higher NOTIFY MD   insulin aspart 100 UNIT/ML injection Commonly known as:  novoLOG Inject 6 Units into the skin 3 (three) times daily with meals.   insulin glargine 100 UNIT/ML injection Commonly known as:  LANTUS Inject 0.2 mLs (20 Units total) into the skin at bedtime.   lovastatin 20 MG tablet Commonly known as:  MEVACOR Take 20 mg by mouth at bedtime.   methotrexate 2.5 MG tablet Commonly known as:  RHEUMATREX Take 10 mg by mouth once a week. Caution:Chemotherapy. Protect from light.  Take 4 tablets by mouth weekly   MULTIVITAL tablet Take 1 tablet by mouth daily.   omeprazole 20 MG capsule Commonly known as:  PRILOSEC Take 1 capsule (20 mg total) by mouth at bedtime.   potassium chloride SA 20 MEQ tablet Commonly known as:  K-DUR,KLOR-CON Take 2 tablets (40 mEq total) by mouth 2 (two) times daily.   ramipril 2.5 MG capsule Commonly known as:  ALTACE Take 1 capsule (2.5 mg total) by mouth daily.   SILVASORB Gel Apply 1 application topically 2 (two) times daily as needed.   torsemide 20 MG tablet Commonly known as:  DEMADEX Take 2 tablets (40 mg total) by mouth 2 (two) times daily.       Review of Systems  Constitutional: Negative for activity change, appetite change, chills, fatigue and fever.  HENT:  Negative for congestion, rhinorrhea, sinus pain, sinus pressure, sneezing and sore throat.   Eyes: Negative.   Respiratory: Negative for cough, chest tightness, shortness of breath and wheezing.   Cardiovascular: Negative for chest pain, palpitations and leg swelling.  Gastrointestinal: Negative for abdominal distention, abdominal pain, constipation, diarrhea, nausea and vomiting.  Genitourinary: Negative for dysuria, flank pain, frequency and urgency.  Musculoskeletal: Positive for gait problem.  Skin: Negative for color change, pallor and rash.  Neurological: Negative for dizziness, seizures, syncope, light-headedness and headaches.  Psychiatric/Behavioral: Negative for agitation, confusion, hallucinations and sleep disturbance. The patient is not nervous/anxious.     Immunization History  Administered Date(s) Administered  .  Influenza, High Dose Seasonal PF 11/25/2015  . PPD Test 06/02/2016   Pertinent  Health Maintenance Due  Topic Date Due  . FOOT EXAM  02/04/1956  . COLONOSCOPY  02/04/1996  . PNA vac Low Risk Adult (1 of 2 - PCV13) 02/04/2011  . OPHTHALMOLOGY EXAM  08/10/2016  . INFLUENZA VACCINE  09/28/2016  . HEMOGLOBIN A1C  11/29/2016  . MAMMOGRAM  05/27/2017  . DEXA SCAN  Completed   Fall Risk  05/30/2016 05/02/2016 04/13/2016 03/15/2016 02/25/2016  Falls in the past year? Yes Yes Yes Yes Yes  Number falls in past yr: 2 or more 2 or more 2 or more 2 or more 2 or more  Injury with Fall? No No No Yes Yes  Risk Factor Category  High Fall Risk - - High Fall Risk High Fall Risk  Risk for fall due to : History of fall(s);Impaired balance/gait History of fall(s);Impaired balance/gait Impaired mobility History of fall(s) History of fall(s)  Follow up Falls prevention discussed Falls prevention discussed Falls prevention discussed Education provided;Falls prevention discussed -    Vitals:   06/17/16 1104  BP: 128/63  Pulse: 72  Resp: 20  Temp: (!) 96.8 F (36 C)  SpO2: (!) 89%    Weight: 223 lb 12.8 oz (101.5 kg)  Height: 5' 2"  (1.575 m)   Body mass index is 40.93 kg/m. Physical Exam  Constitutional: She is oriented to person, place, and time.  Obese elderly in no acute distress   HENT:  Head: Normocephalic.  Mouth/Throat: Oropharynx is clear and moist. No oropharyngeal exudate.  Eyes: Conjunctivae and EOM are normal. Pupils are equal, round, and reactive to light. Right eye exhibits no discharge. Left eye exhibits no discharge. No scleral icterus.  Neck: Normal range of motion. No JVD present. No thyromegaly present.  Cardiovascular: Regular rhythm and intact distal pulses.  Exam reveals no gallop and no friction rub.   Murmur heard. Pulmonary/Chest: Effort normal and breath sounds normal. No respiratory distress. She has no wheezes. She has no rales.  Oxygen 2 liters [place] off during visit   Abdominal: Soft. Bowel sounds are normal. She exhibits no distension. There is no tenderness. There is no rebound and no guarding.  Musculoskeletal: She exhibits no tenderness or deformity.  Moves x 4 extremities without any difficulties. Unsteady gait   Lymphadenopathy:    She has no cervical adenopathy.  Neurological: She is oriented to person, place, and time.  Skin: Skin is warm and dry. No rash noted. No erythema. No pallor.     Psychiatric: She has a normal mood and affect.    Labs reviewed:  Recent Labs  12/14/15 1402  05/31/16 0447  06/01/16 0500 06/02/16 0451 06/09/16  NA  --   < > 135  --  136 134* 133*  K 3.4*  < > 2.1*  < > 3.5 3.2* 3.0*  CL  --   < > 87*  --  89* 89*  --   CO2  --   < > 41*  --  39* 38*  --   GLUCOSE  --   < > 252*  --  242* 164*  --   BUN  --   < > 10  --  11 11 15   CREATININE  --   < > 0.78  --  0.55 0.70 0.8  CALCIUM  --   < > 8.1*  --  8.1* 8.2*  --   MG 1.7  --  1.8  --  1.8  --   --   PHOS  --   --   --   --  3.0  --   --   < > = values in this interval not displayed.  Recent Labs  03/11/16 1021 04/25/16 1425  05/30/16 1905 06/09/16  AST 33 80* 44* 23  ALT 19 26 32 20  ALKPHOS 137* 190* 147* 118  BILITOT 1.3* 1.9* 2.5*  --   PROT 5.3* 6.1 5.9*  --   ALBUMIN 2.6* 2.8* 2.9*  --     Recent Labs  01/05/16 1105  03/11/16 1021 05/30/16 1905 05/31/16 0447 06/01/16 0500 06/09/16  WBC 11.4*  < > 9.7 9.5 10.8  --  7.8  NEUTROABS 7.9*  --  5.9 8.2*  --   --   --   HGB 11.9*  < > 12.3 9.5* 8.8* 9.1* 9.3*  HCT 37.0  < > 38.3 28.6* 26.7*  --  29*  MCV 82.2  < > 88.4 99.4 98.6  --   --   PLT 272.0  < > 215.0 152 142*  --  206  < > = values in this interval not displayed. Lab Results  Component Value Date   TSH 1.33 12/30/2015   Lab Results  Component Value Date   HGBA1C 5.5 05/30/2016   Lab Results  Component Value Date   CHOL 146 12/30/2015   HDL 49.70 12/30/2015   LDLCALC 73 12/30/2015   TRIG 116.0 12/30/2015   CHOLHDL 3 12/30/2015   Assessment/Plan  Hypoxia  Overnight oxygen saturations was 86 % on 1 Liter [place]. Oxygen increased to 2 Liters [place] with slight improvement. Currently on Room air without any shortness of breath or wheezing. Bilateral Lung fields diminished clear to bases.Will obtain portable CXR Pa/Lat rule out Pneumonia. Will continue on Albuterol via Nebulization. Suspect possible sleep apnea will need sleep study on outpatient.   Family/ staff Communication: Reviewed plan of care with patient and facility Nurse supervisor  Labs/tests ordered: portable CXR Pa/Lat rule out Pneumonia.   Sandrea Hughs, NP

## 2016-06-20 ENCOUNTER — Encounter: Payer: Self-pay | Admitting: Internal Medicine

## 2016-06-20 ENCOUNTER — Non-Acute Institutional Stay (SKILLED_NURSING_FACILITY): Payer: Medicare Other | Admitting: Internal Medicine

## 2016-06-20 DIAGNOSIS — I5032 Chronic diastolic (congestive) heart failure: Secondary | ICD-10-CM | POA: Diagnosis not present

## 2016-06-20 DIAGNOSIS — R0902 Hypoxemia: Secondary | ICD-10-CM | POA: Diagnosis not present

## 2016-06-20 LAB — BASIC METABOLIC PANEL
BUN: 19 mg/dL (ref 4–21)
CREATININE: 0.9 mg/dL (ref 0.5–1.1)
Glucose: 255 mg/dL
POTASSIUM: 3.8 mmol/L (ref 3.4–5.3)
Sodium: 133 mmol/L — AB (ref 137–147)

## 2016-06-20 NOTE — Progress Notes (Signed)
LOCATION: [facility]  PCP: Mable Paris, FNP   Code Status: Full Code  Goals of care: Advanced Directive information Advanced Directives 06/17/2016  Does Patient Have a Medical Advance Directive? No  Type of Advance Directive -  Does patient want to make changes to medical advance directive? -  Copy of [place] in Chart? -  Would patient like information on creating a medical advance directive? -       Extended Emergency Contact Information Primary Emergency Contact: [place], [place] Phone: [phone] Relation: Son Secondary Emergency Contact: Courtney Heys [place] Mobile Phone: [phone] Relation: Daughter   Allergies  Allergen Reactions  . Codeine Other (See Comments)    Reaction:  Dizziness   . Liraglutide     Other reaction(s): nausea and stomach pain    Chief Complaint  Patient presents with  . Acute Visit    Abnormal chest xray and hypoxia     HPI:  Patient is a 71 y.o. female seen today for acute visit. Staff noticed her to be hypoxic by pulse oximeter yesterday with o2 sat in mid 80s. Chest xray was ordered suggestive of pulmonary edema. She is seen in her room today. She denies any new or worsening dyspnea. She is sitting in her room. Her breathing is stable. her leg edema has decreased. She has PMH of chronic diastolic chf exacerbation.  Review of Systems:  Constitutional: Negative for fever, chills HENT: Negative for headache, congestion Eyes: Negative for  blurred vision, double vision and discharge. Wears glasses. Respiratory: Negative for cough, shortness of breath and wheezing.  currently on o2 by nasal canula.  Cardiovascular: Negative for chest pain, palpitations.  Gastrointestinal: Negative for heartburn, nausea, vomiting, abdominal pain, loss of appetite. Genitourinary: Negative for dysuria.  Musculoskeletal: Negative for weakness. Skin: Negative for itching,  rash.  Neurological: Negative for dizziness. Psychiatric/Behavioral: Negative for depression.   Past Medical History:  Diagnosis Date  . Anemia   . Asthma   . CHF (congestive heart failure) ([place])   . CVA (cerebral vascular accident) ([place])   . Diabetes mellitus without complication ([place])   . GERD (gastroesophageal reflux disease)   . Heart murmur   . History of hiatal hernia   . Hypertension   . Panic attacks   . Peripheral vascular disease ([place])   . Restless leg syndrome   . Shortness of breath dyspnea    Past Surgical History:  Procedure Laterality Date  . ABDOMINAL HYSTERECTOMY    . BREAST BIOPSY Right yrs ago   benign  . ENDARTERECTOMY Right 07/24/2014   Procedure: ENDARTERECTOMY CAROTID;  Surgeon: Algernon Huxley, MD;  Location: [facility] ORS;  Service: Vascular;  Laterality: Right;  . EYE SURGERY Left    cataract  . FRACTURE SURGERY Left    fractured ankle  . TONSILLECTOMY     Social History:   reports that she has never smoked. She has never used smokeless tobacco. She reports that she does not drink alcohol or use drugs.  Family History  Problem Relation Age of Onset  . Pancreatic cancer Mother   . CAD Father   . Hypertension Father   . Pancreatic cancer Father   . Colon cancer Father   . CAD Brother   . Heart disease Brother     Heart attack  . Breast cancer Maternal Aunt     pt states several maternal aunts    Medications: Allergies as of 06/20/2016  Reactions   Codeine Other (See Comments)   Reaction:  Dizziness    Liraglutide    Other reaction(s): nausea and stomach pain      Medication List       Accurate as of 06/20/16 12:56 PM. Always use your most recent med list.          acetaminophen 325 MG tablet Commonly known as:  TYLENOL Take 2 tablets (650 mg total) by mouth every 6 (six) hours as needed for mild pain (or Fever >/= 101).   albuterol (2.5 MG/3ML) 0.083% nebulizer solution Commonly known as:  PROVENTIL Take 3 mLs (2.5 mg total) by  nebulization every 4 (four) hours as needed for wheezing.   aspirin EC 81 MG tablet Take 81 mg by mouth daily.   B-12 1000 MCG/ML Kit Inject 1,000 mcg as directed every 30 (thirty) days.   Cholecalciferol 1000 units capsule Take 1,000 Units by mouth daily.   dapsone 25 MG tablet Take 100 mg by mouth daily. Take 150 mg daily per dermatology note   feeding supplement (PRO-STAT SUGAR FREE 64) Liqd Take 30 mLs by mouth. 30 ml daily for 30 days   ferrous sulfate 325 (65 FE) MG tablet Take 1 tablet (325 mg total) by mouth daily with breakfast.   folic acid 1 MG tablet Commonly known as:  FOLVITE Take 1 mg by mouth daily. Take one pill daily on Tuesday, Wednesday, Thursday, Friday, Saturday and Sunday. These are days were patient isn't taking methotrexate   gabapentin 800 MG tablet Commonly known as:  NEURONTIN Take 800 mg by mouth 2 (two) times daily.   hydrochlorothiazide 12.5 MG tablet Commonly known as:  HYDRODIURIL Take 12.5 mg by mouth 2 (two) times daily.   insulin aspart 100 UNIT/ML injection Commonly known as:  novoLOG Inject 10 Units into the skin 3 (three) times daily before meals. 301-350+ 10 units, 351 or higher NOTIFY MD   insulin aspart 100 UNIT/ML injection Commonly known as:  novoLOG Inject 6 Units into the skin 3 (three) times daily with meals.   insulin glargine 100 UNIT/ML injection Commonly known as:  LANTUS Inject 0.2 mLs (20 Units total) into the skin at bedtime.   lovastatin 20 MG tablet Commonly known as:  MEVACOR Take 20 mg by mouth at bedtime.   methotrexate 2.5 MG tablet Commonly known as:  RHEUMATREX Take 10 mg by mouth once a week. Caution:Chemotherapy. Protect from light.  Take 4 tablets by mouth weekly   MULTIVITAL tablet Take 1 tablet by mouth daily.   omeprazole 20 MG capsule Commonly known as:  PRILOSEC Take 1 capsule (20 mg total) by mouth at bedtime.   potassium chloride SA 20 MEQ tablet Commonly known as:  K-DUR,KLOR-CON Take  2 tablets (40 mEq total) by mouth 2 (two) times daily.   predniSONE 5 MG tablet Commonly known as:  DELTASONE Take 15 mg by mouth daily with breakfast. Stop date 06/21/16   predniSONE 5 MG tablet Commonly known as:  DELTASONE Take 15 mg by mouth daily with breakfast. Start on 06/28/16. Stop date 07/04/16   predniSONE 10 MG tablet Commonly known as:  DELTASONE Take 10 mg by mouth daily with breakfast. Until seen by dermatologist   ramipril 2.5 MG capsule Commonly known as:  ALTACE Take 1 capsule (2.5 mg total) by mouth daily.   SILVASORB Gel Apply 1 application topically 2 (two) times daily as needed.   torsemide 20 MG tablet Commonly known as:  DEMADEX Take 2 tablets (40  mg total) by mouth 2 (two) times daily.       Immunizations: Immunization History  Administered Date(s) Administered  . Influenza, High Dose Seasonal PF 11/25/2015  . PPD Test 06/02/2016, 06/16/2016     Physical Exam: Vitals:   06/20/16 1243  BP: 114/62  Pulse: 78  Resp: 16  Temp: 98.6 F (37 C)  TempSrc: Oral  SpO2: 94%  Weight: 217 lb 4.8 oz (98.6 kg)  Height: _0  (1.575 m)   Body mass index is 39.74 kg/m.  General- elderly female, morbidly obese, in no acute distress Head- normocephalic, atraumatic Nose- no nasal discharge Throat- moist mucus membrane, has dentures Eyes- PERRLA, EOMI, no pallor, no icterus, no discharge Neck- no cervical lymphadenopathy Cardiovascular- normal S2,L9, + systolic murmur Respiratory- bilateral clear to auscultation, no wheeze, no rhonchi, no crackles, no use of accessory muscles, on o2 by nasal canula 2l/min Abdomen- bowel sounds present, soft, non tender Musculoskeletal- able to move all 4 extremities, generalized weakness, no leg edema Neurological- alert and oriented to person, place and time Skin- warm and dry Psychiatry- normal mood and affect    Labs reviewed: Basic Metabolic Panel:  Recent Labs  12/14/15 1402  05/31/16 0447  06/01/16 0500  06/02/16 0451 06/09/16 06/14/16  NA  --   < > 135  --  136 134* 133* 136*  K 3.4*  < > 2.1*  < > 3.5 3.2* 3.0* 3.1*  CL  --   < > 87*  --  89* 89*  --   --   CO2  --   < > 41*  --  39* 38*  --   --   GLUCOSE  --   < > 252*  --  242* 164*  --   --   BUN  --   < > 10  --  _1 CREATININE  --   < > 0.78  --  0.55 0.70 0.8 0.8  CALCIUM  --   < > 8.1*  --  8.1* 8.2*  --   --   MG 1.7  --  1.8  --  1.8  --   --   --   PHOS  --   --   --   --  3.0  --   --   --   < > = values in this interval not displayed. Liver Function Tests:  Recent Labs  03/11/16 1021 04/25/16 1425 05/30/16 1905 06/09/16  AST 33 80* 44* 23  ALT 19 26 32 20  ALKPHOS 137* 190* 147* 118  BILITOT 1.3* 1.9* 2.5*  --   PROT 5.3* 6.1 5.9*  --   ALBUMIN 2.6* 2.8* 2.9*  --     Recent Labs  03/03/16 1318 03/11/16 1021  LIPASE 33 54.0   No results for input(s): AMMONIA in the last 8760 hours. CBC:  Recent Labs  01/05/16 1105  03/11/16 1021 05/30/16 1905 05/31/16 0447 06/01/16 0500 06/09/16  WBC 11.4*  < > 9.7 9.5 10.8  --  7.8  NEUTROABS 7.9*  --  5.9 8.2*  --   --   --   HGB 11.9*  < > 12.3 9.5* 8.8* 9.1* 9.3*  HCT 37.0  < > 38.3 28.6* 26.7*  --  29*  MCV 82.2  < > 88.4 99.4 98.6  --   --   PLT 272.0  < > 215.0 152 142*  --  206  < > = values in this interval  not displayed. Cardiac Enzymes:  Recent Labs  11/15/15 1522 12/10/15 1348 05/30/16 1905  TROPONINI 0.03* 0.05* 0.03*   BNP: Invalid input(s): POCBNP CBG:  Recent Labs  06/02/16 0741 06/02/16 1151 06/02/16 1700  GLUCAP 158* 164* 286*    Radiological Exams: Dg Chest Portable 1 View  Result Date: 05/30/2016 CLINICAL DATA:  Weakness starting at 8 a.m. EXAM: PORTABLE CHEST 1 VIEW COMPARISON:  12/10/2015 FINDINGS: Heart is top-normal in size. There are calcifications of the aortic arch consistent with atherosclerosis. Mitral annular calcifications are also present. Bilateral chronic interstitial prominence is noted which may  reflect chronic bronchitic change. No disease tissue edema might also contribute to this appearance. Minimal atelectasis at the left lung base. No pneumothorax nor significant effusion. 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 IMPRESSION: Pulmonary interstitial prominence which may reflect changes of interstitial edema or possibly chronic bronchitic change. Stable borderline cardiomegaly with aortic atherosclerosis. Electronically Signed   By: Ashley Royalty M.D.   On: 05/30/2016 19:24   Chest X-ray AP/Lat 06/17/16 Impression. Cardiac enlargement. Pulmonary vascular prominence, cephalization flow, findings consistent with CHF. Correlate with auscultation. Cardiac evaluation recommended.  Assessment/Plan  chronic diastolic chf EF 83-25%. o2 sat in 90s. No clinical exacerbation of chf. Monitor clinically for now. Continue o2 by nasal canula. Weight 217 lb today per patient. Weight has been stable. Continue torsemide 40 mg bid, ramipril 2.5 mg daily. Monitor weight and clinical symptom.   Hypoxia Likely erroneous entry given her normal lung exam, denies any worsening dyspnea. o2 sat in 90s at present. Monitor clinically   Blanchie Serve, MD Internal Medicine [facility] [address] [place], [place] [address] Cell Phone (Monday-Friday 8 am - 5 pm): [phone] On Call: [phone] and follow prompts after 5 pm and on weekends Office Phone: [phone] Office Fax: [phone]

## 2016-06-21 ENCOUNTER — Non-Acute Institutional Stay (SKILLED_NURSING_FACILITY): Payer: Medicare Other | Admitting: Family

## 2016-06-21 ENCOUNTER — Encounter: Payer: Self-pay | Admitting: Family

## 2016-06-21 ENCOUNTER — Encounter: Payer: Medicare Other | Admitting: Physical Therapy

## 2016-06-21 DIAGNOSIS — I5022 Chronic systolic (congestive) heart failure: Secondary | ICD-10-CM

## 2016-06-21 DIAGNOSIS — E1165 Type 2 diabetes mellitus with hyperglycemia: Secondary | ICD-10-CM | POA: Diagnosis not present

## 2016-06-21 DIAGNOSIS — R2681 Unsteadiness on feet: Secondary | ICD-10-CM | POA: Diagnosis not present

## 2016-06-21 DIAGNOSIS — Z794 Long term (current) use of insulin: Secondary | ICD-10-CM

## 2016-06-21 DIAGNOSIS — E1140 Type 2 diabetes mellitus with diabetic neuropathy, unspecified: Secondary | ICD-10-CM | POA: Diagnosis not present

## 2016-06-21 DIAGNOSIS — I110 Hypertensive heart disease with heart failure: Secondary | ICD-10-CM | POA: Diagnosis not present

## 2016-06-21 DIAGNOSIS — E782 Mixed hyperlipidemia: Secondary | ICD-10-CM

## 2016-06-21 DIAGNOSIS — K219 Gastro-esophageal reflux disease without esophagitis: Secondary | ICD-10-CM

## 2016-06-21 DIAGNOSIS — IMO0002 Reserved for concepts with insufficient information to code with codable children: Secondary | ICD-10-CM

## 2016-06-21 LAB — HEPATIC FUNCTION PANEL
ALK PHOS: 152 U/L — AB (ref 25–125)
ALT: 29 U/L (ref 7–35)
AST: 32 U/L (ref 13–35)
Bilirubin, Total: 1.5 mg/dL

## 2016-06-21 LAB — CBC AND DIFFERENTIAL
HEMATOCRIT: 32 % — AB (ref 36–46)
Hemoglobin: 10.2 g/dL — AB (ref 12.0–16.0)
Platelets: 257 10*3/uL (ref 150–399)
WBC: 16.3 10^3/mL

## 2016-06-21 LAB — BASIC METABOLIC PANEL
BUN: 23 mg/dL — AB (ref 4–21)
CREATININE: 0.9 mg/dL (ref 0.5–1.1)
GLUCOSE: 198 mg/dL
POTASSIUM: 3.4 mmol/L (ref 3.4–5.3)
SODIUM: 135 mmol/L — AB (ref 137–147)

## 2016-06-22 ENCOUNTER — Encounter: Payer: Self-pay | Admitting: Emergency Medicine

## 2016-06-22 ENCOUNTER — Emergency Department: Payer: Medicare Other

## 2016-06-22 ENCOUNTER — Inpatient Hospital Stay
Admission: EM | Admit: 2016-06-22 | Discharge: 2016-06-25 | DRG: 193 | Disposition: A | Discharge status: Home Health | Payer: Medicare Other | Attending: Internal Medicine | Admitting: Internal Medicine

## 2016-06-22 DIAGNOSIS — I959 Hypotension, unspecified: Secondary | ICD-10-CM | POA: Diagnosis present

## 2016-06-22 DIAGNOSIS — E860 Dehydration: Secondary | ICD-10-CM | POA: Diagnosis present

## 2016-06-22 DIAGNOSIS — I5032 Chronic diastolic (congestive) heart failure: Secondary | ICD-10-CM | POA: Diagnosis present

## 2016-06-22 DIAGNOSIS — E876 Hypokalemia: Secondary | ICD-10-CM | POA: Diagnosis present

## 2016-06-22 DIAGNOSIS — J189 Pneumonia, unspecified organism: Secondary | ICD-10-CM | POA: Diagnosis present

## 2016-06-22 DIAGNOSIS — J9622 Acute and chronic respiratory failure with hypercapnia: Secondary | ICD-10-CM | POA: Diagnosis not present

## 2016-06-22 DIAGNOSIS — J90 Pleural effusion, not elsewhere classified: Secondary | ICD-10-CM | POA: Diagnosis not present

## 2016-06-22 DIAGNOSIS — E1151 Type 2 diabetes mellitus with diabetic peripheral angiopathy without gangrene: Secondary | ICD-10-CM | POA: Diagnosis present

## 2016-06-22 DIAGNOSIS — Z7952 Long term (current) use of systemic steroids: Secondary | ICD-10-CM

## 2016-06-22 DIAGNOSIS — E669 Obesity, unspecified: Secondary | ICD-10-CM | POA: Diagnosis present

## 2016-06-22 DIAGNOSIS — Y95 Nosocomial condition: Secondary | ICD-10-CM | POA: Diagnosis present

## 2016-06-22 DIAGNOSIS — E872 Acidosis: Secondary | ICD-10-CM | POA: Diagnosis present

## 2016-06-22 DIAGNOSIS — K8020 Calculus of gallbladder without cholecystitis without obstruction: Secondary | ICD-10-CM | POA: Diagnosis present

## 2016-06-22 DIAGNOSIS — D649 Anemia, unspecified: Secondary | ICD-10-CM | POA: Diagnosis present

## 2016-06-22 DIAGNOSIS — E785 Hyperlipidemia, unspecified: Secondary | ICD-10-CM | POA: Diagnosis present

## 2016-06-22 DIAGNOSIS — N390 Urinary tract infection, site not specified: Secondary | ICD-10-CM | POA: Diagnosis present

## 2016-06-22 DIAGNOSIS — I110 Hypertensive heart disease with heart failure: Secondary | ICD-10-CM | POA: Diagnosis present

## 2016-06-22 DIAGNOSIS — Z885 Allergy status to narcotic agent status: Secondary | ICD-10-CM

## 2016-06-22 DIAGNOSIS — J9602 Acute respiratory failure with hypercapnia: Secondary | ICD-10-CM | POA: Diagnosis present

## 2016-06-22 DIAGNOSIS — K219 Gastro-esophageal reflux disease without esophagitis: Secondary | ICD-10-CM | POA: Diagnosis present

## 2016-06-22 DIAGNOSIS — N179 Acute kidney failure, unspecified: Secondary | ICD-10-CM | POA: Diagnosis not present

## 2016-06-22 DIAGNOSIS — Z888 Allergy status to other drugs, medicaments and biological substances status: Secondary | ICD-10-CM

## 2016-06-22 DIAGNOSIS — Z6841 Body Mass Index (BMI) 40.0 and over, adult: Secondary | ICD-10-CM

## 2016-06-22 DIAGNOSIS — E871 Hypo-osmolality and hyponatremia: Secondary | ICD-10-CM | POA: Diagnosis present

## 2016-06-22 DIAGNOSIS — A419 Sepsis, unspecified organism: Secondary | ICD-10-CM | POA: Diagnosis not present

## 2016-06-22 DIAGNOSIS — J181 Lobar pneumonia, unspecified organism: Secondary | ICD-10-CM | POA: Diagnosis not present

## 2016-06-22 DIAGNOSIS — E65 Localized adiposity: Secondary | ICD-10-CM | POA: Diagnosis present

## 2016-06-22 DIAGNOSIS — J9621 Acute and chronic respiratory failure with hypoxia: Secondary | ICD-10-CM | POA: Diagnosis not present

## 2016-06-22 DIAGNOSIS — R109 Unspecified abdominal pain: Secondary | ICD-10-CM

## 2016-06-22 DIAGNOSIS — L139 Bullous disorder, unspecified: Secondary | ICD-10-CM | POA: Diagnosis present

## 2016-06-22 DIAGNOSIS — Z79899 Other long term (current) drug therapy: Secondary | ICD-10-CM

## 2016-06-22 DIAGNOSIS — J9601 Acute respiratory failure with hypoxia: Secondary | ICD-10-CM | POA: Diagnosis not present

## 2016-06-22 DIAGNOSIS — Z794 Long term (current) use of insulin: Secondary | ICD-10-CM

## 2016-06-22 DIAGNOSIS — I248 Other forms of acute ischemic heart disease: Secondary | ICD-10-CM | POA: Diagnosis present

## 2016-06-22 DIAGNOSIS — G2581 Restless legs syndrome: Secondary | ICD-10-CM | POA: Diagnosis present

## 2016-06-22 DIAGNOSIS — Z8673 Personal history of transient ischemic attack (TIA), and cerebral infarction without residual deficits: Secondary | ICD-10-CM

## 2016-06-22 LAB — COMPREHENSIVE METABOLIC PANEL
ALBUMIN: 3.1 g/dL — AB (ref 3.5–5.0)
ALT: 40 U/L (ref 14–54)
AST: 60 U/L — AB (ref 15–41)
Alkaline Phosphatase: 152 U/L — ABNORMAL HIGH (ref 38–126)
Anion gap: 12 (ref 5–15)
BUN: 33 mg/dL — AB (ref 6–20)
CHLORIDE: 78 mmol/L — AB (ref 101–111)
CO2: 34 mmol/L — AB (ref 22–32)
Calcium: 9.1 mg/dL (ref 8.9–10.3)
Creatinine, Ser: 1.66 mg/dL — ABNORMAL HIGH (ref 0.44–1.00)
GFR calc Af Amer: 35 mL/min — ABNORMAL LOW (ref >60)
GFR, EST NON AFRICAN AMERICAN: 30 mL/min — AB (ref >60)
GLUCOSE: 389 mg/dL — AB (ref 65–99)
POTASSIUM: 3 mmol/L — AB (ref 3.5–5.1)
SODIUM: 124 mmol/L — AB (ref 135–145)
Total Bilirubin: 2.1 mg/dL — ABNORMAL HIGH (ref 0.3–1.2)
Total Protein: 6.3 g/dL — ABNORMAL LOW (ref 6.5–8.1)

## 2016-06-22 LAB — CBC
HCT: 30.8 % — ABNORMAL LOW (ref 35.0–47.0)
Hemoglobin: 10.3 g/dL — ABNORMAL LOW (ref 12.0–16.0)
MCH: 30.8 pg (ref 26.0–34.0)
MCHC: 33.3 g/dL (ref 32.0–36.0)
MCV: 92.6 fL (ref 80.0–100.0)
PLATELETS: 249 10*3/uL (ref 150–440)
RBC: 3.33 MIL/uL — ABNORMAL LOW (ref 3.80–5.20)
RDW: 16 % — ABNORMAL HIGH (ref 11.5–14.5)
WBC: 12.9 10*3/uL — ABNORMAL HIGH (ref 3.6–11.0)

## 2016-06-22 LAB — URINALYSIS, COMPLETE (UACMP) WITH MICROSCOPIC
Bilirubin Urine: NEGATIVE
GLUCOSE, UA: 50 mg/dL — AB
Hgb urine dipstick: NEGATIVE
KETONES UR: NEGATIVE mg/dL
NITRITE: NEGATIVE
PH: 6 (ref 5.0–8.0)
Protein, ur: 30 mg/dL — AB
Specific Gravity, Urine: 1.013 (ref 1.005–1.030)
Squamous Epithelial / HPF: NONE SEEN

## 2016-06-22 LAB — TROPONIN I: Troponin I: 0.03 ng/mL (ref <0.03)

## 2016-06-22 LAB — BRAIN NATRIURETIC PEPTIDE: B Natriuretic Peptide: 34 pg/mL (ref 0.0–100.0)

## 2016-06-22 LAB — LACTIC ACID, PLASMA: Lactic Acid, Venous: 3.6 mmol/L (ref 0.5–1.9)

## 2016-06-22 LAB — AMMONIA: AMMONIA: 19 umol/L (ref 9–35)

## 2016-06-22 MED ORDER — POTASSIUM CHLORIDE CRYS ER 20 MEQ PO TBCR
40.0000 meq | EXTENDED_RELEASE_TABLET | Freq: Once | ORAL | Status: AC
  Administered 2016-06-22: 40 meq via ORAL

## 2016-06-22 MED ORDER — VANCOMYCIN HCL 10 G IV SOLR
2000.0000 mg | Freq: Once | INTRAVENOUS | Status: AC
  Administered 2016-06-22: 2000 mg via INTRAVENOUS
  Filled 2016-06-22: qty 2000

## 2016-06-22 MED ORDER — ONDANSETRON HCL 4 MG/2ML IJ SOLN
4.0000 mg | Freq: Once | INTRAMUSCULAR | Status: AC
  Administered 2016-06-22: 4 mg via INTRAVENOUS

## 2016-06-22 MED ORDER — ONDANSETRON HCL 4 MG/2ML IJ SOLN
INTRAMUSCULAR | Status: AC
  Administered 2016-06-22: 4 mg via INTRAVENOUS
  Filled 2016-06-22: qty 2

## 2016-06-22 MED ORDER — PIPERACILLIN-TAZOBACTAM 3.375 G IVPB 30 MIN
3.3750 g | Freq: Once | INTRAVENOUS | Status: AC
Start: 2016-06-22 — End: 2016-06-23
  Administered 2016-06-22: 3.375 g via INTRAVENOUS
  Filled 2016-06-22: qty 50

## 2016-06-22 MED ORDER — POTASSIUM CHLORIDE 20 MEQ PO PACK
PACK | ORAL | Status: AC
  Filled 2016-06-22: qty 2

## 2016-06-22 MED ORDER — SODIUM CHLORIDE 0.9 % IV BOLUS (SEPSIS)
1000.0000 mL | Freq: Once | INTRAVENOUS | Status: AC
  Administered 2016-06-22: 1000 mL via INTRAVENOUS

## 2016-06-22 MED ORDER — ASPIRIN 300 MG RE SUPP: 300.0000 mg | Freq: Once | RECTAL | Status: DC

## 2016-06-22 MED ORDER — ASPIRIN 81 MG PO CHEW
324.0000 mg | CHEWABLE_TABLET | Freq: Once | ORAL | Status: AC
  Administered 2016-06-22: 324 mg via ORAL
  Filled 2016-06-22: qty 4

## 2016-06-22 MED ORDER — SODIUM CHLORIDE 0.9 % IV BOLUS (SEPSIS): 500.0000 mL | Freq: Once | INTRAVENOUS | Status: DC

## 2016-06-22 NOTE — ED Notes (Signed)
Pt placed on 3L [place], placed in trendelenburg position.  BP responding.

## 2016-06-22 NOTE — ED Triage Notes (Signed)
Pt to ED via EMS from home c/o generalized weakness today.  Patient recently in rehab for leg weakness, left today.  Pt placed on oxygen at rehab but not discharged with oxygen, presents with sat of 86% RA.  Pt A&O, chest rise even and unlabored.

## 2016-06-22 NOTE — ED Notes (Signed)
Date and time results received: 06/22/16 11:09 PM (use smartphrase ".now" to insert current time)  Test: troponin Critical Value: 0.03  Name of Provider Notified: Dr. Sharma Covert  Orders Received? Or Actions Taken?: No new orders

## 2016-06-22 NOTE — ED Notes (Signed)
Date and time results received: 06/22/16 11:10 PM (use smartphrase ".now" to insert current time)  Test: lactic acid Critical Value: 3.6  Name of Provider Notified: Dr. Sharma Covert  Orders Received? Or Actions Taken?: No new orders

## 2016-06-22 NOTE — ED Provider Notes (Signed)
[facility] Emergency Department Provider Note  ____________________________________________  Time seen: Approximately 10:24 PM  I have reviewed the triage vital signs and the nursing notes.   HISTORY  Chief Complaint Weakness    HPI [name] is a 71 y.o. female with a history of CHF, HTN, DM, CVA, presenting for generalized weakness. The patient wasdischarged from a hospitalization for UTI with acute on chronic CHF exacerbation associated with hypoxia 06/02/16. Since then, she has been in rehabilitation, and today was discharged home. Within several hours, she was sitting on the commode and was unable to stand back up. On arrival, EMS noted her to have some mild altered mental status with associated hypoxia, both of which resolved with 2 L nasal cannula. The patient denies any nausea vomiting or diarrhea, chest pain, abdominal pain, cough or cold symptoms. At this time, the patient states she is feeling better with oxygen on.   Past Medical History:  Diagnosis Date  . Anemia   . Asthma   . CHF (congestive heart failure) ([facility])   . CVA (cerebral vascular accident) ([facility])   . Diabetes mellitus without complication ([facility])   . GERD (gastroesophageal reflux disease)   . Heart murmur   . History of hiatal hernia   . Hypertension   . Panic attacks   . Peripheral vascular disease ([facility])   . Restless leg syndrome   . Shortness of breath dyspnea     Patient Active Problem List   Diagnosis Date Noted  . Pain of upper abdomen 03/10/2016  . Morbid obesity ([facility]) 02/02/2016  . HTN (hypertension) 01/06/2016  . Routine physical examination 12/30/2015  . Recurrent falls 12/13/2015  . Fatty liver 12/10/2015  . Fracture of multiple ribs 11/25/2015  . B12 deficiency anemia 11/03/2015  . GERD (gastroesophageal reflux disease) 10/17/2015  . Pure hypercholesterolemia 10/17/2015  . Uncontrolled type 2 diabetes mellitus with diabetic neuropathy, with long-term current  use of insulin ([facility]) 10/05/2015  . Depression 10/05/2015  . 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 CHF (congestive heart failure) ([facility]) 03/13/2015  . Carotid stenosis 07/24/2014    Past Surgical History:  Procedure Laterality Date  . ABDOMINAL HYSTERECTOMY    . BREAST BIOPSY Right yrs ago   benign  . ENDARTERECTOMY Right 07/24/2014   Procedure: ENDARTERECTOMY CAROTID;  Surgeon: Annice Needy, MD;  Location: [facility] ORS;  Service: Vascular;  Laterality: Right;  . EYE SURGERY Left    cataract  . FRACTURE SURGERY Left    fractured ankle  . TONSILLECTOMY      Current Outpatient Rx  . Order #: [id number] Class: OTC  . Order #: [id number] Class: Normal  . Order #: [id number] Class: Historical Med  . Order #: [id number] Class: Historical Med  . Order #: [id number] Class: Historical Med  . Order #: [id number] Class: Historical Med  . Order #: [id number] Class: Historical Med  . Order #: [id number] Class: Normal  . Order #: [id number] Class: Historical Med  . Order #: [id number] Class: Historical Med  . Order #: [id number] Class: Historical Med  . Order #: [id number] Class: No Print  . Order #: [id number] Class: Historical Med  . Order #: [id number] Class: No Print  . Order #: [id number] Class: Historical Med  . Order #: [id number] Class: Historical Med  . Order #: [id number] Class: Historical Med  . Order #: [id number] Class: Normal  . Order #: [id number] Class: Print  . Order #: [id number] Class: Historical Med  . Order #: [id number] Class: Historical Med  . Order #: [id number] Class: Historical Med  . Order #: [id number] Class: No Print  .  Order #: [id number] Class: Normal  . Order #: [id number] Class: Normal    Allergies Codeine and Liraglutide  Family History  Problem Relation Age of Onset  . Pancreatic cancer Mother   . CAD Father   . Hypertension Father   . Pancreatic cancer Father   . Colon cancer Father   . CAD Brother   . Heart disease Brother     Heart attack  . Breast cancer Maternal Aunt     pt  states several maternal aunts    Social History Social History  Substance Use Topics  . Smoking status: Never Smoker  . Smokeless tobacco: Never Used  . Alcohol use No    Review of Systems Constitutional: No fever/chills.No lightheadedness or syncope. Positive generalized weakness. Eyes: No visual changes. No eye discharge. ENT: No sore throat. No congestion or rhinorrhea. Cardiovascular: Denies chest pain. Denies palpitations. Respiratory: Denies shortness of breath.  No cough. Gastrointestinal: No abdominal pain.  No nausea, no vomiting.  No diarrhea.  No constipation. Genitourinary: Negative for dysuria. Musculoskeletal: Negative for back pain. Skin: Negative for rash. Neurological: Negative for headaches. No focal numbness, tingling or weakness.   10-point ROS otherwise negative.  ____________________________________________   PHYSICAL EXAM:  VITAL SIGNS: ED Triage Vitals  Enc Vitals Group     BP 06/22/16 2216 (!) 62/28     Pulse Rate 06/22/16 2216 76     Resp 06/22/16 2216 15     Temp 06/22/16 2216 97.7 F (36.5 C)     Temp Source 06/22/16 2216 Oral     SpO2 06/22/16 2216 (!) 86 %     Weight 06/22/16 2219 253 lb 8.5 oz (115 kg)     Height 06/22/16 2219 5\' 2"  (1.575 m)     Head Circumference --      Peak Flow --      Pain Score 06/22/16 2215 2     Pain Loc --      Pain Edu? --      Excl. in GC? --     Constitutional: The patient is alert and answers questions appropriately. She is mildly somnolent but is able to stay awake during my examination. She is chronically ill-appearing. Eyes: Conjunctivae are normal.  EOMI. No scleral icterus. No eye discharge. Head: Atraumatic. Nose: No congestion/rhinnorhea. Mouth/Throat: Mucous membranes are dry.  Neck: No stridor.  Supple.  No meningismus. No JVD. Cardiovascular: Normal rate, regular rhythm. No murmurs, rubs or gallops. The patient's blood pressure is 58 over 30s on my examination. Respiratory: Mild tachypnea  without accessory muscle use or retractions. Lungs CTAB.  No wheezes, rales or ronchi. Gastrointestinal: Morbidly obese. Soft, nontender and nondistended.  No guarding or rebound.  No peritoneal signs. Musculoskeletal: No LE edema. No ttp in the calves or palpable cords.  Negative Homan's sign. Neurologic:  Alert.  Speech is clear.  Face and smile are symmetric.  EOMI.  Moves all extremities well. Skin:  Skin is warm, dry and intact. No rash noted. Psychiatric: Mood and affect are normal.  ____________________________________________   LABS (all labs ordered are listed, but only abnormal results are displayed)  Labs Reviewed  CBC - Abnormal; Notable for the following:       Result Value   WBC 12.9 (*)    RBC 3.33 (*)    Hemoglobin 10.3 (*)    HCT 30.8 (*)    RDW 16.0 (*)    All other components within normal limits  BLOOD GAS, VENOUS - Abnormal; Notable for the  following:    pCO2, Ven 61 (*)    Bicarbonate 37.8 (*)    Acid-Base Excess 10.9 (*)    All other components within normal limits  CULTURE, BLOOD (ROUTINE X 2)  CULTURE, BLOOD (ROUTINE X 2)  COMPREHENSIVE METABOLIC PANEL  AMMONIA  TROPONIN I  URINALYSIS, COMPLETE (UACMP) WITH MICROSCOPIC  LACTIC ACID, PLASMA  LACTIC ACID, PLASMA  BRAIN NATRIURETIC PEPTIDE   ____________________________________________  EKG  ED ECG REPORT I, Rockne Menghini, the attending physician, personally viewed and interpreted this ECG.   Date: 06/22/2016  EKG Time: 2218  Rate: 73  Rhythm: normal sinus rhythm with poor baseline tracing  Axis: leftward  Intervals:Prolonged QTc  ST&T Change: No STEMI  ____________________________________________  RADIOLOGY  No results found.  ____________________________________________   PROCEDURES  Procedure(s) performed: None  Procedures  Critical Care performed: Yes ____________________________________________   INITIAL IMPRESSION / ASSESSMENT AND PLAN / ED COURSE  Pertinent  labs & imaging results that were available during my care of the patient were reviewed by me and considered in my medical decision making (see chart for details).  71 y.o. female with multiple comorbidities including CHF, presenting for generalized weakness in the setting of hypoxia which is improving with supplemental oxygenation. On my examination, the patient is markedly hypotensive and her mucous membranes are dry, concerning that she has been over diuresis for her CHF. There is a nursing note from 3 nights ago the patient was hypoxic at night, but she was discharged home without oxygen. It is possible she has altered mental status related to hypercarbia. I will also evaluate her for infection, including urinary tract infection or pneumonia, blood cultures will also be obtained. We have initiated aggressive fluid resuscitation but we'll monitor her pulmonary status closely given her diastolic CHF area and plan admission.  ----------------------------------------- 10:50 PM on 06/22/2016 -----------------------------------------  The patient's blood pressures trending upward and has consistently been in the 90s after 400 cc of fluid. We will continue her fluid resuscitation. She is continuing to [facility] better. I have wean her oxygen down to 2 L nasal cannula and she continues to have oxygen saturations of 99% with this.  Her gas does show hypercarbia, but her pH is normal and her peripheral O2 sats are normal, so acute BiPAP is not indicated at this time.  ----------------------------------------- 11:13 PM on 06/22/2016 -----------------------------------------  At this time, the patient's blood pressure continues to be stable. Her chest x-ray is concerning for pneumonia and her lactic acid is greater than 3. I will initiate broad-spectrum antibiotics. I do not have her UA back yet. She is also hyponatremic and hypokalemic with hyperglycemia without DKA. She has a elevated troponin which will  need to be trended. Plan admission.  CRITICAL CARE Performed by: Rockne Menghini   Total critical care time: 45 minutes  Critical care time was exclusive of separately billable procedures and treating other patients.  Critical care was necessary to treat or prevent imminent or life-threatening deterioration.  Critical care was time spent personally by me on the following activities: development of treatment plan with patient and/or surrogate as well as nursing, discussions with consultants, evaluation of patient's response to treatment, examination of patient, obtaining history from patient or surrogate, ordering and performing treatments and interventions, ordering and review of laboratory studies, ordering and review of radiographic studies, pulse oximetry and re-evaluation of patient's condition.   ____________________________________________  FINAL CLINICAL IMPRESSION(S) / ED DIAGNOSES  Final diagnoses:  Acute respiratory failure with hypoxia and hypercarbia (HCC)  NEW MEDICATIONS STARTED DURING THIS VISIT:  New Prescriptions   No medications on file      Rockne Menghini, MD 06/22/16 2314

## 2016-06-23 ENCOUNTER — Inpatient Hospital Stay: Payer: Medicare Other

## 2016-06-23 DIAGNOSIS — N179 Acute kidney failure, unspecified: Secondary | ICD-10-CM

## 2016-06-23 DIAGNOSIS — J181 Lobar pneumonia, unspecified organism: Secondary | ICD-10-CM

## 2016-06-23 DIAGNOSIS — J90 Pleural effusion, not elsewhere classified: Secondary | ICD-10-CM

## 2016-06-23 DIAGNOSIS — A419 Sepsis, unspecified organism: Secondary | ICD-10-CM

## 2016-06-23 DIAGNOSIS — J9601 Acute respiratory failure with hypoxia: Secondary | ICD-10-CM

## 2016-06-23 LAB — CBC
HCT: 29.7 % — ABNORMAL LOW (ref 35.0–47.0)
Hemoglobin: 9.9 g/dL — ABNORMAL LOW (ref 12.0–16.0)
MCH: 31 pg (ref 26.0–34.0)
MCHC: 33.3 g/dL (ref 32.0–36.0)
MCV: 93 fL (ref 80.0–100.0)
Platelets: 204 10*3/uL (ref 150–440)
RBC: 3.19 MIL/uL — ABNORMAL LOW (ref 3.80–5.20)
RDW: 16.6 % — AB (ref 11.5–14.5)
WBC: 12.4 10*3/uL — ABNORMAL HIGH (ref 3.6–11.0)

## 2016-06-23 LAB — BASIC METABOLIC PANEL
ANION GAP: 10 (ref 5–15)
BUN: 29 mg/dL — AB (ref 6–20)
CALCIUM: 8.3 mg/dL — AB (ref 8.9–10.3)
CO2: 32 mmol/L (ref 22–32)
Chloride: 86 mmol/L — ABNORMAL LOW (ref 101–111)
Creatinine, Ser: 1.3 mg/dL — ABNORMAL HIGH (ref 0.44–1.00)
GFR calc Af Amer: 47 mL/min — ABNORMAL LOW (ref >60)
GFR, EST NON AFRICAN AMERICAN: 41 mL/min — AB (ref >60)
GLUCOSE: 378 mg/dL — AB (ref 65–99)
Potassium: 3.6 mmol/L (ref 3.5–5.1)
Sodium: 128 mmol/L — ABNORMAL LOW (ref 135–145)

## 2016-06-23 LAB — VANCOMYCIN, RANDOM
VANCOMYCIN RM: 52 — AB
VANCOMYCIN RM: 21
Vancomycin Rm: 32

## 2016-06-23 LAB — GLUCOSE, CAPILLARY
GLUCOSE-CAPILLARY: 361 mg/dL — AB (ref 65–99)
GLUCOSE-CAPILLARY: 214 mg/dL — AB (ref 65–99)
GLUCOSE-CAPILLARY: 200 mg/dL — AB (ref 65–99)
GLUCOSE-CAPILLARY: 262 mg/dL — AB (ref 65–99)
GLUCOSE-CAPILLARY: 349 mg/dL — AB (ref 65–99)
Glucose-Capillary: 326 mg/dL — ABNORMAL HIGH (ref 65–99)

## 2016-06-23 LAB — TROPONIN I
TROPONIN I: 0.03 ng/mL — AB (ref <0.03)
Troponin I: 0.03 ng/mL (ref <0.03)
Troponin I: 0.03 ng/mL (ref <0.03)

## 2016-06-23 LAB — PHOSPHORUS: Phosphorus: 3.8 mg/dL (ref 2.5–4.6)

## 2016-06-23 LAB — BLOOD GAS, VENOUS
ACID-BASE EXCESS: 10.9 mmol/L — AB (ref 0.0–2.0)
BICARBONATE: 37.8 mmol/L — AB (ref 20.0–28.0)
Patient temperature: 37
pCO2, Ven: 61 mmHg — ABNORMAL HIGH (ref 44.0–60.0)
pH, Ven: 7.4 (ref 7.250–7.430)

## 2016-06-23 LAB — PROTIME-INR
INR: 1.17
PROTHROMBIN TIME: 15 s (ref 11.4–15.2)

## 2016-06-23 LAB — APTT: aPTT: 28 seconds (ref 24–36)

## 2016-06-23 LAB — MAGNESIUM: Magnesium: 1.7 mg/dL (ref 1.7–2.4)

## 2016-06-23 LAB — LACTIC ACID, PLASMA: LACTIC ACID, VENOUS: 1.7 mmol/L (ref 0.5–1.9)

## 2016-06-23 LAB — PROCALCITONIN: PROCALCITONIN: 0.43 ng/mL

## 2016-06-23 LAB — MRSA PCR SCREENING: MRSA by PCR: NEGATIVE

## 2016-06-23 MED ORDER — DAPSONE 25 MG PO TABS
150.0000 mg | ORAL_TABLET | Freq: Every day | ORAL | Status: DC
  Filled 2016-06-23: qty 2

## 2016-06-23 MED ORDER — CHLORHEXIDINE GLUCONATE 0.12 % MT SOLN
15.0000 mL | Freq: Two times a day (BID) | OROMUCOSAL | Status: DC
  Administered 2016-06-23 – 2016-06-24 (×2): 15 mL via OROMUCOSAL
  Filled 2016-06-23 (×2): qty 15

## 2016-06-23 MED ORDER — ONDANSETRON HCL 4 MG/2ML IJ SOLN: 4.0000 mg | Freq: Four times a day (QID) | INTRAMUSCULAR | Status: DC | PRN

## 2016-06-23 MED ORDER — VITAMIN D 1000 UNITS PO TABS
1000.0000 [IU] | ORAL_TABLET | Freq: Every day | ORAL | Status: DC
  Administered 2016-06-23 – 2016-06-25 (×3): 1000 [IU] via ORAL
  Filled 2016-06-23 (×3): qty 1

## 2016-06-23 MED ORDER — GABAPENTIN 400 MG PO CAPS
800.0000 mg | ORAL_CAPSULE | Freq: Two times a day (BID) | ORAL | Status: DC
  Administered 2016-06-23 – 2016-06-25 (×5): 800 mg via ORAL
  Filled 2016-06-23 (×6): qty 2

## 2016-06-23 MED ORDER — ORAL CARE MOUTH RINSE
15.0000 mL | Freq: Two times a day (BID) | OROMUCOSAL | Status: DC
  Administered 2016-06-23: 15 mL via OROMUCOSAL

## 2016-06-23 MED ORDER — ONDANSETRON HCL 4 MG PO TABS: 4.0000 mg | ORAL_TABLET | Freq: Four times a day (QID) | ORAL | Status: DC | PRN

## 2016-06-23 MED ORDER — BISACODYL 5 MG PO TBEC: 5.0000 mg | DELAYED_RELEASE_TABLET | Freq: Every day | ORAL | Status: DC | PRN

## 2016-06-23 MED ORDER — POTASSIUM CHLORIDE CRYS ER 20 MEQ PO TBCR
40.0000 meq | EXTENDED_RELEASE_TABLET | Freq: Two times a day (BID) | ORAL | Status: DC
  Administered 2016-06-23 (×2): 40 meq via ORAL
  Filled 2016-06-23 (×3): qty 2

## 2016-06-23 MED ORDER — SENNOSIDES-DOCUSATE SODIUM 8.6-50 MG PO TABS: 1.0000 | ORAL_TABLET | Freq: Every evening | ORAL | Status: DC | PRN

## 2016-06-23 MED ORDER — PANTOPRAZOLE SODIUM 40 MG PO TBEC
40.0000 mg | DELAYED_RELEASE_TABLET | Freq: Every day | ORAL | Status: DC
  Administered 2016-06-23 – 2016-06-25 (×3): 40 mg via ORAL
  Filled 2016-06-23 (×4): qty 1

## 2016-06-23 MED ORDER — FERROUS SULFATE 325 (65 FE) MG PO TABS
325.0000 mg | ORAL_TABLET | Freq: Every day | ORAL | Status: DC
  Administered 2016-06-23 – 2016-06-25 (×3): 325 mg via ORAL
  Filled 2016-06-23 (×3): qty 1

## 2016-06-23 MED ORDER — ACETAMINOPHEN 650 MG RE SUPP: 650.0000 mg | Freq: Four times a day (QID) | RECTAL | Status: DC | PRN

## 2016-06-23 MED ORDER — GABAPENTIN 800 MG PO TABS
800.0000 mg | ORAL_TABLET | Freq: Two times a day (BID) | ORAL | Status: DC
  Filled 2016-06-23 (×2): qty 1

## 2016-06-23 MED ORDER — SODIUM CHLORIDE 0.9 % IV SOLN: INTRAVENOUS | Status: DC

## 2016-06-23 MED ORDER — METHYLPREDNISOLONE SODIUM SUCC 125 MG IJ SOLR
60.0000 mg | Freq: Four times a day (QID) | INTRAMUSCULAR | Status: AC
  Administered 2016-06-23 (×4): 60 mg via INTRAVENOUS
  Filled 2016-06-23 (×4): qty 2

## 2016-06-23 MED ORDER — PRO-STAT SUGAR FREE PO LIQD
30.0000 mL | Freq: Every day | ORAL | Status: DC
  Administered 2016-06-23 – 2016-06-24 (×2): 30 mL via ORAL

## 2016-06-23 MED ORDER — SODIUM CHLORIDE 0.9 % IV BOLUS (SEPSIS)
500.0000 mL | Freq: Once | INTRAVENOUS | Status: AC
  Administered 2016-06-23: 500 mL via INTRAVENOUS

## 2016-06-23 MED ORDER — IPRATROPIUM BROMIDE 0.02 % IN SOLN: 0.5000 mg | Freq: Four times a day (QID) | RESPIRATORY_TRACT | Status: DC | PRN

## 2016-06-23 MED ORDER — PRAVASTATIN SODIUM 20 MG PO TABS
20.0000 mg | ORAL_TABLET | Freq: Every day | ORAL | Status: DC
  Administered 2016-06-23 – 2016-06-24 (×2): 20 mg via ORAL
  Filled 2016-06-23 (×2): qty 1

## 2016-06-23 MED ORDER — SODIUM CHLORIDE 0.9% FLUSH
3.0000 mL | Freq: Two times a day (BID) | INTRAVENOUS | Status: DC
  Administered 2016-06-23 – 2016-06-24 (×5): 3 mL via INTRAVENOUS

## 2016-06-23 MED ORDER — VANCOMYCIN HCL IN DEXTROSE 1-5 GM/200ML-% IV SOLN: 1000.0000 mg | Freq: Once | INTRAVENOUS | Status: DC

## 2016-06-23 MED ORDER — DEXTROSE 5 % IV SOLN
2.0000 g | Freq: Two times a day (BID) | INTRAVENOUS | Status: DC
  Administered 2016-06-23: 2 g via INTRAVENOUS
  Filled 2016-06-23 (×4): qty 2

## 2016-06-23 MED ORDER — ADULT MULTIVITAMIN W/MINERALS CH
1.0000 | ORAL_TABLET | Freq: Every day | ORAL | Status: DC
  Administered 2016-06-23 – 2016-06-24 (×2): 1 via ORAL
  Filled 2016-06-23 (×3): qty 1

## 2016-06-23 MED ORDER — FOLIC ACID 1 MG PO TABS
1.0000 mg | ORAL_TABLET | Freq: Every day | ORAL | Status: DC
  Administered 2016-06-23 – 2016-06-25 (×3): 1 mg via ORAL
  Filled 2016-06-23 (×3): qty 1

## 2016-06-23 MED ORDER — POTASSIUM CHLORIDE CRYS ER 20 MEQ PO TBCR: 40.0000 meq | EXTENDED_RELEASE_TABLET | Freq: Two times a day (BID) | ORAL | Status: DC

## 2016-06-23 MED ORDER — ALBUTEROL SULFATE (2.5 MG/3ML) 0.083% IN NEBU: 2.5000 mg | INHALATION_SOLUTION | Freq: Four times a day (QID) | RESPIRATORY_TRACT | Status: DC | PRN

## 2016-06-23 MED ORDER — RAMIPRIL 2.5 MG PO CAPS
2.5000 mg | ORAL_CAPSULE | Freq: Every day | ORAL | Status: DC
  Filled 2016-06-23: qty 1

## 2016-06-23 MED ORDER — INSULIN GLARGINE 100 UNIT/ML ~~LOC~~ SOLN
20.0000 [IU] | Freq: Every day | SUBCUTANEOUS | Status: DC
  Administered 2016-06-23 (×2): 20 [IU] via SUBCUTANEOUS
  Filled 2016-06-23 (×3): qty 0.2

## 2016-06-23 MED ORDER — ASPIRIN EC 81 MG PO TBEC
81.0000 mg | DELAYED_RELEASE_TABLET | Freq: Every day | ORAL | Status: DC
  Administered 2016-06-23 – 2016-06-25 (×3): 81 mg via ORAL
  Filled 2016-06-23 (×3): qty 1

## 2016-06-23 MED ORDER — CEFEPIME HCL 2 G IJ SOLR
2.0000 g | Freq: Once | INTRAMUSCULAR | Status: AC
  Administered 2016-06-23: 2 g via INTRAVENOUS
  Filled 2016-06-23: qty 2

## 2016-06-23 MED ORDER — SODIUM CHLORIDE 0.9 % IV SOLN
0.0000 ug/min | INTRAVENOUS | Status: DC
  Administered 2016-06-23: 25 ug/min via INTRAVENOUS
  Filled 2016-06-23 (×2): qty 1

## 2016-06-23 MED ORDER — PREDNISONE 10 MG PO TABS: 10.0000 mg | ORAL_TABLET | Freq: Every day | ORAL | Status: DC

## 2016-06-23 MED ORDER — MAGNESIUM CITRATE PO SOLN
1.0000 | Freq: Once | ORAL | Status: DC | PRN
  Filled 2016-06-23: qty 296

## 2016-06-23 MED ORDER — POTASSIUM CHLORIDE IN NACL 40-0.9 MEQ/L-% IV SOLN
INTRAVENOUS | Status: DC
  Administered 2016-06-23: 50 mL/h via INTRAVENOUS
  Filled 2016-06-23 (×4): qty 1000

## 2016-06-23 MED ORDER — ACETAMINOPHEN 325 MG PO TABS
650.0000 mg | ORAL_TABLET | Freq: Four times a day (QID) | ORAL | Status: DC | PRN
Start: 2016-06-23 — End: 2016-06-25

## 2016-06-23 MED ORDER — INSULIN ASPART 100 UNIT/ML ~~LOC~~ SOLN
0.0000 [IU] | SUBCUTANEOUS | Status: DC
  Administered 2016-06-23: 4 [IU] via SUBCUTANEOUS
  Administered 2016-06-23: 20 [IU] via SUBCUTANEOUS
  Administered 2016-06-23: 11 [IU] via SUBCUTANEOUS
  Administered 2016-06-23: 7 [IU] via SUBCUTANEOUS
  Administered 2016-06-23 (×2): 15 [IU] via SUBCUTANEOUS
  Administered 2016-06-24 (×2): 4 [IU] via SUBCUTANEOUS
  Administered 2016-06-24 (×3): 7 [IU] via SUBCUTANEOUS
  Administered 2016-06-24: 11 [IU] via SUBCUTANEOUS
  Administered 2016-06-25: 4 [IU] via SUBCUTANEOUS
  Filled 2016-06-23: qty 4
  Filled 2016-06-23: qty 11
  Filled 2016-06-23 (×3): qty 7
  Filled 2016-06-23: qty 4
  Filled 2016-06-23: qty 11
  Filled 2016-06-23: qty 4
  Filled 2016-06-23: qty 20
  Filled 2016-06-23: qty 15
  Filled 2016-06-23: qty 4
  Filled 2016-06-23: qty 7
  Filled 2016-06-23: qty 15

## 2016-06-23 MED ORDER — DEXTROSE 5 % IV SOLN
2.0000 g | INTRAVENOUS | Status: DC
  Filled 2016-06-23: qty 2

## 2016-06-23 MED ORDER — ENOXAPARIN SODIUM 30 MG/0.3ML ~~LOC~~ SOLN
30.0000 mg | Freq: Two times a day (BID) | SUBCUTANEOUS | Status: DC
  Administered 2016-06-23 – 2016-06-24 (×5): 30 mg via SUBCUTANEOUS
  Filled 2016-06-23 (×5): qty 0.3

## 2016-06-23 NOTE — ED Notes (Signed)
Report called to RN in CCU pt admitted and transported.

## 2016-06-23 NOTE — Progress Notes (Signed)
[facility] - [place] at [facility]   PATIENT NAME: [name]    MR#:  [id number]  DATE OF BIRTH:  1945/06/21  SUBJECTIVE:  Came in with increasing SOB and weakness. Found to have pnuemonia  REVIEW OF SYSTEMS:   Review of Systems  Constitutional: Negative for chills, fever and weight loss.  HENT: Negative for ear discharge, ear pain and nosebleeds.   Eyes: Negative for blurred vision, pain and discharge.  Respiratory: Positive for shortness of breath. Negative for sputum production, wheezing and stridor.   Cardiovascular: Negative for chest pain, palpitations, orthopnea and PND.  Gastrointestinal: Negative for abdominal pain, diarrhea, nausea and vomiting.  Genitourinary: Negative for frequency and urgency.  Musculoskeletal: Negative for back pain and joint pain.  Neurological: Positive for weakness. Negative for sensory change, speech change and focal weakness.  Psychiatric/Behavioral: Negative for depression and hallucinations. The patient is not nervous/anxious.    Tolerating Diet:yes Tolerating PT: pending  DRUG ALLERGIES:   Allergies  Allergen Reactions  . Codeine Other (See Comments)    Reaction:  Dizziness   . Liraglutide     Other reaction(s): nausea and stomach pain    VITALS:  Blood pressure (!) 104/57, pulse 74, temperature 99 F (37.2 C), temperature source Oral, resp. rate (!) 8, height 5\' 2"  (1.575 m), weight 100 kg (220 lb 7.4 oz), SpO2 93 %.  PHYSICAL EXAMINATION:   Physical Exam  GENERAL:  71 y.o.-year-old patient lying in the bed with no acute distress.  EYES: Pupils equal, round, reactive to light and accommodation. No scleral icterus. Extraocular muscles intact.  HEENT: Head atraumatic, normocephalic. Oropharynx and nasopharynx clear.  NECK:  Supple, no jugular venous distention. No thyroid enlargement, no tenderness.  LUNGS: decreased breath sounds bilaterally, no wheezing, rales, rhonchi. No use of accessory muscles of  respiration.  CARDIOVASCULAR: S1, S2 normal. No murmurs, rubs, or gallops.  ABDOMEN: Soft, nontender, nondistended. Bowel sounds present. No organomegaly or mass.  EXTREMITIES: No cyanosis, clubbing or edema b/l.    NEUROLOGIC: Cranial nerves II through XII are intact. No focal Motor or sensory deficits b/l.   PSYCHIATRIC:  patient is alert and oriented x 3.  SKIN: No obvious rash, lesion, or ulcer.   LABORATORY PANEL:  CBC  Recent Labs Lab 06/23/16 0120  WBC 12.4*  HGB 9.9*  HCT 29.7*  PLT 204    Chemistries   Recent Labs Lab 06/22/16 2225 06/23/16 0120  NA 124* 128*  K 3.0* 3.6  CL 78* 86*  CO2 34* 32  GLUCOSE 389* 378*  BUN 33* 29*  CREATININE 1.66* 1.30*  CALCIUM 9.1 8.3*  MG  --  1.7  AST 60*  --   ALT 40  --   ALKPHOS 152*  --   BILITOT 2.1*  --    Cardiac Enzymes  Recent Labs Lab 06/23/16 1322  TROPONINI 0.03*   RADIOLOGY:  Dg Chest Portable 1 View  Result Date: 06/22/2016 CLINICAL DATA:  Acute onset of generalized weakness. Low blood pressure. Initial encounter. EXAM: PORTABLE CHEST 1 VIEW COMPARISON:  Chest radiograph performed 05/30/2016 FINDINGS: The lungs are mildly hypoexpanded. A small left pleural effusion is noted. Left basilar airspace opacity raises concern for pneumonia. Mild right basilar atelectasis is seen. No pneumothorax is seen. The cardiomediastinal silhouette is within normal limits. No acute osseous abnormalities are seen. IMPRESSION: Lungs mildly hypoexpanded. Small left pleural effusion noted. Left basilar airspace opacity raises concern for pneumonia. Mild right basilar atelectasis seen. Followup PA and lateral  chest X-ray is recommended in 3-4 weeks following trial of antibiotic therapy to ensure resolution and exclude underlying malignancy. Electronically Signed   By: Roanna Raider M.D.   On: 06/22/2016 23:06   US Abdomen Limited Ruq  Result Date: 06/23/2016 CLINICAL DATA:  Abdominal pain EXAM: US ABDOMEN LIMITED - RIGHT UPPER  QUADRANT COMPARISON:  CT 03/09/2016 FINDINGS: Gallbladder: Calcified gallstone at the neck. Although shadowing is difficult to detect, stone is calcified by CT. No wall thickening or focal tenderness. Common bile duct: Diameter: 5 mm.  Where visualized, no filling defect. Liver: Cirrhotic liver morphology. No mass lesion is seen. Antegrade flow in the imaged hepatic and portal venous system. Trace peritoneal fluid. IMPRESSION: 1. Cholelithiasis without evidence of cholecystitis. 2. Cirrhosis. Electronically Signed   By: Marnee Spring M.D.   On: 06/23/2016 10:03   ASSESSMENT AND PLAN:   71 y.o. female with a history of Anemia, asthma, CHF, CVA, diabetes, GERD, hypertension, peripheral vascular disease  now being admitted with:  #. Sepsis, likely multifactorial secondary to pneumonia, urinary tract infection and possibly intra-abdominal infection - IV antibiotics: Maxipime - Gentle IV fluid hydration, may need pressors - Follow up blood culture- negative  -  right upper quadrant abdominal ultrasound shows calcified stone at GB neck  #. Elevated bilirubin with right upper quadrant abdominal pain, history of gallstones -ight upper quadrant ultrasound as above  #. AK I, likely secondary to dehydration possible overdiuresis -Patient received fluid boluses in the emergency department -We'll continue gentle IV fluid hydration -Hold Altase   #. Acute hypoxic respiratory failure, likely secondary to pneumonia. History of congestive heart failure -Continue O2, nebulizers, expectorants. -BiPAP as needed -Assess the need for home O2 prior to discharge -Resume torsemide when blood pressure is more stable  #.  hyponatremia and hypokalemia -Replete as able.  #. Elevated troponin, likely secondary to demand ischemia in the setting of sepsis and AK I  - monitor on telemetry and trend troponins   #. History oAnemia, chronic and stable  -Continue iron  #. H/o Diabetes - Accuchecks achs with  RISS coverage - Heart healthy, carb controlled diet -Continue Lantus  #. History of hyperlipidemia -Continue Mevacor  #. History of hypertension -Hold antihypertensives at this time    Case discussed with Care Management/Social Worker. Management plans discussed with the patient, family and they are in agreement.  CODE STATUS: full  DVT Prophylaxis: lovenox  TOTAL TIME TAKING CARE OF THIS PATIENT: 30 minutes.  >50% time spent on counselling and coordination of care  POSSIBLE D/C IN 1-2* DAYS, DEPENDING ON CLINICAL CONDITION.  Note: This dictation was prepared with Dragon dictation along with smaller phrase technology. Any transcriptional errors that result from this process are unintentional.  Amaka Gluth M.D on 06/23/2016 at 3:30 PM  Between 7am to 6pm - Pager - [phone]  After 6pm go to [email or web address] - Social research officer, government  [facility]  Office  [phone]  CC: Primary care physician; Rennie Plowman, FNP

## 2016-06-23 NOTE — Consult Note (Signed)
WOC Nurse wound consult note Reason for Consult:Moisture associated skin damage to left abdominal pannus.  Wound type:MASD to skin folds.  Skin tear to right anterior pretibial leg from trauma.  Occlusive dressing place.   Pressure Injury POA: N/A Measurement:0.2 cm x 4 cm x 0.2 cm  Wound RFF:MBWG and moist Drainage (amount, consistency, odor)  Periwound:intact Dressing procedure/placement/frequency: Cleanse right lower leg wound with Ns and pat gently dry. Apply calcium alginate to wound bed and cover with transparent film dressing.  Change weekly on Friday.  Interdry Ag to left abdominal pannus.  Avoid disposable briefs and underpads.  Measure and cut length of InterDry Ag+ to fit in skin folds that have skin breakdown Tuck InterDry  Ag+ fabric into skin folds in a single layer, allow for 2 inches of overhang from skin edges to allow for wicking to occur May remove to bathe; dry area thoroughly and then tuck into affected areas again Do not apply any creams or ointments when using InterDry Ag+ DO NOT THROW AWAY FOR 5 DAYS unless soiled with stool DO NOT [facility] product, this will inactivate the silver in the material  New sheet of Interdry Ag+ should be applied after 5 days of use if patient continues to have skin breakdown     Will not follow at this time.  Please re-consult if needed.  Maple Hudson RN BSN CWON Pager [phone]

## 2016-06-23 NOTE — Evaluation (Signed)
Physical Therapy Evaluation Patient Details Name: [name] MRN: [id number] DOB: [birth year] Today's Date: 06/23/2016   History of Present Illness  Pt is a 71 y.o.femalewith a known history of Anemia, asthma, CHF, CVA, diabetes, GERD, hypertension, peripheral vascular diseasepresents to the emergency department for evaluation of weakness. Patient was hospitalized in this facility for acute on chronic CHF exacerbation, acute respiratory failure with hypoxia and urinary tract infection. She was discharged to rehabilitation about 2 weeks ago where she has been doing well. She was discharged home however her caregiver states that upon arrival home she was very weak, lethargic complained of generalized abdominal pain, vomited once and became profoundly weak. On arrival to the emergency department she was significantly hypotensive with a blood pressure 62/28 and hypoxic with an O2 sat of 86% on room air. She has recently been on antibiotics for a presumed urinary tract infection and plan to have blood work repeated tomorrow. Caregiver states she has been eating and drinking normally and had been doing well in rehabilitation prior to this episode today.  Assessment includes: LLL PNA with sepsis, lactic acidosis, hypotension, L pleural effusion, R basilar atelectasis, AKI, hypnatremia, hypokalemia, mildly elevated troponis secondary to demand ischemia, and DM.  Pt with thoracentesis 06/22/16.    Clinical Impression  Pt presents with deficits in strength, transfers, mobility, gait, balance, and activity tolerance.  Pt required +2 Max A with bed mobility tasks and once in sitting required frequent assist to prevent posterior LOB.  Pt performed sit to/from stand with RW and Mod A.  Of note, pt stood on large portable step from rehab gym secondary to concerns that even with bed in lowest position pt may have difficulty sitting back to EOB secondary to short stature.  Once in standing pt able to maintain  stability with +2 CGA for safety.  With RW stationary on step pt able to take on small step forward/backward x 2 before fatiguing to the point that she needed to return to sitting.  Pt will benefit from PT services at a SNF setting to safely address above deficits for eventual return home with decreased caregiver assistance.      Follow Up Recommendations SNF    Equipment Recommendations  None recommended by PT    Recommendations for Other Services       Precautions / Restrictions Precautions Precautions: Fall Restrictions Weight Bearing Restrictions: No      Mobility  Bed Mobility Overal bed mobility: Needs Assistance Bed Mobility: Supine to Sit;Sit to Supine     Supine to sit: +2 for physical assistance;Max assist Sit to supine: Max assist;+2 for physical assistance   General bed mobility comments: pt is too weak to control sitting balance at EOB and varies from SBA to mod A to prevent posterior LOB.  Transfers Overall transfer level: Needs assistance Equipment used: Rolling walker (2 wheeled) Transfers: Sit to/from Stand Sit to Stand: Mod assist;+2 safety/equipment         General transfer comment: Pt stood on large step from rehab gym at EOB secondary to concerns pt would have trouble sitting back to EOB secondary to pt's height   Ambulation/Gait Ambulation/Gait assistance: Min guard;+2 safety/equipment Ambulation Distance (Feet): 2 Feet Assistive device: Rolling walker (2 wheeled) Gait Pattern/deviations: Step-to pattern   Gait velocity interpretation: Below normal speed for age/gender General Gait Details: One small step forward/backward x 2 at EOB with RW stationary on portable step from rehab gym  Stairs  Wheelchair Mobility    Modified Rankin (Stroke Patients Only)       Balance Overall balance assessment: Needs assistance Sitting-balance support: Bilateral upper extremity supported Sitting balance-Leahy Scale: Poor Sitting  balance - Comments: Frequent posterior LOB in sitting at EOB Postural control: Posterior lean Standing balance support: Bilateral upper extremity supported Standing balance-Leahy Scale: Fair                               Pertinent Vitals/Pain Pain Assessment: No/denies pain    Home Living Family/patient expects to be discharged to:: Private residence Living Arrangements: Alone Available Help at Discharge: Personal care attendant;Available PRN/intermittently Type of Home: House Home Access: Ramped entrance     Home Layout: One level Home Equipment: Walker - 2 wheels;Walker - 4 wheels;Wheelchair - manual      Prior Function Level of Independence: Needs assistance   Gait / Transfers Assistance Needed: Amb with rollator and SBA limited community distances, w/c for longer community distances  ADL's / Homemaking Assistance Needed: Pt. has assistance approx. 12hr/daily to assist with ADL's         Hand Dominance   Dominant Hand: Right    Extremity/Trunk Assessment   Upper Extremity Assessment Upper Extremity Assessment: Generalized weakness    Lower Extremity Assessment Lower Extremity Assessment: Generalized weakness       Communication   Communication: No difficulties  Cognition Arousal/Alertness: Awake/alert Behavior During Therapy: WFL for tasks assessed/performed Overall Cognitive Status: Within Functional Limits for tasks assessed                                        General Comments      Exercises Total Joint Exercises Ankle Circles/Pumps: Strengthening;Both;5 reps;10 reps Quad Sets: Strengthening;Both;10 reps;5 reps Heel Slides: AAROM;Both;10 reps Hip ABduction/ADduction: AAROM;Both;10 reps Straight Leg Raises: AAROM;Both;10 reps Long Arc Quad: AROM;Both;10 reps Knee Flexion: AROM;Both;10 reps   Assessment/Plan    PT Assessment Patient needs continued PT services  PT Problem List Decreased strength;Decreased activity  tolerance;Decreased balance;Decreased mobility       PT Treatment Interventions Gait training;Functional mobility training;Neuromuscular re-education;Balance training;Therapeutic exercise;Therapeutic activities;Patient/family education    PT Goals (Current goals can be found in the Care Plan section)  Acute Rehab PT Goals Patient Stated Goal: To walk better PT Goal Formulation: With patient Time For Goal Achievement: 07/06/16 Potential to Achieve Goals: Good    Frequency Min 2X/week   Barriers to discharge        Co-evaluation               End of Session Equipment Utilized During Treatment: Gait belt;Oxygen Activity Tolerance: Patient limited by fatigue Patient left: in bed;with call bell/phone within reach;with SCD's reapplied Nurse Communication: Mobility status PT Visit Diagnosis: Difficulty in walking, not elsewhere classified (R26.2);Muscle weakness (generalized) (M62.81)    Time: 4656-8127 PT Time Calculation (min) (ACUTE ONLY): 36 min   Charges:   PT Evaluation $PT Eval Moderate Complexity: 1 Procedure PT Treatments $Therapeutic Exercise: 8-22 mins   PT G Codes:        DElly Modena PT, DPT 06/23/16, 12:25 PM

## 2016-06-23 NOTE — Progress Notes (Addendum)
Name: [name] MRN: [id number] DOB: 1945/11/02    ADMISSION DATE:  06/22/2016   Referring: Dr. Ara Kussmaul for dyspnea and weakness.   CHIEF COMPLAINT:  Generalized  weakness  BRIEF PATIENT DESCRIPTION:  71 year old female with multiple co-morbidities now presenting with HCAP  SIGNIFICANT EVENTS  4/2 admitted with generalized weakness 4/5 Discharged to Rehab 4/25 Discharged home from [place] 4/25 readmitted to [facility]  With HCAP  STUDIES:  05/31/16 ECHO>>Left ventricle: Wall thickness was increased in a pattern of mild   LVH. Systolic function was normal. The estimated ejection   fraction was in the range of 60% to 65%   HISTORY OF PRESENT ILLNESS:  Danique Hartsough is a 71 year old female with known history of Anemia,Asthma,CHF,CVA,Diabetes,GERD, HTN,PVD and restless leg syndrome.  Patient recently came for evaluation of weakness and  was recently admitted from 05/30/16 - 06/02/16 for Acute on chronic CHF exacerbation and respiratory failure.Patient was discharged to rehab.  Patient was discharged home on 4/25.  Patient presents to [facility] ED on 4/25 with increased weakness and abdominal pain.  She vomited once.  Upon arrival to ED she was hypotensive . CXR was concerning for LLL opacity concerning for HCAP. Patient denies any fever, chills, nausea and Vomiting.  Patient was started on broad spectrum antibiotics and was sent to the ICU for observation.  PAST MEDICAL HISTORY :   has a past medical history of Anemia; Asthma; CHF (congestive heart failure) ([place]); CVA (cerebral vascular accident) ([place]); Diabetes mellitus without complication ([place]); GERD (gastroesophageal reflux disease); Heart murmur; History of hiatal hernia; Hypertension; Panic attacks; Peripheral vascular disease ([place]); Restless leg syndrome; and Shortness of breath dyspnea.  has a past surgical history that includes Abdominal hysterectomy; Fracture surgery (Left); Tonsillectomy; Eye surgery (Left); Breast biopsy (Right, yrs ago);  and Endarterectomy (Right, 07/24/2014). Prior to Admission medications   Medication Sig Start Date End Date Taking? Authorizing Provider  acetaminophen (TYLENOL) 325 MG tablet Take 2 tablets (650 mg total) by mouth every 6 (six) hours as needed for mild pain (or Fever >/= 101). 12/14/15  Yes Nicholes Mango, MD  albuterol (PROVENTIL) (2.5 MG/3ML) 0.083% nebulizer solution Take 3 mLs (2.5 mg total) by nebulization every 4 (four) hours as needed for wheezing. 12/14/15  Yes Nicholes Mango, MD  Amino Acids-Protein Hydrolys (FEEDING SUPPLEMENT, PRO-STAT SUGAR FREE 64,) LIQD Take 30 mLs by mouth. 30 ml daily for 30 days 06/16/16  Yes Historical Provider, MD  aspirin EC 81 MG tablet Take 81 mg by mouth daily.   Yes Historical Provider, MD  Cholecalciferol 1000 units capsule Take 1,000 Units by mouth daily.   Yes Historical Provider, MD  Cyanocobalamin (B-12) 1000 MCG/ML KIT Inject 1,000 mcg as directed every 30 (thirty) days. 02/02/16  Yes Historical Provider, MD  dapsone 100 MG tablet Take 150 mg by mouth daily.    Yes Historical Provider, MD  ferrous sulfate 325 (65 FE) MG tablet Take 1 tablet (325 mg total) by mouth daily with breakfast. 11/03/15  Yes Burnard Hawthorne, FNP  folic acid (FOLVITE) 1 MG tablet Take 1 mg by mouth daily. Take one pill daily on Tuesday, Wednesday, Thursday, Friday, Saturday and Sunday. These are days were patient isn't taking methotrexate   Yes Historical Provider, MD  gabapentin (NEURONTIN) 800 MG tablet Take 800 mg by mouth 2 (two) times daily.   Yes Historical Provider, MD  hydrochlorothiazide (HYDRODIURIL) 12.5 MG tablet Take 12.5 mg by mouth 2 (two) times daily.    Yes Historical  Provider, MD  insulin aspart (NOVOLOG) 100 UNIT/ML injection Inject 10 Units into the skin 3 (three) times daily before meals. 301-350+ 10 units, 351 or higher NOTIFY MD   Yes Historical Provider, MD  insulin glargine (LANTUS) 100 UNIT/ML injection Inject 0.2 mLs (20 Units total) into the skin at bedtime.  06/02/16  Yes Demetrios Loll, MD  lovastatin (MEVACOR) 20 MG tablet Take 20 mg by mouth at bedtime.   Yes Historical Provider, MD  methotrexate (RHEUMATREX) 2.5 MG tablet Take 10 mg by mouth once a week. Patient takes on Monday. Caution:Chemotherapy. Protect from light.   Yes Historical Provider, MD  Multiple Vitamins-Minerals (MULTIVITAL) tablet Take 1 tablet by mouth daily.   Yes Historical Provider, MD  omeprazole (PRILOSEC) 20 MG capsule Take 1 capsule (20 mg total) by mouth at bedtime. 03/09/16  Yes Burnard Hawthorne, FNP  potassium chloride SA (K-DUR,KLOR-CON) 20 MEQ tablet Take 2 tablets (40 mEq total) by mouth 2 (two) times daily. 06/02/16  Yes Demetrios Loll, MD  predniSONE (DELTASONE) 10 MG tablet Take 10 mg by mouth daily with breakfast. Until seen by dermatologist   Yes Historical Provider, MD  ramipril (ALTACE) 2.5 MG capsule Take 1 capsule (2.5 mg total) by mouth daily. 06/03/16  Yes Demetrios Loll, MD  torsemide (DEMADEX) 20 MG tablet Take 2 tablets (40 mg total) by mouth 2 (two) times daily. 05/30/16 06/29/16 Yes Alisa Graff, FNP  Wound Dressings (SILVASORB) GEL Apply 1 application topically 2 (two) times daily as needed. 04/25/16  Yes Burnard Hawthorne, FNP   Allergies  Allergen Reactions  . Codeine Other (See Comments)    Reaction:  Dizziness   . Liraglutide     Other reaction(s): nausea and stomach pain    FAMILY HISTORY:  family history includes Breast cancer in her maternal aunt; CAD in her brother and father; Colon cancer in her father; Heart disease in her brother; Hypertension in her father; Pancreatic cancer in her father and mother. SOCIAL HISTORY:  reports that she has never smoked. She has never used smokeless tobacco. She reports that she does not drink alcohol or use drugs.  REVIEW OF SYSTEMS:   Constitutional: Negative for fever, chills, weight loss, malaise/fatigue and diaphoresis.  HENT: Negative for hearing loss, ear pain, nosebleeds, congestion, sore throat, neck pain, tinnitus  and ear discharge.   Eyes: Negative for blurred vision, double vision, photophobia, pain, discharge and redness.  Respiratory: Negative for cough, hemoptysis, sputum production, shortness of breath, wheezing and stridor.   Cardiovascular: Negative for chest pain, palpitations, orthopnea, claudication, leg swelling and PND.  Gastrointestinal: Negative for heartburn, nausea, vomiting, abdominal pain, diarrhea, constipation, blood in stool and melena.  Genitourinary: Negative for dysuria, urgency, frequency, hematuria and flank pain.  Musculoskeletal: Negative for myalgias, back pain, joint pain and falls.  Skin: Negative for itching and rash.  Neurological: Negative for dizziness, tingling, tremors, sensory change, speech change, focal weakness, seizures, loss of consciousness, weakness and headaches.  Endo/Heme/Allergies: Negative for environmental allergies and polydipsia. Does not bruise/bleed easily.  SUBJECTIVE: Patient states "that she was doing well in the rehab but when she went home she was feeling  Very week".  VITAL SIGNS: Temp:  [97.7 F (36.5 C)] 97.7 F (36.5 C) (04/25 2216) Pulse Rate:  [68-81] 71 (04/26 0030) Resp:  [10-22] 15 (04/26 0030) BP: (62-113)/(28-61) 96/53 (04/26 0030) SpO2:  [86 %-99 %] 96 % (04/26 0030) Weight:  [253 lb 8.5 oz (115 kg)] 253 lb 8.5 oz (115 kg) (04/25 2219)  PHYSICAL EXAMINATION: General:  Morbidly obese, older female Neuro:  Awake, Alert, oriented , no focal deficits HEENT:  AT,[place],PERRLA, No JVD Cardiovascular:  S1S2,Regular, no m/r/g Lungs:  Symmetrical chest compression, Diminished left lower,no wheezes,crackles and rhonchi noted Abdomen:  positive right upper quadrant tenderness to palpation, soft,ND Musculoskeletal:  No edema,cyanosis, active ROM Skin:  Warm,dry and Intact   Recent Labs Lab 06/20/16 06/21/16 06/22/16 2225  NA 133* 135* 124*  K 3.8 3.4 3.0*  CL  --   --  78*  CO2  --   --  34*  BUN 19 23* 33*  CREATININE 0.9 0.9  1.66*  GLUCOSE  --   --  389*    Recent Labs Lab 06/21/16 06/22/16 2225  HGB 10.2* 10.3*  HCT 32* 30.8*  WBC 16.3 12.9*  PLT 257 249   Dg Chest Portable 1 View  Result Date: 06/22/2016 CLINICAL DATA:  Acute onset of generalized weakness. Low blood pressure. Initial encounter. EXAM: PORTABLE CHEST 1 VIEW COMPARISON:  Chest radiograph performed 05/30/2016 FINDINGS: The lungs are mildly hypoexpanded. A small left pleural effusion is noted. Left basilar airspace opacity raises concern for pneumonia. Mild right basilar atelectasis is seen. No pneumothorax is seen. The cardiomediastinal silhouette is within normal limits. No acute osseous abnormalities are seen. IMPRESSION: Lungs mildly hypoexpanded. Small left pleural effusion noted. Left basilar airspace opacity raises concern for pneumonia. Mild right basilar atelectasis seen. Followup PA and lateral chest X-ray is recommended in 3-4 weeks following trial of antibiotic therapy to ensure resolution and exclude underlying malignancy. Electronically Signed   By: Garald Balding M.D.   On: 06/22/2016 23:06    ASSESSMENT / PLAN:  Left lower lobe PNA with sepsis, lactic acidosis.  Hypotension Small left pleural effusion Right Basilar atelectasis Elevated bilirubin with right upper quadrant abdominal pain Acute Kidney Injury  Hyponatremia Hypokalemia Mildly elevated troponins secondary to demand Ischemia Diabetes Melitus Lactic acidosis IgA bullous dermatitis  Hx of Hyperlipidemia/Hypertension  Continue vanc/ cefepime for HCAP Will bolus 500 ccX1 Gentle hydration with N/S with KCL Will order  stress dose steroids for bullous dermatitis  Follow cultures F/u RUQ ultrasound Accu checks  With SSI coverage Follow CBC,BMET Trend troponin Continue pravachol Will hold Ramipril,HCTZ    Bincy Varughese,AG-ACNP Pulmonary and [place]   06/23/2016, 1:21 AM   Patient seen and examined with NP, above note  as amended reflects my findings, assessment, plan. The patient is a 71 year old female, she was admitted to the hospital earlier this month with weakness, she was subsequently discharged to a rehabilitation facility. She presents back with dyspnea, and continued weakness. She was admitted to the ICU with the pneumonia with sepsis . Initial lactic acid was 3.6, this came down to 1.7 IV fluids, procalcitonin remained below 1. Creatinine was 1.66 at the time of admission, subsequent decreased to 1.3 On my personal review of the patient's imaging, there is a possible infiltrate.  The patient's bilirubin, AST and ALT are also slightly elevated . Will decrease IV fluids, patient appears to have been adequately resuscitated. Check a right upper quadrant ultrasound.  Marda Stalker, M.D.  06/23/2016

## 2016-06-23 NOTE — Procedures (Deleted)
PROCEDURE NOTE: Right THORACENTESIS    Date: 06/23/2016,  DOD:[id number] MRN# [id number] [name]   Indication: Pleural effusion/diagnsyic/therapeutic/ Empyema  A time-out was completed verifying correct patient, procedure, site, positioning, and implant(s) or special equipment if applicable.  Ultrasound guidance was used and appropriate fluid pocket was identified and marked. The posterior chest wall was scanned from base to apex in the mid scapular line, a free flowing slightly loculated pleural effusion was seen in the right pleural space, extending approximately half way up the posterior hemithorax. An area in approximately the ninth interspace in the medial scapular line was chosen for thoracentesis in March.   Patient was positioned, prepped and draped in usual sterile fashion. 1% Lidocaine was used to anesthetize the area.  The thora/para kit was used ; needle was introduced into the pleural space and fluid was removed. Blood loss was minimal Approximately 1 L of serous straw-colored fluid was removed from the right pleural space.  A chest xray was ordered to evaluate for pneumothorax.    Patient tolerated the procedure well, there were no complications.  Marda Stalker M.D. Critical Care Medicine

## 2016-06-23 NOTE — Progress Notes (Signed)
Pt has remained alert and oriented with no c/o pain. NSR on cardiac monitor. Lung sounds have remained clear to auscultation. SpO2 < 90%, O2 requirements have increased to 4LNC. Neo weaned off this am, restarted this afternoon, and then again turned off. BP seems labile. MAP parameters > 60. Son has been updated at bedside, POA placed in chart. RUQ US performed today-elevated bilirubin-calcified stone in GB neck.

## 2016-06-23 NOTE — H&P (Signed)
History and Physical   [facility] - [place] @ [facility] Admission History and Physical [employer], D.O.    Patient Name: [name] MR#: [id number] Date of Birth: 1945/10/22 Date of Admission: 06/22/2016  Referring MD/NP/PA: Dr. Mariea Clonts Primary Care Physician: Mable Paris, FNP Patient coming from: Home  Chief Complaint:  Chief Complaint  Patient presents with  . Weakness    HPI: [name] is a 71 y.o. female with a known history of Anemia, asthma, CHF, CVA, diabetes, GERD, hypertension, peripheral vascular disease presents to the emergency department for evaluation of weakness.  Patient was hospitalized in this facility for acute on chronic CHF exacerbation, acute respiratory failure with hypoxia and urinary tract infection. She was discharged to rehabilitation about 2 weeks ago where she has been doing well. She was discharged home however her caregiver states that upon arrival home she was very weak, lethargic complained of generalized abdominal pain, vomited once and became profoundly weak. On arrival to the emergency department she was significantly hypotensive with a blood pressure 62/28 and hypoxic with an O2 sat of 86% on room air. She has recently been on antibiotics for a presumed urinary tract infection and plan to have blood work repeated tomorrow. Caregiver states she has been eating and drinking normally and had been doing well in rehabilitation prior to this episode today.  Patient denies fevers/chills, weakness, dizziness, chest pain, shortness of breath, N/V/C/D, abdominal pain, dysuria/frequency, changes in mental status.    EMS/ED Course: Patient was found to meet sepsis criteria and received vancomycin, Zosyn, 1500 cc of normal saline.mentation improved significantly. At present she is feeling better with no specific complaints.   Review of Systems:  CONSTITUTIONAL: No fever/chills, fatigue, weakness, weight gain/loss, headache. positive weakness   EYES: No blurry or double vision. ENT: No tinnitus, postnasal drip, redness or soreness of the oropharynx. RESPIRATORY: No cough, dyspnea, wheeze.  No hemoptysis.  CARDIOVASCULAR: No chest pain, palpitations, syncope, orthopnea. No lower extremity edema.  GASTRO: Positive nausea, vomiting, abdominal pain, Negative diarrhea, constipation.  No hematemesis, melena or hematochezia. GENITOURINARY: No dysuria, frequency, hematuria. ENDOCRINE: No polyuria or nocturia. No heat or cold intolerance. HEMATOLOGY: No anemia, bruising, bleeding. INTEGUMENTARY: No rashes, ulcers, lesions. MUSCULOSKELETAL: No arthritis, gout, dyspnea. NEUROLOGIC: No numbness, tingling, ataxia, seizure-type activity, weakness. PSYCHIATRIC: No anxiety, depression, insomnia.   Past Medical History:  Diagnosis Date  . Anemia   . Asthma   . CHF (congestive heart failure) ([place])   . CVA (cerebral vascular accident) ([place])   . Diabetes mellitus without complication ([place])   . GERD (gastroesophageal reflux disease)   . Heart murmur   . History of hiatal hernia   . Hypertension   . Panic attacks   . Peripheral vascular disease ([place])   . Restless leg syndrome   . Shortness of breath dyspnea     Past Surgical History:  Procedure Laterality Date  . ABDOMINAL HYSTERECTOMY    . BREAST BIOPSY Right yrs ago   benign  . ENDARTERECTOMY Right 07/24/2014   Procedure: ENDARTERECTOMY CAROTID;  Surgeon: Algernon Huxley, MD;  Location: [facility] ORS;  Service: Vascular;  Laterality: Right;  . EYE SURGERY Left    cataract  . FRACTURE SURGERY Left    fractured ankle  . TONSILLECTOMY       reports that she has never smoked. She has never used smokeless tobacco. She reports that she does not drink alcohol or use drugs.  Allergies  Allergen Reactions  . Codeine Other (See Comments)  Reaction:  Dizziness   . Liraglutide     Other reaction(s): nausea and stomach pain    Family History  Problem Relation Age of Onset  . Pancreatic  cancer Mother   . CAD Father   . Hypertension Father   . Pancreatic cancer Father   . Colon cancer Father   . CAD Brother   . Heart disease Brother     Heart attack  . Breast cancer Maternal Aunt     pt states several maternal aunts    Prior to Admission medications   Medication Sig Start Date End Date Taking? Authorizing Provider  acetaminophen (TYLENOL) 325 MG tablet Take 2 tablets (650 mg total) by mouth every 6 (six) hours as needed for mild pain (or Fever >/= 101). 12/14/15  Yes Nicholes Mango, MD  albuterol (PROVENTIL) (2.5 MG/3ML) 0.083% nebulizer solution Take 3 mLs (2.5 mg total) by nebulization every 4 (four) hours as needed for wheezing. 12/14/15  Yes Nicholes Mango, MD  Amino Acids-Protein Hydrolys (FEEDING SUPPLEMENT, PRO-STAT SUGAR FREE 64,) LIQD Take 30 mLs by mouth. 30 ml daily for 30 days 06/16/16  Yes Historical Provider, MD  aspirin EC 81 MG tablet Take 81 mg by mouth daily.   Yes Historical Provider, MD  Cholecalciferol 1000 units capsule Take 1,000 Units by mouth daily.   Yes Historical Provider, MD  Cyanocobalamin (B-12) 1000 MCG/ML KIT Inject 1,000 mcg as directed every 30 (thirty) days. 02/02/16  Yes Historical Provider, MD  dapsone 100 MG tablet Take 150 mg by mouth daily.    Yes Historical Provider, MD  ferrous sulfate 325 (65 FE) MG tablet Take 1 tablet (325 mg total) by mouth daily with breakfast. 11/03/15  Yes Burnard Hawthorne, FNP  folic acid (FOLVITE) 1 MG tablet Take 1 mg by mouth daily. Take one pill daily on Tuesday, Wednesday, Thursday, Friday, Saturday and Sunday. These are days were patient isn't taking methotrexate   Yes Historical Provider, MD  gabapentin (NEURONTIN) 800 MG tablet Take 800 mg by mouth 2 (two) times daily.   Yes Historical Provider, MD  hydrochlorothiazide (HYDRODIURIL) 12.5 MG tablet Take 12.5 mg by mouth 2 (two) times daily.    Yes Historical Provider, MD  insulin aspart (NOVOLOG) 100 UNIT/ML injection Inject 10 Units into the skin 3 (three)  times daily before meals. 301-350+ 10 units, 351 or higher NOTIFY MD   Yes Historical Provider, MD  insulin glargine (LANTUS) 100 UNIT/ML injection Inject 0.2 mLs (20 Units total) into the skin at bedtime. 06/02/16  Yes Demetrios Loll, MD  lovastatin (MEVACOR) 20 MG tablet Take 20 mg by mouth at bedtime.   Yes Historical Provider, MD  methotrexate (RHEUMATREX) 2.5 MG tablet Take 10 mg by mouth once a week. Patient takes on Monday. Caution:Chemotherapy. Protect from light.   Yes Historical Provider, MD  Multiple Vitamins-Minerals (MULTIVITAL) tablet Take 1 tablet by mouth daily.   Yes Historical Provider, MD  omeprazole (PRILOSEC) 20 MG capsule Take 1 capsule (20 mg total) by mouth at bedtime. 03/09/16  Yes Burnard Hawthorne, FNP  potassium chloride SA (K-DUR,KLOR-CON) 20 MEQ tablet Take 2 tablets (40 mEq total) by mouth 2 (two) times daily. 06/02/16  Yes Demetrios Loll, MD  predniSONE (DELTASONE) 10 MG tablet Take 10 mg by mouth daily with breakfast. Until seen by dermatologist   Yes Historical Provider, MD  ramipril (ALTACE) 2.5 MG capsule Take 1 capsule (2.5 mg total) by mouth daily. 06/03/16  Yes Demetrios Loll, MD  torsemide (DEMADEX) 20 MG  tablet Take 2 tablets (40 mg total) by mouth 2 (two) times daily. 05/30/16 06/29/16 Yes Alisa Graff, FNP  Wound Dressings (SILVASORB) GEL Apply 1 application topically 2 (two) times daily as needed. 04/25/16  Yes Burnard Hawthorne, FNP    Physical Exam: Vitals:   06/22/16 2240 06/22/16 2300 06/22/16 2330 06/23/16 0030  BP: (!) 85/44 (!) 99/45 102/61 (!) 96/53  Pulse: 70 70 71 71  Resp: _0 Temp:      TempSrc:      SpO2: 96% 99% 97% 96%  Weight:      Height:        GENERAL: 71 y.o.-year-old White female patient, chronically ill-appearing lying in the bed in no acute distress.  Pleasant and cooperative.   HEENT: Head atraumatic, normocephalic. Pupils equal, round, reactive to light and accommodation. No scleral icterus. Extraocular muscles intact. Nares are patent.  Oropharynx is clear. Mucus membranes dry NECK: Supple, full range of motion. No JVD, no bruit heard. No thyroid enlargement, no tenderness, no cervical lymphadenopathy. CHEST: Normal breath sounds bilaterally. No wheezing, rales, rhonchi or crackles. No use of accessory muscles of respiration.  No reproducible chest wall tenderness.  CARDIOVASCULAR: S1, S2 normal. No murmurs, rubs, or gallops. Cap refill <2 seconds. Pulses intact distally.  ABDOMEN: Soft, nondistended, positive right upper quadrant tenderness to palpation.  No rebound, guarding, rigidity. Normoactive bowel sounds present in all four quadrants. No organomegaly or mass. EXTREMITIES: No pedal edema, cyanosis, or clubbing. No calf tenderness or Homan's sign.  NEUROLOGIC: The patient is alert and oriented x 3. Cranial nerves II through XII are grossly intact with no focal sensorimotor deficit. Muscle strength 5/5 in all extremities. Sensation intact. Gait not checked. PSYCHIATRIC:  Normal affect, mood, thought content. SKIN: Warm, dry, and intact without obvious rash, lesion, or ulcer.    Labs on Admission:  CBC:  Recent Labs Lab 06/21/16 06/22/16 2225  WBC 16.3 12.9*  HGB 10.2* 10.3*  HCT 32* 30.8*  MCV  --  92.6  PLT 257 784   Basic Metabolic Panel:  Recent Labs Lab 06/20/16 06/21/16 06/22/16 2225  NA 133* 135* 124*  K 3.8 3.4 3.0*  CL  --   --  78*  CO2  --   --  34*  GLUCOSE  --   --  389*  BUN 19 23* 33*  CREATININE 0.9 0.9 1.66*  CALCIUM  --   --  9.1   GFR: Estimated Creatinine Clearance: 37.9 mL/min (A) (by C-G formula based on SCr of 1.66 mg/dL (H)). Liver Function Tests:  Recent Labs Lab 06/21/16 06/22/16 2225  AST 32 60*  ALT 29 40  ALKPHOS 152* 152*  BILITOT  --  2.1*  PROT  --  6.3*  ALBUMIN  --  3.1*   No results for input(s): LIPASE, AMYLASE in the last 168 hours.  Recent Labs Lab 06/22/16 2225  AMMONIA 19   Coagulation Profile: No results for input(s): INR, PROTIME in the last  168 hours. Cardiac Enzymes:  Recent Labs Lab 06/22/16 2225  TROPONINI 0.03*   BNP (last 3 results) No results for input(s): PROBNP in the last 8760 hours. HbA1C: No results for input(s): HGBA1C in the last 72 hours. CBG:  Recent Labs Lab 06/23/16 0101  GLUCAP 361*   Lipid Profile: No results for input(s): CHOL, HDL, LDLCALC, TRIG, CHOLHDL, LDLDIRECT in the last 72 hours. Thyroid Function Tests: No results for input(s): TSH, T4TOTAL, FREET4, T3FREE, THYROIDAB in the last  72 hours. Anemia Panel: No results for input(s): VITAMINB12, FOLATE, FERRITIN, TIBC, IRON, RETICCTPCT in the last 72 hours. Urine analysis:    Component Value Date/Time   COLORURINE AMBER (A) 06/22/2016 2224   APPEARANCEUR HAZY (A) 06/22/2016 2224   LABSPEC 1.013 06/22/2016 2224   PHURINE 6.0 06/22/2016 2224   GLUCOSEU 50 (A) 06/22/2016 2224   HGBUR NEGATIVE 06/22/2016 2224   BILIRUBINUR NEGATIVE 06/22/2016 2224   BILIRUBINUR negative 04/25/2016 1355   KETONESUR NEGATIVE 06/22/2016 2224   PROTEINUR 30 (A) 06/22/2016 2224   UROBILINOGEN 0.2 04/25/2016 1355   NITRITE NEGATIVE 06/22/2016 2224   LEUKOCYTESUR TRACE (A) 06/22/2016 2224   Sepsis Labs: _0 (procalcitonin:4,lacticidven:4) )No results found for this or any previous visit (from the past 240 hour(s)).   Radiological Exams on Admission: Dg Chest Portable 1 View  Result Date: 06/22/2016 CLINICAL DATA:  Acute onset of generalized weakness. Low blood pressure. Initial encounter. EXAM: PORTABLE CHEST 1 VIEW COMPARISON:  Chest radiograph performed 05/30/2016 FINDINGS: The lungs are mildly hypoexpanded. A small left pleural effusion is noted. Left basilar airspace opacity raises concern for pneumonia. Mild right basilar atelectasis is seen. No pneumothorax is seen. The cardiomediastinal silhouette is within normal limits. No acute osseous abnormalities are seen. IMPRESSION: Lungs mildly hypoexpanded. Small left pleural effusion noted. Left basilar  airspace opacity raises concern for pneumonia. Mild right basilar atelectasis seen. Followup PA and lateral chest X-ray is recommended in 3-4 weeks following trial of antibiotic therapy to ensure resolution and exclude underlying malignancy. Electronically Signed   By: Garald Balding M.D.   On: 06/22/2016 23:06    EKG: Normal sinus rhythm 73 bpm with left ward axis and nonspecific ST-T wave changes.   Assessment/Plan  This is a 71 y.o. female with a history of Anemia, asthma, CHF, CVA, diabetes, GERD, hypertension, peripheral vascular disease  now being admitted with:  #. Sepsis, likely multifactorial secondary to pneumonia, urinary tract infection and possibly intra-abdominal infection - Admit to inpatient with telemetry monitoring - IV antibiotics: Maxipime, Vanco - Gentle IV fluid hydration, may need pressors - Follow up blood,urine & sputum cultures - Repeat CBC in am.  - We'll check right upper quadrant abdominal ultrasound  #. Elevated bilirubin with right upper quadrant abdominal pain, history of gallstones -Check right upper quadrant ultrasound   #. AK I, likely secondary to dehydration possible overdiuresis -Patient received fluid boluses in the emergency department -We'll continue gentle IV fluid hydration -Hold Altase   #. Acute hypoxic respiratory failure, likely secondary to pneumonia. History of congestive heart failure -Continue O2, nebulizers, expectorants. -BiPAP as needed -Assess the need for home O2 prior to discharge -Resume torsemide when blood pressure is more stable   #. Electronically included hyponatremia and hypokalemia -Replete as able.  #. Elevated troponin, likely secondary to demand ischemia in the setting of sepsis and AK I  - monitor on telemetry and trend troponins   #. History oAnemia, chronic and stable  -Continue iron  #. H/o Diabetes - Accuchecks achs with RISS coverage - Heart healthy, carb controlled diet -Continue Lantus  #.  History of hyperlipidemia -Continue Mevacor  #. History of hypertension -Hold antihypertensives at this time   Admission status:  inpatient, stepdown Fluids:  normal saline Diet/Nutrition: Heart healthy, carb controlled  Consults called:  critical care   DVT Px: Lovenox, SCDs and early ambulation. Code Status: Full Code  Disposition Plan: to be determined in 1-2 days  All the records are reviewed and case discussed with ED provider. Management  plans discussed with the patient and/or family who express understanding and agree with plan of care.  [name] D.O. on 06/23/2016 at 1:14 AM Between 7am to 6pm - Pager - [phone] After 6pm go to [email or web address] - [employer] [facility] [place] [facility] Office [phone] CC: Primary care physician; Mable Paris, [place]   06/23/2016, 1:14 AM

## 2016-06-23 NOTE — Progress Notes (Signed)
Pharmacy Antibiotic Note  [name] is a 71 y.o. female admitted on 06/22/2016 with pneumonia.  Pharmacy has been consulted for vanc/cefepime dosing.  Plan: Patient received vanc 2g IV x 1 in the ED  Since patient appears to be in AKI will dose based off of random levels Will draw a vanc random 4/26 w/ am labs and redose when level < 20 mcg/mL Will initiate Cefepime 2g IV q12h   Height: 5\' 2"  (157.5 cm) Weight: 220 lb 7.4 oz (100 kg) IBW/kg (Calculated) : 50.1  Temp (24hrs), Avg:97.7 F (36.5 C), Min:97.7 F (36.5 C), Max:97.7 F (36.5 C)   Recent Labs Lab 06/20/16 06/21/16 06/22/16 2225  WBC  --  16.3 12.9*  CREATININE 0.9 0.9 1.66*  LATICACIDVEN  --   --  3.6*    Estimated Creatinine Clearance: 34.9 mL/min (A) (by C-G formula based on SCr of 1.66 mg/dL (H)).    Allergies  Allergen Reactions  . Codeine Other (See Comments)    Reaction:  Dizziness   . Liraglutide     Other reaction(s): nausea and stomach pain    Thank you for allowing pharmacy to be a part of this patient's care.  06/24/16, PharmD, BCPS Clinical Pharmacist 06/23/2016

## 2016-06-23 NOTE — Progress Notes (Signed)
Inpatient Diabetes Program Recommendations  AACE/ADA: New Consensus Statement on Inpatient Glycemic Control (2015)  Target Ranges:  Prepandial:   less than 140 mg/dL      Peak postprandial:   less than 180 mg/dL (1-2 hours)      Critically ill patients:  140 - 180 mg/dL   Lab Results  Component Value Date   GLUCAP 200 (H) 06/23/2016   HGBA1C 5.5 05/30/2016    Review of Glycemic Control    Results for [name], [name] (MRN [id number]) as of 06/23/2016 14:45  Ref. Range 06/23/2016 01:01 06/23/2016 03:55 06/23/2016 07:27 06/23/2016 11:26  Glucose-Capillary Latest Ref Range: 65 - 99 mg/dL 026 (H) 378 (H) 588 (H) 200 (H)   Diabetes history:Type 2 Outpatient Diabetes medications: Lantus 20 unit qhs, Novololog 10 units tid with meal if glucose > 300mg /dl  Current orders for Inpatient glycemic control: Lantus 20 units qhs, Novolog 0-20 units tid q4h  *steroids q6h  Inpatient Diabetes Program Recommendations: Consider increasing Lantus to 25 units qhs (0.25units/kg)  , RN, BA, Susette Racer, CDE Diabetes Coordinator Inpatient Diabetes Program  [phone] (Team Pager) [phone] [facility] Office) 06/23/2016 2:47 PM

## 2016-06-23 NOTE — Progress Notes (Signed)
Pharmacy Antibiotic Note  [name] is a 71 y.o. female admitted on 06/22/2016 with pneumonia.  Pharmacy has been consulted for vanc/cefepime dosing.  Plan: Patient received vanc 2g IV x 1 in the ED  Since patient appears to be in AKI will dose based off of random levels Will draw a vanc random 4/26 w/ am labs and redose when level < 20 mcg/mL Will initiate Cefepime 2g IV q12h   Random draw before am labs: 4/25 @ 0130 VR 52 then redrew @ 0545 VR 32. Patient specific Ke 0.121 Patient specific t1/2 6 hours  Time for patient to get down to level of 15 mcg/mL is 6 hours -- will redraw another random level @ 1200. If level < 20 mcg/mL will redose w/ 15 mg/kg (vanc 1.5g IV x 1) and recheck another random w/ am labs. Will set a maintenance regimen once renal function stabilizes.  Height: 5\' 2"  (157.5 cm) Weight: 220 lb 7.4 oz (100 kg) IBW/kg (Calculated) : 50.1  Temp (24hrs), Avg:97.7 F (36.5 C), Min:97.7 F (36.5 C), Max:97.7 F (36.5 C)   Last Labs    Recent Labs Lab 06/20/16 06/21/16 06/22/16 2225  WBC  --  16.3 12.9*  CREATININE 0.9 0.9 1.66*  LATICACIDVEN  --   --  3.6*      Estimated Creatinine Clearance: 34.9 mL/min (A) (by C-G formula based on SCr of 1.66 mg/dL (H)).         Allergies  Allergen Reactions  . Codeine Other (See Comments)    Reaction:  Dizziness   . Liraglutide     Other reaction(s): nausea and stomach pain    Thank you for allowing pharmacy to be a part of this patient's care.  06/24/16, PharmD, BCPS Clinical Pharmacist 06/23/2016

## 2016-06-23 NOTE — Progress Notes (Signed)
CRITICAL VALUE ALERT  Critical value received: [place] Vanc level  Date of notification: 06/23/16  Time of notification:  0373  Critical value read back:Yes.    Nurse who received alert:  Lasha Echeverria RN  MD notified (1st page)Bincy V NP  Time of first page:  0324  MD notified (2nd page):  Time of second page:  Responding MD:  Magda Paganini NP  Time MD responded:  [phone]   Pharmacy to adjust dsoe

## 2016-06-24 ENCOUNTER — Inpatient Hospital Stay: Payer: Medicare Other

## 2016-06-24 ENCOUNTER — Telehealth: Payer: Self-pay | Admitting: Family

## 2016-06-24 LAB — BASIC METABOLIC PANEL
ANION GAP: 7 (ref 5–15)
BUN: 29 mg/dL — ABNORMAL HIGH (ref 6–20)
CHLORIDE: 97 mmol/L — AB (ref 101–111)
CO2: 29 mmol/L (ref 22–32)
Calcium: 8.8 mg/dL — ABNORMAL LOW (ref 8.9–10.3)
Creatinine, Ser: 0.99 mg/dL (ref 0.44–1.00)
GFR calc Af Amer: >60 mL/min (ref >60)
GFR, EST NON AFRICAN AMERICAN: 56 mL/min — AB (ref >60)
GLUCOSE: 250 mg/dL — AB (ref 65–99)
POTASSIUM: 4.3 mmol/L (ref 3.5–5.1)
Sodium: 133 mmol/L — ABNORMAL LOW (ref 135–145)

## 2016-06-24 LAB — GLUCOSE, CAPILLARY
GLUCOSE-CAPILLARY: 193 mg/dL — AB (ref 65–99)
GLUCOSE-CAPILLARY: 198 mg/dL — AB (ref 65–99)
Glucose-Capillary: 182 mg/dL — ABNORMAL HIGH (ref 65–99)
Glucose-Capillary: 195 mg/dL — ABNORMAL HIGH (ref 65–99)
Glucose-Capillary: 214 mg/dL — ABNORMAL HIGH (ref 65–99)
Glucose-Capillary: 219 mg/dL — ABNORMAL HIGH (ref 65–99)
Glucose-Capillary: 271 mg/dL — ABNORMAL HIGH (ref 65–99)

## 2016-06-24 MED ORDER — CEPHALEXIN 500 MG PO CAPS
500.0000 mg | ORAL_CAPSULE | Freq: Two times a day (BID) | ORAL | Status: DC
  Administered 2016-06-24 – 2016-06-25 (×3): 500 mg via ORAL
  Filled 2016-06-24 (×3): qty 1

## 2016-06-24 MED ORDER — TORSEMIDE 20 MG PO TABS
20.0000 mg | ORAL_TABLET | Freq: Two times a day (BID) | ORAL | Status: DC
  Administered 2016-06-24 – 2016-06-25 (×2): 20 mg via ORAL
  Filled 2016-06-24 (×2): qty 1

## 2016-06-24 MED ORDER — MAGNESIUM OXIDE 400 (241.3 MG) MG PO TABS
400.0000 mg | ORAL_TABLET | Freq: Two times a day (BID) | ORAL | Status: DC
  Administered 2016-06-24 – 2016-06-25 (×3): 400 mg via ORAL
  Filled 2016-06-24 (×3): qty 1

## 2016-06-24 MED ORDER — POTASSIUM CHLORIDE CRYS ER 20 MEQ PO TBCR
20.0000 meq | EXTENDED_RELEASE_TABLET | Freq: Two times a day (BID) | ORAL | Status: DC
  Administered 2016-06-25: 20 meq via ORAL
  Filled 2016-06-24: qty 1

## 2016-06-24 MED ORDER — INSULIN GLARGINE 100 UNIT/ML ~~LOC~~ SOLN
25.0000 [IU] | Freq: Every day | SUBCUTANEOUS | Status: DC
  Administered 2016-06-24: 25 [IU] via SUBCUTANEOUS
  Filled 2016-06-24 (×2): qty 0.25

## 2016-06-24 NOTE — Telephone Encounter (Signed)
Informed [name] that that was ok.

## 2016-06-24 NOTE — Care Management Note (Addendum)
Case Management Note  Patient Details  Name: [name] MRN: [id number] Date of Birth: 1945/11/15  Subjective/Objective:  Met with patient to discuss PT recommendations. She lives at home with 24 hour care givers. He son lives down the street. She is open to home PT and RN. Offered choice of home health agencies. [facility] to [facility]. Requests a hospital bed but needs a prescription because she wants to get it herself. PCP is Dr. Oliver Barre with [facility].  She was last seen approximately 2 months ago.                   Action/Plan: [facility] for SN and PT.    Expected Discharge Date:                  Expected Discharge Plan:  [place]  In-House Referral:     Discharge planning Services  CM Consult  Post Acute Care Choice:  Durable Medical Equipment, Home Health Choice offered to:  Patient  DME Arranged:  Hospital bed DME Agency:  [place]:  PT [place]:  [place]  Status of Service:  In process, will continue to follow  If discussed at Long Length of Stay Meetings, dates discussed:    Additional Comments:  Jolly Mango, RN 06/24/2016, 4:04 PM

## 2016-06-24 NOTE — Progress Notes (Signed)
Pharmacy Antibiotic Note  [name] is a 71 y.o. female admitted on 06/22/2016 with pneumonia.  Pharmacy has been consulted for vanc/cefepime dosing.  4/26 Vancomycin  discontinued   Plan: Will continue cefepime 2gm IV every 24 hours based on current CrCl 30-35ml/min   Height: 5\' 2"  (157.5 cm) Weight: 220 lb 7.4 oz (100 kg) IBW/kg (Calculated) : 50.1  Temp (24hrs), Avg:97.7 F (36.5 C), Min:97.7 F (36.5 C), Max:97.7 F (36.5 C)   Last Labs    Recent Labs Lab 06/20/16 06/21/16 06/22/16 2225  WBC  --  16.3 12.9*  CREATININE 0.9 0.9 1.66*  LATICACIDVEN  --   --  3.6*      Estimated Creatinine Clearance: 34.9 mL/min (A) (by C-G formula based on SCr of 1.66 mg/dL (H)).         Allergies  Allergen Reactions  . Codeine Other (See Comments)    Reaction:  Dizziness   . Liraglutide     Other reaction(s): nausea and stomach pain    Thank you for allowing pharmacy to be a part of this patient's care.  06/24/16, PharmD, BCPS Clinical Pharmacist 06/24/2016 11:05 AM

## 2016-06-24 NOTE — Progress Notes (Addendum)
Potassium held this am until recheck is resulted. Per Annabelle Harman, NP stop ivf. Report called to Raynelle Fanning, Charity fundraiser. Patient to transfer shortly. [place] at 4L, decreased to 3L. Patient tolerating well so far, no complaints. VSS. Elink and CCMD notified of transfer. Trudee Kuster  1129: Dr. Allena Katz notified of potassium level and possible need for adjustment. Also notified of diabetes coordinator's recommendations for SSI now that patient is eating. Patient now in room 142, family updated on location of patient.

## 2016-06-24 NOTE — Telephone Encounter (Signed)
Keesa from [facility] called and stated that they received a referral on this patient yesterday 4/26. They were unable to make it out to the patient today. They can see the pt on Tuesday. Is this ok? Please advise, thank you!  Call [facility] @ [phone]

## 2016-06-24 NOTE — Progress Notes (Signed)
Clinical Social Worker (CSW) received a call from Chris [employer] case manager [phone] Ext. [phone]. Per Chris patient is close to her baseline and can probably go home with 24/7 caregivers. Per Chris he has to send case to the medical director to review and requested a peer to peer. MD is agreeable to complete a peer to peer. CSW gave Chris MD pager number. CSW met with patient and made her aware of above. Patient reported that she can't pay out of pocket for SNF so if [employer] denies SNF then she is agreeable to discharge home and resume her 24/7 caregivers and have home health PT. CSW will continue to follow and assist as needed.    , LCSW [phone] 

## 2016-06-24 NOTE — [place] FL2 (Signed)
[place] MEDICAID FL2 LEVEL OF CARE SCREENING TOOL     IDENTIFICATION  Patient Name: [name] Birthdate: 11-27-1945 Sex: female Admission Date (Current Location): 06/22/2016  [place] and [place] Number:  Chiropodist and Address:  [facility], [address], [place], [place] [address]      Provider Number: [id number]  Attending Physician Name and Address:  Enedina Finner, MD  Relative Name and Phone Number:       Current Level of Care: Hospital Recommended Level of Care: Skilled Nursing Facility Prior Approval Number:    Date Approved/Denied:   PASRR Number:  ([id number] )  Discharge Plan: SNF    Current Diagnoses: Patient Active Problem List   Diagnosis Date Noted  . Sepsis (HCC) 06/22/2016  . Pain of upper abdomen 03/10/2016  . Morbid obesity (HCC) 02/02/2016  . HTN (hypertension) 01/06/2016  . Routine physical examination 12/30/2015  . Recurrent falls 12/13/2015  . Fatty liver 12/10/2015  . Fracture of multiple ribs 11/25/2015  . B12 deficiency anemia 11/03/2015  . GERD (gastroesophageal reflux disease) 10/17/2015  . Pure hypercholesterolemia 10/17/2015  . Uncontrolled type 2 diabetes mellitus with diabetic neuropathy, with long-term current use of insulin (HCC) 10/05/2015  . Depression 10/05/2015  . 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 CHF (congestive heart failure) (HCC) 03/13/2015  . Carotid stenosis 07/24/2014    Orientation RESPIRATION BLADDER Height & Weight     Self, Time, Situation, Place  O2 (4 Liters Oxygen ) Incontinent Weight: 220 lb 7.4 oz (100 kg) Height:  5\' 2"  (157.5 cm)  BEHAVIORAL SYMPTOMS/MOOD NEUROLOGICAL BOWEL NUTRITION STATUS   (none)  (none) Continent Diet (Diet: Heart Healthy/ Carb Modified )  AMBULATORY STATUS COMMUNICATION OF NEEDS Skin   Extensive Assist Verbally Other (Comment) (open area on groin left. )                       Personal Care Assistance Level of Assistance   Bathing, Feeding, Dressing Bathing Assistance: Limited assistance Feeding assistance: Independent Dressing Assistance: Limited assistance     Functional Limitations Info  Sight, Hearing, Speech Sight Info: Adequate Hearing Info: Adequate Speech Info: Adequate    SPECIAL CARE FACTORS FREQUENCY  PT (By licensed PT), OT (By licensed OT)     PT Frequency:  (5) OT Frequency:  (5)            Contractures      Additional Factors Info  Code Status, Allergies, Insulin Sliding Scale Code Status Info:  (Full Code. ) Allergies Info:  (Codeine, Liraglutide)   Insulin Sliding Scale Info:  (NovoLog Insulin Injections. )       Current Medications (06/24/2016):  This is the current hospital active medication list Current Facility-Administered Medications  Medication Dose Route Frequency Provider Last Rate Last Dose  . acetaminophen (TYLENOL) tablet 650 mg  650 mg Oral Q6H PRN Alexis Hugelmeyer, DO       Or  . acetaminophen (TYLENOL) suppository 650 mg  650 mg Rectal Q6H PRN Alexis Hugelmeyer, DO      . albuterol (PROVENTIL) (2.5 MG/3ML) 0.083% nebulizer solution 2.5 mg  2.5 mg Nebulization Q6H PRN Alexis Hugelmeyer, DO      . aspirin EC tablet 81 mg  81 mg Oral Daily Alexis Hugelmeyer, DO   81 mg at 06/24/16 0944  . bisacodyl (DULCOLAX) EC tablet 5 mg  5 mg Oral Daily PRN Alexis Hugelmeyer, DO      . ceFEPIme (MAXIPIME) 2 g  in dextrose 5 % 50 mL IVPB  2 g Intravenous Q24H Enedina Finner, MD      . chlorhexidine (PERIDEX) 0.12 % solution 15 mL  15 mL Mouth Rinse BID Shane Crutch, MD   15 mL at 06/23/16 1959  . cholecalciferol (VITAMIN D) tablet 1,000 Units  1,000 Units Oral Daily Alexis Hugelmeyer, DO   1,000 Units at 06/24/16 0944  . enoxaparin (LOVENOX) injection 30 mg  30 mg Subcutaneous BID Alexis Hugelmeyer, DO   30 mg at 06/24/16 0943  . feeding supplement (PRO-STAT SUGAR FREE 64) liquid 30 mL  30 mL Oral Daily Alexis Hugelmeyer, DO   30 mL at 06/24/16 0943  . ferrous sulfate  tablet 325 mg  325 mg Oral Q breakfast Alexis Hugelmeyer, DO   325 mg at 06/24/16 0844  . folic acid (FOLVITE) tablet 1 mg  1 mg Oral Daily Alexis Hugelmeyer, DO   1 mg at 06/24/16 0943  . gabapentin (NEURONTIN) capsule 800 mg  800 mg Oral BID Alexis Hugelmeyer, DO   800 mg at 06/24/16 0943  . insulin aspart (novoLOG) injection 0-20 Units  0-20 Units Subcutaneous Q4H Alexis Hugelmeyer, DO   4 Units at 06/24/16 0844  . insulin glargine (LANTUS) injection 20 Units  20 Units Subcutaneous QHS Alexis Hugelmeyer, DO   20 Units at 06/23/16 1958  . ipratropium (ATROVENT) nebulizer solution 0.5 mg  0.5 mg Nebulization Q6H PRN Alexis Hugelmeyer, DO      . magnesium citrate solution 1 Bottle  1 Bottle Oral Once PRN Alexis Hugelmeyer, DO      . MEDLINE mouth rinse  15 mL Mouth Rinse q12n4p Shane Crutch, MD   15 mL at 06/23/16 1133  . multivitamin with minerals tablet 1 tablet  1 tablet Oral Daily Alexis Hugelmeyer, DO   1 tablet at 06/24/16 0943  . ondansetron (ZOFRAN) tablet 4 mg  4 mg Oral Q6H PRN Alexis Hugelmeyer, DO       Or  . ondansetron (ZOFRAN) injection 4 mg  4 mg Intravenous Q6H PRN Alexis Hugelmeyer, DO      . pantoprazole (PROTONIX) EC tablet 40 mg  40 mg Oral Daily Alexis Hugelmeyer, DO   40 mg at 06/24/16 0943  . potassium chloride SA (K-DUR,KLOR-CON) CR tablet 40 mEq  40 mEq Oral BID Alexis Hugelmeyer, DO   40 mEq at 06/23/16 2001  . pravastatin (PRAVACHOL) tablet 20 mg  20 mg Oral q1800 Alexis Hugelmeyer, DO   20 mg at 06/23/16 2001  . senna-docusate (Senokot-S) tablet 1 tablet  1 tablet Oral QHS PRN Alexis Hugelmeyer, DO      . sodium chloride 0.9 % bolus 500 mL  500 mL Intravenous Once Rockne Menghini, MD   Stopped at 06/23/16 0122  . sodium chloride flush (NS) 0.9 % injection 3 mL  3 mL Intravenous Q12H Alexis Hugelmeyer, DO   3 mL at 06/24/16 1000     Discharge Medications: Please see discharge summary for a list of discharge medications.  Relevant Imaging  Results:  Relevant Lab Results:   Additional Information  (SSN: [id number])  Javayah Magaw, Darleen Crocker, LCSW

## 2016-06-24 NOTE — Progress Notes (Signed)
[facility]   PATIENT NAME: [name]    MR#:  [id number]  DATE OF BIRTH:  [birth year]  SUBJECTIVE:  Came in with increasing SOB and weakness. Found to have pnuemonia  feels weak Breathing better REVIEW OF SYSTEMS:   Review of Systems  Constitutional: Negative for chills, fever and weight loss.  HENT: Negative for ear discharge, ear pain and nosebleeds.   Eyes: Negative for blurred vision, pain and discharge.  Respiratory: Positive for shortness of breath. Negative for sputum production, wheezing and stridor.   Cardiovascular: Negative for chest pain, palpitations, orthopnea and PND.  Gastrointestinal: Negative for abdominal pain, diarrhea, nausea and vomiting.  Genitourinary: Negative for frequency and urgency.  Musculoskeletal: Negative for back pain and joint pain.  Neurological: Positive for weakness. Negative for sensory change, speech change and focal weakness.  Psychiatric/Behavioral: Negative for depression and hallucinations. The patient is not nervous/anxious.    Tolerating Diet:yes Tolerating PT: SNF  DRUG ALLERGIES:   Allergies  Allergen Reactions  . Codeine Other (See Comments)    Reaction:  Dizziness   . Liraglutide     Other reaction(s): nausea and stomach pain    VITALS:  Blood pressure 125/63, pulse 87, temperature 98 F (36.7 C), temperature source Oral, resp. rate 16, height 5\' 2"  (1.575 m), weight 100 kg (220 lb 7.4 oz), SpO2 95 %.  PHYSICAL EXAMINATION:   Physical Exam  GENERAL:  71 y.o.-year-old patient lying in the bed with no acute distress.obese  EYES: Pupils equal, round, reactive to light and accommodation. No scleral icterus. Extraocular muscles intact.  HEENT: Head atraumatic, normocephalic. Oropharynx and nasopharynx clear.  NECK:  Supple, no jugular venous distention. No thyroid enlargement, no tenderness.  LUNGS: decreased breath sounds bilaterally, no wheezing, rales, rhonchi. No use  of accessory muscles of respiration.  CARDIOVASCULAR: S1, S2 normal. No murmurs, rubs, or gallops.  ABDOMEN: Soft, nontender, nondistended. Bowel sounds present. No organomegaly or mass.  EXTREMITIES: No cyanosis, clubbing or edema b/l.    NEUROLOGIC: Cranial nerves II through XII are intact. No focal Motor or sensory deficits b/l.  deconditioned PSYCHIATRIC:  patient is alert and oriented x 3.  SKIN: No obvious rash, lesion, or ulcer.   LABORATORY PANEL:  CBC  Recent Labs Lab 06/23/16 0120  WBC 12.4*  HGB 9.9*  HCT 29.7*  PLT 204    Chemistries   Recent Labs Lab 06/22/16 2225 06/23/16 0120 06/24/16 1036  NA 124* 128* 133*  K 3.0* 3.6 4.3  CL 78* 86* 97*  CO2 34* 32 29  GLUCOSE 389* 378* 250*  BUN 33* 29* 29*  CREATININE 1.66* 1.30* 0.99  CALCIUM 9.1 8.3* 8.8*  MG  --  1.7  --   AST 60*  --   --   ALT 40  --   --   ALKPHOS 152*  --   --   BILITOT 2.1*  --   --    Cardiac Enzymes  Recent Labs Lab 06/23/16 1322  TROPONINI 0.03*   RADIOLOGY:  Dg Chest Portable 1 View  Result Date: 06/22/2016 CLINICAL DATA:  Acute onset of generalized weakness. Low blood pressure. Initial encounter. EXAM: PORTABLE CHEST 1 VIEW COMPARISON:  Chest radiograph performed 05/30/2016 FINDINGS: The lungs are mildly hypoexpanded. A small left pleural effusion is noted. Left basilar airspace opacity raises concern for pneumonia. Mild right basilar atelectasis is seen. No pneumothorax is seen. The cardiomediastinal silhouette is within normal limits. No acute osseous abnormalities are  seen. IMPRESSION: Lungs mildly hypoexpanded. Small left pleural effusion noted. Left basilar airspace opacity raises concern for pneumonia. Mild right basilar atelectasis seen. Followup PA and lateral chest X-ray is recommended in 3-4 weeks following trial of antibiotic therapy to ensure resolution and exclude underlying malignancy. Electronically Signed   By: Roanna Raider M.D.   On: 06/22/2016 23:06   US Abdomen  Limited Ruq  Result Date: 06/23/2016 CLINICAL DATA:  Abdominal pain EXAM: US ABDOMEN LIMITED - RIGHT UPPER QUADRANT COMPARISON:  CT 03/09/2016 FINDINGS: Gallbladder: Calcified gallstone at the neck. Although shadowing is difficult to detect, stone is calcified by CT. No wall thickening or focal tenderness. Common bile duct: Diameter: 5 mm.  Where visualized, no filling defect. Liver: Cirrhotic liver morphology. No mass lesion is seen. Antegrade flow in the imaged hepatic and portal venous system. Trace peritoneal fluid. IMPRESSION: 1. Cholelithiasis without evidence of cholecystitis. 2. Cirrhosis. Electronically Signed   By: Marnee Spring M.D.   On: 06/23/2016 10:03   ASSESSMENT AND PLAN:   71 y.o. female with a history of Anemia, asthma, CHF, CVA, diabetes, GERD, hypertension, peripheral vascular disease  now being admitted with:  #. Sepsis, likely multifactorial secondary to pneumonia,possible urinary tract infection a - IV antibiotics: Maxipime---change to oral abxs -recieved Gentle IV fluid hydration - Follow up blood culture- negative  -  right upper quadrant abdominal ultrasound shows calcified stone at GB neck  #. Elevated bilirubin with right upper quadrant abdominal pain, history of gallstones -ight upper quadrant ultrasound as above  #. AK I, likely secondary to dehydration possible overdiuresis -Patient received fluid boluses in the emergency department -We'll continue gentle IV fluid hydration -Hold Altace   #. Acute hypoxic respiratory failure, likely secondary to pneumonia. History of congestive heart failure -Continue O2, nebulizers, expectorants. -BiPAP as needed -Assess the need for home O2 prior to discharge -Resume torsemide when blood pressure is more stable  #.  hyponatremia and hypokalemia -Replete as able.  #. Elevated troponin, likely secondary to demand ischemia in the setting of sepsis and AK I  - monitor on telemetry and trend troponins   #.  History oAnemia, chronic and stable  -Continue iron  #. H/o Diabetes - Accuchecks achs with RISS coverage - Heart healthy, carb controlled diet -Continue Lantus  #. History of hyperlipidemia -Continue Mevacor  #. History of hypertension -resume antihypertensives at this time   PT recommends rehab   Case discussed with Care Management/Social Worker. Management plans discussed with the patient, family and they are in agreement.  CODE STATUS: full  DVT Prophylaxis: lovenox  TOTAL TIME TAKING CARE OF THIS PATIENT: 30 minutes.  >50% time spent on counselling and coordination of care  POSSIBLE D/C IN 1-2* DAYS, DEPENDING ON CLINICAL CONDITION.  Note: This dictation was prepared with Dragon dictation along with smaller phrase technology. Any transcriptional errors that result from this process are unintentional.  Akaylah Lalley M.D on 06/24/2016 at 1:41 PM  Between 7am to 6pm - Pager - [phone]  After 6pm go to [email or web address] - Social research officer, government  [facility]  Office  [phone]  CC: Primary care physician; Rennie Plowman, FNP

## 2016-06-24 NOTE — Clinical Social Work Placement (Signed)
   CLINICAL SOCIAL WORK PLACEMENT  NOTE  Date:  06/24/2016  Patient Details  Name: [name] MRN: [id number] Date of Birth: [birth year]  Clinical Social Work is seeking post-discharge placement for this patient at the Skilled  Nursing Facility level of care (*CSW will initial, date and re-position this form in  chart as items are completed):  Yes   Patient/family provided with [facility] Clinical Social Work Department's list of facilities offering this level of care within the geographic area requested by the patient (or if unable, by the patient's family).  Yes   Patient/family informed of their freedom to choose among providers that offer the needed level of care, that participate in Medicare, Medicaid or managed care program needed by the patient, have an available bed and are willing to accept the patient.  Yes   Patient/family informed of [facility]'s ownership interest in [facility] and [facility], as well as of the fact that they are under no obligation to receive care at these facilities.  PASRR submitted to EDS on       PASRR number received on       Existing PASRR number confirmed on 06/24/16     FL2 transmitted to all facilities in geographic area requested by pt/family on 06/24/16     FL2 transmitted to all facilities within larger geographic area on       Patient informed that his/her managed care company has contracts with or will negotiate with certain facilities, including the following:        Yes   Patient/family informed of bed offers received.  Patient chooses bed at  [facility] )     Physician recommends and patient chooses bed at      Patient to be transferred to   on  .  Patient to be transferred to facility by       Patient family notified on   of transfer.  Name of family member notified:        PHYSICIAN       Additional Comment:    _______________________________________________ Lyndsy Gilberto, Darleen Crocker, LCSW 06/24/2016, 11:34  AM

## 2016-06-24 NOTE — Progress Notes (Signed)
Pt left the unit this  Evening for chest x ray and returned. Caregiver at bedside, pt ate dinner while sitting on side of the bed.

## 2016-06-24 NOTE — Clinical Social Work Note (Signed)
Clinical Social Work Assessment  Patient Details  Name: [name] MRN: [id number] Date of Birth: 07-08-1945  Date of referral:  06/24/16               Reason for consult:  Facility Placement                Permission sought to share information with:  Chartered certified accountant granted to share information::  Yes, Verbal Permission Granted  Name::      Kendra::   [name]   Relationship::     Contact Information:     Housing/Transportation Living arrangements for the past 2 months:  [place], [place] of Information:  Patient, Adult Children, Power of Attorney Patient Interpreter Needed:  None Criminal Activity/Legal Involvement Pertinent to Current Situation/Hospitalization:  No - Comment as needed Significant Relationships:  Adult Children Lives with:  Self Do you feel safe going back to the place where you live?  Yes Need for family participation in patient care:  Yes (Comment)  Care giving concerns:  Patient lives alone in [place] and has 24/7 private duty caregivers.    Social Worker assessment / plan:  Holiday representative (CSW) reviewed chart and noted that PT is recommending SNF. CSW met with patient and her daughter/ HPOA [name] [phone] was at bedside. CSW introduced self and explained role of CSW department. Patient was alert and oriented X4. Per patient she lives alone in [place] and her son [name] lives within walking distance from her house. Patient's daughter [name] lives 45 minutes from patient in [place], [place]. Per patient she pays for 24/7 private duty caregivers at home. Patient reported that she recently spent 20 days at [facility] was discharged home and came back to [facility] 5 hours after being discharged from [facility]. CSW explained that to patient that PT is recommending SNF again. CSW explained that patient will have a 20% co-pay at SNF because she has  not had a 60 day wellness period. Per daughter they are aware co-pay will be around $170 per day. Patient and daughter requested for patient to go back to [facility]. FL2 complete and faxed out. CSW presented bed offers and patient chose [facility] CSW started [name] authorization.     Employment status:  Retired Nurse, adult PT Recommendations:  [facility] / Referral to community resources:  [place]  Patient/Family's Response to care:  Patient and daughter are agreeable to [facility] and prefer [facility]   Patient/Family's Understanding of and Emotional Response to Diagnosis, Current Treatment, and Prognosis:  Patient and daughter were very pleasant and thanked CSW for assistance.   Emotional Assessment Appearance:  Appears stated age Attitude/Demeanor/Rapport:    Affect (typically observed):  Accepting, Adaptable, Pleasant Orientation:  Oriented to Self, Oriented to Place, Oriented to  Time, Oriented to Situation Alcohol / Substance use:  Not Applicable Psych involvement (Current and /or in the community):  No (Comment)  Discharge Needs  Concerns to be addressed:  Discharge Planning Concerns Readmission within the last 30 days:  No Current discharge risk:  Dependent with Mobility Barriers to Discharge:  Continued Medical Work up   [employer], Kendra Beets, LCSW 06/24/2016, 11:35 AM

## 2016-06-24 NOTE — Progress Notes (Signed)
Per Domenick Gong Medicare case manager authorization for SNF has been denied after the peer to peer was complete. Patient will D/C home with home health. Patient is aware of above. MD, RN and RN case manager is aware of above. Please reconsult if future social work needs arise. CSW signing off.   [employer], LCSW [phone]

## 2016-06-24 NOTE — Progress Notes (Signed)
Pt arrived on the unit via bed from CCU at approx 1035. Pt accompanied by her daughter. Pt is alert and oriented x4, o2 on at [facility], pt denied pain, denied feeling sob, is able to speak in complete sentences. piv #20 intact to each arm, bilat forearms are nearly completely bruised. Lab personnel in to attempt bmp draw. This Clinical research associate confirmed ongoing md orders with pt and daughter, and pt eval discussed, including recommendation for snf on discharge. Discussed sepsis appears to be from possible pna, uti, and possible intra abdominal infection. Pt is aware that gallstone is present and lodged in duct. Pt is oriented to room and call bell, is aware of plan of care from this point forward.

## 2016-06-24 NOTE — Progress Notes (Signed)
Inpatient Diabetes Program Recommendations  AACE/ADA: New Consensus Statement on Inpatient Glycemic Control (2015)  Target Ranges:  Prepandial:   less than 140 mg/dL      Peak postprandial:   less than 180 mg/dL (1-2 hours)      Critically ill patients:  140 - 180 mg/dL   Lab Results  Component Value Date   GLUCAP 195 (H) 06/24/2016   HGBA1C 5.5 05/30/2016    Review of Glycemic Control  Results for [name], [name] (MRN [id number]) as of 06/24/2016 11:00  Ref. Range 06/23/2016 17:14 06/23/2016 19:34 06/23/2016 23:57 06/24/2016 03:50 06/24/2016 07:14  Glucose-Capillary Latest Ref Range: 65 - 99 mg/dL 263 (H) 335 (H) 456 (H) 219 (H) 195 (H)    Diabetes history:Type 2 Outpatient Diabetes medications: Lantus 20 unit qhs, Novololog 10 units tid with meal if glucose > 300mg /dl  Current orders for Inpatient glycemic control: Lantus 20units qhs, Novolog 0-20 units tid q4h  *steroids q6h  Inpatient Diabetes Program Recommendations: Now that the patient is ordered a diet, consider changing q4h Novolog to Novolog 0-20 units tid, and add Novolog 0-5 units qhs  Fasting blood sugar 195mg /dl- consider increasing Lantus to 25 units qhs.  , RN, BA, MHA, CDE Diabetes Coordinator Inpatient Diabetes Program  [phone] (Team Pager) [phone] [facility] Office) 06/24/2016 11:01 AM

## 2016-06-25 ENCOUNTER — Other Ambulatory Visit: Payer: Self-pay | Admitting: Family

## 2016-06-25 LAB — GLUCOSE, CAPILLARY
GLUCOSE-CAPILLARY: 163 mg/dL — AB (ref 65–99)
GLUCOSE-CAPILLARY: 116 mg/dL — AB (ref 65–99)

## 2016-06-25 MED ORDER — MAGNESIUM OXIDE 400 (241.3 MG) MG PO TABS: 400.0000 mg | ORAL_TABLET | Freq: Two times a day (BID) | ORAL | 0 refills | Status: AC

## 2016-06-25 MED ORDER — CEPHALEXIN 500 MG PO CAPS: 500.0000 mg | ORAL_CAPSULE | Freq: Two times a day (BID) | ORAL | 0 refills | Status: DC

## 2016-06-25 NOTE — Progress Notes (Signed)
PT Cancellation Note  Patient Details Name: [name] MRN: [id number] DOB: 1945-03-14   Cancelled Treatment:    Reason Eval/Treat Not Completed: Other (comment) (Patient being discharged at time treatment attempted.) PT entered room at 1106 with RN and NA present, completing paperwork. PT treatment cancelled.   Neita Carp, PT, DPT 06/25/2016, 11:06 AM

## 2016-06-25 NOTE — Discharge Summary (Signed)
[facility] NAME: [name]    MR#:  [id number]  DATE OF BIRTH:  1945-12-13  DATE OF ADMISSION:  06/22/2016 ADMITTING PHYSICIAN: Harvie Bridge, DO  DATE OF DISCHARGE: 06/25/2016  PRIMARY CARE PHYSICIAN: Mable Paris, FNP    ADMISSION DIAGNOSIS:  Hypokalemia [E87.6] Hyponatremia [E87.1] HCAP (healthcare-associated pneumonia) [J18.9] Acute respiratory failure with hypoxia and hypercarbia ([place]) [J96.01, J96.02]  DISCHARGE DIAGNOSIS:  Sepsis ruled out Acute hypoxic respiratoy failure-resolved arf-improved  SECONDARY DIAGNOSIS:   Past Medical History:  Diagnosis Date  . Anemia   . Asthma   . CHF (congestive heart failure) ([place])   . CVA (cerebral vascular accident) ([place])   . Diabetes mellitus without complication ([place])   . GERD (gastroesophageal reflux disease)   . Heart murmur   . History of hiatal hernia   . Hypertension   . Panic attacks   . Peripheral vascular disease ([place])   . Restless leg syndrome   . Shortness of breath dyspnea     HOSPITAL COURSE:   71 y.o.femalewith a history of Anemia, asthma, CHF, CVA, diabetes, GERD, hypertension, peripheral vascular diseasenow being admitted with:  #. Sepsis ruled out  -// secondary to pneumonia - IV antibiotics: Maxipime---change to oral abxs -recieved Gentle IV fluid hydration -blood culture- negative  -  right upper quadrant abdominal ultrasound shows calcified stone at GB neck  #. Elevated bilirubin with right upper quadrant abdominal pain, history of gallstones -ight upper quadrant ultrasound as above  #. AK I, likely secondary to dehydration possible overdiuresis - recieved IV fluid hydration -resumed Altace   #. Acute hypoxic respiratory failure, likely secondary to pneumonia. History of  chornic diastoliccongestive heart failure -Continue O2, nebulizers, expectorants. -BiPAP as needed -sats 925 on rA -Resume torsemide upon  d/c  #.  hyponatremia and hypokalemia -Replete as able.  #. Elevated troponin, likely secondary to demand ischemia in the setting of sepsis and AK I  - monitor on telemetry and trend troponins   #. History oAnemia, chronic and stable  -Continue iron  #. H/o Diabetes - Accuchecks achs with RISS coverage - Heart healthy, carb controlled diet -Continue Lantus  #. History of hyperlipidemia -Continue Mevacor  #. History of hypertension -resume antihypertensives at this time   PT recommends rehab but insurance denied it  D/[facility] home with [facility]   Case discussed with Care Management/Social Worker. Management plans discussed with the patient, family and they are in agreement.  CODE STATUS: full CONSULTS OBTAINED:    DRUG ALLERGIES:   Allergies  Allergen Reactions  . Codeine Other (See Comments)    Reaction:  Dizziness   . Liraglutide     Other reaction(s): nausea and stomach pain    DISCHARGE MEDICATIONS:   Current Discharge Medication List    START taking these medications   Details  cephALEXin (KEFLEX) 500 MG capsule Take 1 capsule (500 mg total) by mouth every 12 (twelve) hours. Qty: 8 capsule, Refills: 0    magnesium oxide (MAG-OX) 400 (241.3 Mg) MG tablet Take 1 tablet (400 mg total) by mouth 2 (two) times daily. Qty: 14 tablet, Refills: 0      CONTINUE these medications which have NOT CHANGED   Details  acetaminophen (TYLENOL) 325 MG tablet Take 2 tablets (650 mg total) by mouth every 6 (six) hours as needed for mild pain (or Fever >/= 101).    albuterol (PROVENTIL) (2.5 MG/3ML) 0.083% nebulizer solution Take 3 mLs (2.5 mg total) by nebulization  every 4 (four) hours as needed for wheezing. Qty: 75 mL, Refills: 12    Amino Acids-Protein Hydrolys (FEEDING SUPPLEMENT, PRO-STAT SUGAR FREE 64,) LIQD Take 30 mLs by mouth. 30 ml daily for 30 days    aspirin EC 81 MG tablet Take 81 mg by mouth daily.    Cholecalciferol 1000 units capsule Take 1,000 Units  by mouth daily.    Cyanocobalamin (B-12) 1000 MCG/ML KIT Inject 1,000 mcg as directed every 30 (thirty) days.    dapsone 100 MG tablet Take 150 mg by mouth daily.     ferrous sulfate 325 (65 FE) MG tablet Take 1 tablet (325 mg total) by mouth daily with breakfast. Qty: 30 tablet, Refills: 3   Associated Diagnoses: Anemia, unspecified anemia type    folic acid (FOLVITE) 1 MG tablet Take 1 mg by mouth daily. Take one pill daily on Tuesday, Wednesday, Thursday, Friday, Saturday and Sunday. These are days were patient isn't taking methotrexate    gabapentin (NEURONTIN) 800 MG tablet Take 800 mg by mouth 2 (two) times daily.    hydrochlorothiazide (HYDRODIURIL) 12.5 MG tablet Take 12.5 mg by mouth 2 (two) times daily.     insulin aspart (NOVOLOG) 100 UNIT/ML injection Inject 10 Units into the skin 3 (three) times daily before meals. 301-350+ 10 units, 351 or higher NOTIFY MD    insulin glargine (LANTUS) 100 UNIT/ML injection Inject 0.2 mLs (20 Units total) into the skin at bedtime. Qty: 10 mL, Refills: 11    lovastatin (MEVACOR) 20 MG tablet Take 20 mg by mouth at bedtime.    methotrexate (RHEUMATREX) 2.5 MG tablet Take 10 mg by mouth once a week. Patient takes on Monday. Caution:Chemotherapy. Protect from light.    Multiple Vitamins-Minerals (MULTIVITAL) tablet Take 1 tablet by mouth daily.    omeprazole (PRILOSEC) 20 MG capsule Take 1 capsule (20 mg total) by mouth at bedtime. Qty: 90 capsule, Refills: 3    potassium chloride SA (K-DUR,KLOR-CON) 20 MEQ tablet Take 2 tablets (40 mEq total) by mouth 2 (two) times daily. Qty: 14 tablet, Refills: 0    predniSONE (DELTASONE) 10 MG tablet Take 10 mg by mouth daily with breakfast. Until seen by dermatologist    ramipril (ALTACE) 2.5 MG capsule Take 1 capsule (2.5 mg total) by mouth daily.    torsemide (DEMADEX) 20 MG tablet Take 2 tablets (40 mg total) by mouth 2 (two) times daily. Qty: 120 tablet, Refills: 3    Wound Dressings  (SILVASORB) GEL Apply 1 application topically 2 (two) times daily as needed. Qty: 1 Tube, Refills: 3   Associated Diagnoses: Linear IgA bullous dermatosis        If you experience worsening of your admission symptoms, develop shortness of breath, life threatening emergency, suicidal or homicidal thoughts you must seek medical attention immediately by calling 911 or calling your MD immediately  if symptoms less severe.  You Must read complete instructions/literature along with all the possible adverse reactions/side effects for all the Medicines you take and that have been prescribed to you. Take any new Medicines after you have completely understood and accept all the possible adverse reactions/side effects.   Please note  You were cared for by a hospitalist during your hospital stay. If you have any questions about your discharge medications or the care you received while you were in the hospital after you are discharged, you can call the unit and asked to speak with the hospitalist on call if the hospitalist that took care of you  is not available. Once you are discharged, your primary care physician will handle any further medical issues. Please note that NO REFILLS for any discharge medications will be authorized once you are discharged, as it is imperative that you return to your primary care physician (or establish a relationship with a primary care physician if you do not have one) for your aftercare needs so that they can reassess your need for medications and monitor your lab values. Today   SUBJECTIVE   Doing well  VITAL SIGNS:  Blood pressure (!) 97/54, pulse 73, temperature 98.4 F (36.9 C), temperature source Oral, resp. rate 17, height _0  (1.575 m), weight 100 kg (220 lb 7.4 oz), SpO2 92 %.  I/O:   Intake/Output Summary (Last 24 hours) at 06/25/16 0810 Last data filed at 06/24/16 2047  Gross per 24 hour  Intake              483 ml  Output                0 ml  Net               483 ml    PHYSICAL EXAMINATION:  GENERAL:  71 y.o.-year-old patient lying in the bed with no acute distress. obese EYES: Pupils equal, round, reactive to light and accommodation. No scleral icterus. Extraocular muscles intact.  HEENT: Head atraumatic, normocephalic. Oropharynx and nasopharynx clear.  NECK:  Supple, no jugular venous distention. No thyroid enlargement, no tenderness.  LUNGS: Normal breath sounds bilaterally, no wheezing, rales,rhonchi or crepitation. No use of accessory muscles of respiration.  CARDIOVASCULAR: S1, S2 normal. No murmurs, rubs, or gallops.  ABDOMEN: Soft, non-tender, non-distended. Bowel sounds present. No organomegaly or mass.  EXTREMITIES: No pedal edema, cyanosis, or clubbing.  NEUROLOGIC: Cranial nerves II through XII are intact. Muscle strength 5/5 in all extremities. Sensation intact. Gait not checked.  PSYCHIATRIC: The patient is alert and oriented x 3.  SKIN: No obvious rash, lesion, or ulcer.   DATA REVIEW:   CBC   Recent Labs Lab 06/23/16 0120  WBC 12.4*  HGB 9.9*  HCT 29.7*  PLT 204    Chemistries   Recent Labs Lab 06/22/16 2225 06/23/16 0120 06/24/16 1036  NA 124* 128* 133*  K 3.0* 3.6 4.3  CL 78* 86* 97*  CO2 34* 32 29  GLUCOSE 389* 378* 250*  BUN 33* 29* 29*  CREATININE 1.66* 1.30* 0.99  CALCIUM 9.1 8.3* 8.8*  MG  --  1.7  --   AST 60*  --   --   ALT 40  --   --   ALKPHOS 152*  --   --   BILITOT 2.1*  --   --     Microbiology Results   Recent Results (from the past 240 hour(s))  Blood culture (routine x 2)     Status: None (Preliminary result)   Collection Time: 06/22/16 10:26 PM  Result Value Ref Range Status   Specimen Description BLOOD RIGHT AC  Final   Special Requests   Final    BOTTLES DRAWN AEROBIC AND ANAEROBIC Blood Culture adequate volume   Culture NO GROWTH 2 DAYS  Final   Report Status PENDING  Incomplete  Blood culture (routine x 2)     Status: None (Preliminary result)   Collection Time:  06/22/16 10:26 PM  Result Value Ref Range Status   Specimen Description BLOOD LEFT [facility]  Final   Special Requests   Final  BOTTLES DRAWN AEROBIC AND ANAEROBIC Blood Culture results may not be optimal due to an excessive volume of blood received in culture bottles   Culture NO GROWTH 2 DAYS  Final   Report Status PENDING  Incomplete  MRSA PCR Screening     Status: None   Collection Time: 06/23/16  1:12 AM  Result Value Ref Range Status   MRSA by PCR NEGATIVE NEGATIVE Final    Comment:        The GeneXpert MRSA Assay (FDA approved for NASAL specimens only), is one component of a comprehensive MRSA colonization surveillance program. It is not intended to diagnose MRSA infection nor to guide or monitor treatment for MRSA infections.     RADIOLOGY:  Dg Chest 2 View  Result Date: 06/24/2016 CLINICAL DATA:  Pneumonia, shortness of breath with weakness EXAM: CHEST  2 VIEW COMPARISON:  06/22/2016, 05/30/2016 FINDINGS: Cardiomegaly. Trace bilateral pleural effusions. Slightly improved aeration of the left lung base. Streaky bibasilar opacities. Cardiomegaly with central vascular congestion. Aortic atherosclerosis. No pneumothorax. IMPRESSION: 1. Tiny bilateral effusions. Overall improved aeration at the left lung base 2. Streaky bibasilar opacities, favor atelectasis 3. Cardiomegaly with central vascular congestion Electronically Signed   By: Donavan Foil M.D.   On: 06/24/2016 18:38   US Abdomen Limited Ruq  Result Date: 06/23/2016 CLINICAL DATA:  Abdominal pain EXAM: US ABDOMEN LIMITED - RIGHT UPPER QUADRANT COMPARISON:  CT 03/09/2016 FINDINGS: Gallbladder: Calcified gallstone at the neck. Although shadowing is difficult to detect, stone is calcified by CT. No wall thickening or focal tenderness. Common bile duct: Diameter: 5 mm.  Where visualized, no filling defect. Liver: Cirrhotic liver morphology. No mass lesion is seen. Antegrade flow in the imaged hepatic and portal venous system. Trace  peritoneal fluid. IMPRESSION: 1. Cholelithiasis without evidence of cholecystitis. 2. Cirrhosis. Electronically Signed   By: Monte Fantasia M.D.   On: 06/23/2016 10:03     Management plans discussed with the patient, family and they are in agreement.  CODE STATUS:     Code Status Orders        Start     Ordered   06/23/16 0113  Full code  Continuous     06/23/16 0112    Code Status History    Date Active Date Inactive Code Status Order ID Comments User Context   05/31/2016  1:26 AM 06/02/2016  8:27 PM Full Code [id number]  Max Sane, MD Inpatient   12/10/2015  4:55 PM 12/14/2015  9:13 PM Full Code [id number]  Hillary Bow, MD ED   03/13/2015  7:03 PM 03/15/2015  6:55 PM Full Code [id number]  [name], MD Inpatient   07/24/2014  5:53 PM 07/25/2014  6:55 PM Full Code [id number]  Algernon Huxley, MD Inpatient    Advance Directive Documentation     Most Recent Value  Type of Advance Directive  Healthcare Power of [place], Living will  Pre-existing out of facility DNR order (yellow form or pink MOST form)  -  "MOST" Form in Place?  -      TOTAL TIME TAKING CARE OF THIS PATIENT: 40 minutes.    Dakari Stabler M.D on 06/25/2016 at 8:10 AM  Between 7am to 6pm - Pager - [phone] After 6pm go to [email or web address] - Proofreader  [facility]  Office  [phone]  CC: Primary care physician; Mable Paris, FNP

## 2016-06-25 NOTE — Progress Notes (Addendum)
Pt's daughter arrived, pt was assisted with dressing by staff. This Clinical research associate gave pt's daughter script for hospital bed for pt, and discharge instructions were reviewed with pt and her daughter, who both verbalized understanding, and pt signed and received copy. Pt's piv's were removed by this writer at 1030, both catheters intact, pt tolerated well.pt is escorted front entrance of facility via wc at 1110.

## 2016-06-25 NOTE — Progress Notes (Signed)
Shift assessment completed. Pt is easily awakened, is alert and oriented. Dr. Hunt Oris in on rounds at time of assessment. Pt is taken off O2 at 2lnc and placed on room air, after a few minutes, pt's o2 sat is 925 asleep. Lungs are decreased to bilat bases, murmur is auscultated, abdomen is obese and soft, bs heard. Pt has some moisture skin damage to l abdomen.ppp, some general edema noted.Pt is wearing incontinence brief. piv #20 intact to bilat ac's, sites are free of redness and swelling. Pt denied pain. Pt requested hospital bed script from Dr., this was granted and placed in pt chart. bilat forearms are with nearly complete brsuing to anterior side. Pt has call bell in reach.

## 2016-06-25 NOTE — Plan of Care (Signed)
Problem: Bowel/Gastric: Goal: Will not experience complications related to bowel motility Outcome: Adequate for Discharge Pt has met all goals for discharge. Pt due to be dc'd home with 24/7 supervision due to insurance not able to approve rehab.

## 2016-06-27 ENCOUNTER — Telehealth: Payer: Self-pay | Admitting: *Deleted

## 2016-06-27 ENCOUNTER — Encounter: Payer: Medicare Other | Admitting: Physical Therapy

## 2016-06-27 ENCOUNTER — Encounter: Payer: Self-pay | Admitting: Family

## 2016-06-27 DIAGNOSIS — K8020 Calculus of gallbladder without cholecystitis without obstruction: Secondary | ICD-10-CM | POA: Insufficient documentation

## 2016-06-27 DIAGNOSIS — I350 Nonrheumatic aortic (valve) stenosis: Secondary | ICD-10-CM | POA: Insufficient documentation

## 2016-06-27 LAB — CULTURE, BLOOD (ROUTINE X 2)
Culture: NO GROWTH
Culture: NO GROWTH
SPECIAL REQUESTS: ADEQUATE

## 2016-06-27 NOTE — Progress Notes (Deleted)
Subjective:    Patient ID: [name], female    DOB: [birth year], 71 y.o.   MRN: [id number]  CC: [name] is a 71 y.o. female who presents today for follow up.   HPI:   Hospital follow up.   She was discharged 06/25/16.   Admitted 4/25 for  elevated bilirubin, h/o gallstones, AKI, acute hypoxic respiratory failure secondary to PNA.  Presented with weakness. Prior had been hospitalized 4/2 for weakness. She was hypotensive and o2 sat 86% on room air.  Need for home 02?  PNA- suspect left sided. Last cxr 4/27 slightly improved aeration of left lung, central vascular congestion. Received IV maxipine. Blood cultures negative.   RUQ US shows calcified stone at neck  AKI- resumed altace; Crt 4/27 0.99  Respiratory failure- continue toresmide upon d/c  Elevated troponin- 0.03  Anemia- continue iron  chf- last echo 05/2016 EF 60-65%  Insurance denies rehab; D/ced home with [facility]  06/02/16 - last hospitalization Acute on chronic diastolic CHF, NYHA class III She is treated with iv Lasix 40 twice a day. Change back to torsemide 40 mg po bid. Systolic function was normal. The estimated ejection fraction was in the range of 60% to 65% per echo. Per Dr. Fath,cardiology consultation, agree with careful diuresis follow renal function. Will need k repleated. Will consider addition of low dose beta blockers. Would not increase lisinopril due to fixed cardiac output.     HISTORY:  Past Medical History:  Diagnosis Date  . Anemia   . Asthma   . CHF (congestive heart failure) ([place])   . CVA (cerebral vascular accident) ([place])   . Diabetes mellitus without complication ([place])   . GERD (gastroesophageal reflux disease)   . Heart murmur   . History of hiatal hernia   . Hypertension   . Panic attacks   . Peripheral vascular disease ([place])   . Restless leg syndrome   . Shortness of breath dyspnea    Past Surgical History:  Procedure Laterality Date  . ABDOMINAL HYSTERECTOMY    .  BREAST BIOPSY Right yrs ago   benign  . ENDARTERECTOMY Right 07/24/2014   Procedure: ENDARTERECTOMY CAROTID;  Surgeon: Algernon Huxley, MD;  Location: [facility] ORS;  Service: Vascular;  Laterality: Right;  . EYE SURGERY Left    cataract  . FRACTURE SURGERY Left    fractured ankle  . TONSILLECTOMY     Family History  Problem Relation Age of Onset  . Pancreatic cancer Mother   . CAD Father   . Hypertension Father   . Pancreatic cancer Father   . Colon cancer Father   . CAD Brother   . Heart disease Brother     Heart attack  . Breast cancer Maternal Aunt     pt states several maternal aunts    Allergies: Codeine and Liraglutide Current Outpatient Prescriptions on File Prior to Visit  Medication Sig Dispense Refill  . acetaminophen (TYLENOL) 325 MG tablet Take 2 tablets (650 mg total) by mouth every 6 (six) hours as needed for mild pain (or Fever >/= 101).    Marland Kitchen albuterol (PROVENTIL) (2.5 MG/3ML) 0.083% nebulizer solution Take 3 mLs (2.5 mg total) by nebulization every 4 (four) hours as needed for wheezing. 75 mL 12  . Amino Acids-Protein Hydrolys (FEEDING SUPPLEMENT, PRO-STAT SUGAR FREE 64,) LIQD Take 30 mLs by mouth. 30 ml daily for 30 days    . aspirin EC 81 MG tablet Take 81 mg by mouth daily.    Marland Kitchen  cephALEXin (KEFLEX) 500 MG capsule Take 1 capsule (500 mg total) by mouth every 12 (twelve) hours. 8 capsule 0  . Cholecalciferol 1000 units capsule Take 1,000 Units by mouth daily.    . Cyanocobalamin (B-12) 1000 MCG/ML KIT Inject 1,000 mcg as directed every 30 (thirty) days.    . dapsone 100 MG tablet Take 150 mg by mouth daily.     . ferrous sulfate 325 (65 FE) MG tablet Take 1 tablet (325 mg total) by mouth daily with breakfast. 30 tablet 3  . folic acid (FOLVITE) 1 MG tablet Take 1 mg by mouth daily. Take one pill daily on Tuesday, Wednesday, Thursday, Friday, Saturday and Sunday. These are days were patient isn't taking methotrexate    . gabapentin (NEURONTIN) 800 MG tablet Take 800 mg by  mouth 2 (two) times daily.    . hydrochlorothiazide (HYDRODIURIL) 12.5 MG tablet Take 12.5 mg by mouth 2 (two) times daily.     . insulin aspart (NOVOLOG) 100 UNIT/ML injection Inject 10 Units into the skin 3 (three) times daily before meals. 301-350+ 10 units, 351 or higher NOTIFY MD    . insulin glargine (LANTUS) 100 UNIT/ML injection Inject 0.2 mLs (20 Units total) into the skin at bedtime. 10 mL 11  . lovastatin (MEVACOR) 20 MG tablet Take 20 mg by mouth at bedtime.    . magnesium oxide (MAG-OX) 400 (241.3 Mg) MG tablet Take 1 tablet (400 mg total) by mouth 2 (two) times daily. 14 tablet 0  . methotrexate (RHEUMATREX) 2.5 MG tablet Take 10 mg by mouth once a week. Patient takes on Monday. Caution:Chemotherapy. Protect from light.    . Multiple Vitamins-Minerals (MULTIVITAL) tablet Take 1 tablet by mouth daily.    Marland Kitchen omeprazole (PRILOSEC) 20 MG capsule Take 1 capsule (20 mg total) by mouth at bedtime. 90 capsule 3  . potassium chloride SA (K-DUR,KLOR-CON) 20 MEQ tablet Take 2 tablets (40 mEq total) by mouth 2 (two) times daily. 14 tablet 0  . predniSONE (DELTASONE) 10 MG tablet Take 10 mg by mouth daily with breakfast. Until seen by dermatologist    . ramipril (ALTACE) 2.5 MG capsule Take 1 capsule (2.5 mg total) by mouth daily.    Marland Kitchen torsemide (DEMADEX) 20 MG tablet Take 2 tablets (40 mg total) by mouth 2 (two) times daily. 120 tablet 3  . Wound Dressings (SILVASORB) GEL Apply 1 application topically 2 (two) times daily as needed. 1 Tube 3   No current facility-administered medications on file prior to visit.     Social History  Substance Use Topics  . Smoking status: Never Smoker  . Smokeless tobacco: Never Used  . Alcohol use No    Review of Systems    Objective:    There were no vitals taken for this visit. BP Readings from Last 3 Encounters:  06/25/16 (!) 97/54  06/21/16 114/62  06/20/16 114/62   Wt Readings from Last 3 Encounters:  06/23/16 220 lb 7.4 oz (100 kg)  06/21/16  217 lb 4.8 oz (98.6 kg)  06/20/16 217 lb 4.8 oz (98.6 kg)    Physical Exam     Assessment & Plan:   Problem List Items Addressed This Visit    None       I am having Ms. Ellerbrock maintain her hydrochlorothiazide, lovastatin, aspirin EC, gabapentin, ferrous sulfate, acetaminophen, albuterol, B-12, dapsone, omeprazole, SILVASORB, folic acid, methotrexate, torsemide, potassium chloride SA, ramipril, insulin glargine, insulin aspart, Cholecalciferol, MULTIVITAL, feeding supplement (PRO-STAT SUGAR FREE 64), predniSONE, cephALEXin,  and magnesium oxide.   No orders of the defined types were placed in this encounter.   Return precautions given.   Risks, benefits, and alternatives of the medications and treatment plan prescribed today were discussed, and patient expressed understanding.   Education regarding symptom management and diagnosis given to patient on AVS.  Continue to follow with Mable Paris, FNP for routine health maintenance.   [place] and I agreed with plan.   Mable Paris, FNP

## 2016-06-27 NOTE — Telephone Encounter (Signed)
Patient is [employer] and is not TCM eligible. FYI

## 2016-06-27 NOTE — Telephone Encounter (Signed)
Patient stated that she discharged from [facility] on 06/25/16  Pt has been scheduled for a HFU

## 2016-06-29 ENCOUNTER — Encounter: Payer: Medicare Other | Admitting: Physical Therapy

## 2016-06-29 ENCOUNTER — Ambulatory Visit: Payer: Medicare Other | Admitting: Family

## 2016-06-30 ENCOUNTER — Encounter: Payer: Self-pay | Admitting: Emergency Medicine

## 2016-06-30 ENCOUNTER — Ambulatory Visit: Payer: Medicare Other | Admitting: Family

## 2016-06-30 ENCOUNTER — Inpatient Hospital Stay: Payer: Medicare Other

## 2016-06-30 ENCOUNTER — Emergency Department: Payer: Medicare Other

## 2016-06-30 ENCOUNTER — Inpatient Hospital Stay
Admission: EM | Admit: 2016-06-30 | Discharge: 2016-07-05 | DRG: 871 | Disposition: A | Discharge status: Home Health | Payer: Medicare Other | Attending: Internal Medicine | Admitting: Internal Medicine

## 2016-06-30 ENCOUNTER — Telehealth: Payer: Self-pay | Admitting: Family

## 2016-06-30 DIAGNOSIS — F410 Panic disorder [episodic paroxysmal anxiety] without agoraphobia: Secondary | ICD-10-CM | POA: Diagnosis present

## 2016-06-30 DIAGNOSIS — J9621 Acute and chronic respiratory failure with hypoxia: Secondary | ICD-10-CM | POA: Diagnosis not present

## 2016-06-30 DIAGNOSIS — K219 Gastro-esophageal reflux disease without esophagitis: Secondary | ICD-10-CM | POA: Diagnosis present

## 2016-06-30 DIAGNOSIS — E1151 Type 2 diabetes mellitus with diabetic peripheral angiopathy without gangrene: Secondary | ICD-10-CM | POA: Diagnosis present

## 2016-06-30 DIAGNOSIS — L138 Other specified bullous disorders: Secondary | ICD-10-CM

## 2016-06-30 DIAGNOSIS — J440 Chronic obstructive pulmonary disease with acute lower respiratory infection: Secondary | ICD-10-CM | POA: Diagnosis present

## 2016-06-30 DIAGNOSIS — Z8701 Personal history of pneumonia (recurrent): Secondary | ICD-10-CM | POA: Diagnosis not present

## 2016-06-30 DIAGNOSIS — Z8249 Family history of ischemic heart disease and other diseases of the circulatory system: Secondary | ICD-10-CM

## 2016-06-30 DIAGNOSIS — N390 Urinary tract infection, site not specified: Secondary | ICD-10-CM | POA: Diagnosis present

## 2016-06-30 DIAGNOSIS — I110 Hypertensive heart disease with heart failure: Secondary | ICD-10-CM | POA: Diagnosis present

## 2016-06-30 DIAGNOSIS — R6521 Severe sepsis with septic shock: Secondary | ICD-10-CM | POA: Diagnosis present

## 2016-06-30 DIAGNOSIS — Z885 Allergy status to narcotic agent status: Secondary | ICD-10-CM

## 2016-06-30 DIAGNOSIS — I248 Other forms of acute ischemic heart disease: Secondary | ICD-10-CM | POA: Diagnosis present

## 2016-06-30 DIAGNOSIS — J9622 Acute and chronic respiratory failure with hypercapnia: Secondary | ICD-10-CM | POA: Diagnosis not present

## 2016-06-30 DIAGNOSIS — Z888 Allergy status to other drugs, medicaments and biological substances status: Secondary | ICD-10-CM

## 2016-06-30 DIAGNOSIS — Z7189 Other specified counseling: Secondary | ICD-10-CM

## 2016-06-30 DIAGNOSIS — G2581 Restless legs syndrome: Secondary | ICD-10-CM | POA: Diagnosis present

## 2016-06-30 DIAGNOSIS — J9601 Acute respiratory failure with hypoxia: Secondary | ICD-10-CM

## 2016-06-30 DIAGNOSIS — E871 Hypo-osmolality and hyponatremia: Secondary | ICD-10-CM | POA: Diagnosis present

## 2016-06-30 DIAGNOSIS — I5033 Acute on chronic diastolic (congestive) heart failure: Secondary | ICD-10-CM | POA: Diagnosis present

## 2016-06-30 DIAGNOSIS — J9600 Acute respiratory failure, unspecified whether with hypoxia or hypercapnia: Secondary | ICD-10-CM

## 2016-06-30 DIAGNOSIS — Z803 Family history of malignant neoplasm of breast: Secondary | ICD-10-CM

## 2016-06-30 DIAGNOSIS — R188 Other ascites: Secondary | ICD-10-CM | POA: Diagnosis present

## 2016-06-30 DIAGNOSIS — Z794 Long term (current) use of insulin: Secondary | ICD-10-CM | POA: Diagnosis not present

## 2016-06-30 DIAGNOSIS — L139 Bullous disorder, unspecified: Secondary | ICD-10-CM | POA: Diagnosis present

## 2016-06-30 DIAGNOSIS — Z515 Encounter for palliative care: Secondary | ICD-10-CM

## 2016-06-30 DIAGNOSIS — Z90710 Acquired absence of both cervix and uterus: Secondary | ICD-10-CM

## 2016-06-30 DIAGNOSIS — Z7952 Long term (current) use of systemic steroids: Secondary | ICD-10-CM | POA: Diagnosis not present

## 2016-06-30 DIAGNOSIS — J159 Unspecified bacterial pneumonia: Secondary | ICD-10-CM | POA: Diagnosis present

## 2016-06-30 DIAGNOSIS — I959 Hypotension, unspecified: Secondary | ICD-10-CM | POA: Diagnosis not present

## 2016-06-30 DIAGNOSIS — Z800 Family history of malignant neoplasm of digestive organs: Secondary | ICD-10-CM

## 2016-06-30 DIAGNOSIS — F329 Major depressive disorder, single episode, unspecified: Secondary | ICD-10-CM | POA: Diagnosis present

## 2016-06-30 DIAGNOSIS — R0902 Hypoxemia: Secondary | ICD-10-CM

## 2016-06-30 DIAGNOSIS — E785 Hyperlipidemia, unspecified: Secondary | ICD-10-CM | POA: Diagnosis present

## 2016-06-30 DIAGNOSIS — Z8673 Personal history of transient ischemic attack (TIA), and cerebral infarction without residual deficits: Secondary | ICD-10-CM | POA: Diagnosis not present

## 2016-06-30 DIAGNOSIS — J441 Chronic obstructive pulmonary disease with (acute) exacerbation: Secondary | ICD-10-CM | POA: Diagnosis present

## 2016-06-30 DIAGNOSIS — I350 Nonrheumatic aortic (valve) stenosis: Secondary | ICD-10-CM | POA: Diagnosis present

## 2016-06-30 DIAGNOSIS — A419 Sepsis, unspecified organism: Secondary | ICD-10-CM | POA: Diagnosis present

## 2016-06-30 LAB — BLOOD GAS, VENOUS
PATIENT TEMPERATURE: 37
PCO2 VEN: 54 mmHg (ref 44.0–60.0)
PO2 VEN: 35 mmHg (ref 32.0–45.0)
pH, Ven: 7.43 (ref 7.250–7.430)

## 2016-06-30 LAB — COMPREHENSIVE METABOLIC PANEL
ALBUMIN: 3.1 g/dL — AB (ref 3.5–5.0)
ALT: 53 U/L (ref 14–54)
AST: 74 U/L — AB (ref 15–41)
Alkaline Phosphatase: 156 U/L — ABNORMAL HIGH (ref 38–126)
Anion gap: 7 (ref 5–15)
BUN: 11 mg/dL (ref 6–20)
CHLORIDE: 95 mmol/L — AB (ref 101–111)
CO2: 30 mmol/L (ref 22–32)
Calcium: 9 mg/dL (ref 8.9–10.3)
Creatinine, Ser: 0.73 mg/dL (ref 0.44–1.00)
GFR calc Af Amer: >60 mL/min (ref >60)
Glucose, Bld: 314 mg/dL — ABNORMAL HIGH (ref 65–99)
POTASSIUM: 5 mmol/L (ref 3.5–5.1)
SODIUM: 132 mmol/L — AB (ref 135–145)
Total Bilirubin: 1.4 mg/dL — ABNORMAL HIGH (ref 0.3–1.2)
Total Protein: 6.2 g/dL — ABNORMAL LOW (ref 6.5–8.1)

## 2016-06-30 LAB — CBC WITH DIFFERENTIAL/PLATELET
BASOS PCT: 1 %
Basophils Absolute: 0 10*3/uL (ref 0–0.1)
EOS ABS: 0 10*3/uL (ref 0–0.7)
EOS PCT: 0 %
HCT: 29.9 % — ABNORMAL LOW (ref 35.0–47.0)
Hemoglobin: 9.7 g/dL — ABNORMAL LOW (ref 12.0–16.0)
Lymphocytes Relative: 6 %
Lymphs Abs: 0.4 10*3/uL — ABNORMAL LOW (ref 1.0–3.6)
MCH: 31.1 pg (ref 26.0–34.0)
MCHC: 32.5 g/dL (ref 32.0–36.0)
MCV: 95.6 fL (ref 80.0–100.0)
MONO ABS: 0.4 10*3/uL (ref 0.2–0.9)
MONOS PCT: 5 %
Neutro Abs: 6.2 10*3/uL (ref 1.4–6.5)
Neutrophils Relative %: 88 %
PLATELETS: 152 10*3/uL (ref 150–440)
RBC: 3.13 MIL/uL — ABNORMAL LOW (ref 3.80–5.20)
RDW: 17.7 % — AB (ref 11.5–14.5)
WBC: 7 10*3/uL (ref 3.6–11.0)

## 2016-06-30 LAB — GLUCOSE, CAPILLARY: Glucose-Capillary: 275 mg/dL — ABNORMAL HIGH (ref 65–99)

## 2016-06-30 LAB — URINALYSIS, COMPLETE (UACMP) WITH MICROSCOPIC
Bilirubin Urine: NEGATIVE
Hgb urine dipstick: NEGATIVE
Ketones, ur: NEGATIVE mg/dL
NITRITE: NEGATIVE
PH: 8 (ref 5.0–8.0)
Protein, ur: 30 mg/dL — AB
Specific Gravity, Urine: 1.014 (ref 1.005–1.030)

## 2016-06-30 LAB — MAGNESIUM: Magnesium: 2.5 mg/dL — ABNORMAL HIGH (ref 1.7–2.4)

## 2016-06-30 LAB — TROPONIN I: TROPONIN I: 0.04 ng/mL — AB (ref <0.03)

## 2016-06-30 LAB — BRAIN NATRIURETIC PEPTIDE: B NATRIURETIC PEPTIDE 5: 104 pg/mL — AB (ref 0.0–100.0)

## 2016-06-30 LAB — PROCALCITONIN: PROCALCITONIN: 0.23 ng/mL

## 2016-06-30 LAB — LACTIC ACID, PLASMA: LACTIC ACID, VENOUS: 1.4 mmol/L (ref 0.5–1.9)

## 2016-06-30 MED ORDER — PRAVASTATIN SODIUM 20 MG PO TABS
20.0000 mg | ORAL_TABLET | Freq: Every day | ORAL | Status: DC
Start: 2016-07-01 — End: 2016-07-05
  Administered 2016-07-01 – 2016-07-04 (×4): 20 mg via ORAL
  Filled 2016-06-30 (×4): qty 1

## 2016-06-30 MED ORDER — NOREPINEPHRINE BITARTRATE 1 MG/ML IV SOLN
0.0000 ug/min | Freq: Once | INTRAVENOUS | Status: AC
  Administered 2016-06-30: 4 ug/min via INTRAVENOUS
  Filled 2016-06-30: qty 4

## 2016-06-30 MED ORDER — INSULIN GLARGINE 100 UNIT/ML ~~LOC~~ SOLN
20.0000 [IU] | Freq: Every day | SUBCUTANEOUS | Status: DC
  Administered 2016-06-30: 20 [IU] via SUBCUTANEOUS
  Filled 2016-06-30 (×2): qty 0.2

## 2016-06-30 MED ORDER — DEXTROMETHORPHAN POLISTIREX ER 30 MG/5ML PO SUER
30.0000 mg | Freq: Two times a day (BID) | ORAL | Status: DC
  Administered 2016-06-30: 30 mg via ORAL
  Filled 2016-06-30 (×2): qty 5

## 2016-06-30 MED ORDER — IPRATROPIUM-ALBUTEROL 0.5-2.5 (3) MG/3ML IN SOLN
3.0000 mL | Freq: Four times a day (QID) | RESPIRATORY_TRACT | Status: DC
  Administered 2016-07-01 – 2016-07-02 (×6): 3 mL via RESPIRATORY_TRACT
  Filled 2016-06-30 (×7): qty 3

## 2016-06-30 MED ORDER — HYDROCORTISONE NA SUCCINATE PF 100 MG IJ SOLR
100.0000 mg | Freq: Once | INTRAMUSCULAR | Status: AC
  Administered 2016-06-30: 100 mg via INTRAVENOUS
  Filled 2016-06-30: qty 2

## 2016-06-30 MED ORDER — SODIUM CHLORIDE 0.9% FLUSH
3.0000 mL | Freq: Two times a day (BID) | INTRAVENOUS | Status: DC
  Administered 2016-06-30 – 2016-07-04 (×9): 3 mL via INTRAVENOUS

## 2016-06-30 MED ORDER — SODIUM CHLORIDE 0.9 % IV SOLN
INTRAVENOUS | Status: DC
  Administered 2016-06-30: 23:00:00 via INTRAVENOUS

## 2016-06-30 MED ORDER — ACETAMINOPHEN 325 MG PO TABS: 650.0000 mg | ORAL_TABLET | Freq: Four times a day (QID) | ORAL | Status: DC | PRN

## 2016-06-30 MED ORDER — IPRATROPIUM-ALBUTEROL 0.5-2.5 (3) MG/3ML IN SOLN
3.0000 mL | Freq: Once | RESPIRATORY_TRACT | Status: AC
  Administered 2016-06-30: 3 mL via RESPIRATORY_TRACT
  Filled 2016-06-30: qty 3

## 2016-06-30 MED ORDER — MAGNESIUM OXIDE 400 (241.3 MG) MG PO TABS
400.0000 mg | ORAL_TABLET | Freq: Two times a day (BID) | ORAL | Status: DC
  Administered 2016-06-30 – 2016-07-05 (×10): 400 mg via ORAL
  Filled 2016-06-30 (×12): qty 1

## 2016-06-30 MED ORDER — GABAPENTIN 400 MG PO CAPS
800.0000 mg | ORAL_CAPSULE | Freq: Two times a day (BID) | ORAL | Status: DC
  Administered 2016-06-30 – 2016-07-05 (×10): 800 mg via ORAL
  Filled 2016-06-30 (×10): qty 2

## 2016-06-30 MED ORDER — ONDANSETRON HCL 4 MG PO TABS: 4.0000 mg | ORAL_TABLET | Freq: Four times a day (QID) | ORAL | Status: DC | PRN

## 2016-06-30 MED ORDER — SODIUM CHLORIDE 0.9 % IV BOLUS (SEPSIS)
1000.0000 mL | Freq: Once | INTRAVENOUS | Status: AC
  Administered 2016-06-30: 1000 mL via INTRAVENOUS

## 2016-06-30 MED ORDER — FOLIC ACID 1 MG PO TABS
1.0000 mg | ORAL_TABLET | Freq: Every day | ORAL | Status: DC
  Administered 2016-07-01 – 2016-07-05 (×5): 1 mg via ORAL
  Filled 2016-06-30 (×5): qty 1

## 2016-06-30 MED ORDER — SENNOSIDES-DOCUSATE SODIUM 8.6-50 MG PO TABS
1.0000 | ORAL_TABLET | Freq: Every evening | ORAL | Status: DC | PRN
  Filled 2016-06-30: qty 1

## 2016-06-30 MED ORDER — GUAIFENESIN-DM 100-10 MG/5ML PO SYRP: 5.0000 mL | ORAL_SOLUTION | ORAL | Status: DC | PRN

## 2016-06-30 MED ORDER — VANCOMYCIN HCL IN DEXTROSE 1-5 GM/200ML-% IV SOLN
1000.0000 mg | Freq: Two times a day (BID) | INTRAVENOUS | Status: DC
Start: 2016-07-01 — End: 2016-07-01
  Administered 2016-07-01: 1000 mg via INTRAVENOUS
  Filled 2016-06-30 (×3): qty 200

## 2016-06-30 MED ORDER — DAPSONE 25 MG PO TABS
150.0000 mg | ORAL_TABLET | Freq: Every day | ORAL | Status: DC
  Administered 2016-07-01 – 2016-07-05 (×5): 150 mg via ORAL
  Filled 2016-06-30 (×5): qty 2

## 2016-06-30 MED ORDER — ACETAMINOPHEN 650 MG RE SUPP: 650.0000 mg | Freq: Four times a day (QID) | RECTAL | Status: DC | PRN

## 2016-06-30 MED ORDER — ASPIRIN EC 81 MG PO TBEC
81.0000 mg | DELAYED_RELEASE_TABLET | Freq: Every day | ORAL | Status: DC
  Administered 2016-07-01 – 2016-07-05 (×5): 81 mg via ORAL
  Filled 2016-06-30 (×5): qty 1

## 2016-06-30 MED ORDER — ENOXAPARIN SODIUM 40 MG/0.4ML ~~LOC~~ SOLN
40.0000 mg | SUBCUTANEOUS | Status: DC
  Administered 2016-07-01 – 2016-07-04 (×4): 40 mg via SUBCUTANEOUS
  Filled 2016-06-30 (×4): qty 0.4

## 2016-06-30 MED ORDER — PANTOPRAZOLE SODIUM 40 MG PO TBEC: 40.0000 mg | DELAYED_RELEASE_TABLET | Freq: Every day | ORAL | Status: DC

## 2016-06-30 MED ORDER — ONDANSETRON HCL 4 MG/2ML IJ SOLN: 4.0000 mg | Freq: Four times a day (QID) | INTRAMUSCULAR | Status: DC | PRN

## 2016-06-30 MED ORDER — VASOPRESSIN 20 UNIT/ML IV SOLN
4.0000 [IU] | Freq: Once | INTRAVENOUS | Status: AC
  Administered 2016-06-30: 4 [IU] via INTRAVENOUS
  Filled 2016-06-30: qty 0.2

## 2016-06-30 MED ORDER — GUAIFENESIN ER 600 MG PO TB12
600.0000 mg | ORAL_TABLET | Freq: Two times a day (BID) | ORAL | Status: DC
  Administered 2016-06-30: 600 mg via ORAL
  Filled 2016-06-30: qty 1

## 2016-06-30 MED ORDER — CEFEPIME-DEXTROSE 1 GM/50ML IV SOLR
1.0000 g | Freq: Three times a day (TID) | INTRAVENOUS | Status: DC
  Administered 2016-06-30: 1 g via INTRAVENOUS
  Filled 2016-06-30 (×3): qty 50

## 2016-06-30 MED ORDER — INSULIN ASPART 100 UNIT/ML ~~LOC~~ SOLN
0.0000 [IU] | Freq: Every day | SUBCUTANEOUS | Status: DC
  Administered 2016-06-30: 3 [IU] via SUBCUTANEOUS
  Filled 2016-06-30: qty 3

## 2016-06-30 MED ORDER — FERROUS SULFATE 325 (65 FE) MG PO TABS
325.0000 mg | ORAL_TABLET | Freq: Every day | ORAL | Status: DC
  Administered 2016-07-01 – 2016-07-05 (×5): 325 mg via ORAL
  Filled 2016-06-30 (×5): qty 1

## 2016-06-30 MED ORDER — INSULIN ASPART 100 UNIT/ML ~~LOC~~ SOLN
10.0000 [IU] | Freq: Three times a day (TID) | SUBCUTANEOUS | Status: DC
  Administered 2016-07-01: 10 [IU] via SUBCUTANEOUS

## 2016-06-30 MED ORDER — INSULIN ASPART 100 UNIT/ML ~~LOC~~ SOLN
0.0000 [IU] | Freq: Three times a day (TID) | SUBCUTANEOUS | Status: DC
  Administered 2016-07-01: 9 [IU] via SUBCUTANEOUS
  Filled 2016-06-30: qty 9

## 2016-06-30 MED ORDER — DM-GUAIFENESIN ER 30-600 MG PO TB12: 1.0000 | ORAL_TABLET | Freq: Two times a day (BID) | ORAL | Status: DC

## 2016-06-30 MED ORDER — VANCOMYCIN HCL IN DEXTROSE 1-5 GM/200ML-% IV SOLN
1000.0000 mg | Freq: Once | INTRAVENOUS | Status: AC
  Administered 2016-06-30: 1000 mg via INTRAVENOUS
  Filled 2016-06-30: qty 200

## 2016-06-30 MED ORDER — ALBUTEROL SULFATE (2.5 MG/3ML) 0.083% IN NEBU: 2.5000 mg | INHALATION_SOLUTION | RESPIRATORY_TRACT | Status: DC | PRN

## 2016-06-30 MED ORDER — CEFEPIME HCL 2 G IJ SOLR
2.0000 g | Freq: Three times a day (TID) | INTRAMUSCULAR | Status: DC
  Administered 2016-07-01: 2 g via INTRAVENOUS
  Filled 2016-06-30 (×4): qty 2

## 2016-06-30 MED ORDER — IOPAMIDOL (ISOVUE-370) INJECTION 76%
75.0000 mL | Freq: Once | INTRAVENOUS | Status: AC | PRN
  Administered 2016-06-30: 75 mL via INTRAVENOUS

## 2016-06-30 MED ORDER — METHYLPREDNISOLONE SODIUM SUCC 125 MG IJ SOLR
60.0000 mg | Freq: Four times a day (QID) | INTRAMUSCULAR | Status: DC
  Administered 2016-06-30 – 2016-07-01 (×2): 60 mg via INTRAVENOUS
  Filled 2016-06-30 (×2): qty 2

## 2016-06-30 NOTE — ED Triage Notes (Signed)
Patient presents to ED via [facility] from home. Patient was recently discharged from [facility] for pneumonia. Patient states she was suppose to be sent home on oxygen but never was. Patient called out for SOB. FD report SpO2 70% on scene, they placed patient on a non rebreather and got her SpO2 up to 91%. Patient currently on 4L [place], SpO2 91%. Patient covered in urine upon arrival. Patient denies SOB or any pain at this time. Even and non labored respirations noted.

## 2016-06-30 NOTE — ED Notes (Signed)
Patient transported to CT 

## 2016-06-30 NOTE — ED Provider Notes (Addendum)
[facility] Emergency Department Provider Note    First MD Initiated Contact with Patient 06/30/16 1718     (approximate)  I have reviewed the triage vital signs and the nursing notes.   HISTORY  Chief Complaint Shortness of Breath    HPI [name] is a 71 y.o. female with chief complaint of weakness and shortness of breath. Patient with multiple chronic medical conditions as listed below with recent admission to the hospital for pneumonia on antibiotics. Patient denies any pain but does appear likely ill. Patient with moderate respiratory distress requiring supplemental oxygenation with nasal cannula. Also admits to productive cough. No measured fevers.  Denies any abdominal pain or back pain.   Past Medical History:  Diagnosis Date  . Anemia   . Asthma   . CHF (congestive heart failure) ([place])   . CVA (cerebral vascular accident) ([place])   . Diabetes mellitus without complication ([place])   . GERD (gastroesophageal reflux disease)   . Heart murmur   . History of hiatal hernia   . Hypertension   . Panic attacks   . Peripheral vascular disease ([place])   . Restless leg syndrome   . Shortness of breath dyspnea    Family History  Problem Relation Age of Onset  . Pancreatic cancer Mother   . CAD Father   . Hypertension Father   . Pancreatic cancer Father   . Colon cancer Father   . CAD Brother   . Heart disease Brother     Heart attack  . Breast cancer Maternal Aunt     pt states several maternal aunts   Past Surgical History:  Procedure Laterality Date  . ABDOMINAL HYSTERECTOMY    . BREAST BIOPSY Right yrs ago   benign  . ENDARTERECTOMY Right 07/24/2014   Procedure: ENDARTERECTOMY CAROTID;  Surgeon: Algernon Huxley, MD;  Location: [facility] ORS;  Service: Vascular;  Laterality: Right;  . EYE SURGERY Left    cataract  . FRACTURE SURGERY Left    fractured ankle  . TONSILLECTOMY     Patient Active Problem List   Diagnosis Date Noted  .  Cholelithiasis 06/27/2016  . Aortic stenosis 06/27/2016  . Sepsis ([place]) 06/22/2016  . Pain of upper abdomen 03/10/2016  . Morbid obesity ([place]) 02/02/2016  . Benign hypertensive heart disease with CHF (congestive heart failure) ([place]) 01/06/2016  . Routine physical examination 12/30/2015  . Recurrent falls 12/13/2015  . Fatty liver 12/10/2015  . Fracture of multiple ribs 11/25/2015  . B12 deficiency anemia 11/03/2015  . GERD (gastroesophageal reflux disease) 10/17/2015  . Pure hypercholesterolemia 10/17/2015  . Uncontrolled type 2 diabetes mellitus with diabetic neuropathy, with long-term current use of insulin ([place]) 10/05/2015  . Depression 10/05/2015  . 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 CHF (congestive heart failure) ([place]) 03/13/2015  . Carotid stenosis 07/24/2014      Prior to Admission medications   Medication Sig Start Date End Date Taking? Authorizing Provider  acetaminophen (TYLENOL) 325 MG tablet Take 2 tablets (650 mg total) by mouth every 6 (six) hours as needed for mild pain (or Fever >/= 101). 12/14/15   Nicholes Mango, MD  albuterol (PROVENTIL) (2.5 MG/3ML) 0.083% nebulizer solution Take 3 mLs (2.5 mg total) by nebulization every 4 (four) hours as needed for wheezing. 12/14/15   Nicholes Mango, MD  Amino Acids-Protein Hydrolys (FEEDING SUPPLEMENT, PRO-STAT SUGAR FREE 64,) LIQD Take 30 mLs by mouth. 30 ml daily for 30 days 06/16/16   Historical Provider, MD  aspirin EC 81 MG tablet Take 81 mg by mouth daily.    Historical Provider, MD  cephALEXin (KEFLEX) 500 MG capsule Take 1 capsule (500 mg total) by mouth every 12 (twelve) hours. 06/25/16   Fritzi Mandes, MD  Cholecalciferol 1000 units capsule Take 1,000 Units by mouth daily.    Historical Provider, MD  Cyanocobalamin (B-12) 1000 MCG/ML KIT Inject 1,000 mcg as directed every 30 (thirty) days. 02/02/16   Historical Provider, MD  dapsone 100 MG tablet Take 150 mg by mouth daily.     Historical Provider, MD  ferrous sulfate  325 (65 FE) MG tablet Take 1 tablet (325 mg total) by mouth daily with breakfast. 11/03/15   Burnard Hawthorne, FNP  folic acid (FOLVITE) 1 MG tablet Take 1 mg by mouth daily. Take one pill daily on Tuesday, Wednesday, Thursday, Friday, Saturday and Sunday. These are days were patient isn't taking methotrexate    Historical Provider, MD  gabapentin (NEURONTIN) 800 MG tablet Take 800 mg by mouth 2 (two) times daily.    Historical Provider, MD  hydrochlorothiazide (HYDRODIURIL) 12.5 MG tablet Take 12.5 mg by mouth 2 (two) times daily.     Historical Provider, MD  insulin aspart (NOVOLOG) 100 UNIT/ML injection Inject 10 Units into the skin 3 (three) times daily before meals. 301-350+ 10 units, 351 or higher NOTIFY MD    Historical Provider, MD  insulin glargine (LANTUS) 100 UNIT/ML injection Inject 0.2 mLs (20 Units total) into the skin at bedtime. 06/02/16   Demetrios Loll, MD  lovastatin (MEVACOR) 20 MG tablet Take 20 mg by mouth at bedtime.    Historical Provider, MD  magnesium oxide (MAG-OX) 400 (241.3 Mg) MG tablet Take 1 tablet (400 mg total) by mouth 2 (two) times daily. 06/25/16   Fritzi Mandes, MD  methotrexate (RHEUMATREX) 2.5 MG tablet Take 10 mg by mouth once a week. Patient takes on Monday. Caution:Chemotherapy. Protect from light.    Historical Provider, MD  Multiple Vitamins-Minerals (MULTIVITAL) tablet Take 1 tablet by mouth daily.    Historical Provider, MD  omeprazole (PRILOSEC) 20 MG capsule Take 1 capsule (20 mg total) by mouth at bedtime. 03/09/16   Burnard Hawthorne, FNP  potassium chloride SA (K-DUR,KLOR-CON) 20 MEQ tablet Take 2 tablets (40 mEq total) by mouth 2 (two) times daily. 06/02/16   Demetrios Loll, MD  predniSONE (DELTASONE) 10 MG tablet Take 10 mg by mouth daily with breakfast. Until seen by dermatologist    Historical Provider, MD  ramipril (ALTACE) 2.5 MG capsule Take 1 capsule (2.5 mg total) by mouth daily. 06/03/16   Demetrios Loll, MD  torsemide (DEMADEX) 20 MG tablet Take 2 tablets (40 mg  total) by mouth 2 (two) times daily. 05/30/16 06/29/16  Alisa Graff, FNP  Wound Dressings (SILVASORB) GEL Apply 1 application topically 2 (two) times daily as needed. 04/25/16   Burnard Hawthorne, FNP    Allergies Codeine and Liraglutide    Social History Social History  Substance Use Topics  . Smoking status: Never Smoker  . Smokeless tobacco: Never Used  . Alcohol use No    Review of Systems Patient denies headaches, rhinorrhea, blurry vision, numbness, shortness of breath, chest pain, edema, cough, abdominal pain, nausea, vomiting, diarrhea, dysuria, fevers, rashes or hallucinations unless otherwise stated above in HPI. ____________________________________________   PHYSICAL EXAM:  VITAL SIGNS: Vitals:   06/30/16 1714  BP: (!) 92/34  Pulse: 90  Resp: 18  Temp: 98.6 F (37 C)    Constitutional:  Alert and oriented. Ill appearing mderate resp acute distress. Eyes: Conjunctivae are normal. PERRL. EOMI. Head: Atraumatic. Nose: No congestion/rhinnorhea. Mouth/Throat: Mucous membranes are moist.  Oropharynx non-erythematous. Neck: No stridor. Painless ROM. No cervical spine tenderness to palpation Hematological/Lymphatic/Immunilogical: No cervical lymphadenopathy. Cardiovascular: Normal rate, regular rhythm.harsh crescendo decrescendo holosystolic murmur  Good peripheral circulation. Respiratory: tachypnea, use of accessory muscles, diffuse ronchorus breathsounds Gastrointestinal: Soft and nontender. No distention. No abdominal bruits. No CVA tenderness. Genitourinary:  Musculoskeletal: No lower extremity tenderness nor edema.  No joint effusions. Neurologic:  Normal speech and language. No gross focal neurologic deficits are appreciated. No gait instability. Skin:  Skin is warm, dry and intact. No rash noted. Psychiatric: Mood and affect are normal. Speech and behavior are normal.  ____________________________________________   LABS (all labs ordered are listed, but  only abnormal results are displayed)  No results found for this or any previous visit (from the past 24 hour(s)). ____________________________________________  EKG My review and personal interpretation at Time: 17:33   Indication: sob  Rate: 90  Rhythm: sinus Axis: left Other: poor r wave progression, no STEMI ____________________________________________  RADIOLOGY  I personally reviewed all radiographic images ordered to evaluate for the above acute complaints and reviewed radiology reports and findings.  These findings were personally discussed with the patient.  Please see medical record for radiology report.  ____________________________________________   PROCEDURES  Procedure(s) performed:  Procedures    Critical Care performed: yes CRITICAL CARE Performed by: Merlyn Lot   Total critical care time: 60 minutes  Critical care time was exclusive of separately billable procedures and treating other patients.  Critical care was necessary to treat or prevent imminent or life-threatening deterioration.  Critical care was time spent personally by me on the following activities: development of treatment plan with patient and/or surrogate as well as nursing, discussions with consultants, evaluation of patient's response to treatment, examination of patient, obtaining history from patient or surrogate, ordering and performing treatments and interventions, ordering and review of laboratory studies, ordering and review of radiographic studies, pulse oximetry and re-evaluation of patient's condition.  ____________________________________________   INITIAL IMPRESSION / ASSESSMENT AND PLAN / ED COURSE  Pertinent labs & imaging results that were available during my care of the patient were reviewed by me and considered in my medical decision making (see chart for details).  DDX: Asthma, copd, CHF, pna, ptx, malignancy, Pe, anemia   FRANCETTA ILG is a 71 y.o. who presents  to the ED with acute hypoxic respiratory failure with recent admission to the hospital. Patient with complex presentation given underlying CHF, COPD and recent pneumonia. Patient currently on antibiotics but arrives hypotensive and hypoxic requiring supplemental nasal cannula oxygen. Patient currently protecting her airway. We'll proceed with septic workup.  Clinical Course as of May 04 0000  Thu Jun 30, 2016  1837 BP improving.  Patient still with marked tachypnea and based on her frailty and recent hypertension I do feel the patient would benefit from BiPAP for further respiratory assistance.  [PR]  2025 Patient was unable to tolerate BiPAP due to worsening hypotension. BiPAP was stopped and she had somewhat improvement in her blood pressure. She still mentating but does feel weak. Additional IV fluids provided but bedside ultrasound shows a minimally collapsible IVC. She has no significant right heart enlargement. There is no septal bowing. She has no pericardial effusion. Repeat EKG shows no evidence of acute ischemia.  [PR]    Clinical Course User Index [PR] Merlyn Lot, MD  ____________________________________________   FINAL CLINICAL IMPRESSION(S) / ED DIAGNOSES  Final diagnoses:  Acute respiratory failure with hypoxia (HCC)  Severe aortic stenosis  Sepsis, due to unspecified organism ([place])  Hypoxia      NEW MEDICATIONS STARTED DURING THIS VISIT:  New Prescriptions   No medications on file     Note:  This document was prepared using Dragon voice recognition software and may include unintentional dictation errors.    Merlyn Lot, MD 07/01/16 0001    Merlyn Lot, MD 07/01/16 0001

## 2016-06-30 NOTE — Progress Notes (Signed)
Pharmacy Antibiotic Note  [name] is a 71 y.o. female admitted on 06/30/2016 with sepsis/bronchitis.  Pharmacy has been consulted for vancomycin and cefepime dosing.  Plan: DW 71kg  Vd 50L kei 0.066 hr-1  t1/2 11 hours Vancomycin 1 gram q 12 hours ordered with stacked dosing. Level before 5th dose. Goal trough 15-20.  Cefepime 2 grams q 8 hours ordered for patient with sepsis.  Height: 5\' 3"  (160 cm) Weight: 220 lb (99.8 kg) IBW/kg (Calculated) : 52.4  Temp (24hrs), Avg:98.6 F (37 C), Min:98.6 F (37 C), Max:98.6 F (37 C)   Recent Labs Lab 06/24/16 1036 06/30/16 1720  WBC  --  7.0  CREATININE 0.99 0.73  LATICACIDVEN  --  1.4    Estimated Creatinine Clearance: 73.8 mL/min (by C-G formula based on SCr of 0.73 mg/dL).    Allergies  Allergen Reactions  . Codeine Other (See Comments)    Reaction:  Dizziness   . Liraglutide     Other reaction(s): nausea and stomach pain    Antimicrobials this admission: vancomycin cefepime >>    >>   Dose adjustments this admission:   Microbiology results: 5/3 BCx: pending 5/3 UCx: pending  4/26 MRSA PCR: (-)       5/3 CXR: atelectasis vs. pneumonia 5/3 UA: LE(+) NO2(-) WBC 6-30 Thank you for allowing pharmacy to be a part of this patient's care.  Rubert Frediani S 06/30/2016 11:05 PM

## 2016-06-30 NOTE — ED Notes (Signed)
RTTcalled to place bipap.

## 2016-06-30 NOTE — Consult Note (Signed)
Name: [name] MRN: [id number] DOB: 09/11/1945    ADMISSION DATE:  06/30/2016 CONSULTATION DATE:  06/30/16  REFERRING MD : Dr. Bridgett Larsson  CHIEF COMPLAINT:  Shortness of breath  BRIEF PATIENT DESCRIPTION: 71 year old nursing home resident was recently admission with Pneumonia.  Now presenting with shortness of breath and hypotension  SIGNIFICANT EVENTS  4/26/ - 4/28 Admitted to [facility] for PNA 4/28>> Discharged to nursing home 5/3>> Present with shortness of breath and hypotension  STUDIES:  05/31/16 ECHO>>Left ventricle: Wall thickness was increased in a pattern of mild  LVH. Systolic function was normal. The estimated ejection  fraction was in the range of 60% to 65%   HISTORY OF PRESENT ILLNESS:  [name] is a 71 year old nursing home resident with known history of CHF,COPD,CVA,DM,HTN,PVD and restless leg syndrome. Patient had multiple hospital admission in the past month.  Patient was admitted from 4/2-4/5 for Acute on chronic CHF exacerbation.  Patient was recently admitted from 4/26 -4/28 and discharged to nursing home after getting treated for PNA.  Patient presents to ED with sob and 3 days history of cough and wheezing. She also states that she had fever,chills and generalized weakness.  She was found to be hypoxemic in the ED and was placed on Bipap.  Patient was also noted to be hypotensive and her BP did not respond to fluids per the hospitalist therefore she was started on pressors.Hospitalist admitted the patient and PCCM team was consulted for further management.  PAST MEDICAL HISTORY :   has a past medical history of Anemia; Asthma; CHF (congestive heart failure) ([place]); CVA (cerebral vascular accident) ([place]); Diabetes mellitus without complication ([place]); GERD (gastroesophageal reflux disease); Heart murmur; History of hiatal hernia; Hypertension; Panic attacks; Peripheral vascular disease ([place]); Restless leg syndrome; and Shortness of breath dyspnea.  has a past surgical  history that includes Abdominal hysterectomy; Fracture surgery (Left); Tonsillectomy; Eye surgery (Left); Breast biopsy (Right, yrs ago); and Endarterectomy (Right, 07/24/2014). Prior to Admission medications   Medication Sig Start Date End Date Taking? Authorizing Provider  acetaminophen (TYLENOL) 325 MG tablet Take 2 tablets (650 mg total) by mouth every 6 (six) hours as needed for mild pain (or Fever >/= 101). 12/14/15  Yes Nicholes Mango, MD  albuterol (PROVENTIL) (2.5 MG/3ML) 0.083% nebulizer solution Take 3 mLs (2.5 mg total) by nebulization every 4 (four) hours as needed for wheezing. 12/14/15  Yes Nicholes Mango, MD  Amino Acids-Protein Hydrolys (FEEDING SUPPLEMENT, PRO-STAT SUGAR FREE 64,) LIQD Take 30 mLs by mouth. 30 ml daily for 30 days 06/16/16  Yes Historical Provider, MD  aspirin EC 81 MG tablet Take 81 mg by mouth daily.   Yes Historical Provider, MD  cephALEXin (KEFLEX) 500 MG capsule Take 1 capsule (500 mg total) by mouth every 12 (twelve) hours. 06/25/16  Yes Fritzi Mandes, MD  Cholecalciferol 1000 units capsule Take 1,000 Units by mouth daily.   Yes Historical Provider, MD  Cyanocobalamin (B-12) 1000 MCG/ML KIT Inject 1,000 mcg as directed every 30 (thirty) days. 02/02/16  Yes Historical Provider, MD  dapsone 100 MG tablet Take 150 mg by mouth daily.    Yes Historical Provider, MD  ferrous sulfate 325 (65 FE) MG tablet Take 1 tablet (325 mg total) by mouth daily with breakfast. 11/03/15  Yes Burnard Hawthorne, FNP  folic acid (FOLVITE) 1 MG tablet Take 1 mg by mouth daily. Take one pill daily on Tuesday, Wednesday, Thursday, Friday, Saturday and Sunday. These are days were patient isn't  taking methotrexate   Yes Historical Provider, MD  gabapentin (NEURONTIN) 800 MG tablet Take 800 mg by mouth 2 (two) times daily.   Yes Historical Provider, MD  hydrochlorothiazide (HYDRODIURIL) 12.5 MG tablet Take 12.5 mg by mouth 2 (two) times daily.    Yes Historical Provider, MD  insulin aspart (NOVOLOG) 100  UNIT/ML injection Inject 10 Units into the skin 3 (three) times daily before meals. 301-350+ 10 units, 351 or higher NOTIFY MD   Yes Historical Provider, MD  insulin glargine (LANTUS) 100 UNIT/ML injection Inject 0.2 mLs (20 Units total) into the skin at bedtime. 06/02/16  Yes Demetrios Loll, MD  lovastatin (MEVACOR) 20 MG tablet Take 20 mg by mouth at bedtime.   Yes Historical Provider, MD  magnesium oxide (MAG-OX) 400 (241.3 Mg) MG tablet Take 1 tablet (400 mg total) by mouth 2 (two) times daily. 06/25/16  Yes Fritzi Mandes, MD  methotrexate (RHEUMATREX) 2.5 MG tablet Take 10 mg by mouth once a week. Patient takes on Monday. Caution:Chemotherapy. Protect from light.   Yes Historical Provider, MD  Multiple Vitamins-Minerals (MULTIVITAL) tablet Take 1 tablet by mouth daily.   Yes Historical Provider, MD  omeprazole (PRILOSEC) 20 MG capsule Take 1 capsule (20 mg total) by mouth at bedtime. 03/09/16  Yes Burnard Hawthorne, FNP  potassium chloride SA (K-DUR,KLOR-CON) 20 MEQ tablet Take 2 tablets (40 mEq total) by mouth 2 (two) times daily. 06/02/16  Yes Demetrios Loll, MD  predniSONE (DELTASONE) 10 MG tablet Take 10 mg by mouth daily with breakfast. Until seen by dermatologist   Yes Historical Provider, MD  ramipril (ALTACE) 2.5 MG capsule Take 1 capsule (2.5 mg total) by mouth daily. 06/03/16  Yes Demetrios Loll, MD  torsemide (DEMADEX) 20 MG tablet Take 2 tablets (40 mg total) by mouth 2 (two) times daily. 05/30/16 06/30/16 Yes Alisa Graff, FNP  Wound Dressings (SILVASORB) GEL Apply 1 application topically 2 (two) times daily as needed. 04/25/16  Yes Burnard Hawthorne, FNP   Allergies  Allergen Reactions  . Codeine Other (See Comments)    Reaction:  Dizziness   . Liraglutide     Other reaction(s): nausea and stomach pain    FAMILY HISTORY:  family history includes Breast cancer in her maternal aunt; CAD in her brother and father; Colon cancer in her father; Heart disease in her brother; Hypertension in her father;  Pancreatic cancer in her father and mother. SOCIAL HISTORY:  reports that she has never smoked. She has never used smokeless tobacco. She reports that she does not drink alcohol or use drugs.  REVIEW OF SYSTEMS:   Constitutional: Negative for fever, chills, weight loss, malaise/fatigue and diaphoresis.  HENT: Negative for hearing loss, ear pain, nosebleeds, congestion, sore throat, neck pain, tinnitus and ear discharge.   Eyes: Negative for blurred vision, double vision, photophobia, pain, discharge and redness.  Respiratory: Negative for cough, hemoptysis, sputum production, shortness of breathuction, wheezing and stridor.   Cardiovascular: Negative for chest pain, palpitations, orthopnea, claudication, leg swelling and PND.  Gastrointestinal: Negative for heartburn, nausea, vomiting, abdominal pain, diarrhea, constipation, blood in stool and melena.  Genitourinary: Negative for dysuria, urgency, frequency, hematuria and flank pain.  Musculoskeletal: Negative for myalgias, back pain, joint pain and falls.  Skin: Negative for itching and rash.  Neurological: Negative for dizziness, tingling, tremors, sensory change, speech change, focal weakness, seizures, loss of consciousness, weakness and headaches.  Endo/Heme/Allergies: Negative for environmental allergies and polydipsia. Does not bruise/bleed easily.  SUBJECTIVE: Patient states that "  she never felt better after her last hospitalization and had nonproductive cough throughout".  VITAL SIGNS: Temp:  [98.6 F (37 C)] 98.6 F (37 C) (05/03 1714) Pulse Rate:  [83-99] 86 (05/03 2120) Resp:  [15-25] 17 (05/03 2120) BP: (68-148)/(26-87) 104/59 (05/03 2120) SpO2:  [92 %-98 %] 92 % (05/03 2120) Weight:  [220 lb (99.8 kg)] 220 lb (99.8 kg) (05/03 1710)  PHYSICAL EXAMINATION: General:  Elderly ,Caucasian female, on [place], in no acute distress Neuro:  Awake, alert, oriented HEENT:  AT,[place],No JVD Cardiovascular:  S1S2,regular, no JVD Lungs:   Coarse left side,diminished bases Abdomen:  Obese,round, active bowel sounds Musculoskeletal:  No edema,cyanosis noted Skin:  Large area of skin break down right groin, Multiple bruises and skin break down    Recent Labs Lab 06/24/16 1036 06/30/16 1720  NA 133* 132*  K 4.3 5.0  CL 97* 95*  CO2 29 30  BUN 29* 11  CREATININE 0.99 0.73  GLUCOSE 250* 314*    Recent Labs Lab 06/30/16 1720  HGB 9.7*  HCT 29.9*  WBC 7.0  PLT 152   Dg Chest Port 1 View  Result Date: 06/30/2016 CLINICAL DATA:  Increasing shortness of breath, acute hypoxia. Recent diagnosis of pneumonia. History of CHF, hypertension, diabetes. EXAM: PORTABLE CHEST 1 VIEW COMPARISON:  Chest radiograph June 24, 2016 FINDINGS: Cardiac silhouette is mildly enlarged and unchanged. Mild pulmonary vascular congestion. Improved aeration LEFT lung base with LEFT costophrenic angle blunting and strandy densities. No pneumothorax. Old RIGHT rib fractures. Large body habitus. Osteopenia. IMPRESSION: Similar cardiomegaly and pulmonary vascular congestion. Small LEFT pleural effusion and LEFT lung base atelectasis versus pneumonia. Electronically Signed   By: Elon Alas M.D.   On: 06/30/2016 18:35    ASSESSMENT / PLAN:  Acute On chronic respiratory failure secondary to COPD exacerbation/Left lower lobe PNA Pulmonary edema Small left pleural effussion Chronic diastolic CHF. Shock secondary to sepsis possible source urine and PNA UTI IgA bullous dermatitis Mildly elevated troponins secondary to demand Ischemia Hyponatremia  Hx of Diabetes [place] Hx of Hyperlipidemia  Plan Continue BiPAP, wean as tolerated Levophed gtt Keep MAP goal >60 Blood glucose checks will sliding scale insulin coverage Will not continue dapsone and methotrexate for now Continue Cefipime Follow cultures Monitor fever, CBC Steroids, taper Antitussives Bronchodilators Continue Pravachol. Continue Aspirin Hold Torsemide,Ramipril and  HCTZ    Merton Border, MD PCCM service Mobile [phone] Pager [phone] 07/01/2016 1:13 PM  Pulmonary and [place]

## 2016-06-30 NOTE — H&P (Addendum)
[place] [facility] [place] NAME: [name]    MR#:  [id number]  DATE OF BIRTH:  24-Mar-1945  DATE OF ADMISSION:  06/30/2016  PRIMARY CARE PHYSICIAN: Mable Paris, FNP   REQUESTING/REFERRING PHYSICIAN: Merlyn Lot, MD  CHIEF COMPLAINT:   Chief Complaint  Patient presents with  . Code Sepsis   Worsening cough and shortness breath for the past 3 days. HISTORY OF PRESENT ILLNESS:  [name]  is a 71 y.o. female with a known history of Recent pneumonia, COPD, CHF, CVA, PVD, hypertension and diabetes. The patient was recently admitted to for pneumonia. She was discharged 5 days ago. She started to have a cough, wheezing and shortness breath 3 days ago. In addition, she has fever, chills and generalized weakness. She was found hypoxia in the ED, put on oxygen and BiPAP. She is treated with IV steroids and nebulizer, taken off BiPAP, on O2 [place] 4 L now. She was found hypotensive with blood pressure at 68/43, given normal saline bolus with minimal improvement. Chest x-ray show Small LEFT pleural effusion and LEFT lung base atelectasis versus pneumonia PAST MEDICAL HISTORY:   Past Medical History:  Diagnosis Date  . Anemia   . Asthma   . CHF (congestive heart failure) ([place])   . CVA (cerebral vascular accident) ([place])   . Diabetes mellitus without complication ([place])   . GERD (gastroesophageal reflux disease)   . Heart murmur   . History of hiatal hernia   . Hypertension   . Panic attacks   . Peripheral vascular disease ([place])   . Restless leg syndrome   . Shortness of breath dyspnea     PAST SURGICAL HISTORY:   Past Surgical History:  Procedure Laterality Date  . ABDOMINAL HYSTERECTOMY    . BREAST BIOPSY Right yrs ago   benign  . ENDARTERECTOMY Right 07/24/2014   Procedure: ENDARTERECTOMY CAROTID;  Surgeon: Algernon Huxley, MD;  Location: [facility] ORS;  Service: Vascular;  Laterality: Right;  . EYE SURGERY Left    cataract  . FRACTURE  SURGERY Left    fractured ankle  . TONSILLECTOMY      SOCIAL HISTORY:   Social History  Substance Use Topics  . Smoking status: Never Smoker  . Smokeless tobacco: Never Used  . Alcohol use No    FAMILY HISTORY:   Family History  Problem Relation Age of Onset  . Pancreatic cancer Mother   . CAD Father   . Hypertension Father   . Pancreatic cancer Father   . Colon cancer Father   . CAD Brother   . Heart disease Brother     Heart attack  . Breast cancer Maternal Aunt     pt states several maternal aunts    DRUG ALLERGIES:   Allergies  Allergen Reactions  . Codeine Other (See Comments)    Reaction:  Dizziness   . Liraglutide     Other reaction(s): nausea and stomach pain    REVIEW OF SYSTEMS:   Review of Systems  Constitutional: Positive for chills, fever and malaise/fatigue.  HENT: Negative for congestion and sore throat.   Eyes: Negative for blurred vision.  Respiratory: Positive for cough, sputum production, shortness of breath and wheezing. Negative for hemoptysis and stridor.   Cardiovascular: Negative for chest pain, palpitations and leg swelling.  Gastrointestinal: Negative for abdominal pain, blood in stool, constipation, diarrhea, melena, nausea and vomiting.  Genitourinary: Negative for dysuria, flank pain, hematuria and urgency.  Musculoskeletal: Negative  for back pain.  Skin: Negative for itching and rash.  Neurological: Positive for dizziness and weakness. Negative for focal weakness, loss of consciousness and headaches.  Psychiatric/Behavioral: Negative for depression. The patient is not nervous/anxious.     MEDICATIONS AT HOME:   Prior to Admission medications   Medication Sig Start Date End Date Taking? Authorizing Provider  acetaminophen (TYLENOL) 325 MG tablet Take 2 tablets (650 mg total) by mouth every 6 (six) hours as needed for mild pain (or Fever >/= 101). 12/14/15  Yes Nicholes Mango, MD  albuterol (PROVENTIL) (2.5 MG/3ML) 0.083% nebulizer  solution Take 3 mLs (2.5 mg total) by nebulization every 4 (four) hours as needed for wheezing. 12/14/15  Yes Nicholes Mango, MD  Amino Acids-Protein Hydrolys (FEEDING SUPPLEMENT, PRO-STAT SUGAR FREE 64,) LIQD Take 30 mLs by mouth. 30 ml daily for 30 days 06/16/16  Yes Historical Provider, MD  aspirin EC 81 MG tablet Take 81 mg by mouth daily.   Yes Historical Provider, MD  cephALEXin (KEFLEX) 500 MG capsule Take 1 capsule (500 mg total) by mouth every 12 (twelve) hours. 06/25/16  Yes Fritzi Mandes, MD  Cholecalciferol 1000 units capsule Take 1,000 Units by mouth daily.   Yes Historical Provider, MD  Cyanocobalamin (B-12) 1000 MCG/ML KIT Inject 1,000 mcg as directed every 30 (thirty) days. 02/02/16  Yes Historical Provider, MD  dapsone 100 MG tablet Take 150 mg by mouth daily.    Yes Historical Provider, MD  ferrous sulfate 325 (65 FE) MG tablet Take 1 tablet (325 mg total) by mouth daily with breakfast. 11/03/15  Yes Burnard Hawthorne, FNP  folic acid (FOLVITE) 1 MG tablet Take 1 mg by mouth daily. Take one pill daily on Tuesday, Wednesday, Thursday, Friday, Saturday and Sunday. These are days were patient isn't taking methotrexate   Yes Historical Provider, MD  gabapentin (NEURONTIN) 800 MG tablet Take 800 mg by mouth 2 (two) times daily.   Yes Historical Provider, MD  hydrochlorothiazide (HYDRODIURIL) 12.5 MG tablet Take 12.5 mg by mouth 2 (two) times daily.    Yes Historical Provider, MD  insulin aspart (NOVOLOG) 100 UNIT/ML injection Inject 10 Units into the skin 3 (three) times daily before meals. 301-350+ 10 units, 351 or higher NOTIFY MD   Yes Historical Provider, MD  insulin glargine (LANTUS) 100 UNIT/ML injection Inject 0.2 mLs (20 Units total) into the skin at bedtime. 06/02/16  Yes Demetrios Loll, MD  lovastatin (MEVACOR) 20 MG tablet Take 20 mg by mouth at bedtime.   Yes Historical Provider, MD  magnesium oxide (MAG-OX) 400 (241.3 Mg) MG tablet Take 1 tablet (400 mg total) by mouth 2 (two) times daily.  06/25/16  Yes Fritzi Mandes, MD  methotrexate (RHEUMATREX) 2.5 MG tablet Take 10 mg by mouth once a week. Patient takes on Monday. Caution:Chemotherapy. Protect from light.   Yes Historical Provider, MD  Multiple Vitamins-Minerals (MULTIVITAL) tablet Take 1 tablet by mouth daily.   Yes Historical Provider, MD  omeprazole (PRILOSEC) 20 MG capsule Take 1 capsule (20 mg total) by mouth at bedtime. 03/09/16  Yes Burnard Hawthorne, FNP  potassium chloride SA (K-DUR,KLOR-CON) 20 MEQ tablet Take 2 tablets (40 mEq total) by mouth 2 (two) times daily. 06/02/16  Yes Demetrios Loll, MD  predniSONE (DELTASONE) 10 MG tablet Take 10 mg by mouth daily with breakfast. Until seen by dermatologist   Yes Historical Provider, MD  ramipril (ALTACE) 2.5 MG capsule Take 1 capsule (2.5 mg total) by mouth daily. 06/03/16  Yes Demetrios Loll,  MD  torsemide (DEMADEX) 20 MG tablet Take 2 tablets (40 mg total) by mouth 2 (two) times daily. 05/30/16 06/30/16 Yes Alisa Graff, FNP  Wound Dressings (SILVASORB) GEL Apply 1 application topically 2 (two) times daily as needed. 04/25/16  Yes Burnard Hawthorne, FNP      VITAL SIGNS:  Blood pressure (!) 97/40, pulse 91, temperature 98.6 F (37 C), temperature source Oral, resp. rate 20, height _0  (1.6 m), weight 220 lb (99.8 kg), SpO2 92 %.  PHYSICAL EXAMINATION:  Physical Exam  GENERAL:  71 y.o.-year-old patient lying in the bed with no acute distress. Obese. EYES: Pupils equal, round, reactive to light and accommodation. No scleral icterus. Extraocular muscles intact.  HEENT: Head atraumatic, normocephalic. Oropharynx and nasopharynx clear.  NECK:  Supple, no jugular venous distention. No thyroid enlargement, no tenderness.  LUNGS: Normal breath sounds bilaterally, no wheezing, rales,rhonchi or crepitation. No use of accessory muscles of respiration.  CARDIOVASCULAR: S1, S2 normal. Systolic murmurs 3/6, no rubs, or gallops.  ABDOMEN: Soft, nontender, nondistended. Bowel sounds present. No  organomegaly or mass.  EXTREMITIES: No pedal edema, cyanosis, or clubbing.  NEUROLOGIC: Cranial nerves II through XII are intact. Muscle strength 5/5 in all extremities. Sensation intact. Gait not checked.  PSYCHIATRIC: The patient is alert and oriented x 3.  SKIN: No obvious rash, lesion, or ulcer.   LABORATORY PANEL:   CBC  Recent Labs Lab 06/30/16 1720  WBC 7.0  HGB 9.7*  HCT 29.9*  PLT 152   ------------------------------------------------------------------------------------------------------------------  Chemistries   Recent Labs Lab 06/30/16 1720  NA 132*  K 5.0  CL 95*  CO2 30  GLUCOSE 314*  BUN 11  CREATININE 0.73  CALCIUM 9.0  AST 74*  ALT 53  ALKPHOS 156*  BILITOT 1.4*   ------------------------------------------------------------------------------------------------------------------  Cardiac Enzymes  Recent Labs Lab 06/30/16 1720  TROPONINI 0.04*   ------------------------------------------------------------------------------------------------------------------  RADIOLOGY:  Dg Chest Port 1 View  Result Date: 06/30/2016 CLINICAL DATA:  Increasing shortness of breath, acute hypoxia. Recent diagnosis of pneumonia. History of CHF, hypertension, diabetes. EXAM: PORTABLE CHEST 1 VIEW COMPARISON:  Chest radiograph June 24, 2016 FINDINGS: Cardiac silhouette is mildly enlarged and unchanged. Mild pulmonary vascular congestion. Improved aeration LEFT lung base with LEFT costophrenic angle blunting and strandy densities. No pneumothorax. Old RIGHT rib fractures. Large body habitus. Osteopenia. IMPRESSION: Similar cardiomegaly and pulmonary vascular congestion. Small LEFT pleural effusion and LEFT lung base atelectasis versus pneumonia. Electronically Signed   By: Elon Alas M.D.   On: 06/30/2016 18:35      IMPRESSION AND PLAN:   Acute respiratory failure with hypoxia due to COPD exacerbation and acute bronchitis versus PNA (HAP). The patient will be  admitted to stepdown unit. Start IV Solu-Medrol, DuoNeb every 6 hours, continue cefepime, Robitussin when necessary and Mucinex every 12 hours. Follow-up CT angiogram to rule out PE.  Hypotension. The patient was given normal saline bolus without improvement.   Start Levophed, vasopressin was given once. Hold torsemide, HCTZ and lisinopril.  Elevated troponin. Due to demanding ischemia. Continue aspirin and statin.  Hyponatremia. On normal saline IV, follow-up BMP.  Chronic diastolic CHF. Stable. Hold torsemide and HCTZ for now. Diabetes. Continue Levemir and start sliding scale.  All the records are reviewed and case discussed with ED provider. Management plans discussed with the patient, her daughter and caregiver and they are in agreement.  CODE STATUS: Full code  TOTAL CRITICAL TIME TAKING CARE OF THIS PATIENT: 62 minutes.    Bridgett Larsson, Sheppard Evens  M.D on 06/30/2016 at 9:10 PM  Between 7am to 6pm - Pager - [phone]  After 6pm go to [email or web address] - Proofreader  [facility]  Office  [phone]  CC: Primary care physician; Mable Paris, FNP   Note: This dictation was prepared with Dragon dictation along with smaller phrase technology. Any transcriptional errors that result from this process are unintentional.

## 2016-06-30 NOTE — ED Notes (Addendum)
In and out cath completed. Patient tolerated well. Cloudy amber urine returned. Soiled brief and linen changed.

## 2016-06-30 NOTE — Telephone Encounter (Signed)
Shannon [phone] called from [facility] regarding pt O2 stats was in the 70-80s temp 101.1 and still has pneumonia symptoms. EMS was called pt is on her to [facility]. Carollee Herter just wanted to inform. Thank you!

## 2016-06-30 NOTE — ED Notes (Signed)
Date and time results received: 06/30/16 2005 (use smartphrase ".now" to insert current time)  Test: B/P  Critical Value: 76/28  Name of Provider Notified: Roxan Hockey, MD  Orders Received? Or Actions Taken?:BIPAP STOPPED, NS BOLUS STARTED, 100 ML INFUSED, STOPPED. vERBAL ORDER Lyndon Code, MD. RTT CALLED.

## 2016-07-01 DIAGNOSIS — I959 Hypotension, unspecified: Secondary | ICD-10-CM

## 2016-07-01 DIAGNOSIS — J9621 Acute and chronic respiratory failure with hypoxia: Secondary | ICD-10-CM

## 2016-07-01 DIAGNOSIS — J9622 Acute and chronic respiratory failure with hypercapnia: Secondary | ICD-10-CM

## 2016-07-01 LAB — BASIC METABOLIC PANEL
ANION GAP: 5 (ref 5–15)
BUN: 12 mg/dL (ref 6–20)
CALCIUM: 8.5 mg/dL — AB (ref 8.9–10.3)
CO2: 30 mmol/L (ref 22–32)
Chloride: 98 mmol/L — ABNORMAL LOW (ref 101–111)
Creatinine, Ser: 0.7 mg/dL (ref 0.44–1.00)
GFR calc Af Amer: >60 mL/min (ref >60)
GLUCOSE: 451 mg/dL — AB (ref 65–99)
POTASSIUM: 5 mmol/L (ref 3.5–5.1)
SODIUM: 133 mmol/L — AB (ref 135–145)

## 2016-07-01 LAB — GLUCOSE, CAPILLARY
GLUCOSE-CAPILLARY: 391 mg/dL — AB (ref 65–99)
GLUCOSE-CAPILLARY: 399 mg/dL — AB (ref 65–99)
GLUCOSE-CAPILLARY: 392 mg/dL — AB (ref 65–99)
GLUCOSE-CAPILLARY: 316 mg/dL — AB (ref 65–99)
GLUCOSE-CAPILLARY: 227 mg/dL — AB (ref 65–99)

## 2016-07-01 LAB — CBC
HCT: 28.3 % — ABNORMAL LOW (ref 35.0–47.0)
HEMOGLOBIN: 9.1 g/dL — AB (ref 12.0–16.0)
MCH: 31.4 pg (ref 26.0–34.0)
MCHC: 32.2 g/dL (ref 32.0–36.0)
MCV: 97.6 fL (ref 80.0–100.0)
Platelets: 128 10*3/uL — ABNORMAL LOW (ref 150–440)
RBC: 2.9 MIL/uL — ABNORMAL LOW (ref 3.80–5.20)
RDW: 17.5 % — AB (ref 11.5–14.5)
WBC: 6.2 10*3/uL (ref 3.6–11.0)

## 2016-07-01 LAB — PROTIME-INR
INR: 1.1
Prothrombin Time: 14.2 seconds (ref 11.4–15.2)

## 2016-07-01 LAB — PROCALCITONIN: PROCALCITONIN: 0.24 ng/mL

## 2016-07-01 LAB — CORTISOL: CORTISOL PLASMA: 92 ug/dL

## 2016-07-01 LAB — MRSA PCR SCREENING: MRSA by PCR: NEGATIVE

## 2016-07-01 LAB — TROPONIN I: Troponin I: 0.03 ng/mL (ref <0.03)

## 2016-07-01 MED ORDER — FUROSEMIDE 20 MG PO TABS
20.0000 mg | ORAL_TABLET | Freq: Every day | ORAL | Status: DC
  Administered 2016-07-01 – 2016-07-02 (×2): 20 mg via ORAL
  Filled 2016-07-01 (×2): qty 1

## 2016-07-01 MED ORDER — PREDNISONE 10 MG PO TABS
10.0000 mg | ORAL_TABLET | Freq: Every day | ORAL | Status: DC
  Administered 2016-07-02 – 2016-07-05 (×4): 10 mg via ORAL
  Filled 2016-07-01 (×4): qty 1

## 2016-07-01 MED ORDER — ALBUTEROL SULFATE (2.5 MG/3ML) 0.083% IN NEBU: 2.5000 mg | INHALATION_SOLUTION | RESPIRATORY_TRACT | Status: DC | PRN

## 2016-07-01 MED ORDER — BENZONATATE 100 MG PO CAPS
100.0000 mg | ORAL_CAPSULE | Freq: Three times a day (TID) | ORAL | Status: DC | PRN
  Administered 2016-07-02: 100 mg via ORAL
  Filled 2016-07-01: qty 1

## 2016-07-01 MED ORDER — INSULIN GLARGINE 100 UNIT/ML ~~LOC~~ SOLN
20.0000 [IU] | Freq: Every day | SUBCUTANEOUS | Status: DC
  Administered 2016-07-01 – 2016-07-05 (×5): 20 [IU] via SUBCUTANEOUS
  Filled 2016-07-01 (×7): qty 0.2

## 2016-07-01 MED ORDER — ORAL CARE MOUTH RINSE
15.0000 mL | Freq: Two times a day (BID) | OROMUCOSAL | Status: DC
  Administered 2016-07-01 (×2): 15 mL via OROMUCOSAL

## 2016-07-01 MED ORDER — HYDROCOD POLST-CPM POLST ER 10-8 MG/5ML PO SUER
5.0000 mL | Freq: Every evening | ORAL | Status: DC | PRN
  Administered 2016-07-02: 5 mL via ORAL
  Filled 2016-07-01 (×2): qty 5

## 2016-07-01 MED ORDER — INSULIN ASPART 100 UNIT/ML ~~LOC~~ SOLN
0.0000 [IU] | Freq: Three times a day (TID) | SUBCUTANEOUS | Status: DC
  Administered 2016-07-01: 15 [IU] via SUBCUTANEOUS
  Administered 2016-07-01: 20 [IU] via SUBCUTANEOUS
  Administered 2016-07-02: 7 [IU] via SUBCUTANEOUS
  Administered 2016-07-02 (×2): 4 [IU] via SUBCUTANEOUS
  Administered 2016-07-03 (×2): 7 [IU] via SUBCUTANEOUS
  Administered 2016-07-03: 4 [IU] via SUBCUTANEOUS
  Administered 2016-07-04: 20 [IU] via SUBCUTANEOUS
  Administered 2016-07-04: 7 [IU] via SUBCUTANEOUS
  Administered 2016-07-05: 3 [IU] via SUBCUTANEOUS
  Administered 2016-07-05: 7 [IU] via SUBCUTANEOUS
  Filled 2016-07-01: qty 4
  Filled 2016-07-01: qty 7
  Filled 2016-07-01: qty 4
  Filled 2016-07-01: qty 3
  Filled 2016-07-01: qty 15
  Filled 2016-07-01: qty 7
  Filled 2016-07-01 (×2): qty 20
  Filled 2016-07-01 (×2): qty 7
  Filled 2016-07-01: qty 4
  Filled 2016-07-01: qty 7

## 2016-07-01 MED ORDER — INSULIN ASPART 100 UNIT/ML ~~LOC~~ SOLN
0.0000 [IU] | Freq: Every day | SUBCUTANEOUS | Status: DC
  Administered 2016-07-01 – 2016-07-04 (×3): 2 [IU] via SUBCUTANEOUS
  Filled 2016-07-01 (×3): qty 2

## 2016-07-01 MED ORDER — LEVOFLOXACIN 500 MG PO TABS
500.0000 mg | ORAL_TABLET | Freq: Every day | ORAL | Status: AC
  Administered 2016-07-01 – 2016-07-05 (×5): 500 mg via ORAL
  Filled 2016-07-01 (×5): qty 1

## 2016-07-01 NOTE — Care Management (Addendum)
Patient received from ICU. O2 acute. History of [facility] for home health and they have been notified of patient need for HRI- this has been requested from Dr. Aram Beecham PA also. She has a recent history of going to [facility] for SNF also. PT pending. Please assess (using template) of patient ambulatory status/need for home O2.

## 2016-07-01 NOTE — Progress Notes (Signed)
[facility] [place] at [facility]   PATIENT NAME: [name]    MR#:  [id number]  DATE OF BIRTH:  11-20-1945  SUBJECTIVE:  CHIEF COMPLAINT:   Chief Complaint  Patient presents with  . Code Sepsis   Patient is 71 year old Caucasian female with past medical history significant for history of moderate to severe aortic stenosis, normal ejection fraction on echocardiogram in April 2018, COPD, CHF, stroke, peripheral vascular disease, hypertension, diabetes, who was recently admitted for pneumonia, and discharged about 5 days ago, who presents to the hospital with cough, wheezes, shortness of breath, fever, chills and generalized weakness. She was noted to be hypoxic, was initiated on BiPAP and admitted to intensive care unit. She was found to be relatively hypotensive with systolic blood pressure in 70s over 40s initially, requiring vasopressin, Levophed infusion. Patient was initiated on antibiotic therapy, including vancomycin, levofloxacin,  steroids, nebulizers, she feels somewhat more comfortable now. CT angiogram of the chest revealed left lower lobe pneumonia, left pleural effusion, ascites. Patient was weaned down from 4 L down to 2 L via nasal cannula and being transferred to regular medical floor. Blood cultures are negative so far, sputum culture is pending. Review of Systems  Respiratory: Positive for cough, sputum production and shortness of breath.     VITAL SIGNS: Blood pressure 134/61, pulse 85, temperature 97.8 F (36.6 C), temperature source Oral, resp. rate 18, height 5\' 2"  (1.575 m), weight 96.9 kg (213 lb 10 oz), SpO2 92 %.  PHYSICAL EXAMINATION:   GENERAL:  71 y.o.-year-old patient lying in the bed in mild to moderate respiratory distress, tachypnea, intermittently coughing, and uncomfortable laying down.  EYES: Pupils equal, round, reactive to light and accommodation. No scleral icterus. Extraocular muscles intact.  HEENT: Head atraumatic,  normocephalic. Oropharynx and nasopharynx clear.  NECK:  Supple, positive for jugular venous distention. No thyroid enlargement, no tenderness.  LUNGS: Normal breath sounds bilaterally, no wheezing, rales,rhonchi or crepitation, diminished at bases, mostly on the left. Intermittent use of accessory muscles of respiration, especially with dry cough.  CARDIOVASCULAR: S1, S2 , rhythm is regular,. 2/6 systolic murmurs with her at precordially, distant sounds, no rubs, or gallops.  ABDOMEN: Soft, nontender, nondistended. Bowel sounds present. No organomegaly or mass.  EXTREMITIES: 1+ lower extremity and pedal edema, no cyanosis, or clubbing.  NEUROLOGIC: Cranial nerves II through XII are intact. Muscle strength 5/5 in all extremities. Sensation intact. Gait not checked.  PSYCHIATRIC: The patient is alert and oriented x 3.  SKIN: No obvious rash, lesion, or ulcer.   ORDERS/RESULTS REVIEWED:   CBC  Recent Labs Lab 06/30/16 1720 07/01/16 0333  WBC 7.0 6.2  HGB 9.7* 9.1*  HCT 29.9* 28.3*  PLT 152 128*  MCV 95.6 97.6  MCH 31.1 31.4  MCHC 32.5 32.2  RDW 17.7* 17.5*  LYMPHSABS 0.4*  --   MONOABS 0.4  --   EOSABS 0.0  --   BASOSABS 0.0  --    ------------------------------------------------------------------------------------------------------------------  Chemistries   Recent Labs Lab 06/30/16 1720 06/30/16 1736 07/01/16 0333  NA 132*  --  133*  K 5.0  --  5.0  CL 95*  --  98*  CO2 30  --  30  GLUCOSE 314*  --  451*  BUN 11  --  12  CREATININE 0.73  --  0.70  CALCIUM 9.0  --  8.5*  MG  --  2.5*  --   AST 74*  --   --  ALT 53  --   --   ALKPHOS 156*  --   --   BILITOT 1.4*  --   --    ------------------------------------------------------------------------------------------------------------------ estimated creatinine clearance is 71.1 mL/min (by C-G formula based on SCr of 0.7  mg/dL). ------------------------------------------------------------------------------------------------------------------ No results for input(s): TSH, T4TOTAL, T3FREE, THYROIDAB in the last 72 hours.  Invalid input(s): FREET3  Cardiac Enzymes  Recent Labs Lab 06/30/16 1720 07/01/16 0333  TROPONINI 0.04* 0.03*   ------------------------------------------------------------------------------------------------------------------ Invalid input(s): POCBNP ---------------------------------------------------------------------------------------------------------------  RADIOLOGY: Ct Angio Chest Pe W Or Wo Contrast  Result Date: 06/30/2016 CLINICAL DATA:  Pneumonia shortness of breath EXAM: CT ANGIOGRAPHY CHEST WITH CONTRAST TECHNIQUE: Multidetector CT imaging of the chest was performed using the standard protocol during bolus administration of intravenous contrast. Multiplanar CT image reconstructions and MIPs were obtained to evaluate the vascular anatomy. CONTRAST:  75 mL Isovue 370 intravenous COMPARISON:  Chest x-ray 06/30/2016, CT chest 12/21/2013 FINDINGS: Cardiovascular: Respiratory motion artifact limits the exam. No definite central filling defects are visualized. Non aneurysmal aorta. Aortic atherosclerosis. No dissection. Coronary artery calcification. Mild cardiomegaly. No large pericardial effusion. Mediastinum/Nodes: Punctate calcifications in the left lobe of the thyroid. Midline trachea. No significantly enlarged mediastinal lymph nodes. Esophagus within normal limits. Lungs/Pleura: Small left-sided pleural effusion with left lower lobe partial atelectasis or pneumonia. No pneumothorax. Mild hazy bilateral densities may reflect diffuse atelectasis or mild edema. Upper Abdomen: Small amount of ascites around the liver and spleen. Spleen upper normal at 14 cm. Musculoskeletal: Degenerative changes. No suspicious bone lesion. Multiple old right lower anterior rib fractures. Review of the MIP  images confirms the above findings. IMPRESSION: 1. Respiratory motion artifact limits the exam. No definite acute embolus visualized. Negative for aortic dissection 2. Small left pleural effusion with left lower lobe partial atelectasis or pneumonia 3. Small amount of ascites around the liver and spleen. Electronically Signed   By: Jasmine Pang M.D.   On: 06/30/2016 22:15   Dg Chest Port 1 View  Result Date: 06/30/2016 CLINICAL DATA:  Increasing shortness of breath, acute hypoxia. Recent diagnosis of pneumonia. History of CHF, hypertension, diabetes. EXAM: PORTABLE CHEST 1 VIEW COMPARISON:  Chest radiograph June 24, 2016 FINDINGS: Cardiac silhouette is mildly enlarged and unchanged. Mild pulmonary vascular congestion. Improved aeration LEFT lung base with LEFT costophrenic angle blunting and strandy densities. No pneumothorax. Old RIGHT rib fractures. Large body habitus. Osteopenia. IMPRESSION: Similar cardiomegaly and pulmonary vascular congestion. Small LEFT pleural effusion and LEFT lung base atelectasis versus pneumonia. Electronically Signed   By: Awilda Metro M.D.   On: 06/30/2016 18:35    EKG:  Orders placed or performed during the hospital encounter of 06/30/16  . ED EKG 12-Lead  . ED EKG 12-Lead  . EKG 12-Lead  . EKG 12-Lead  . EKG 12-Lead  . EKG 12-Lead  . EKG 12-Lead  . EKG 12-Lead  . EKG 12-Lead  . EKG 12-Lead    ASSESSMENT AND PLAN:  Active Problems:   Acute respiratory failure with hypoxia (HCC)  #1. Acute respiratory failure with hypoxia, due to pneumonia, urinary tract infection and CHF, continue oxygen therapy, weaning off oxygen as tolerated #2. Bacterial pneumonia, continue antibiotic therapy, get sputum cultures, adjust antibiotics depending on culture results, blood cultures are negative so far, get MRSA PCR #3. Acute on chronic diastolic CHF due to moderately severe aortic stenosis, initiate patient on diuretics, since  blood pressure is better.  #4. Septic  shock due to pneumonia, improved clinically, blood pressure has improved with therapy,  follow with diuresis #5. Hyponatremia, follow with diuresis #6. Elevated troponin, likely demand ischemia, no significant interventions #7. Urinary tract infection, get urine cultures, continue antibiotic therapy  Management plans discussed with the patient, family and they are in agreement.   DRUG ALLERGIES:  Allergies  Allergen Reactions  . Codeine Other (See Comments)    Reaction:  Dizziness   . Liraglutide     Other reaction(s): nausea and stomach pain    CODE STATUS:     Code Status Orders        Start     Ordered   06/30/16 2237  Full code  Continuous     06/30/16 2236    Code Status History    Date Active Date Inactive Code Status Order ID Comments User Context   06/23/2016  1:12 AM 06/25/2016  2:20 PM Full Code [id number]  [facility], DO Inpatient   05/31/2016  1:26 AM 06/02/2016  8:27 PM Full Code [id number]  Delfino Lovett, MD Inpatient   12/10/2015  4:55 PM 12/14/2015  9:13 PM Full Code [id number]  Milagros Loll, MD ED   03/13/2015  7:03 PM 03/15/2015  6:55 PM Full Code [id number]  Altamese Dilling, MD Inpatient   07/24/2014  5:53 PM 07/25/2014  6:55 PM Full Code [id number]  Annice Needy, MD Inpatient    Advance Directive Documentation     Most Recent Value  Type of Advance Directive  Healthcare Power of Attorney, Living will  Pre-existing out of facility DNR order (yellow form or pink MOST form)  -  "MOST" Form in Place?  -      TOTAL TIME TAKING CARE OF THIS PATIENT: 40 minutes.    Katharina Caper M.D on 07/01/2016 at 2:46 PM  Between 7am to 6pm - Pager - [phone]  After 6pm go to [email or web address] - password [email or web address] [facility]  [facility]  Office  [phone]  CC: Primary care physician; Rennie Plowman, FNP

## 2016-07-01 NOTE — Telephone Encounter (Signed)
FYI

## 2016-07-01 NOTE — Progress Notes (Signed)
PT Cancellation Note  Patient Details Name: [name] MRN: [id number] DOB: [birth year]   Cancelled Treatment:    Reason Eval/Treat Not Completed: Other (comment). Consult received and chart reviewed. Pt with high glucose noted this date trending from 451 down to 392 most recently. Pt is not appropriate for PT at this time. Will hold and re-attempt next date.   [place] 07/01/2016, 1:41 PM  Elizabeth Palau, PT, DPT [phone]

## 2016-07-01 NOTE — Progress Notes (Addendum)
Name: [name] MRN: [id number] DOB: 08-28-1945    ADMISSION DATE:  06/30/2016 CONSULTATION DATE:  06/30/16  REFERRING MD : Dr. Imogene Burn  CHIEF COMPLAINT:  Shortness of breath  BRIEF PATIENT DESCRIPTION:  71 yo female admitted with acute on chronic hypoxic respiratory failure secondary to ?LLL Pneumonia vs. Atelectasis and septic shock with hypotension secondary to UTI and ?LLL Pneumonia   SIGNIFICANT EVENTS  4/26/-4/28-Admitted to [facility] treated for PNA, discharged on 04/28 05/3-Pt admitted to [facility]   STUDIES:  05/31/16 ECHO>>Left ventricle: Wall thickness was increased in a pattern of mild  LVH. Systolic function was normal. The estimated ejection  fraction was in the range of 60% to 65%  REVIEW OF SYSTEMS:   Constitutional: Negative for fever, chills, weight loss, malaise/fatigue and diaphoresis.  HENT: Negative for hearing loss, ear pain, nosebleeds, congestion, sore throat, neck pain, tinnitus and ear discharge.   Eyes: Negative for blurred vision, double vision, photophobia, pain, discharge and redness.  Respiratory: Negative for cough, hemoptysis, sputum production, occasional  shortness of breath, wheezing and stridor.   Cardiovascular: Negative for chest pain, palpitations, orthopnea, claudication, leg swelling and PND.  Gastrointestinal: Negative for heartburn, nausea, vomiting, abdominal pain, diarrhea, constipation, blood in stool and melena.  Genitourinary: Negative for dysuria, urgency, frequency, hematuria and flank pain.  Musculoskeletal: Negative for myalgias, back pain, joint pain and falls.  Skin: Negative for itching and rash.  Neurological: Negative for dizziness, tingling, tremors, sensory change, speech change, focal weakness, seizures, loss of consciousness, weakness and headaches.  Endo/Heme/Allergies: Negative for environmental allergies and polydipsia. Does not bruise/bleed easily.  SUBJECTIVE:  Pt states she would like something for frequent  nonproductive cough  VITAL SIGNS: Temp:  [98 F (36.7 C)-98.6 F (37 C)] 98 F (36.7 C) (05/04 0800) Pulse Rate:  [65-99] 81 (05/04 0900) Resp:  [13-25] 16 (05/04 0900) BP: (68-148)/(26-103) 115/103 (05/04 0900) SpO2:  [91 %-98 %] 94 % (05/04 0900) Weight:  [96.9 kg (213 lb 10 oz)-99.8 kg (220 lb)] 96.9 kg (213 lb 10 oz) (05/03 2300)  PHYSICAL EXAMINATION: General:  Elderly ,Caucasian female, on [place], in no acute distress Neuro:  Awake, alert, oriented, follows commands  HEENT:  Supple, no JVD Cardiovascular: nsr, s1s2, no M/R/G Lungs: diminished throughout, even, non labored Abdomen:  Obese,round, active bowel sounds, non distended, non tender Musculoskeletal:  No edema or cyanosis noted Skin:  Large area of skin break down right groin, scattered ecchymosis    Recent Labs Lab 06/24/16 1036 06/30/16 1720 07/01/16 0333  NA 133* 132* 133*  K 4.3 5.0 5.0  CL 97* 95* 98*  CO2 29 30 30   BUN 29* 11 12  CREATININE 0.99 0.73 0.70  GLUCOSE 250* 314* 451*    Recent Labs Lab 06/30/16 1720 07/01/16 0333  HGB 9.7* 9.1*  HCT 29.9* 28.3*  WBC 7.0 6.2  PLT 152 128*   Ct Angio Chest Pe W Or Wo Contrast  Result Date: 06/30/2016 CLINICAL DATA:  Pneumonia shortness of breath EXAM: CT ANGIOGRAPHY CHEST WITH CONTRAST TECHNIQUE: Multidetector CT imaging of the chest was performed using the standard protocol during bolus administration of intravenous contrast. Multiplanar CT image reconstructions and MIPs were obtained to evaluate the vascular anatomy. CONTRAST:  75 mL Isovue 370 intravenous COMPARISON:  Chest x-ray 06/30/2016, CT chest 12/21/2013 FINDINGS: Cardiovascular: Respiratory motion artifact limits the exam. No definite central filling defects are visualized. Non aneurysmal aorta. Aortic atherosclerosis. No dissection. Coronary artery calcification. Mild cardiomegaly. No large pericardial effusion. Mediastinum/Nodes: Punctate  calcifications in the left lobe of the thyroid. Midline  trachea. No significantly enlarged mediastinal lymph nodes. Esophagus within normal limits. Lungs/Pleura: Small left-sided pleural effusion with left lower lobe partial atelectasis or pneumonia. No pneumothorax. Mild hazy bilateral densities may reflect diffuse atelectasis or mild edema. Upper Abdomen: Small amount of ascites around the liver and spleen. Spleen upper normal at 14 cm. Musculoskeletal: Degenerative changes. No suspicious bone lesion. Multiple old right lower anterior rib fractures. Review of the MIP images confirms the above findings. IMPRESSION: 1. Respiratory motion artifact limits the exam. No definite acute embolus visualized. Negative for aortic dissection 2. Small left pleural effusion with left lower lobe partial atelectasis or pneumonia 3. Small amount of ascites around the liver and spleen. Electronically Signed   By: Jasmine Pang M.D.   On: 06/30/2016 22:15   Dg Chest Port 1 View  Result Date: 06/30/2016 CLINICAL DATA:  Increasing shortness of breath, acute hypoxia. Recent diagnosis of pneumonia. History of CHF, hypertension, diabetes. EXAM: PORTABLE CHEST 1 VIEW COMPARISON:  Chest radiograph June 24, 2016 FINDINGS: Cardiac silhouette is mildly enlarged and unchanged. Mild pulmonary vascular congestion. Improved aeration LEFT lung base with LEFT costophrenic angle blunting and strandy densities. No pneumothorax. Old RIGHT rib fractures. Large body habitus. Osteopenia. IMPRESSION: Similar cardiomegaly and pulmonary vascular congestion. Small LEFT pleural effusion and LEFT lung base atelectasis versus pneumonia. Electronically Signed   By: Awilda Metro M.D.   On: 06/30/2016 18:35    ASSESSMENT / PLAN: Acute on chronic respiratory failure secondary to COPD exacerbation/?Left lower lobe PNA vs atelectasis  Small left pleural effussion Septic shock with hypotension secondary to UTI and ?LLL Pneumonia  IgA bullous dermatitis Mildly elevated troponins likely secondary to demand  Ischemia Hyponatremia Hx: Diabetes Mellitus, Hyperlipidemia, and Chronic Diastolic Heart Failure   Plan Supplemental O2 to maintain O2 sats 88% to 92% Continue bronchodilator therapy and prednisone Pulmonary hygiene  Maintain map >60 CBG's ac/hs, SSI, and lantus Continue outpatient dapsone Hold outpatient methotrexate for now   Continue abx for now  Follow cultures Trend WBC and PCT's and monitor fever curve Continue pravachol and aspirin Hold torsemide, ramipril, and HCTZ Trend troponin Repeat CXR in am  Physical therapy consult  -Will transfer pt out of ICU today 05/4 once bed becomes available PCCM team will sign off if you need further assistance please call on call pager.  Sonda Rumble, AGNP  Pulmonary/Critical Care Pager [phone] (please enter 7 digits) PCCM Consult Pager [phone] (please enter 7 digits)  PCCM ATTENDING ATTESTATION: I have evaluated patient with the APP [place], reviewed database in its entirety and discussed care plan in detail. In addition, this patient was discussed on multidisciplinary rounds. Agree with above findings, assessment and plan  PCCM will sign off. Please call if we can be of further assistance  Billy Fischer, MD PCCM service Mobile [phone] Pager [phone] 07/01/2016 1:14 PM

## 2016-07-01 NOTE — Progress Notes (Signed)
Report called to 1C Hermine Messick after patient informed opf move

## 2016-07-01 NOTE — Progress Notes (Signed)
Inpatient Diabetes Program Recommendations  AACE/ADA: New Consensus Statement on Inpatient Glycemic Control (2015)  Target Ranges:  Prepandial:   less than 140 mg/dL      Peak postprandial:   less than 180 mg/dL (1-2 hours)      Critically ill patients:  140 - 180 mg/dL   Lab Results  Component Value Date   GLUCAP 392 (H) 07/01/2016   HGBA1C 5.5 05/30/2016    Review of Glycemic ControlResults for KALYA, TROEGER (MRN [id number]) as of 07/01/2016 12:05  Ref. Range 06/30/2016 22:35 07/01/2016 07:19 07/01/2016 07:21 07/01/2016 11:47  Glucose-Capillary Latest Ref Range: 65 - 99 mg/dL 381 (H) 017 (H) 510 (H) 392 (H)   Diabetes history: Diabetes Mellitus Outpatient Diabetes medications: Lantus 20 units q HS, Novolog 10 units tid with meals Current orders for Inpatient glycemic control:  Novolog resistant tid with meals and HS, Lantus 20 units daily  Inpatient Diabetes Program Recommendations:    May consider adding Novolog meal coverage 5 units tid with meals (hold if patient eats less than 50%).  Thanks, Beryl Meager, RN, BC-ADM Inpatient Diabetes Coordinator Pager [phone] (8a-5p)

## 2016-07-02 ENCOUNTER — Inpatient Hospital Stay: Payer: Medicare Other

## 2016-07-02 LAB — CBC WITH DIFFERENTIAL/PLATELET
BASOS ABS: 0 10*3/uL (ref 0–0.1)
BASOS PCT: 0 %
EOS ABS: 0 10*3/uL (ref 0–0.7)
Eosinophils Relative: 0 %
HEMATOCRIT: 27 % — AB (ref 35.0–47.0)
HEMOGLOBIN: 8.5 g/dL — AB (ref 12.0–16.0)
Lymphocytes Relative: 13 %
Lymphs Abs: 1.2 10*3/uL (ref 1.0–3.6)
MCH: 30 pg (ref 26.0–34.0)
MCHC: 31.4 g/dL — ABNORMAL LOW (ref 32.0–36.0)
MCV: 95.7 fL (ref 80.0–100.0)
MONOS PCT: 6 %
Monocytes Absolute: 0.5 10*3/uL (ref 0.2–0.9)
NEUTROS ABS: 7.3 10*3/uL — AB (ref 1.4–6.5)
NEUTROS PCT: 81 %
Platelets: 119 10*3/uL — ABNORMAL LOW (ref 150–440)
RBC: 2.83 MIL/uL — AB (ref 3.80–5.20)
RDW: 17.3 % — ABNORMAL HIGH (ref 11.5–14.5)
WBC: 9 10*3/uL (ref 3.6–11.0)

## 2016-07-02 LAB — URINE CULTURE

## 2016-07-02 LAB — BASIC METABOLIC PANEL
ANION GAP: 3 — AB (ref 5–15)
BUN: 14 mg/dL (ref 6–20)
CALCIUM: 8.5 mg/dL — AB (ref 8.9–10.3)
CHLORIDE: 96 mmol/L — AB (ref 101–111)
CO2: 31 mmol/L (ref 22–32)
Creatinine, Ser: 0.65 mg/dL (ref 0.44–1.00)
GLUCOSE: 218 mg/dL — AB (ref 65–99)
POTASSIUM: 4 mmol/L (ref 3.5–5.1)
SODIUM: 130 mmol/L — AB (ref 135–145)

## 2016-07-02 LAB — GLUCOSE, CAPILLARY
GLUCOSE-CAPILLARY: 170 mg/dL — AB (ref 65–99)
GLUCOSE-CAPILLARY: 196 mg/dL — AB (ref 65–99)
GLUCOSE-CAPILLARY: 251 mg/dL — AB (ref 65–99)
Glucose-Capillary: 244 mg/dL — ABNORMAL HIGH (ref 65–99)

## 2016-07-02 LAB — PROCALCITONIN: PROCALCITONIN: 0.28 ng/mL

## 2016-07-02 MED ORDER — FUROSEMIDE 10 MG/ML IJ SOLN
20.0000 mg | Freq: Two times a day (BID) | INTRAMUSCULAR | Status: DC
  Administered 2016-07-02 – 2016-07-04 (×5): 20 mg via INTRAVENOUS
  Filled 2016-07-02 (×6): qty 2

## 2016-07-02 MED ORDER — ALBUTEROL SULFATE (2.5 MG/3ML) 0.083% IN NEBU
2.5000 mg | INHALATION_SOLUTION | Freq: Four times a day (QID) | RESPIRATORY_TRACT | Status: DC
  Administered 2016-07-02 – 2016-07-05 (×13): 2.5 mg via RESPIRATORY_TRACT
  Filled 2016-07-02 (×13): qty 3

## 2016-07-02 NOTE — Evaluation (Signed)
Physical Therapy Evaluation Patient Details Name: [name] MRN: [id number] DOB: 26-Aug-1945 Today's Date: 07/02/2016   History of Present Illness  [name] is a 70yo white person identifying as female who comes to [facility] after continued SOB after recent return to home after having PNA. PMH: COPD, CHF, CVA, PVD, HTN, DM. At baseline pt lives alone with 24/7 assistance, and limited household distances only.   Clinical Impression  Pt admitted with above diagnosis. Pt currently with functional limitations due to the deficits listed below (see "PT Problem List"). Upon entry, the patient is received semirecumbent in bed, no family/caregiver present. The pt is awake and agreeable to participate. No acute distress noted at this time. The pt is alert and oriented x3, pleasant, conversational. Pt received on and remaining on 4L O2 throughout evaluation, with noted saturation of 87-90%, whereas pt is not on O2 at baseline at home. Functional mobility assessment demonstrates moderate weakness, the pt now requiring moderate effort for bed mobility and transfers- gait is deferred due to desaturation after tranfers, whereas the patient reports he strength to be near baseline. Pt will benefit from skilled PT intervention to increase independence and safety with basic mobility in preparation for discharge to the venue listed below.       Follow Up Recommendations Home health PT    Equipment Recommendations  None recommended by PT    Recommendations for Other Services       Precautions / Restrictions Precautions Precautions: Fall Restrictions Weight Bearing Restrictions: No      Mobility  Bed Mobility Overal bed mobility: Needs Assistance Bed Mobility: Supine to Sit     Supine to sit: Min assist     General bed mobility comments: elevated HOB, single HHA.   Transfers Overall transfer level: Needs assistance Equipment used: Rolling walker (2 wheeled) Transfers: Sit to/from [employer] Sit to Stand: Min guard Stand pivot transfers: Min guard          Ambulation/Gait Ambulation/Gait assistance:  (deferred at this time due to hypoxia. )              Stairs            Wheelchair Mobility    Modified Rankin (Stroke Patients Only)       Balance Overall balance assessment: No apparent balance deficits (not formally assessed);Modified Independent                                           Pertinent Vitals/Pain Pain Assessment: No/denies pain    Home Living Family/patient expects to be discharged to:: Private residence Living Arrangements: Non-relatives/Friends Available Help at Discharge: Personal care attendant;Available 24 hours/day Type of Home: House Home Access: Ramped entrance     Home Layout: One level Home Equipment: Walker - 4 wheels;Wheelchair - manual;Transport chair;Hospital bed;Shower seat      Prior Function Level of Independence: Needs assistance   Gait / Transfers Assistance Needed: Mobility with rollator or WC and SBA limited household distances; Associate Professor for community distances  ADL's / Homemaking Assistance Needed: Requires modA for ADL.         Hand Dominance   Dominant Hand: Right    Extremity/Trunk Assessment   Upper Extremity Assessment Upper Extremity Assessment: Generalized weakness;Overall [facility] for tasks assessed    Lower Extremity Assessment Lower Extremity Assessment: Generalized weakness;Overall [facility] for tasks assessed  Communication   Communication: No difficulties  Cognition Arousal/Alertness: Awake/alert Behavior During Therapy: WFL for tasks assessed/performed Overall Cognitive Status: Within Functional Limits for tasks assessed                                        General Comments      Exercises     Assessment/Plan    PT Assessment Patient needs continued PT services  PT Problem List Decreased strength;Decreased  activity tolerance;Decreased balance;Decreased mobility;Cardiopulmonary status limiting activity       PT Treatment Interventions Gait training;Functional mobility training;Balance training;Therapeutic exercise;Therapeutic activities;Patient/family education    PT Goals (Current goals can be found in the Care Plan section)  Acute Rehab PT Goals Patient Stated Goal: regain ability to breath without supplemental O2 PT Goal Formulation: With patient Time For Goal Achievement: 07/16/16 Potential to Achieve Goals: Fair    Frequency Min 2X/week   Barriers to discharge        Co-evaluation               AM-PAC PT "6 Clicks" Daily Activity  Outcome Measure Difficulty turning over in bed (including adjusting bedclothes, sheets and blankets)?: A Little Difficulty moving from lying on back to sitting on the side of the bed? : A Little Difficulty sitting down on and standing up from a chair with arms (e.g., wheelchair, bedside commode, etc,.)?: A Little Help needed moving to and from a bed to chair (including a wheelchair)?: A Little Help needed walking in hospital room?: A Little Help needed climbing 3-5 steps with a railing? : A Lot 6 Click Score: 17    End of Session Equipment Utilized During Treatment: Oxygen Activity Tolerance: Patient limited by fatigue;Treatment limited secondary to medical complications (Comment) (hypoxia) Patient left: in chair;with call bell/phone within reach;with chair alarm set Nurse Communication: Mobility status PT Visit Diagnosis: Muscle weakness (generalized) (M62.81);Other abnormalities of gait and mobility (R26.89)    Time: 1194-1740 PT Time Calculation (min) (ACUTE ONLY): 24 min   Charges:   PT Evaluation $PT Eval Moderate Complexity: 1 Procedure PT Treatments $Therapeutic Activity: 8-22 mins   PT G Codes:        12:31 PM, 07-30-16 Rosamaria Lints, PT, DPT Physical Therapist - [place] [phone] (ASCOM)  [phone] (mobile)    Tiaja Hagan C 2016/07/30, 12:28 PM

## 2016-07-02 NOTE — Progress Notes (Signed)
[facility] - [place] at [facility]   PATIENT NAME: [name]    MR#:  [id number]  DATE OF BIRTH:  12-May-1945  SUBJECTIVE:  CHIEF COMPLAINT:   Chief Complaint  Patient presents with  . Code Sepsis  Patient continues to be short of breath complaints of congestion and cough   Review of Systems  Respiratory: Positive for cough, sputum production and shortness of breath.     VITAL SIGNS: Blood pressure 128/80, pulse (!) 104, temperature 98.4 F (36.9 C), temperature source Oral, resp. rate 19, height 5\' 2"  (1.575 m), weight 213 lb 10 oz (96.9 kg), SpO2 (!) 87 %.  PHYSICAL EXAMINATION:   GENERAL:  71 y.o.-year-old patient lying in the bed in mild to moderate respiratory distress, tachypnea, intermittently coughing, and uncomfortable laying down.  EYES: Pupils equal, round, reactive to light and accommodation. No scleral icterus. Extraocular muscles intact.  HEENT: Head atraumatic, normocephalic. Oropharynx and nasopharynx clear.  NECK:  Supple, positive for jugular venous distention. No thyroid enlargement, no tenderness.  LUNGS: Normal breath sounds bilaterally, no wheezing, rales,rhonchi or crepitation, diminished at bases, mostly on the left. Intermittent use of accessory muscles of respiration, especially with dry cough.  CARDIOVASCULAR: S1, S2 , rhythm is regular,. 2/6 systolic murmurs with her at precordially, distant sounds, no rubs, or gallops.  ABDOMEN: Soft, nontender, nondistended. Bowel sounds present. No organomegaly or mass.  EXTREMITIES: 1+ lower extremity and pedal edema, no cyanosis, or clubbing.  NEUROLOGIC: Cranial nerves II through XII are intact. Muscle strength 5/5 in all extremities. Sensation intact. Gait not checked.  PSYCHIATRIC: The patient is alert and oriented x 3.  SKIN: No obvious rash, lesion, or ulcer.   ORDERS/RESULTS REVIEWED:   CBC  Recent Labs Lab 06/30/16 1720 07/01/16 0333 07/02/16 0405  WBC 7.0 6.2 9.0    HGB 9.7* 9.1* 8.5*  HCT 29.9* 28.3* 27.0*  PLT 152 128* 119*  MCV 95.6 97.6 95.7  MCH 31.1 31.4 30.0  MCHC 32.5 32.2 31.4*  RDW 17.7* 17.5* 17.3*  LYMPHSABS 0.4*  --  1.2  MONOABS 0.4  --  0.5  EOSABS 0.0  --  0.0  BASOSABS 0.0  --  0.0   ------------------------------------------------------------------------------------------------------------------  Chemistries   Recent Labs Lab 06/30/16 1720 06/30/16 1736 07/01/16 0333 07/02/16 0405  NA 132*  --  133* 130*  K 5.0  --  5.0 4.0  CL 95*  --  98* 96*  CO2 30  --  30 31  GLUCOSE 314*  --  451* 218*  BUN 11  --  12 14  CREATININE 0.73  --  0.70 0.65  CALCIUM 9.0  --  8.5* 8.5*  MG  --  2.5*  --   --   AST 74*  --   --   --   ALT 53  --   --   --   ALKPHOS 156*  --   --   --   BILITOT 1.4*  --   --   --    ------------------------------------------------------------------------------------------------------------------ estimated creatinine clearance is 71.1 mL/min (by C-G formula based on SCr of 0.65 mg/dL). ------------------------------------------------------------------------------------------------------------------ No results for input(s): TSH, T4TOTAL, T3FREE, THYROIDAB in the last 72 hours.  Invalid input(s): FREET3  Cardiac Enzymes  Recent Labs Lab 06/30/16 1720 07/01/16 0333  TROPONINI 0.04* 0.03*   ------------------------------------------------------------------------------------------------------------------ Invalid input(s): POCBNP ---------------------------------------------------------------------------------------------------------------  RADIOLOGY: Ct Angio Chest Pe W Or Wo Contrast  Result Date: 06/30/2016 CLINICAL DATA:  Pneumonia shortness of breath EXAM:  CT ANGIOGRAPHY CHEST WITH CONTRAST TECHNIQUE: Multidetector CT imaging of the chest was performed using the standard protocol during bolus administration of intravenous contrast. Multiplanar CT image reconstructions and MIPs were obtained  to evaluate the vascular anatomy. CONTRAST:  75 mL Isovue 370 intravenous COMPARISON:  Chest x-ray 06/30/2016, CT chest 12/21/2013 FINDINGS: Cardiovascular: Respiratory motion artifact limits the exam. No definite central filling defects are visualized. Non aneurysmal aorta. Aortic atherosclerosis. No dissection. Coronary artery calcification. Mild cardiomegaly. No large pericardial effusion. Mediastinum/Nodes: Punctate calcifications in the left lobe of the thyroid. Midline trachea. No significantly enlarged mediastinal lymph nodes. Esophagus within normal limits. Lungs/Pleura: Small left-sided pleural effusion with left lower lobe partial atelectasis or pneumonia. No pneumothorax. Mild hazy bilateral densities may reflect diffuse atelectasis or mild edema. Upper Abdomen: Small amount of ascites around the liver and spleen. Spleen upper normal at 14 cm. Musculoskeletal: Degenerative changes. No suspicious bone lesion. Multiple old right lower anterior rib fractures. Review of the MIP images confirms the above findings. IMPRESSION: 1. Respiratory motion artifact limits the exam. No definite acute embolus visualized. Negative for aortic dissection 2. Small left pleural effusion with left lower lobe partial atelectasis or pneumonia 3. Small amount of ascites around the liver and spleen. Electronically Signed   By: Jasmine Pang M.D.   On: 06/30/2016 22:15   Dg Chest Port 1 View  Result Date: 07/02/2016 CLINICAL DATA:  71 year old female with respiratory failure. Cirrhosis. Left pleural effusion and left lower lobe atelectasis versus pneumonia. EXAM: PORTABLE CHEST 1 VIEW COMPARISON:  Chest CTA 06/30/2016 and earlier. FINDINGS: Portable AP upright view at 0427 hours. Stable lung volumes. Stable cardiac size and mediastinal contours. Calcified aortic atherosclerosis. Visualized tracheal air column is within normal limits. The right lung is stable. Continued left lung base veiling and streaky opacity, stable since  06/30/2016. No areas of worsening ventilation. IMPRESSION: 1. Stable small left pleural effusion and left lung base atelectasis versus infection. 2. No new cardiopulmonary abnormality. 3.  Calcified aortic atherosclerosis. Electronically Signed   By: Odessa Fleming M.D.   On: 07/02/2016 07:00   Dg Chest Port 1 View  Result Date: 06/30/2016 CLINICAL DATA:  Increasing shortness of breath, acute hypoxia. Recent diagnosis of pneumonia. History of CHF, hypertension, diabetes. EXAM: PORTABLE CHEST 1 VIEW COMPARISON:  Chest radiograph June 24, 2016 FINDINGS: Cardiac silhouette is mildly enlarged and unchanged. Mild pulmonary vascular congestion. Improved aeration LEFT lung base with LEFT costophrenic angle blunting and strandy densities. No pneumothorax. Old RIGHT rib fractures. Large body habitus. Osteopenia. IMPRESSION: Similar cardiomegaly and pulmonary vascular congestion. Small LEFT pleural effusion and LEFT lung base atelectasis versus pneumonia. Electronically Signed   By: Awilda Metro M.D.   On: 06/30/2016 18:35    EKG:  Orders placed or performed during the hospital encounter of 06/30/16  . ED EKG 12-Lead  . ED EKG 12-Lead  . EKG 12-Lead  . EKG 12-Lead  . EKG 12-Lead  . EKG 12-Lead  . EKG 12-Lead  . EKG 12-Lead  . EKG 12-Lead  . EKG 12-Lead    ASSESSMENT AND PLAN:  Active Problems:   Acute respiratory failure with hypoxia (HCC)  #1. Acute respiratory failure with hypoxia, due to pneumonia,And diastolic CHF Continue antibiotics and continue diuresis I will add incentive spirometry to current regimen #2. Bacterial pneumonia, continue antibiotic therapy,  #3. Acute on chronic diastolic CHF due to moderately severe aortic stenosis continue diuretics,  #4. Septic shock due to pneumonia, improved clinically, blood pressure has improved with therapy, follow  with diuresis #5. Hyponatremia, follow with diuresis #6. Elevated troponin, likely demand ischemia, no significant interventions #7.  Urinary tract infection, culture show yeast #8. Moderate to severe aortic stenosis patient will need outpatient cardiothoracic evaluation for further recommendations  Management plans discussed with the patient, family and they are in agreement.   DRUG ALLERGIES:  Allergies  Allergen Reactions  . Codeine Other (See Comments)    Reaction:  Dizziness   . Liraglutide     Other reaction(s): nausea and stomach pain    CODE STATUS:     Code Status Orders        Start     Ordered   06/30/16 2237  Full code  Continuous     06/30/16 2236    Code Status History    Date Active Date Inactive Code Status Order ID Comments User Context   06/23/2016  1:12 AM 06/25/2016  2:20 PM Full Code [id number]  [facility], DO Inpatient   05/31/2016  1:26 AM 06/02/2016  8:27 PM Full Code [id number]  Delfino Lovett, MD Inpatient   12/10/2015  4:55 PM 12/14/2015  9:13 PM Full Code [id number]  Milagros Loll, MD ED   03/13/2015  7:03 PM 03/15/2015  6:55 PM Full Code [id number]  Altamese Dilling, MD Inpatient   07/24/2014  5:53 PM 07/25/2014  6:55 PM Full Code [id number]  Annice Needy, MD Inpatient    Advance Directive Documentation     Most Recent Value  Type of Advance Directive  Healthcare Power of Attorney, Living will  Pre-existing out of facility DNR order (yellow form or pink MOST form)  -  "MOST" Form in Place?  -      TOTAL TIME TAKING CARE OF THIS PATIENT: 32  minutes.    Auburn Bilberry M.D on 07/02/2016 at 1:33 PM  Between 7am to 6pm - Pager - [phone]  After 6pm go to [email or web address] - password [email or web address] [facility]  [facility]  Office  [phone]  CC: Primary care physician; Allegra Grana, FNP

## 2016-07-03 LAB — BASIC METABOLIC PANEL
ANION GAP: 5 (ref 5–15)
BUN: 12 mg/dL (ref 6–20)
CALCIUM: 8.7 mg/dL — AB (ref 8.9–10.3)
CO2: 32 mmol/L (ref 22–32)
CREATININE: 0.68 mg/dL (ref 0.44–1.00)
Chloride: 95 mmol/L — ABNORMAL LOW (ref 101–111)
GLUCOSE: 203 mg/dL — AB (ref 65–99)
Potassium: 3.6 mmol/L (ref 3.5–5.1)
Sodium: 132 mmol/L — ABNORMAL LOW (ref 135–145)

## 2016-07-03 LAB — GLUCOSE, CAPILLARY
GLUCOSE-CAPILLARY: 239 mg/dL — AB (ref 65–99)
GLUCOSE-CAPILLARY: 193 mg/dL — AB (ref 65–99)
Glucose-Capillary: 178 mg/dL — ABNORMAL HIGH (ref 65–99)
Glucose-Capillary: 212 mg/dL — ABNORMAL HIGH (ref 65–99)

## 2016-07-03 NOTE — Progress Notes (Signed)
[facility] Physicians - [place] at [facility]   PATIENT NAME: [name]    MR#:  [id number]  DATE OF BIRTH:  [birth year]  SUBJECTIVE:  CHIEF COMPLAINT:   Chief Complaint  Patient presents with  . Code Sepsis  pt breathing improved   Review of Systems  Respiratory: Positive for cough, sputum production and shortness of breath.     VITAL SIGNS: Blood pressure (!) 132/56, pulse 94, temperature 98.4 F (36.9 C), temperature source Axillary, resp. rate 16, height 5\' 2"  (1.575 m), weight 213 lb 10 oz (96.9 kg), SpO2 90 %.  PHYSICAL EXAMINATION:   GENERAL:  71 y.o.-year-old patient lying in the bed in mild to moderate respiratory distress, tachypnea, intermittently coughing, and uncomfortable laying down.  EYES: Pupils equal, round, reactive to light and accommodation. No scleral icterus. Extraocular muscles intact.  HEENT: Head atraumatic, normocephalic. Oropharynx and nasopharynx clear.  NECK:  Supple, positive for jugular venous distention. No thyroid enlargement, no tenderness.  LUNGS: Normal breath sounds bilaterally, no wheezing, rales,rhonchi or crepitation, diminished at bases, mostly on the left. Intermittent use of accessory muscles of respiration, especially with dry cough.  CARDIOVASCULAR: S1, S2 , rhythm is regular,. 2/6 systolic murmurs with her at precordially, distant sounds, no rubs, or gallops.  ABDOMEN: Soft, nontender, nondistended. Bowel sounds present. No organomegaly or mass.  EXTREMITIES: 1+ lower extremity and pedal edema, no cyanosis, or clubbing.  NEUROLOGIC: Cranial nerves II through XII are intact. Muscle strength 5/5 in all extremities. Sensation intact. Gait not checked.  PSYCHIATRIC: The patient is alert and oriented x 3.  SKIN: No obvious rash, lesion, or ulcer.   ORDERS/RESULTS REVIEWED:   CBC  Recent Labs Lab 06/30/16 1720 07/01/16 0333 07/02/16 0405  WBC 7.0 6.2 9.0  HGB 9.7* 9.1* 8.5*  HCT 29.9* 28.3* 27.0*  PLT 152  128* 119*  MCV 95.6 97.6 95.7  MCH 31.1 31.4 30.0  MCHC 32.5 32.2 31.4*  RDW 17.7* 17.5* 17.3*  LYMPHSABS 0.4*  --  1.2  MONOABS 0.4  --  0.5  EOSABS 0.0  --  0.0  BASOSABS 0.0  --  0.0   ------------------------------------------------------------------------------------------------------------------  Chemistries   Recent Labs Lab 06/30/16 1720 06/30/16 1736 07/01/16 0333 07/02/16 0405 07/03/16 0334  NA 132*  --  133* 130* 132*  K 5.0  --  5.0 4.0 3.6  CL 95*  --  98* 96* 95*  CO2 30  --  30 31 32  GLUCOSE 314*  --  451* 218* 203*  BUN 11  --  12 14 12   CREATININE 0.73  --  0.70 0.65 0.68  CALCIUM 9.0  --  8.5* 8.5* 8.7*  MG  --  2.5*  --   --   --   AST 74*  --   --   --   --   ALT 53  --   --   --   --   ALKPHOS 156*  --   --   --   --   BILITOT 1.4*  --   --   --   --    ------------------------------------------------------------------------------------------------------------------ estimated creatinine clearance is 71.1 mL/min (by C-G formula based on SCr of 0.68 mg/dL). ------------------------------------------------------------------------------------------------------------------ No results for input(s): TSH, T4TOTAL, T3FREE, THYROIDAB in the last 72 hours.  Invalid input(s): FREET3  Cardiac Enzymes  Recent Labs Lab 06/30/16 1720 07/01/16 0333  TROPONINI 0.04* 0.03*   ------------------------------------------------------------------------------------------------------------------ Invalid input(s): POCBNP ---------------------------------------------------------------------------------------------------------------  RADIOLOGY: Dg Chest Port 1 [address]  Result Date: 07/02/2016 CLINICAL DATA:  71 year old female with respiratory failure. Cirrhosis. Left pleural effusion and left lower lobe atelectasis versus pneumonia. EXAM: PORTABLE CHEST 1 VIEW COMPARISON:  Chest CTA 06/30/2016 and earlier. FINDINGS: Portable AP upright view at 0427 hours. Stable lung  volumes. Stable cardiac size and mediastinal contours. Calcified aortic atherosclerosis. Visualized tracheal air column is within normal limits. The right lung is stable. Continued left lung base veiling and streaky opacity, stable since 06/30/2016. No areas of worsening ventilation. IMPRESSION: 1. Stable small left pleural effusion and left lung base atelectasis versus infection. 2. No new cardiopulmonary abnormality. 3.  Calcified aortic atherosclerosis. Electronically Signed   By: Odessa Fleming M.D.   On: 07/02/2016 07:00    EKG:  Orders placed or performed during the hospital encounter of 06/30/16  . ED EKG 12-Lead  . ED EKG 12-Lead  . EKG 12-Lead  . EKG 12-Lead  . EKG 12-Lead  . EKG 12-Lead  . EKG 12-Lead  . EKG 12-Lead  . EKG 12-Lead  . EKG 12-Lead    ASSESSMENT AND PLAN:  Active Problems:   Acute respiratory failure with hypoxia (HCC)  #1. Acute respiratory failure with hypoxia, due to pneumonia,And diastolic CHF Continue antibiotics and continue diuresis pt with some improvment #2. Bacterial pneumonia, continue antibiotic therapy,  #3. Acute on chronic diastolic CHF due to moderately severe aortic stenosis continue diuretics,  #4. Septic shock due to pneumonia, improved clinically,  #5. Hyponatremia, improved with diureisis #6. Elevated troponin, likely demand ischemia, no significant interventions #7. Urinary tract infection, culture show yeast #8. Moderate to severe aortic stenosis patient will need outpatient cardiothoracic evaluation for further recommendations  Management plans discussed with the patient, family and they are in agreement.   DRUG ALLERGIES:  Allergies  Allergen Reactions  . Codeine Other (See Comments)    Reaction:  Dizziness   . Liraglutide     Other reaction(s): nausea and stomach pain    CODE STATUS:     Code Status Orders        Start     Ordered   06/30/16 2237  Full code  Continuous     06/30/16 2236    Code Status History    Date  Active Date Inactive Code Status Order ID Comments User Context   06/23/2016  1:12 AM 06/25/2016  2:20 PM Full Code [id number]  [facility], DO Inpatient   05/31/2016  1:26 AM 06/02/2016  8:27 PM Full Code [id number]  Delfino Lovett, MD Inpatient   12/10/2015  4:55 PM 12/14/2015  9:13 PM Full Code [id number]  Milagros Loll, MD ED   03/13/2015  7:03 PM 03/15/2015  6:55 PM Full Code [id number]  Altamese Dilling, MD Inpatient   07/24/2014  5:53 PM 07/25/2014  6:55 PM Full Code [id number]  Annice Needy, MD Inpatient    Advance Directive Documentation     Most Recent Value  Type of Advance Directive  Healthcare Power of Attorney, Living will  Pre-existing out of facility DNR order (yellow form or pink MOST form)  -  "MOST" Form in Place?  -      TOTAL TIME TAKING CARE OF THIS PATIENT: 32  minutes.    Auburn Bilberry M.D on 07/03/2016 at 12:45 PM  Between 7am to 6pm - Pager - [phone]  After 6pm go to [email or web address] - password [email or web address]  [facility]  Office  [phone]  CC: Primary care physician; Allegra Grana, FNP

## 2016-07-03 NOTE — Progress Notes (Signed)
Advanced care plan.  Purpose of the Encounter: CODE STATUS  Parties in Attendance: Patient her self  Patient's Decision Capacity:Intact  Subjective/Patient's story: Patient is a 71 year old white female with history of 3 admissions within the past 6 months who is readmitted with shortness of breath due to pneumonia and CHF. She is also has moderate to severe aortic stenosis    Objective/Medical story I discussed with the patient regarding her moderate to severe aortic stenosis, I explained to her regarding CODE STATUS CPR and resuscitation. Asked for her wishes to be a DO NOT RESUSCITATE oral full code. Patient wishes to be a full code   Goals of care determination: Remains full code    CODE STATUS: Full code   Time spent discussing advanced care planning: 15 minutes

## 2016-07-04 LAB — GLUCOSE, CAPILLARY
Glucose-Capillary: 118 mg/dL — ABNORMAL HIGH (ref 65–99)
Glucose-Capillary: 248 mg/dL — ABNORMAL HIGH (ref 65–99)
Glucose-Capillary: 377 mg/dL — ABNORMAL HIGH (ref 65–99)
Glucose-Capillary: 245 mg/dL — ABNORMAL HIGH (ref 65–99)

## 2016-07-04 LAB — BASIC METABOLIC PANEL
Anion gap: 5 (ref 5–15)
BUN: 10 mg/dL (ref 6–20)
CO2: 35 mmol/L — AB (ref 22–32)
Calcium: 8.5 mg/dL — ABNORMAL LOW (ref 8.9–10.3)
Chloride: 93 mmol/L — ABNORMAL LOW (ref 101–111)
Creatinine, Ser: 0.83 mg/dL (ref 0.44–1.00)
GFR calc Af Amer: >60 mL/min (ref >60)
GLUCOSE: 146 mg/dL — AB (ref 65–99)
Potassium: 3.8 mmol/L (ref 3.5–5.1)
Sodium: 133 mmol/L — ABNORMAL LOW (ref 135–145)

## 2016-07-04 MED ORDER — ACETAMINOPHEN 325 MG PO TABS
650.0000 mg | ORAL_TABLET | Freq: Four times a day (QID) | ORAL | Status: DC | PRN
  Administered 2016-07-04: 650 mg via ORAL
  Filled 2016-07-04: qty 2

## 2016-07-04 NOTE — Progress Notes (Signed)
Shift assessment completed. Pt is awake, alert and oriented, resting in bed. o2 on at [facility], pt has expiratory wheezes throughout lung fields, denied sob at this time. Pt denied pain. Hr is regular, murmur noted. Abdomen is soft, bs heard. Pt has bruising to the top of bilat forearms, piv is intact to r fa, site has bruising under dressing. Lelbow area has green dressing wrapped around it, some bleeding from posterior elbow through dressing is noted as well. Blood on dressing has dried. Pt has interdry intact under abdominal fold,ppp, some general edema noted. Pt is wearing incontinence brief. Call bell in reach.

## 2016-07-04 NOTE — Progress Notes (Signed)
Pt remains alert and oriented. No complaints of pain this shift. Incontinent of urine. Remaining on 2L of oxygen.

## 2016-07-04 NOTE — Progress Notes (Signed)
Physical Therapy Treatment Patient Details Name: [name] MRN: [id number] DOB: 04/26/1945 Today's Date: 07/04/2016    History of Present Illness [name] is a 70yo white person identifying as female who comes to [facility] after continued SOB after recent return to home after having PNA. PMH: COPD, CHF, CVA, PVD, HTN, DM. At baseline pt lives alone with 24/7 assistance, and limited household distances only.     PT Comments    Pt agreeable to PT; complains of HA, which nursing provided medication for during session. Pt resting comfortably in chair with O2 saturation on 2 liters O2 at 86%; denies SOB,but notes feeling "sleepy". Nursing called and instructed increasing O2 to 3 or 4 liters as needed. Pt O2 saturation improved only to 87-90% with 4 liters. Pt participates in seated exercises with increased rest times. O2 saturation remains mostly 88-89% throughout. Pt participates in stand tolerance for 25 and 40 seconds with increase in heart rate from 93 beats per minute to 109 and 114 beats respectively. Pt limited by weakness with stand; upper extremity shaking and weakness lower extremities with mildly uncontrolled descent. Pt instructed in and encouraged to perform incentive spirometer 10x every hour. Continue PT to progress strength with improved O2 saturation to improve all functional mobility. May need to consider skilled nursing facility placement if pt unable to tolerate ambulation. Re assess discharge recommendation next visit.   Follow Up Recommendations  Home health PT     Equipment Recommendations  None recommended by PT    Recommendations for Other Services       Precautions / Restrictions Precautions Precautions: Fall Restrictions Weight Bearing Restrictions: No    Mobility  Bed Mobility               General bed mobility comments: Not tested; up in chair  Transfers Overall transfer level: Needs assistance Equipment used: Rolling walker (2  wheeled) Transfers: Sit to/from Stand Sit to Stand: Min guard         General transfer comment: Cues for safe hand placement  Ambulation/Gait                 Stairs            Wheelchair Mobility    Modified Rankin (Stroke Patients Only)       Balance Overall balance assessment: Needs assistance Sitting-balance support: Bilateral upper extremity supported;Feet supported Sitting balance-Leahy Scale: Fair     Standing balance support: Bilateral upper extremity supported Standing balance-Leahy Scale: Fair                              Cognition Arousal/Alertness: Awake/alert Behavior During Therapy: WFL for tasks assessed/performed Overall Cognitive Status: Within Functional Limits for tasks assessed                                        Exercises General Exercises - Lower Extremity Long Arc Quad: AROM;Both;20 reps;Seated Hip ABduction/ADduction: AROM;Both;20 reps;Seated;Strengthening Hip Flexion/Marching: AROM;Both;20 reps;Seated Toe Raises: AROM;Both;20 reps;Seated Heel Raises: AROM;Both;20 reps;Seated Other Exercises Other Exercises: stand tolerance x 2; HR increases to 109 then 114 bpm respectively. Tolerance 25 then 40 seconds    General Comments        Pertinent Vitals/Pain Pain Assessment:  (Complains of HA; nsg gave meds during session)    Home Living  Prior Function            PT Goals (current goals can now be found in the care plan section) Progress towards PT goals: Progressing toward goals (slowly)    Frequency    Min 2X/week      PT Plan Current plan remains appropriate    Co-evaluation              AM-PAC PT "6 Clicks" Daily Activity  Outcome Measure  Difficulty turning over in bed (including adjusting bedclothes, sheets and blankets)?: A Little Difficulty moving from lying on back to sitting on the side of the bed? : A Little Difficulty sitting down  on and standing up from a chair with arms (e.g., wheelchair, bedside commode, etc,.)?: A Little Help needed moving to and from a bed to chair (including a wheelchair)?: A Little Help needed walking in hospital room?: A Little Help needed climbing 3-5 steps with a railing? : A Lot 6 Click Score: 17    End of Session Equipment Utilized During Treatment: Oxygen (Increased per nursing) Activity Tolerance: Patient limited by fatigue Patient left: in chair;with call bell/phone within reach;with chair alarm set Nurse Communication: Other (comment) (O2 saturation levels) PT Visit Diagnosis: Muscle weakness (generalized) (M62.81);Other abnormalities of gait and mobility (R26.89)     Time: 4098-1191 PT Time Calculation (min) (ACUTE ONLY): 29 min  Charges:  $Therapeutic Exercise: 8-22 mins $Therapeutic Activity: 8-22 mins                    G Codes:        Scot Dock, PTA 07/04/2016, 11:38 AM

## 2016-07-04 NOTE — Plan of Care (Signed)
Problem: Bowel/Gastric: Goal: Will not experience complications related to bowel motility Outcome: Progressing Pt is progressing toward goals, continues to perform at minimum standard with PT.

## 2016-07-04 NOTE — Progress Notes (Signed)
Pt has caregiver at bedside, remains alert and on O2. Pt with no complaints.

## 2016-07-04 NOTE — Progress Notes (Signed)
[facility] Physicians - [place] at [facility]   PATIENT NAME: [name]    MR#:  [id number]  DATE OF BIRTH:  [birth year]  SUBJECTIVE:  CHIEF COMPLAINT:   Chief Complaint  Patient presents with  . Code Sepsis  pt breathing improved   Review of Systems  Constitutional: Negative.  Negative for chills, diaphoresis, fever, malaise/fatigue and weight loss.  HENT: Negative for ear discharge, ear pain, hearing loss and tinnitus.   Eyes: Negative for blurred vision, double vision, photophobia and pain.  Respiratory: Positive for cough, sputum production and shortness of breath.   Cardiovascular: Negative for chest pain, palpitations and orthopnea.  Gastrointestinal: Negative for abdominal pain, heartburn, nausea and vomiting.  Genitourinary: Negative for dysuria, frequency and urgency.  Musculoskeletal: Negative for myalgias and neck pain.  Skin: Negative for itching and rash.  Neurological: Negative for dizziness, tingling, tremors, sensory change, weakness and headaches.  Endo/Heme/Allergies: Negative for environmental allergies. Does not bruise/bleed easily.  Psychiatric/Behavioral: Negative for depression, hallucinations, substance abuse and suicidal ideas.    VITAL SIGNS: Blood pressure (!) 135/56, pulse 93, temperature 97.9 F (36.6 C), temperature source Oral, resp. rate 18, height 5\' 2"  (1.575 m), weight 213 lb 10 oz (96.9 kg), SpO2 (!) 86 %.  PHYSICAL EXAMINATION:   GENERAL:  71 y.o.-year-old patient lying in the bed  EYES: Pupils equal, round, reactive to light and accommodation. No scleral icterus. Extraocular muscles intact.  HEENT: Head atraumatic, normocephalic. Oropharynx and nasopharynx clear.  NECK:  Supple, positive for jugular venous distention. No thyroid enlargement, no tenderness.  LUNGS: Normal breath sounds bilaterally, no wheezing, rales,rhonchi or crepitation, diminished at bases, mostly on the left. Intermittent use of accessory muscles  of respiration, especially with dry cough.  CARDIOVASCULAR: S1, S2 , rhythm is regular,. 2/6 systolic murmurs with her at precordially, distant sounds, no rubs, or gallops.  ABDOMEN: Soft, nontender, nondistended. Bowel sounds present. No organomegaly or mass.  EXTREMITIES: 1+ lower extremity and pedal edema, no cyanosis, or clubbing.  NEUROLOGIC: Cranial nerves II through XII are intact. Muscle strength 5/5 in all extremities. Sensation intact. Gait not checked.  PSYCHIATRIC: The patient is alert and oriented x 3.  SKIN: No obvious rash, lesion, or ulcer.   ORDERS/RESULTS REVIEWED:   CBC  Recent Labs Lab 06/30/16 1720 07/01/16 0333 07/02/16 0405  WBC 7.0 6.2 9.0  HGB 9.7* 9.1* 8.5*  HCT 29.9* 28.3* 27.0*  PLT 152 128* 119*  MCV 95.6 97.6 95.7  MCH 31.1 31.4 30.0  MCHC 32.5 32.2 31.4*  RDW 17.7* 17.5* 17.3*  LYMPHSABS 0.4*  --  1.2  MONOABS 0.4  --  0.5  EOSABS 0.0  --  0.0  BASOSABS 0.0  --  0.0   ------------------------------------------------------------------------------------------------------------------  Chemistries   Recent Labs Lab 06/30/16 1720 06/30/16 1736 07/01/16 0333 07/02/16 0405 07/03/16 0334 07/04/16 0401  NA 132*  --  133* 130* 132* 133*  K 5.0  --  5.0 4.0 3.6 3.8  CL 95*  --  98* 96* 95* 93*  CO2 30  --  30 31 32 35*  GLUCOSE 314*  --  451* 218* 203* 146*  BUN 11  --  12 14 12 10   CREATININE 0.73  --  0.70 0.65 0.68 0.83  CALCIUM 9.0  --  8.5* 8.5* 8.7* 8.5*  MG  --  2.5*  --   --   --   --   AST 74*  --   --   --   --   --  ALT 53  --   --   --   --   --   ALKPHOS 156*  --   --   --   --   --   BILITOT 1.4*  --   --   --   --   --    ------------------------------------------------------------------------------------------------------------------ estimated creatinine clearance is 68.5 mL/min (by C-G formula based on SCr of 0.83  mg/dL). ------------------------------------------------------------------------------------------------------------------ No results for input(s): TSH, T4TOTAL, T3FREE, THYROIDAB in the last 72 hours.  Invalid input(s): FREET3  Cardiac Enzymes  Recent Labs Lab 06/30/16 1720 07/01/16 0333  TROPONINI 0.04* 0.03*   ------------------------------------------------------------------------------------------------------------------ Invalid input(s): POCBNP ---------------------------------------------------------------------------------------------------------------  RADIOLOGY: No results found.  EKG:  Orders placed or performed during the hospital encounter of 06/30/16  . ED EKG 12-Lead  . ED EKG 12-Lead  . EKG 12-Lead  . EKG 12-Lead  . EKG 12-Lead  . EKG 12-Lead  . EKG 12-Lead  . EKG 12-Lead  . EKG 12-Lead  . EKG 12-Lead    ASSESSMENT AND PLAN:  Active Problems:   Acute respiratory failure with hypoxia (HCC)  #1. Acute respiratory failure with hypoxia, due to pneumonia,And diastolic CHF Continue antibiotics and continue diuresis Patient continues to show improvement #2. Bacterial pneumonia, continue antibiotic therapy,  #3. Acute on chronic diastolic CHF due to moderately severe aortic stenosis continue diuretics,  #4. Septic shock due to pneumonia, improved clinically,  #5. Hyponatremia, improved with diureisis #6. Elevated troponin, likely demand ischemia, no significant interventions #7. Urinary tract infection, culture show yeast #8. Moderate to severe aortic stenosis patient will need outpatient cardiothoracic evaluation for further recommendations  Management plans discussed with the patient, family and they are in agreement.   DRUG ALLERGIES:  Allergies  Allergen Reactions  . Codeine Other (See Comments)    Reaction:  Dizziness   . Liraglutide     Other reaction(s): nausea and stomach pain    CODE STATUS:     Code Status Orders        Start      Ordered   06/30/16 2237  Full code  Continuous     06/30/16 2236    Code Status History    Date Active Date Inactive Code Status Order ID Comments User Context   06/23/2016  1:12 AM 06/25/2016  2:20 PM Full Code [id number]  [facility], DO Inpatient   05/31/2016  1:26 AM 06/02/2016  8:27 PM Full Code [id number]  Delfino Lovett, MD Inpatient   12/10/2015  4:55 PM 12/14/2015  9:13 PM Full Code [id number]  Milagros Loll, MD ED   03/13/2015  7:03 PM 03/15/2015  6:55 PM Full Code [id number]  Altamese Dilling, MD Inpatient   07/24/2014  5:53 PM 07/25/2014  6:55 PM Full Code [id number]  Annice Needy, MD Inpatient    Advance Directive Documentation     Most Recent Value  Type of Advance Directive  Healthcare Power of Attorney, Living will  Pre-existing out of facility DNR order (yellow form or pink MOST form)  -  "MOST" Form in Place?  -      TOTAL TIME TAKING CARE OF THIS PATIENT: 25  minutes.    Auburn Bilberry M.D on 07/04/2016 at 3:01 PM  Between 7am to 6pm - Pager - [phone]  After 6pm go to [email or web address] - password [email or web address] [facility]  [facility]  Office  [phone]  CC: Primary care physician; Allegra Grana, FNP

## 2016-07-04 NOTE — Progress Notes (Signed)
CH responded to a consult for AD. CH met with Pt. Pt stated she already has an AD at home and on file, and did not need to complete another one. Pt requested prayers for healing instead, which [place] provided with and ministry of emphatic presence.    07/04/16 1200  Clinical Encounter Type  Visited With Patient  Visit Type Other (Comment)  Referral From Nurse  Consult/Referral To Chaplain  Spiritual Encounters  Spiritual Needs Prayer;Other (Comment)  Stress Factors  Patient Stress Factors None identified  Family Stress Factors None identified  Advance Directives (For Healthcare)  Does Patient Have a Medical Advance Directive? Yes  Does patient want to make changes to medical advance directive? No - Patient declined  Would patient like information on creating a medical advance directive? No - Patient declined  [place]  Does Patient Have a Mental Health Advance Directive? No  Would patient like information on creating a mental health advance directive? No - Patient declined

## 2016-07-04 NOTE — Progress Notes (Signed)
Inpatient Diabetes Program Recommendations  AACE/ADA: New Consensus Statement on Inpatient Glycemic Control (2015)  Target Ranges:  Prepandial:   less than 140 mg/dL      Peak postprandial:   less than 180 mg/dL (1-2 hours)      Critically ill patients:  140 - 180 mg/dL   Results for [name], [name] (MRN [id number]) as of 07/04/2016 12:50  Ref. Range 07/03/2016 07:30 07/03/2016 11:05 07/03/2016 16:14 07/03/2016 21:00  Glucose-Capillary Latest Ref Range: 65 - 99 mg/dL 329 (H) 518 (H) 841 (H) 193 (H)   Results for [name], [name] (MRN [id number]) as of 07/04/2016 12:50  Ref. Range 07/04/2016 07:23 07/04/2016 12:16  Glucose-Capillary Latest Ref Range: 65 - 99 mg/dL 109 (H) 323 (H)    Home DM Meds: Lantus 20 units QHS       Novolog 10 units TID  Current Insulin Orders: Lantus 20 units daily      Novolog Resistant Correction Scale/ SSI (0-20 units) TID AC + HS     MD- Patient having issues with elevated post-prandial CBGs.  Eating 100% of meals.  Please consider starting 50% home dose of Novolog for patient in-hospital:   Novolog 5 units TID with meals (hold if pt eats <50% of meal)      --Will follow patient during hospitalization--  Ambrose Finland RN, MSN, CDE Diabetes Coordinator Inpatient Glycemic Control Team Team Pager: [phone] (8a-5p)

## 2016-07-05 ENCOUNTER — Telehealth: Payer: Self-pay | Admitting: *Deleted

## 2016-07-05 DIAGNOSIS — Z7189 Other specified counseling: Secondary | ICD-10-CM

## 2016-07-05 DIAGNOSIS — Z515 Encounter for palliative care: Secondary | ICD-10-CM

## 2016-07-05 DIAGNOSIS — A419 Sepsis, unspecified organism: Principal | ICD-10-CM

## 2016-07-05 LAB — CBC
HEMATOCRIT: 27 % — AB (ref 35.0–47.0)
Hemoglobin: 8.8 g/dL — ABNORMAL LOW (ref 12.0–16.0)
MCH: 31 pg (ref 26.0–34.0)
MCHC: 32.6 g/dL (ref 32.0–36.0)
MCV: 95.1 fL (ref 80.0–100.0)
Platelets: 123 10*3/uL — ABNORMAL LOW (ref 150–440)
RBC: 2.84 MIL/uL — AB (ref 3.80–5.20)
RDW: 19 % — ABNORMAL HIGH (ref 11.5–14.5)
WBC: 8.3 10*3/uL (ref 3.6–11.0)

## 2016-07-05 LAB — GLUCOSE, CAPILLARY
Glucose-Capillary: 134 mg/dL — ABNORMAL HIGH (ref 65–99)
Glucose-Capillary: 206 mg/dL — ABNORMAL HIGH (ref 65–99)

## 2016-07-05 LAB — CULTURE, BLOOD (ROUTINE X 2)
CULTURE: NO GROWTH
CULTURE: NO GROWTH
SPECIAL REQUESTS: ADEQUATE
Special Requests: ADEQUATE

## 2016-07-05 MED ORDER — TORSEMIDE 20 MG PO TABS: 40.0000 mg | ORAL_TABLET | Freq: Two times a day (BID) | ORAL | 3 refills | Status: DC

## 2016-07-05 MED ORDER — BENZONATATE 100 MG PO CAPS: 100.0000 mg | ORAL_CAPSULE | Freq: Three times a day (TID) | ORAL | 0 refills | Status: AC | PRN

## 2016-07-05 MED ORDER — ALBUTEROL SULFATE (2.5 MG/3ML) 0.083% IN NEBU: 2.5000 mg | INHALATION_SOLUTION | RESPIRATORY_TRACT | 12 refills | Status: AC | PRN

## 2016-07-05 MED ORDER — FUROSEMIDE 40 MG PO TABS
40.0000 mg | ORAL_TABLET | Freq: Two times a day (BID) | ORAL | Status: DC
  Administered 2016-07-05: 40 mg via ORAL
  Filled 2016-07-05: qty 1

## 2016-07-05 MED ORDER — SILVASORB EX GEL: 1.0000 "application " | Freq: Two times a day (BID) | CUTANEOUS | 6 refills | Status: AC | PRN

## 2016-07-05 MED ORDER — CEPHALEXIN 500 MG PO CAPS: 500.0000 mg | ORAL_CAPSULE | Freq: Two times a day (BID) | ORAL | 0 refills | Status: AC

## 2016-07-05 NOTE — Discharge Summary (Signed)
[facility] - [place] at [facility], 71 y.o., DOB 22-Jan-1946, MRN [id number]. Admission date: 06/30/2016 Discharge Date 07/05/2016 Primary MD Burnard Hawthorne, FNP Admitting Physician Demetrios Loll, MD  Admission Diagnosis  Hypoxia [R09.02] Severe aortic stenosis [I35.0] Acute respiratory failure with hypoxia ([place]) [J96.01] Sepsis, due to unspecified organism [facility]) [A41.9]  Discharge Diagnosis   Active Problems:   Acute respiratory failure with hypoxia ([place])  Septic shock due to pneumonia  Acute on chronic diastolic CHF Moderate to severe aortic stenosis Hyponatremia Elevated troponin Abnormal UA with culture showing yeast Restless leg syndrome Peripheral vascular disease GERD Previous history of CVA COPD/asthma        Hospital Course  Patient is 71 year old Caucasian female with past medical history significant for history of moderate to severe aortic stenosis, normal ejection fraction on echocardiogram in April 2018, COPD, CHF, stroke, peripheral vascular disease, hypertension, diabetes, who was recently admitted for pneumonia, and discharged about 5 days ago, who presents to the hospital with cough, wheezes, shortness of breath, fever, chills and generalized weakness. She was noted to be hypoxic, was initiated on BiPAP and admitted to intensive care unit. She was found to be relatively hypotensive with systolic blood pressure in 70s over 40s initially, requiring vasopressin, Levophed infusion. Patient was initiated on antibiotic therapy, including vancomycin, levofloxacin,  steroids, nebulizers, she feels somewhat more comfortable now. CT angiogram of the chest revealed left lower lobe pneumonia, left pleural effusion, ascites. Patient was in the ICU. She was treated with antibiotics and diuresis. She was slow to improve. Patient does have moderate to severe aortic stenosis and will need to follow-up with cardiology may need referral to cardiothoracic  surgeon because a lot of this may be related to that.           Consults  intesivist  Significant Tests:  See full reports for all details     Dg Chest 2 View  Result Date: 06/24/2016 CLINICAL DATA:  Pneumonia, shortness of breath with weakness EXAM: CHEST  2 VIEW COMPARISON:  06/22/2016, 05/30/2016 FINDINGS: Cardiomegaly. Trace bilateral pleural effusions. Slightly improved aeration of the left lung base. Streaky bibasilar opacities. Cardiomegaly with central vascular congestion. Aortic atherosclerosis. No pneumothorax. IMPRESSION: 1. Tiny bilateral effusions. Overall improved aeration at the left lung base 2. Streaky bibasilar opacities, favor atelectasis 3. Cardiomegaly with central vascular congestion Electronically Signed   By: Donavan Foil M.D.   On: 06/24/2016 18:38   Ct Angio Chest Pe W Or Wo Contrast  Result Date: 06/30/2016 CLINICAL DATA:  Pneumonia shortness of breath EXAM: CT ANGIOGRAPHY CHEST WITH CONTRAST TECHNIQUE: Multidetector CT imaging of the chest was performed using the standard protocol during bolus administration of intravenous contrast. Multiplanar CT image reconstructions and MIPs were obtained to evaluate the vascular anatomy. CONTRAST:  75 mL Isovue 370 intravenous COMPARISON:  Chest x-ray 06/30/2016, CT chest 12/21/2013 FINDINGS: Cardiovascular: Respiratory motion artifact limits the exam. No definite central filling defects are visualized. Non aneurysmal aorta. Aortic atherosclerosis. No dissection. Coronary artery calcification. Mild cardiomegaly. No large pericardial effusion. Mediastinum/Nodes: Punctate calcifications in the left lobe of the thyroid. Midline trachea. No significantly enlarged mediastinal lymph nodes. Esophagus within normal limits. Lungs/Pleura: Small left-sided pleural effusion with left lower lobe partial atelectasis or pneumonia. No pneumothorax. Mild hazy bilateral densities may reflect diffuse atelectasis or mild edema. Upper Abdomen: Small  amount of ascites around the liver and spleen. Spleen upper normal at 14 cm. Musculoskeletal: Degenerative changes. No suspicious bone lesion. Multiple old right  lower anterior rib fractures. Review of the MIP images confirms the above findings. IMPRESSION: 1. Respiratory motion artifact limits the exam. No definite acute embolus visualized. Negative for aortic dissection 2. Small left pleural effusion with left lower lobe partial atelectasis or pneumonia 3. Small amount of ascites around the liver and spleen. Electronically Signed   By: Donavan Foil M.D.   On: 06/30/2016 22:15   Dg Chest Port 1 View  Result Date: 07/02/2016 CLINICAL DATA:  71 year old female with respiratory failure. Cirrhosis. Left pleural effusion and left lower lobe atelectasis versus pneumonia. EXAM: PORTABLE CHEST 1 VIEW COMPARISON:  Chest CTA 06/30/2016 and earlier. FINDINGS: Portable AP upright view at 0427 hours. Stable lung volumes. Stable cardiac size and mediastinal contours. Calcified aortic atherosclerosis. Visualized tracheal air column is within normal limits. The right lung is stable. Continued left lung base veiling and streaky opacity, stable since 06/30/2016. No areas of worsening ventilation. IMPRESSION: 1. Stable small left pleural effusion and left lung base atelectasis versus infection. 2. No new cardiopulmonary abnormality. 3.  Calcified aortic atherosclerosis. Electronically Signed   By: Genevie Ann M.D.   On: 07/02/2016 07:00   Dg Chest Port 1 View  Result Date: 06/30/2016 CLINICAL DATA:  Increasing shortness of breath, acute hypoxia. Recent diagnosis of pneumonia. History of CHF, hypertension, diabetes. EXAM: PORTABLE CHEST 1 VIEW COMPARISON:  Chest radiograph June 24, 2016 FINDINGS: Cardiac silhouette is mildly enlarged and unchanged. Mild pulmonary vascular congestion. Improved aeration LEFT lung base with LEFT costophrenic angle blunting and strandy densities. No pneumothorax. Old RIGHT rib fractures. Large body  habitus. Osteopenia. IMPRESSION: Similar cardiomegaly and pulmonary vascular congestion. Small LEFT pleural effusion and LEFT lung base atelectasis versus pneumonia. Electronically Signed   By: Elon Alas M.D.   On: 06/30/2016 18:35   Dg Chest Portable 1 View  Result Date: 06/22/2016 CLINICAL DATA:  Acute onset of generalized weakness. Low blood pressure. Initial encounter. EXAM: PORTABLE CHEST 1 VIEW COMPARISON:  Chest radiograph performed 05/30/2016 FINDINGS: The lungs are mildly hypoexpanded. A small left pleural effusion is noted. Left basilar airspace opacity raises concern for pneumonia. Mild right basilar atelectasis is seen. No pneumothorax is seen. The cardiomediastinal silhouette is within normal limits. No acute osseous abnormalities are seen. IMPRESSION: Lungs mildly hypoexpanded. Small left pleural effusion noted. Left basilar airspace opacity raises concern for pneumonia. Mild right basilar atelectasis seen. Followup PA and lateral chest X-ray is recommended in 3-4 weeks following trial of antibiotic therapy to ensure resolution and exclude underlying malignancy. Electronically Signed   By: Garald Balding M.D.   On: 06/22/2016 23:06   US Abdomen Limited Ruq  Addendum Date: 06/27/2016   ADDENDUM REPORT: 06/27/2016 10:01 ADDENDUM: Additional note, the gallbladder wall measured thick at 7 mm, but some of this is related to pericholecystic fat based on prior abdominal CT. Gallbladder wall is closer to 3-4 mm in thickness, which can be attributed to incomplete distention or reactive changes in this patient with negative Murphy sign. Electronically Signed   By: Monte Fantasia M.D.   On: 06/27/2016 10:01   Result Date: 06/27/2016 CLINICAL DATA:  Abdominal pain EXAM: US ABDOMEN LIMITED - RIGHT UPPER QUADRANT COMPARISON:  CT 03/09/2016 FINDINGS: Gallbladder: Calcified gallstone at the neck. Although shadowing is difficult to detect, stone is calcified by CT. No wall thickening or focal  tenderness. Common bile duct: Diameter: 5 mm.  Where visualized, no filling defect. Liver: Cirrhotic liver morphology. No mass lesion is seen. Antegrade flow in the imaged hepatic and portal  venous system. Trace peritoneal fluid. IMPRESSION: 1. Cholelithiasis without evidence of cholecystitis. 2. Cirrhosis. Electronically Signed: By: Monte Fantasia M.D. On: 06/23/2016 10:03       Today   Subjective:   [name]   Pt feeling better sob improved  Objective:   Blood pressure (!) 110/45, pulse 82, temperature 98 F (36.7 C), temperature source Oral, resp. rate 18, height _0  (1.575 m), weight 213 lb 10 oz (96.9 kg), SpO2 (!) 89 %.  .  Intake/Output Summary (Last 24 hours) at 07/05/16 1446 Last data filed at 07/05/16 1349  Gross per 24 hour  Intake              720 ml  Output                0 ml  Net              720 ml    Exam VITAL SIGNS: Blood pressure (!) 110/45, pulse 82, temperature 98 F (36.7 C), temperature source Oral, resp. rate 18, height _1  (1.575 m), weight 213 lb 10 oz (96.9 kg), SpO2 (!) 89 %.  GENERAL:  71 y.o.-year-old patient lying in the bed with no acute distress.  EYES: Pupils equal, round, reactive to light and accommodation. No scleral icterus. Extraocular muscles intact.  HEENT: Head atraumatic, normocephalic. Oropharynx and nasopharynx clear.  NECK:  Supple, no jugular venous distention. No thyroid enlargement, no tenderness.  LUNGS: clear to ausculation bilatral CARDIOVASCULAR: S1, S2 normal. + syslolic murmurs, rubs, or gallops.  ABDOMEN: Soft, nontender, nondistended. Bowel sounds present. No organomegaly or mass.  EXTREMITIES: No pedal edema, cyanosis, or clubbing.  NEUROLOGIC: Cranial nerves II through XII are intact. Muscle strength 5/5 in all extremities. Sensation intact. Gait not checked.  PSYCHIATRIC: The patient is alert and oriented x 3.  SKIN: No obvious rash, lesion, or ulcer.   Data Review     CBC w Diff: Lab Results   Component Value Date   WBC 8.3 07/05/2016   HGB 8.8 (L) 07/05/2016   HGB 12.5 12/19/2013   HCT 27.0 (L) 07/05/2016   HCT 40.6 12/19/2013   PLT 123 (L) 07/05/2016   PLT 205 12/19/2013   LYMPHOPCT 13 07/02/2016   BANDSPCT 2 05/30/2016   MONOPCT 6 07/02/2016   EOSPCT 0 07/02/2016   BASOPCT 0 07/02/2016   CMP: Lab Results  Component Value Date   NA 133 (L) 07/04/2016   NA 135 (A) 06/21/2016   NA 139 12/19/2013   K 3.8 07/04/2016   K 4.1 12/19/2013   CL 93 (L) 07/04/2016   CL 101 12/19/2013   CO2 35 (H) 07/04/2016   CO2 29 12/19/2013   BUN 10 07/04/2016   BUN 23 (A) 06/21/2016   BUN 9 12/19/2013   CREATININE 0.83 07/04/2016   CREATININE 0.73 12/19/2013   GLU 198 06/21/2016   PROT 6.2 (L) 06/30/2016   ALBUMIN 3.1 (L) 06/30/2016   BILITOT 1.4 (H) 06/30/2016   ALKPHOS 156 (H) 06/30/2016   AST 74 (H) 06/30/2016   ALT 53 06/30/2016  .  Micro Results Recent Results (from the past 240 hour(s))  Blood Culture (routine x 2)     Status: None   Collection Time: 06/30/16  5:20 PM  Result Value Ref Range Status   Specimen Description BLOOD R FOREARM  Final   Special Requests Blood Culture adequate volume  Final   Culture NO GROWTH 5 DAYS  Final   Report Status 07/05/2016 FINAL  Final  Urine culture     Status: Abnormal   Collection Time: 06/30/16  5:20 PM  Result Value Ref Range Status   Specimen Description URINE, RANDOM  Final   Special Requests NONE  Final   Culture >=100,000 COLONIES/mL YEAST (A)  Final   Report Status 07/02/2016 FINAL  Final  Blood Culture (routine x 2)     Status: None   Collection Time: 06/30/16  5:36 PM  Result Value Ref Range Status   Specimen Description BLOOD L ARM  Final   Special Requests Blood Culture adequate volume  Final   Culture NO GROWTH 5 DAYS  Final   Report Status 07/05/2016 FINAL  Final  MRSA PCR Screening     Status: None   Collection Time: 07/01/16  3:34 PM  Result Value Ref Range Status   MRSA by PCR NEGATIVE NEGATIVE Final     Comment:        The GeneXpert MRSA Assay (FDA approved for NASAL specimens only), is one component of a comprehensive MRSA colonization surveillance program. It is not intended to diagnose MRSA infection nor to guide or monitor treatment for MRSA infections.         Code Status Orders        Start     Ordered   06/30/16 2237  Full code  Continuous     06/30/16 2236    Code Status History    Date Active Date Inactive Code Status Order ID Comments User Context   06/23/2016  1:12 AM 06/25/2016  2:20 PM Full Code [id number]  HugelmeyerUbaldo Glassing, DO Inpatient   05/31/2016  1:26 AM 06/02/2016  8:27 PM Full Code [id number]  Max Sane, MD Inpatient   12/10/2015  4:55 PM 12/14/2015  9:13 PM Full Code [id number]  Hillary Bow, MD ED   03/13/2015  7:03 PM 03/15/2015  6:55 PM Full Code [id number]  [name], MD Inpatient   07/24/2014  5:53 PM 07/25/2014  6:55 PM Full Code [id number]  Algernon Huxley, MD Inpatient    Advance Directive Documentation     Most Recent Value  Type of Advance Directive  Healthcare Power of Attorney, Living will  Pre-existing out of facility DNR order (yellow form or pink MOST form)  -  "MOST" Form in Place?  -          Follow-up Information    Burnard Hawthorne, FNP Follow up in 1 week(s).   Specialty:  Family Medicine Contact information: [place] [address] [place] [address] [phone]        Yolonda Kida, MD Follow up in 1 week(s).   Specialties:  Cardiology, Internal Medicine Why:  severe to moderate aortic stenosis Contact information: [place] [address] [phone]           Discharge Medications   Allergies as of 07/05/2016      Reactions   Codeine Other (See Comments)   Reaction:  Dizziness    Liraglutide    Other reaction(s): nausea and stomach pain      Medication List    TAKE these medications   acetaminophen 325 MG tablet Commonly  known as:  TYLENOL Take 2 tablets (650 mg total) by mouth every 6 (six) hours as needed for mild pain (or Fever >/= 101).   albuterol (2.5 MG/3ML) 0.083% nebulizer solution Commonly known as:  PROVENTIL Take 3 mLs (2.5 mg total) by nebulization every 4 (four) hours  as needed for wheezing.   aspirin EC 81 MG tablet Take 81 mg by mouth daily.   B-12 1000 MCG/ML Kit Inject 1,000 mcg as directed every 30 (thirty) days.   benzonatate 100 MG capsule Commonly known as:  TESSALON Take 1 capsule (100 mg total) by mouth 3 (three) times daily as needed for cough.   cephALEXin 500 MG capsule Commonly known as:  KEFLEX Take 1 capsule (500 mg total) by mouth every 12 (twelve) hours.   Cholecalciferol 1000 units capsule Take 1,000 Units by mouth daily.   dapsone 100 MG tablet Take 150 mg by mouth daily.   feeding supplement (PRO-STAT SUGAR FREE 64) Liqd Take 30 mLs by mouth. 30 ml daily for 30 days   ferrous sulfate 325 (65 FE) MG tablet Take 1 tablet (325 mg total) by mouth daily with breakfast.   folic acid 1 MG tablet Commonly known as:  FOLVITE Take 1 mg by mouth daily. Take one pill daily on Tuesday, Wednesday, Thursday, Friday, Saturday and Sunday. These are days were patient isn't taking methotrexate   gabapentin 800 MG tablet Commonly known as:  NEURONTIN Take 800 mg by mouth 2 (two) times daily.   hydrochlorothiazide 12.5 MG tablet Commonly known as:  HYDRODIURIL Take 12.5 mg by mouth 2 (two) times daily.   insulin aspart 100 UNIT/ML injection Commonly known as:  novoLOG Inject 10 Units into the skin 3 (three) times daily before meals. 301-350+ 10 units, 351 or higher NOTIFY MD   insulin glargine 100 UNIT/ML injection Commonly known as:  LANTUS Inject 0.2 mLs (20 Units total) into the skin at bedtime.   lovastatin 20 MG tablet Commonly known as:  MEVACOR Take 20 mg by mouth at bedtime.   magnesium oxide 400 (241.3 Mg) MG tablet Commonly known as:  MAG-OX Take 1  tablet (400 mg total) by mouth 2 (two) times daily.   methotrexate 2.5 MG tablet Commonly known as:  RHEUMATREX Take 10 mg by mouth once a week. Patient takes on Monday. Caution:Chemotherapy. Protect from light.   MULTIVITAL tablet Take 1 tablet by mouth daily.   omeprazole 20 MG capsule Commonly known as:  PRILOSEC Take 1 capsule (20 mg total) by mouth at bedtime.   potassium chloride SA 20 MEQ tablet Commonly known as:  K-DUR,KLOR-CON Take 2 tablets (40 mEq total) by mouth 2 (two) times daily.   predniSONE 10 MG tablet Commonly known as:  DELTASONE Take 10 mg by mouth daily with breakfast. Until seen by dermatologist   ramipril 2.5 MG capsule Commonly known as:  ALTACE Take 1 capsule (2.5 mg total) by mouth daily.   SILVASORB Gel Apply 1 application topically 2 (two) times daily as needed.   torsemide 20 MG tablet Commonly known as:  DEMADEX Take 2 tablets (40 mg total) by mouth 2 (two) times daily.            Durable Medical Equipment        Start     Ordered   07/05/16 1304  For home use only DME Nebulizer machine  Once    Question:  Patient needs a nebulizer to treat with the following condition  Answer:  COPD (chronic obstructive pulmonary disease) ([place])   07/05/16 1303   07/05/16 1303  For home use only DME oxygen  Once    Question Answer Comment  Mode or (Route) Nasal cannula   Liters per Minute 2   Frequency Continuous (stationary and portable oxygen unit needed)   Oxygen  delivery system Gas      07/05/16 1302         Total Time in preparing paper work, data evaluation and todays exam - 35 minutes  Dustin Flock M.D on 07/05/2016 at 2:46 PM  [facility]   Office  [phone]

## 2016-07-05 NOTE — Telephone Encounter (Signed)
Patient will discharge from [facility] on 07/05/16. Patient has been scheduled for a HFU

## 2016-07-05 NOTE — Plan of Care (Signed)
Problem: Bowel/Gastric: Goal: Will not experience complications related to bowel motility Outcome: Adequate for Discharge Pt has met goals for discharge, will be dc'd with O2 and nebulizer machine.

## 2016-07-05 NOTE — Progress Notes (Addendum)
Inpatient Diabetes Program Recommendations  AACE/ADA: New Consensus Statement on Inpatient Glycemic Control (2015)  Target Ranges:  Prepandial:   less than 140 mg/dL      Peak postprandial:   less than 180 mg/dL (1-2 hours)      Critically ill patients:  140 - 180 mg/dL   Results for [name], [name] (MRN [id number]) as of 07/05/2016 10:40  Ref. Range 07/04/2016 07:23 07/04/2016 12:16 07/04/2016 16:33 07/04/2016 21:05 07/05/2016 07:17  Glucose-Capillary Latest Ref Range: 65 - 99 mg/dL 409 (H) 811 (H) 914 (H) 245 (H) 134 (H)   Review of Glycemic Control  Diabetes history: DM2 Outpatient Diabetes medications: Lantus 20 units QHS, Novolog 10 units TID with meals plus correction scale Current orders for Inpatient glycemic control: Lantus 20 units daily, Novolog 0-20 units TID with meals, Novolog 0-5 units QHS  Inpatient Diabetes Program Recommendations: Insulin - Meal Coverage: Noted post prandial glucose consistently elevated and patient received Prednisone 10 mg on 07/04/16 )which is ordered daily). Please consider ordering Novolog 5 units TID with meals for meal coverage (in addition to Novolog correction scale).  Thanks, [name], RN, MSN, CDE Diabetes Coordinator Inpatient Diabetes Program [phone] (Team Pager from 8am to 5pm)

## 2016-07-05 NOTE — Care Management Note (Addendum)
Case Management Note  Patient Details  Name: [name] MRN: [id number] Date of Birth: December 26, 1945  Subjective/Objective:    Will be HRI with [facility] for nursing, PT. Qualifies for home O2. Will need new home O2 and nebulizer. ( Patient still needs home O2 despite trying antibiotics and diuretics.). Will go home by car.  Patient updated and is in agreement of POC. Palliative care consult ordered by Lora Havens with [facility] team with [facility] to provide.   Action/Plan:   Expected Discharge Date:                  Expected Discharge Plan:     In-House Referral:  Clinical Social Work  Discharge planning Services  CM Consult  Post Acute Care Choice:  Durable Medical Equipment, Home Health Choice offered to:     DME Arranged:  Oxygen, Nebulizer machine DME Agency:  [facility]  HH Arranged:  RN, PT, Nurse's Aide, Social Work [employer] Agency:  [facility]  Status of Service:  Completed, signed off  If discussed at [employer], dates discussed:    Additional Comments:  [name], RN 07/05/2016, 2:14 PM

## 2016-07-05 NOTE — Discharge Instructions (Signed)
[facility] - [place] at [facility]  DIET:  Diabetic diet  DISCHARGE CONDITION:  Stable  ACTIVITY:  Activity as tolerated  OXYGEN:  Home Oxygen: Yes.     Oxygen Delivery: 2 liters/min via Patient connected to nasal cannula oxygen  DISCHARGE LOCATION:  home    ADDITIONAL DISCHARGE INSTRUCTION:   If you experience worsening of your admission symptoms, develop shortness of breath, life threatening emergency, suicidal or homicidal thoughts you must seek medical attention immediately by calling 911 or calling your MD immediately  if symptoms less severe.  You Must read complete instructions/literature along with all the possible adverse reactions/side effects for all the Medicines you take and that have been prescribed to you. Take any new Medicines after you have completely understood and accpet all the possible adverse reactions/side effects.   Please note  You were cared for by a hospitalist during your hospital stay. If you have any questions about your discharge medications or the care you received while you were in the hospital after you are discharged, you can call the unit and asked to speak with the hospitalist on call if the hospitalist that took care of you is not available. Once you are discharged, your primary care physician will handle any further medical issues. Please note that NO REFILLS for any discharge medications will be authorized once you are discharged, as it is imperative that you return to your primary care physician (or establish a relationship with a primary care physician if you do not have one) for your aftercare needs so that they can reassess your need for medications and monitor your lab values.

## 2016-07-05 NOTE — Progress Notes (Signed)
SATURATION QUALIFICATIONS: (This note is used to comply with regulatory documentation for home oxygen)  Patient Saturations on Room Air at Rest = 81%  Patient Saturations on Room Air while Ambulating = 80%  Patient Saturations on 2  Liters of oxygen while Ambulating = 91%  Please briefly explain why patient needs home oxygen:Pt has chronic CHF and aortic stenosis.

## 2016-07-05 NOTE — Consult Note (Signed)
Consultation Note Date: 07/05/2016   Patient Name: [name]  DOB: Mar 09, 1945  MRN: [id number]  Age / Sex: 71 y.o., female  PCP: Burnard Hawthorne, FNP Referring Physician: Dustin Flock, MD  Reason for Consultation: Establishing goals of care  HPI/Patient Profile: 71 y.o. female  with past medical history of COPD,  Pneumonia, CHF, CVA, PVD, HTN, DM admitted on 06/30/2016 with recurrent pneumonia, pleural effusion, sepsis requiring Bipap in the ICU. Palliative medicine consulted for [place].   Clinical Assessment and Goals of Care: Met with patient as she was preparing to be discharged.   Introduced palliative medicine. Palliative medicine is specialized medical care for people living with serious illness. It focuses on providing relief from the symptoms and stress of a serious illness. The goal is to improve quality of life for both the patient and the family.  Reviewed disease trajectory of chronic illnesses and possible outcomes. She lives at home independently with the assistance of her caregiver Mikle Bosworth, who was also present. Her spouse, died from COPD and she feels she has experience watching this disease progress. Her GOC for now are to return home and remain at home for as long as possible without an exacerbation, however, she is willing to return to the hospital in the event of another exacerbation.   Discussed code status in the event that she were to die. She would not want to be kept on a ventilator long term, however, she does request full code status for now. She recognizes that her illness is progressive and will worsen over time. She agrees to community palliative followup for continued GOC and symptom management.   Primary Decision Maker PATIENT    SUMMARY OF RECOMMENDATIONS -Community Palliative F/U    Code Status/Advance Care Planning:  Full code  Prognosis:    < 12 months d/t  adv lung disease with recurrent hospitalizations  Discharge Planning: Home with Palliative Services  Primary Diagnoses: Present on Admission: . Acute respiratory failure with hypoxia ([place])   I have reviewed the medical record, interviewed the patient and family, and examined the patient. The following aspects are pertinent.  Past Medical History:  Diagnosis Date  . Anemia   . Asthma   . CHF (congestive heart failure) ([place])   . CVA (cerebral vascular accident) ([place])   . Diabetes mellitus without complication ([place])   . GERD (gastroesophageal reflux disease)   . Heart murmur   . History of hiatal hernia   . Hypertension   . Panic attacks   . Peripheral vascular disease ([place])   . Restless leg syndrome   . Shortness of breath dyspnea    Social History   Social History  . Marital status: Widowed    Spouse name: N/A  . Number of children: N/A  . Years of education: N/A   Social History Main Topics  . Smoking status: Never Smoker  . Smokeless tobacco: Never Used  . Alcohol use No  . Drug use: No  . Sexual activity: No   Other Topics Concern  .  None   Social History Narrative   Admitted to [facility]  4/8/208   Retired- [employer]    2 children; boy and girl   3 grandchildren   Never smoked   Full Code      Family History  Problem Relation Age of Onset  . Pancreatic cancer Mother   . CAD Father   . Hypertension Father   . Pancreatic cancer Father   . Colon cancer Father   . CAD Brother   . Heart disease Brother     Heart attack  . Breast cancer Maternal Aunt     pt states several maternal aunts   Scheduled Meds: . albuterol  2.5 mg Nebulization Q6H  . aspirin EC  81 mg Oral Daily  . dapsone  150 mg Oral Daily  . enoxaparin (LOVENOX) injection  40 mg Subcutaneous Q24H  . ferrous sulfate  325 mg Oral Q breakfast  . folic acid  1 mg Oral Daily  . furosemide  40 mg Oral BID  . gabapentin  800 mg Oral BID  . insulin aspart  0-20 Units Subcutaneous TID WC  .  insulin aspart  0-5 Units Subcutaneous QHS  . insulin glargine  20 Units Subcutaneous Daily  . magnesium oxide  400 mg Oral BID  . pravastatin  20 mg Oral q1800  . predniSONE  10 mg Oral Q breakfast  . sodium chloride flush  3 mL Intravenous Q12H   Continuous Infusions: PRN Meds:.acetaminophen, benzonatate, chlorpheniramine-HYDROcodone, [DISCONTINUED] ondansetron **OR** ondansetron (ZOFRAN) IV, senna-docusate Medications Prior to Admission:  Prior to Admission medications   Medication Sig Start Date End Date Taking? Authorizing Provider  acetaminophen (TYLENOL) 325 MG tablet Take 2 tablets (650 mg total) by mouth every 6 (six) hours as needed for mild pain (or Fever >/= 101). 12/14/15  Yes Gouru, Illene Silver, MD  Amino Acids-Protein Hydrolys (FEEDING SUPPLEMENT, PRO-STAT SUGAR FREE 64,) LIQD Take 30 mLs by mouth. 30 ml daily for 30 days 06/16/16  Yes [provider]  aspirin EC 81 MG tablet Take 81 mg by mouth daily.   Yes [provider]  Cholecalciferol 1000 units capsule Take 1,000 Units by mouth daily.   Yes [provider]  Cyanocobalamin (B-12) 1000 MCG/ML KIT Inject 1,000 mcg as directed every 30 (thirty) days. 02/02/16  Yes [provider]  dapsone 100 MG tablet Take 150 mg by mouth daily.    Yes [provider]  ferrous sulfate 325 (65 FE) MG tablet Take 1 tablet (325 mg total) by mouth daily with breakfast. 11/03/15  Yes Arnett, Yvetta Coder, FNP  folic acid (FOLVITE) 1 MG tablet Take 1 mg by mouth daily. Take one pill daily on Tuesday, Wednesday, Thursday, Friday, Saturday and Sunday. These are days were patient isn't taking methotrexate   Yes [provider]  gabapentin (NEURONTIN) 800 MG tablet Take 800 mg by mouth 2 (two) times daily.   Yes [provider]  hydrochlorothiazide (HYDRODIURIL) 12.5 MG tablet Take 12.5 mg by mouth 2 (two) times daily.    Yes [provider]  insulin aspart (NOVOLOG) 100 UNIT/ML injection  Inject 10 Units into the skin 3 (three) times daily before meals. 301-350+ 10 units, 351 or higher NOTIFY MD   Yes [provider]  insulin glargine (LANTUS) 100 UNIT/ML injection Inject 0.2 mLs (20 Units total) into the skin at bedtime. 06/02/16  Yes Demetrios Loll, MD  lovastatin (MEVACOR) 20 MG tablet Take 20 mg by mouth at bedtime.  Yes [provider]  magnesium oxide (MAG-OX) 400 (241.3 Mg) MG tablet Take 1 tablet (400 mg total) by mouth 2 (two) times daily. 06/25/16  Yes Fritzi Mandes, MD  methotrexate (RHEUMATREX) 2.5 MG tablet Take 10 mg by mouth once a week. Patient takes on Monday. Caution:Chemotherapy. Protect from light.   Yes [provider]  Multiple Vitamins-Minerals (MULTIVITAL) tablet Take 1 tablet by mouth daily.   Yes [provider]  omeprazole (PRILOSEC) 20 MG capsule Take 1 capsule (20 mg total) by mouth at bedtime. 03/09/16  Yes Arnett, Yvetta Coder, FNP  potassium chloride SA (K-DUR,KLOR-CON) 20 MEQ tablet Take 2 tablets (40 mEq total) by mouth 2 (two) times daily. 06/02/16  Yes Demetrios Loll, MD  predniSONE (DELTASONE) 10 MG tablet Take 10 mg by mouth daily with breakfast. Until seen by dermatologist   Yes [provider]  ramipril (ALTACE) 2.5 MG capsule Take 1 capsule (2.5 mg total) by mouth daily. 06/03/16  Yes Demetrios Loll, MD  albuterol (PROVENTIL) (2.5 MG/3ML) 0.083% nebulizer solution Take 3 mLs (2.5 mg total) by nebulization every 4 (four) hours as needed for wheezing. 07/05/16   Dustin Flock, MD  benzonatate (TESSALON) 100 MG capsule Take 1 capsule (100 mg total) by mouth 3 (three) times daily as needed for cough. 07/05/16   Dustin Flock, MD  cephALEXin (KEFLEX) 500 MG capsule Take 1 capsule (500 mg total) by mouth every 12 (twelve) hours. 07/05/16 07/09/16  Dustin Flock, MD  torsemide (DEMADEX) 20 MG tablet Take 2 tablets (40 mg total) by mouth 2 (two) times daily. 07/05/16 08/04/16  Dustin Flock, MD  Wound Dressings (SILVASORB) GEL Apply 1  application topically 2 (two) times daily as needed. 07/05/16   Dustin Flock, MD   Allergies  Allergen Reactions  . Codeine Other (See Comments)    Reaction:  Dizziness   . Liraglutide     Other reaction(s): nausea and stomach pain   Review of Systems  Physical Exam  Vital Signs: BP (!) 110/45   Pulse 82   Temp 98 F (36.7 C) (Oral)   Resp 18   Ht '5\' 2"'$  (1.575 m)   Wt 96.9 kg (213 lb 10 oz)   SpO2 (!) 89%   BMI 39.07 kg/m  Pain Assessment: No/denies pain POSS *See Group Information*: 1-Acceptable,Awake and alert Pain Score: 0-No pain   SpO2: SpO2: (!) 89 % O2 Device:SpO2: (!) 89 % O2 Flow Rate: .O2 Flow Rate (L/min): 4 L/min  IO: Intake/output summary:  Intake/Output Summary (Last 24 hours) at 07/05/16 1515 Last data filed at 07/05/16 1349  Gross per 24 hour  Intake              720 ml  Output                0 ml  Net              720 ml    LBM: Last BM Date: 07/03/16 Baseline Weight: Weight: 99.8 kg (220 lb) Most recent weight: Weight: 96.9 kg (213 lb 10 oz)     Palliative Assessment/Data: PPS: 50%   Flowsheet Rows     Most Recent Value  Intake Tab  Referral Department  Hospitalist  Unit at Time of Referral  Orthopedic Unit  Palliative Care Primary Diagnosis  Cardiac  Date Notified  07/04/16  Palliative Care Type  New Palliative care  Reason for referral  Clarify Goals of Care  Date of Admission  06/30/16  # of days  IP prior to Palliative referral  4  Clinical Assessment  Psychosocial & Spiritual Assessment  Palliative Care Outcomes      Thank you for this consult. Palliative medicine will continue to follow and assist as needed.   Time In: 1415 Time Out: 1515 Time Total: 60 minutes Greater than 50%  of this time was spent counseling and coordinating care related to the above assessment and plan.  Signed by: Mariana Kaufman, AGNP-C Palliative Medicine    Please contact Palliative Medicine Team phone at [phone] for questions and concerns.    For individual provider: See Shea Evans

## 2016-07-05 NOTE — Progress Notes (Signed)
Pt remaining alert and oriented. Remaining on 2L of oxygen this shift. Dry non productive cough. Voiding without difficulty on bedpan. Pt was able to sleep in between care.

## 2016-07-05 NOTE — Progress Notes (Signed)
Shift assessment completed this am. Pt resting in bed at the time and O2 on at [facility], lungs decreased to bilat bases, murmur auscultated. Pt is alert and oriented. Abdomen is pendulous with purple bruising areas noted bilat. Pt has several skin tears to l arm that are covered with mepitel dressings, all are dry. piv 320 intact to r fa, site is intact and did not flush. bilat forearms are bruised. Pt assissted to void by bedpan.ppp, trace edema to bilat legs and feet noted. Since assessment, pt has been qualified for home o2 for discharge. New inter dry has been placed to pt's l pannus, several open areas noted under this side of pt's pannus, caregive at bedside stated that this was because pt tends to lie on her l side ath ome. Area around the open areas is a little reddened as well, pt stated there has been no increases in discomfort to the area. Pt is aware of impending discharge, has had no complaints of pain ,has done stand/pivot transfer to recliner.

## 2016-07-05 NOTE — Progress Notes (Signed)
New referral for Home PALLIATIVE services received from Palliative Medicine NP Ocie Bob. patient discharged home today. Patient information faxed to referral. Thank you. Dayna Barker RN, BSN, [facility] [place], [facility] [phone] c

## 2016-07-05 NOTE — Care Management Important Message (Signed)
Important Message  Patient Details  Name: [name] MRN: [id number] Date of Birth: April 29, 1945   Medicare Important Message Given:  Yes    [name], RN 07/05/2016, 2:41 PM

## 2016-07-05 NOTE — Progress Notes (Signed)
Took over care at 1500. Patient is being discharged home with caregiver. Reviewed meds, scripts, and last dose given. IV removed with cath intact. O2 tank and neb machine sent with patient. Allowed time for questions. Caregiver was available through discharge process.

## 2016-07-06 ENCOUNTER — Encounter: Payer: Self-pay | Admitting: Family

## 2016-07-06 ENCOUNTER — Ambulatory Visit: Payer: Medicare Other | Attending: Family | Admitting: Family

## 2016-07-06 VITALS — BP 102/45 | HR 86 | Resp 20 | Ht 62.0 in | Wt 218.5 lb

## 2016-07-06 DIAGNOSIS — Z803 Family history of malignant neoplasm of breast: Secondary | ICD-10-CM | POA: Diagnosis not present

## 2016-07-06 DIAGNOSIS — Z888 Allergy status to other drugs, medicaments and biological substances status: Secondary | ICD-10-CM | POA: Diagnosis not present

## 2016-07-06 DIAGNOSIS — K449 Diaphragmatic hernia without obstruction or gangrene: Secondary | ICD-10-CM | POA: Insufficient documentation

## 2016-07-06 DIAGNOSIS — Z794 Long term (current) use of insulin: Secondary | ICD-10-CM | POA: Diagnosis not present

## 2016-07-06 DIAGNOSIS — D649 Anemia, unspecified: Secondary | ICD-10-CM | POA: Diagnosis not present

## 2016-07-06 DIAGNOSIS — Z90710 Acquired absence of both cervix and uterus: Secondary | ICD-10-CM | POA: Diagnosis not present

## 2016-07-06 DIAGNOSIS — IMO0002 Reserved for concepts with insufficient information to code with codable children: Secondary | ICD-10-CM

## 2016-07-06 DIAGNOSIS — I110 Hypertensive heart disease with heart failure: Secondary | ICD-10-CM | POA: Insufficient documentation

## 2016-07-06 DIAGNOSIS — Z8673 Personal history of transient ischemic attack (TIA), and cerebral infarction without residual deficits: Secondary | ICD-10-CM | POA: Diagnosis not present

## 2016-07-06 DIAGNOSIS — Z79899 Other long term (current) drug therapy: Secondary | ICD-10-CM | POA: Diagnosis not present

## 2016-07-06 DIAGNOSIS — E1151 Type 2 diabetes mellitus with diabetic peripheral angiopathy without gangrene: Secondary | ICD-10-CM | POA: Insufficient documentation

## 2016-07-06 DIAGNOSIS — K219 Gastro-esophageal reflux disease without esophagitis: Secondary | ICD-10-CM | POA: Diagnosis not present

## 2016-07-06 DIAGNOSIS — Z800 Family history of malignant neoplasm of digestive organs: Secondary | ICD-10-CM | POA: Diagnosis not present

## 2016-07-06 DIAGNOSIS — I5032 Chronic diastolic (congestive) heart failure: Secondary | ICD-10-CM | POA: Diagnosis present

## 2016-07-06 DIAGNOSIS — I350 Nonrheumatic aortic (valve) stenosis: Secondary | ICD-10-CM | POA: Diagnosis not present

## 2016-07-06 DIAGNOSIS — I10 Essential (primary) hypertension: Secondary | ICD-10-CM | POA: Insufficient documentation

## 2016-07-06 DIAGNOSIS — Z7982 Long term (current) use of aspirin: Secondary | ICD-10-CM | POA: Insufficient documentation

## 2016-07-06 DIAGNOSIS — J45909 Unspecified asthma, uncomplicated: Secondary | ICD-10-CM | POA: Diagnosis not present

## 2016-07-06 DIAGNOSIS — Z8249 Family history of ischemic heart disease and other diseases of the circulatory system: Secondary | ICD-10-CM | POA: Insufficient documentation

## 2016-07-06 DIAGNOSIS — Z9889 Other specified postprocedural states: Secondary | ICD-10-CM | POA: Diagnosis not present

## 2016-07-06 DIAGNOSIS — G2581 Restless legs syndrome: Secondary | ICD-10-CM | POA: Insufficient documentation

## 2016-07-06 DIAGNOSIS — M7989 Other specified soft tissue disorders: Secondary | ICD-10-CM | POA: Diagnosis not present

## 2016-07-06 DIAGNOSIS — E1165 Type 2 diabetes mellitus with hyperglycemia: Secondary | ICD-10-CM

## 2016-07-06 DIAGNOSIS — E1140 Type 2 diabetes mellitus with diabetic neuropathy, unspecified: Secondary | ICD-10-CM

## 2016-07-06 NOTE — Patient Instructions (Signed)
Continue weighing daily and call for an overnight weight gain of > 2 pounds or a weekly weight gain of >5 pounds. 

## 2016-07-06 NOTE — Telephone Encounter (Signed)
Transition Care Management Follow-up Telephone Call  How have you been since you were released from the hospital? Weak but doing better.   Do you understand why you were in the hospital? Yes, I couldn't breathe.   Do you understand the discharge instrcutions? Yes  Items Reviewed:  Medications reviewed: yes  Allergies reviewed:yes  Dietary changes reviewed: yes low fat and low sodium.  Referrals reviewed: Yes,   Functional Questionnaire:   Activities of Daily Living (ADLs):   She states they are independent in the following: All ADLs States they require assistance with the following: No assist needed at this time.   Any transportation issues/concerns? No   Any patient concerns? No   Confirmed importance and date/time of follow-up visits scheduled: Yes   Confirmed with patient if condition begins to worsen call PCP or go to the ER.  Patient was given the Call-a-Nurse line [phone]: Yes.

## 2016-07-06 NOTE — Progress Notes (Signed)
Patient ID: [name], female    DOB: 08-05-1945, 71 y.o.   MRN: [id number]  HPI  [name] is a 71 y/o female with a history of restless leg syndrome, PVD, panic attacks, HTN, hiatal hernia, GERD, DM, CVA, asthma, anemia and chronic heart failure. No tobacco exposure.  Reviewed last echo report done 05/31/16 which showed an EF of 60-65% along with mod/severe AS. Previous echo was done 12/07/15 which showed an EF of 50% with mild MR, AR and TR.  Admitted 06/30/16 with pneumonia and HF exacerbation. Initially started on bipap and levophed infusion. Given IV antibiotics and also diuresed. Discharged home after 5 days. Admitted 06/22/16 with pneumonia. Initially started on IV antibiotics and transitioned to oral antibiotics. Discharged home with home health after 3 days. Admitted 05/30/16 with HF exacerbation. Initially given IV diuretics and transitioned to oral diuretics. Cardiology consult obtained. Aortic stenosis not critical yet. Discharged to SNF for rehab. Was in the ED on 03/03/16 with diarrhea. Treated and released. Was in the ED on 01/29/16 after a mechanical fall. Discharged from the ED. Was last admitted on 12/10/15 with HF exacerbation due to indiscretion of sodium intake and fluid intake. Was diuresed with IV diuretics and had cardiology consult done while admitted. Was discharged to [facility] for PT. Had been in the ER on 11/15/15 due to dizziness and mechanical fall. Had another fall prior on 10/29/15 which resulted in an ER visit.  She presents today for her follow-up visit with a chief complaint of moderate shortness of breath with minimal exertion. She describes this as chronic in nature and has been present for several years. Shortness of breath is improved with wearing her oxygen. She has associated fatigue, cough and minimal swelling in her legs.   Past Medical History:  Diagnosis Date  . Anemia   . Asthma   . CHF (congestive heart failure) ([place])   . CVA (cerebral vascular  accident) ([place])   . Diabetes mellitus without complication ([place])   . GERD (gastroesophageal reflux disease)   . Heart murmur   . History of hiatal hernia   . Hypertension   . Panic attacks   . Peripheral vascular disease ([place])   . Restless leg syndrome   . Shortness of breath dyspnea    Past Surgical History:  Procedure Laterality Date  . ABDOMINAL HYSTERECTOMY    . BREAST BIOPSY Right yrs ago   benign  . ENDARTERECTOMY Right 07/24/2014   Procedure: ENDARTERECTOMY CAROTID;  Surgeon: Algernon Huxley, MD;  Location: [facility] ORS;  Service: Vascular;  Laterality: Right;  . EYE SURGERY Left    cataract  . FRACTURE SURGERY Left    fractured ankle  . TONSILLECTOMY     Family History  Problem Relation Age of Onset  . Pancreatic cancer Mother   . CAD Father   . Hypertension Father   . Pancreatic cancer Father   . Colon cancer Father   . CAD Brother   . Heart disease Brother     Heart attack  . Breast cancer Maternal Aunt     pt states several maternal aunts   Social History  Substance Use Topics  . Smoking status: Never Smoker  . Smokeless tobacco: Never Used  . Alcohol use No   Allergies  Allergen Reactions  . Codeine Other (See Comments)    Reaction:  Dizziness   . Liraglutide     Other reaction(s): nausea and stomach pain   Prior to Admission medications  Medication Sig Start Date End Date Taking? Authorizing Provider  acetaminophen (TYLENOL) 325 MG tablet Take 2 tablets (650 mg total) by mouth every 6 (six) hours as needed for mild pain (or Fever >/= 101). 12/14/15  Yes Gouru, Illene Silver, MD  albuterol (PROVENTIL) (2.5 MG/3ML) 0.083% nebulizer solution Take 3 mLs (2.5 mg total) by nebulization every 4 (four) hours as needed for wheezing. 07/05/16  Yes Dustin Flock, MD  Amino Acids-Protein Hydrolys (FEEDING SUPPLEMENT, PRO-STAT SUGAR FREE 64,) LIQD Take 30 mLs by mouth. 30 ml daily for 30 days 06/16/16  Yes [provider]  aspirin EC 81 MG tablet Take 81 mg by mouth  daily.   Yes [provider]  benzonatate (TESSALON) 100 MG capsule Take 1 capsule (100 mg total) by mouth 3 (three) times daily as needed for cough. 07/05/16  Yes Dustin Flock, MD  cephALEXin (KEFLEX) 500 MG capsule Take 1 capsule (500 mg total) by mouth every 12 (twelve) hours. 07/05/16 07/09/16 Yes Dustin Flock, MD  Cholecalciferol 1000 units capsule Take 1,000 Units by mouth daily.   Yes [provider]  Cyanocobalamin (B-12) 1000 MCG/ML KIT Inject 1,000 mcg as directed every 30 (thirty) days. 02/02/16  Yes [provider]  dapsone 100 MG tablet Take 150 mg by mouth daily.    Yes [provider]  ferrous sulfate 325 (65 FE) MG tablet Take 1 tablet (325 mg total) by mouth daily with breakfast. 11/03/15  Yes Arnett, Yvetta Coder, FNP  folic acid (FOLVITE) 1 MG tablet Take 1 mg by mouth daily. Take one pill daily on Tuesday, Wednesday, Thursday, Friday, Saturday and Sunday. These are days were patient isn't taking methotrexate   Yes [provider]  gabapentin (NEURONTIN) 800 MG tablet Take 800 mg by mouth 2 (two) times daily.   Yes [provider]  hydrochlorothiazide (HYDRODIURIL) 12.5 MG tablet Take 12.5 mg by mouth 2 (two) times daily.    Yes [provider]  insulin aspart (NOVOLOG) 100 UNIT/ML injection Inject 10 Units into the skin 3 (three) times daily before meals. 301-350+ 10 units, 351 or higher NOTIFY MD   Yes [provider]  insulin glargine (LANTUS) 100 UNIT/ML injection Inject 0.2 mLs (20 Units total) into the skin at bedtime. 06/02/16  Yes Demetrios Loll, MD  lovastatin (MEVACOR) 20 MG tablet Take 20 mg by mouth at bedtime.   Yes [provider]  magnesium oxide (MAG-OX) 400 (241.3 Mg) MG tablet Take 1 tablet (400 mg total) by mouth 2 (two) times daily. 06/25/16  Yes Fritzi Mandes, MD  methotrexate (RHEUMATREX) 2.5 MG tablet Take 10 mg by mouth once a week. Patient takes on Monday. Caution:Chemotherapy. Protect from  light.   Yes [provider]  Multiple Vitamins-Minerals (MULTIVITAL) tablet Take 1 tablet by mouth daily.   Yes [provider]  omeprazole (PRILOSEC) 20 MG capsule Take 1 capsule (20 mg total) by mouth at bedtime. 03/09/16  Yes Arnett, Yvetta Coder, FNP  potassium chloride SA (K-DUR,KLOR-CON) 20 MEQ tablet Take 2 tablets (40 mEq total) by mouth 2 (two) times daily. 06/02/16  Yes Demetrios Loll, MD  predniSONE (DELTASONE) 10 MG tablet Take 10 mg by mouth daily with breakfast. Until seen by dermatologist   Yes [provider]  ramipril (ALTACE) 2.5 MG capsule Take 1 capsule (2.5 mg total) by mouth daily. 06/03/16  Yes Demetrios Loll, MD  torsemide (DEMADEX) 20 MG tablet Take 2 tablets (40 mg total) by mouth 2 (two) times daily. 07/05/16 08/04/16 Yes Patel,  Shreyang, MD  Wound Dressings (SILVASORB) GEL Apply 1 application topically 2 (two) times daily as needed. 07/05/16  Yes Dustin Flock, MD   Review of Systems  Constitutional: Positive for fatigue. Negative for appetite change.  HENT: Negative for congestion, postnasal drip and sore throat.   Eyes: Negative.   Respiratory: Positive for cough and shortness of breath. Negative for chest tightness.   Cardiovascular: Positive for leg swelling. Negative for chest pain and palpitations.  Gastrointestinal: Negative for abdominal distention and abdominal pain.  Endocrine: Negative.   Genitourinary: Negative.   Musculoskeletal: Negative for back pain and neck pain.  Skin: Positive for rash (in folds of abdomen; currently being treated).  Allergic/Immunologic: Negative.   Neurological: Positive for weakness (both legs; improving) and light-headedness. Negative for dizziness.  Hematological: Negative for adenopathy. Bruises/bleeds easily.  Psychiatric/Behavioral: Negative for dysphoric mood and sleep disturbance (sleeping on 2 pillows; sleeping well). The patient is not nervous/anxious.    Vitals:   07/06/16 1059  BP: (!) 102/45  Pulse: 86   Resp: 20  SpO2: 90%  Weight: 218 lb 8 oz (99.1 kg)  Height: 5' 2"  (1.575 m)   Wt Readings from Last 3 Encounters:  07/06/16 218 lb 8 oz (99.1 kg)  06/30/16 213 lb 10 oz (96.9 kg)  06/23/16 220 lb 7.4 oz (100 kg)    Lab Results  Component Value Date   CREATININE 0.83 07/04/2016   CREATININE 0.68 07/03/2016   CREATININE 0.65 07/02/2016   Physical Exam  Constitutional: She is oriented to person, place, and time. She appears well-developed and well-nourished.  HENT:  Head: Normocephalic and atraumatic.  Neck: Normal range of motion. Neck supple. No JVD present.  Cardiovascular: Normal rate and regular rhythm.   Pulmonary/Chest: Effort normal. She has no wheezes. She has rhonchi in the right lower field. She has no rales.  Abdominal: Soft. She exhibits no distension. There is no tenderness.  Musculoskeletal: She exhibits edema (trace pitting edema around bilateral ankles). She exhibits no tenderness.  Neurological: She is alert and oriented to person, place, and time.  Skin: Skin is warm and dry.  Psychiatric: She has a normal mood and affect. Her behavior is normal. Thought content normal.  Nursing note and vitals reviewed.     Assessment & Plan:  1: Chronic heart failure with preserved ejection fraction- - NYHA class III - euvolemic today - continues to weigh at home but with assistance. Call for an overnight weight gain of >2 pounds or a weekly weight gain of >5 pounds.  - not using salt. Using pepper and salt substitute.  - drinking about 30-40 ounces of fluid daily - advised to use naproxen sparingly - now has 2 around the clock caregivers - sees cardiologist Hollice Gong) 07/12/16  2: HTN- - BP looks good today - continue medications  3: Diabetes- - glucose this morning was 272 - admits to eating more sweet foods - saw PCP (Arnett) 04/25/16 and returns 07/07/16  Return in 1 month or sooner for any questions/problems before then.

## 2016-07-06 NOTE — Telephone Encounter (Signed)
First attempt made for TCM was not able to reach patient left message to return call to office.

## 2016-07-07 ENCOUNTER — Encounter: Payer: Self-pay | Admitting: Family

## 2016-07-07 ENCOUNTER — Encounter: Payer: Self-pay | Admitting: Emergency Medicine

## 2016-07-07 ENCOUNTER — Other Ambulatory Visit: Payer: Self-pay | Admitting: Family

## 2016-07-07 ENCOUNTER — Emergency Department: Payer: Medicare Other

## 2016-07-07 ENCOUNTER — Observation Stay
Admission: EM | Admit: 2016-07-07 | Discharge: 2016-07-09 | Disposition: A | Discharge status: Home / Self Care | Payer: Medicare Other | Attending: Internal Medicine | Admitting: Internal Medicine

## 2016-07-07 ENCOUNTER — Ambulatory Visit (INDEPENDENT_AMBULATORY_CARE_PROVIDER_SITE_OTHER): Payer: Medicare Other | Admitting: Family

## 2016-07-07 VITALS — BP 108/58 | HR 88 | Temp 98.0°F | Ht 62.0 in | Wt 273.4 lb

## 2016-07-07 DIAGNOSIS — R2689 Other abnormalities of gait and mobility: Secondary | ICD-10-CM | POA: Diagnosis not present

## 2016-07-07 DIAGNOSIS — Z7951 Long term (current) use of inhaled steroids: Secondary | ICD-10-CM | POA: Insufficient documentation

## 2016-07-07 DIAGNOSIS — Z79899 Other long term (current) drug therapy: Secondary | ICD-10-CM | POA: Insufficient documentation

## 2016-07-07 DIAGNOSIS — I509 Heart failure, unspecified: Secondary | ICD-10-CM | POA: Insufficient documentation

## 2016-07-07 DIAGNOSIS — R0600 Dyspnea, unspecified: Secondary | ICD-10-CM

## 2016-07-07 DIAGNOSIS — Z794 Long term (current) use of insulin: Secondary | ICD-10-CM | POA: Insufficient documentation

## 2016-07-07 DIAGNOSIS — I5032 Chronic diastolic (congestive) heart failure: Secondary | ICD-10-CM | POA: Diagnosis not present

## 2016-07-07 DIAGNOSIS — E1151 Type 2 diabetes mellitus with diabetic peripheral angiopathy without gangrene: Secondary | ICD-10-CM | POA: Diagnosis not present

## 2016-07-07 DIAGNOSIS — J9620 Acute and chronic respiratory failure, unspecified whether with hypoxia or hypercapnia: Secondary | ICD-10-CM | POA: Diagnosis present

## 2016-07-07 DIAGNOSIS — G2581 Restless legs syndrome: Secondary | ICD-10-CM | POA: Diagnosis not present

## 2016-07-07 DIAGNOSIS — J9621 Acute and chronic respiratory failure with hypoxia: Secondary | ICD-10-CM | POA: Diagnosis not present

## 2016-07-07 DIAGNOSIS — N289 Disorder of kidney and ureter, unspecified: Secondary | ICD-10-CM | POA: Insufficient documentation

## 2016-07-07 DIAGNOSIS — E872 Acidosis, unspecified: Secondary | ICD-10-CM

## 2016-07-07 DIAGNOSIS — T380X5A Adverse effect of glucocorticoids and synthetic analogues, initial encounter: Secondary | ICD-10-CM | POA: Insufficient documentation

## 2016-07-07 DIAGNOSIS — Z8673 Personal history of transient ischemic attack (TIA), and cerebral infarction without residual deficits: Secondary | ICD-10-CM | POA: Insufficient documentation

## 2016-07-07 DIAGNOSIS — E1165 Type 2 diabetes mellitus with hyperglycemia: Secondary | ICD-10-CM | POA: Diagnosis not present

## 2016-07-07 DIAGNOSIS — Z7982 Long term (current) use of aspirin: Secondary | ICD-10-CM | POA: Diagnosis not present

## 2016-07-07 DIAGNOSIS — I110 Hypertensive heart disease with heart failure: Secondary | ICD-10-CM | POA: Insufficient documentation

## 2016-07-07 DIAGNOSIS — M6281 Muscle weakness (generalized): Secondary | ICD-10-CM | POA: Insufficient documentation

## 2016-07-07 DIAGNOSIS — R7989 Other specified abnormal findings of blood chemistry: Secondary | ICD-10-CM

## 2016-07-07 DIAGNOSIS — J441 Chronic obstructive pulmonary disease with (acute) exacerbation: Secondary | ICD-10-CM | POA: Diagnosis not present

## 2016-07-07 DIAGNOSIS — K219 Gastro-esophageal reflux disease without esophagitis: Secondary | ICD-10-CM | POA: Insufficient documentation

## 2016-07-07 DIAGNOSIS — L8990 Pressure ulcer of unspecified site, unspecified stage: Secondary | ICD-10-CM | POA: Insufficient documentation

## 2016-07-07 DIAGNOSIS — J449 Chronic obstructive pulmonary disease, unspecified: Secondary | ICD-10-CM | POA: Diagnosis not present

## 2016-07-07 DIAGNOSIS — R778 Other specified abnormalities of plasma proteins: Secondary | ICD-10-CM

## 2016-07-07 DIAGNOSIS — I10 Essential (primary) hypertension: Secondary | ICD-10-CM

## 2016-07-07 LAB — CBC WITH DIFFERENTIAL/PLATELET
BASOS ABS: 0 10*3/uL (ref 0–0.1)
Band Neutrophils: 2 %
Basophils Relative: 0 %
Blasts: 0 %
Eosinophils Absolute: 0 10*3/uL (ref 0–0.7)
Eosinophils Relative: 0 %
HCT: 30.2 % — ABNORMAL LOW (ref 35.0–47.0)
HEMOGLOBIN: 9.9 g/dL — AB (ref 12.0–16.0)
Lymphocytes Relative: 7 %
Lymphs Abs: 0.7 10*3/uL — ABNORMAL LOW (ref 1.0–3.6)
MCH: 31.1 pg (ref 26.0–34.0)
MCHC: 32.9 g/dL (ref 32.0–36.0)
MCV: 94.6 fL (ref 80.0–100.0)
METAMYELOCYTES PCT: 1 %
MYELOCYTES: 0 %
Monocytes Absolute: 0.7 10*3/uL (ref 0.2–0.9)
Monocytes Relative: 7 %
NEUTROS PCT: 83 %
NRBC: 0 /100{WBCs}
Neutro Abs: 9.1 10*3/uL — ABNORMAL HIGH (ref 1.4–6.5)
Other: 0 %
PLATELETS: 186 10*3/uL (ref 150–440)
PROMYELOCYTES ABS: 0 %
RBC: 3.2 MIL/uL — AB (ref 3.80–5.20)
RDW: 20 % — ABNORMAL HIGH (ref 11.5–14.5)
WBC: 10.5 10*3/uL (ref 3.6–11.0)

## 2016-07-07 LAB — COMPREHENSIVE METABOLIC PANEL
ALK PHOS: 151 U/L — AB (ref 38–126)
ALT: 34 U/L (ref 14–54)
AST: 65 U/L — ABNORMAL HIGH (ref 15–41)
Albumin: 3.2 g/dL — ABNORMAL LOW (ref 3.5–5.0)
Anion gap: 11 (ref 5–15)
BILIRUBIN TOTAL: 2.4 mg/dL — AB (ref 0.3–1.2)
BUN: 13 mg/dL (ref 6–20)
CALCIUM: 8.9 mg/dL (ref 8.9–10.3)
CO2: 32 mmol/L (ref 22–32)
CREATININE: 1.03 mg/dL — AB (ref 0.44–1.00)
Chloride: 89 mmol/L — ABNORMAL LOW (ref 101–111)
GFR, EST NON AFRICAN AMERICAN: 54 mL/min — AB (ref >60)
Glucose, Bld: 458 mg/dL — ABNORMAL HIGH (ref 65–99)
Potassium: 4.3 mmol/L (ref 3.5–5.1)
Sodium: 132 mmol/L — ABNORMAL LOW (ref 135–145)
TOTAL PROTEIN: 6.8 g/dL (ref 6.5–8.1)

## 2016-07-07 LAB — URINALYSIS, ROUTINE W REFLEX MICROSCOPIC
Bacteria, UA: NONE SEEN
Bilirubin Urine: NEGATIVE
Glucose, UA: >=500 mg/dL — AB
Hgb urine dipstick: NEGATIVE
Ketones, ur: NEGATIVE mg/dL
LEUKOCYTES UA: NEGATIVE
Nitrite: NEGATIVE
Protein, ur: NEGATIVE mg/dL
SPECIFIC GRAVITY, URINE: 1.007 (ref 1.005–1.030)
pH: 7 (ref 5.0–8.0)

## 2016-07-07 LAB — GLUCOSE, CAPILLARY
GLUCOSE-CAPILLARY: 546 mg/dL — AB (ref 65–99)
Glucose-Capillary: 586 mg/dL (ref 65–99)

## 2016-07-07 LAB — BRAIN NATRIURETIC PEPTIDE: B NATRIURETIC PEPTIDE 5: 53 pg/mL (ref 0.0–100.0)

## 2016-07-07 LAB — LACTIC ACID, PLASMA
LACTIC ACID, VENOUS: 4.4 mmol/L — AB (ref 0.5–1.9)
LACTIC ACID, VENOUS: 3.7 mmol/L — AB (ref 0.5–1.9)

## 2016-07-07 LAB — TROPONIN I

## 2016-07-07 MED ORDER — ASPIRIN EC 81 MG PO TBEC
81.0000 mg | DELAYED_RELEASE_TABLET | Freq: Every day | ORAL | Status: DC
  Administered 2016-07-08: 81 mg via ORAL
  Filled 2016-07-07 (×3): qty 1

## 2016-07-07 MED ORDER — METHYLPREDNISOLONE SODIUM SUCC 125 MG IJ SOLR
125.0000 mg | INTRAMUSCULAR | Status: AC
  Administered 2016-07-07: 125 mg via INTRAVENOUS
  Filled 2016-07-07: qty 2

## 2016-07-07 MED ORDER — INSULIN ASPART 100 UNIT/ML ~~LOC~~ SOLN
25.0000 [IU] | Freq: Once | SUBCUTANEOUS | Status: AC
  Administered 2016-07-07: 25 [IU] via SUBCUTANEOUS
  Filled 2016-07-07: qty 25

## 2016-07-07 MED ORDER — FOLIC ACID 1 MG PO TABS
1.0000 mg | ORAL_TABLET | ORAL | Status: DC
  Administered 2016-07-08 – 2016-07-09 (×2): 1 mg via ORAL
  Filled 2016-07-07 (×2): qty 1

## 2016-07-07 MED ORDER — BENZONATATE 100 MG PO CAPS: 100.0000 mg | ORAL_CAPSULE | Freq: Three times a day (TID) | ORAL | Status: DC | PRN

## 2016-07-07 MED ORDER — ACETAMINOPHEN 325 MG PO TABS: 650.0000 mg | ORAL_TABLET | Freq: Four times a day (QID) | ORAL | Status: DC | PRN

## 2016-07-07 MED ORDER — DOCUSATE SODIUM 100 MG PO CAPS
100.0000 mg | ORAL_CAPSULE | Freq: Two times a day (BID) | ORAL | Status: DC
  Administered 2016-07-07 – 2016-07-09 (×4): 100 mg via ORAL
  Filled 2016-07-07 (×4): qty 1

## 2016-07-07 MED ORDER — ONDANSETRON HCL 4 MG PO TABS: 4.0000 mg | ORAL_TABLET | Freq: Four times a day (QID) | ORAL | Status: DC | PRN

## 2016-07-07 MED ORDER — SODIUM CHLORIDE 0.9 % IV SOLN
INTRAVENOUS | Status: AC
  Administered 2016-07-07: 18:00:00 via INTRAVENOUS

## 2016-07-07 MED ORDER — METHYLPREDNISOLONE SODIUM SUCC 125 MG IJ SOLR
60.0000 mg | Freq: Two times a day (BID) | INTRAMUSCULAR | Status: DC
  Administered 2016-07-08: 60 mg via INTRAVENOUS
  Filled 2016-07-07: qty 2

## 2016-07-07 MED ORDER — INSULIN ASPART 100 UNIT/ML ~~LOC~~ SOLN
6.0000 [IU] | Freq: Three times a day (TID) | SUBCUTANEOUS | Status: DC
  Administered 2016-07-08 – 2016-07-09 (×4): 6 [IU] via SUBCUTANEOUS
  Filled 2016-07-07 (×4): qty 6

## 2016-07-07 MED ORDER — SODIUM CHLORIDE 0.9 % IV BOLUS (SEPSIS): 100.0000 mL | Freq: Once | INTRAVENOUS | Status: DC

## 2016-07-07 MED ORDER — MAGNESIUM OXIDE 400 (241.3 MG) MG PO TABS
400.0000 mg | ORAL_TABLET | Freq: Two times a day (BID) | ORAL | Status: DC
  Administered 2016-07-07 – 2016-07-09 (×4): 400 mg via ORAL
  Filled 2016-07-07 (×4): qty 1

## 2016-07-07 MED ORDER — TORSEMIDE 20 MG PO TABS
40.0000 mg | ORAL_TABLET | Freq: Two times a day (BID) | ORAL | Status: DC
Start: 2016-07-07 — End: 2016-07-09
  Administered 2016-07-07 – 2016-07-09 (×4): 40 mg via ORAL
  Filled 2016-07-07 (×5): qty 2

## 2016-07-07 MED ORDER — GABAPENTIN 800 MG PO TABS
800.0000 mg | ORAL_TABLET | Freq: Two times a day (BID) | ORAL | Status: DC
  Filled 2016-07-07: qty 1

## 2016-07-07 MED ORDER — ADULT MULTIVITAMIN W/MINERALS CH
1.0000 | ORAL_TABLET | Freq: Every day | ORAL | Status: DC
  Administered 2016-07-08 – 2016-07-09 (×2): 1 via ORAL
  Filled 2016-07-07 (×2): qty 1

## 2016-07-07 MED ORDER — CEPHALEXIN 500 MG PO CAPS
500.0000 mg | ORAL_CAPSULE | Freq: Two times a day (BID) | ORAL | Status: DC
  Administered 2016-07-07 – 2016-07-09 (×4): 500 mg via ORAL
  Filled 2016-07-07 (×5): qty 1

## 2016-07-07 MED ORDER — DAPSONE 25 MG PO TABS
150.0000 mg | ORAL_TABLET | Freq: Every day | ORAL | Status: DC
  Administered 2016-07-08 – 2016-07-09 (×2): 150 mg via ORAL
  Filled 2016-07-07 (×2): qty 2

## 2016-07-07 MED ORDER — IPRATROPIUM-ALBUTEROL 0.5-2.5 (3) MG/3ML IN SOLN
3.0000 mL | Freq: Once | RESPIRATORY_TRACT | Status: AC
  Administered 2016-07-07: 3 mL via RESPIRATORY_TRACT
  Filled 2016-07-07: qty 3

## 2016-07-07 MED ORDER — INSULIN ASPART 100 UNIT/ML ~~LOC~~ SOLN
0.0000 [IU] | Freq: Every day | SUBCUTANEOUS | Status: DC
  Administered 2016-07-07: 5 [IU] via SUBCUTANEOUS
  Administered 2016-07-08: 3 [IU] via SUBCUTANEOUS
  Filled 2016-07-07: qty 5
  Filled 2016-07-07: qty 3

## 2016-07-07 MED ORDER — GUAIFENESIN ER 600 MG PO TB12
600.0000 mg | ORAL_TABLET | Freq: Two times a day (BID) | ORAL | Status: DC
  Administered 2016-07-07 – 2016-07-09 (×4): 600 mg via ORAL
  Filled 2016-07-07 (×4): qty 1

## 2016-07-07 MED ORDER — VITAMIN D 1000 UNITS PO TABS
1000.0000 [IU] | ORAL_TABLET | Freq: Every day | ORAL | Status: DC
  Administered 2016-07-08 – 2016-07-09 (×2): 1000 [IU] via ORAL
  Filled 2016-07-07 (×2): qty 1

## 2016-07-07 MED ORDER — ACETAMINOPHEN 650 MG RE SUPP
650.0000 mg | Freq: Four times a day (QID) | RECTAL | Status: DC | PRN
Start: 2016-07-07 — End: 2016-07-09

## 2016-07-07 MED ORDER — PANTOPRAZOLE SODIUM 40 MG PO TBEC
40.0000 mg | DELAYED_RELEASE_TABLET | Freq: Every day | ORAL | Status: DC
  Administered 2016-07-08 – 2016-07-09 (×2): 40 mg via ORAL
  Filled 2016-07-07 (×2): qty 1

## 2016-07-07 MED ORDER — IPRATROPIUM-ALBUTEROL 0.5-2.5 (3) MG/3ML IN SOLN
RESPIRATORY_TRACT | Status: AC
  Filled 2016-07-07: qty 6

## 2016-07-07 MED ORDER — SENNA 8.6 MG PO TABS
1.0000 | ORAL_TABLET | Freq: Two times a day (BID) | ORAL | Status: DC
  Administered 2016-07-07 – 2016-07-08 (×2): 8.6 mg via ORAL
  Filled 2016-07-07 (×2): qty 1

## 2016-07-07 MED ORDER — PRO-STAT SUGAR FREE PO LIQD
30.0000 mL | Freq: Every day | ORAL | Status: DC
  Administered 2016-07-08: 30 mL via ORAL

## 2016-07-07 MED ORDER — ALBUTEROL SULFATE (2.5 MG/3ML) 0.083% IN NEBU: 2.5000 mg | INHALATION_SOLUTION | RESPIRATORY_TRACT | Status: DC | PRN

## 2016-07-07 MED ORDER — INSULIN ASPART 100 UNIT/ML ~~LOC~~ SOLN
0.0000 [IU] | Freq: Three times a day (TID) | SUBCUTANEOUS | Status: DC
  Administered 2016-07-08: 7 [IU] via SUBCUTANEOUS
  Administered 2016-07-08: 4 [IU] via SUBCUTANEOUS
  Administered 2016-07-08: 3 [IU] via SUBCUTANEOUS
  Administered 2016-07-09: 4 [IU] via SUBCUTANEOUS
  Filled 2016-07-07: qty 4
  Filled 2016-07-07: qty 7
  Filled 2016-07-07: qty 4
  Filled 2016-07-07: qty 3

## 2016-07-07 MED ORDER — ENOXAPARIN SODIUM 40 MG/0.4ML ~~LOC~~ SOLN
40.0000 mg | Freq: Two times a day (BID) | SUBCUTANEOUS | Status: DC
  Administered 2016-07-07 – 2016-07-09 (×4): 40 mg via SUBCUTANEOUS
  Filled 2016-07-07 (×4): qty 0.4

## 2016-07-07 MED ORDER — FERROUS SULFATE 325 (65 FE) MG PO TABS
325.0000 mg | ORAL_TABLET | Freq: Every day | ORAL | Status: DC
  Administered 2016-07-08 – 2016-07-09 (×2): 325 mg via ORAL
  Filled 2016-07-07 (×2): qty 1

## 2016-07-07 MED ORDER — METHYLPREDNISOLONE SODIUM SUCC 125 MG IJ SOLR: 60.0000 mg | Freq: Two times a day (BID) | INTRAMUSCULAR | Status: DC

## 2016-07-07 MED ORDER — PRAVASTATIN SODIUM 20 MG PO TABS
20.0000 mg | ORAL_TABLET | Freq: Every day | ORAL | Status: DC
  Administered 2016-07-07 – 2016-07-08 (×2): 20 mg via ORAL
  Filled 2016-07-07 (×2): qty 1

## 2016-07-07 MED ORDER — RAMIPRIL 2.5 MG PO CAPS
2.5000 mg | ORAL_CAPSULE | Freq: Every day | ORAL | Status: DC
  Administered 2016-07-08 – 2016-07-09 (×2): 2.5 mg via ORAL
  Filled 2016-07-07 (×2): qty 1

## 2016-07-07 MED ORDER — VANCOMYCIN HCL IN DEXTROSE 1-5 GM/200ML-% IV SOLN
1000.0000 mg | Freq: Once | INTRAVENOUS | Status: AC
  Administered 2016-07-07: 1000 mg via INTRAVENOUS
  Filled 2016-07-07: qty 200

## 2016-07-07 MED ORDER — TIOTROPIUM BROMIDE MONOHYDRATE 18 MCG IN CAPS
18.0000 ug | ORAL_CAPSULE | Freq: Every day | RESPIRATORY_TRACT | Status: DC
  Administered 2016-07-07 – 2016-07-09 (×2): 18 ug via RESPIRATORY_TRACT
  Filled 2016-07-07: qty 5

## 2016-07-07 MED ORDER — HYDROCHLOROTHIAZIDE 25 MG PO TABS
12.5000 mg | ORAL_TABLET | Freq: Two times a day (BID) | ORAL | Status: DC
  Administered 2016-07-07 – 2016-07-09 (×4): 12.5 mg via ORAL
  Filled 2016-07-07 (×4): qty 1

## 2016-07-07 MED ORDER — SODIUM CHLORIDE 0.9 % IV BOLUS (SEPSIS)
500.0000 mL | Freq: Once | INTRAVENOUS | Status: AC
  Administered 2016-07-07: 500 mL via INTRAVENOUS

## 2016-07-07 MED ORDER — POTASSIUM CHLORIDE CRYS ER 20 MEQ PO TBCR
40.0000 meq | EXTENDED_RELEASE_TABLET | Freq: Two times a day (BID) | ORAL | Status: DC
  Administered 2016-07-07 – 2016-07-09 (×4): 40 meq via ORAL
  Filled 2016-07-07 (×4): qty 2

## 2016-07-07 MED ORDER — SODIUM CHLORIDE 0.9 % IV SOLN
Freq: Once | INTRAVENOUS | Status: AC
  Administered 2016-07-07: 16:00:00 via INTRAVENOUS

## 2016-07-07 MED ORDER — METHOTREXATE 2.5 MG PO TABS: 10.0000 mg | ORAL_TABLET | ORAL | Status: DC

## 2016-07-07 MED ORDER — ALBUTEROL SULFATE (2.5 MG/3ML) 0.083% IN NEBU
2.5000 mg | INHALATION_SOLUTION | Freq: Once | RESPIRATORY_TRACT | Status: DC
  Administered 2016-07-07: 2.5 mg via RESPIRATORY_TRACT

## 2016-07-07 MED ORDER — CEFEPIME-DEXTROSE 1 GM/50ML IV SOLR
1.0000 g | INTRAVENOUS | Status: AC
  Administered 2016-07-07: 1 g via INTRAVENOUS
  Filled 2016-07-07: qty 50

## 2016-07-07 MED ORDER — SODIUM CHLORIDE 0.9 % IV BOLUS (SEPSIS)
1000.0000 mL | Freq: Once | INTRAVENOUS | Status: AC
  Administered 2016-07-07: 1000 mL via INTRAVENOUS

## 2016-07-07 MED ORDER — GABAPENTIN 300 MG PO CAPS
800.0000 mg | ORAL_CAPSULE | Freq: Two times a day (BID) | ORAL | Status: DC
  Administered 2016-07-07 – 2016-07-09 (×4): 800 mg via ORAL
  Filled 2016-07-07 (×5): qty 2

## 2016-07-07 MED ORDER — INSULIN ASPART 100 UNIT/ML ~~LOC~~ SOLN: 10.0000 [IU] | Freq: Three times a day (TID) | SUBCUTANEOUS | Status: DC

## 2016-07-07 MED ORDER — INSULIN GLARGINE 100 UNIT/ML ~~LOC~~ SOLN
20.0000 [IU] | Freq: Every day | SUBCUTANEOUS | Status: DC
  Administered 2016-07-07 – 2016-07-08 (×2): 20 [IU] via SUBCUTANEOUS
  Filled 2016-07-07 (×3): qty 0.2

## 2016-07-07 MED ORDER — ONDANSETRON HCL 4 MG/2ML IJ SOLN: 4.0000 mg | Freq: Four times a day (QID) | INTRAMUSCULAR | Status: DC | PRN

## 2016-07-07 NOTE — Patient Instructions (Addendum)
Emergency room today  Other items discussed    Referral to pulmonology  Ensure you see cardiology [facility] 5/15 as concern your aortic stenosis ( per hospital notes) is contributory. You may discuss with [place].  Please  Make follow up with dermatology to clarify whether she says on methotrexate, prednisone, and dapsone for rash.   Continue follow up with Heart Failure clinic, limiting salt, daily weight.   Labs today   follow up in 6 weeks for repeat CXR- please also bring ALL OLD AND NEW MEDICATIONS FOR MED REC.

## 2016-07-07 NOTE — ED Triage Notes (Signed)
Pt sent from PCP for sats 86% on 6 L. Pt labored in triage with minimal exertion. Just left hospital yesterday for PNA. Wears 4 L all the time. Goal sat 90 per caregiver. Denies fevers.

## 2016-07-07 NOTE — ED Notes (Signed)
Dr. Fanny Bien informed of elevated Lactic acid of 4.4.

## 2016-07-07 NOTE — Assessment & Plan Note (Signed)
Controlled today 

## 2016-07-07 NOTE — Assessment & Plan Note (Addendum)
Recent PNA, left pleural effusion. No pulmonologist- referral placed. On 6L o2, highest sa02 in room as 85%. Worry about Co2 retaining in copd. In no acute respiratory distress however was uncomfortable sending patient home with such low o2. After talking with caregivers and patient, we jointly agreed referral to back to [facility] ED was appropriate. Report given to triage. Patient felt comfortable riding with caregivers in personal vehicle.

## 2016-07-07 NOTE — Progress Notes (Signed)
Notified Dr. Winona Legato of lactic acid of 3.7 which is decreased. No new orders received will continue to monitor.

## 2016-07-07 NOTE — Progress Notes (Signed)
Notified Dr. Winona Legato that blood glucose is greater than 600. Per MD give bolus of normal saline over 2 hours and 25 units of novolog.

## 2016-07-07 NOTE — H&P (Signed)
[facility] [place] NAME: [name]    MR#:  [id number]  DATE OF BIRTH:  03-03-1945  DATE OF ADMISSION:  07/07/2016  PRIMARY CARE PHYSICIAN: Burnard Hawthorne, FNP   REQUESTING/REFERRING PHYSICIAN:   CHIEF COMPLAINT:   Chief Complaint  Patient presents with  . Shortness of Breath    HISTORY OF PRESENT ILLNESS: [name]  is a 71 y.o. female with a known history of Recent admission to the hospital from the third to 07/05/2016 for pneumonia, discharged on Keflex, who presents to the hospital with complaints of hypoxia. According to the patient, she was doing well after discharge from the hospital, however, noted to have a little bit more shortness of breath and wheezing. She was seen by her primary care physician, where she was noted to be hypoxic on her usual 3 L of oxygen, oxygen saturations were 84%. Patient was sent to emergency room for further evaluation and treatment. Her chest x-ray revealed improvement of pneumonia and pleural effusion. Lactic acid level was found to be elevated. Hospitalist services were contacted for admission. The patient admits of some chest pains intermittently with food intake, few hours after food intake, especially spicy foods.  PAST MEDICAL HISTORY:   Past Medical History:  Diagnosis Date  . Anemia   . Asthma   . CHF (congestive heart failure) ([place])   . CVA (cerebral vascular accident) ([place])   . Diabetes mellitus without complication ([place])   . GERD (gastroesophageal reflux disease)   . Heart murmur   . History of hiatal hernia   . Hypertension   . Panic attacks   . Peripheral vascular disease ([place])   . Restless leg syndrome   . Shortness of breath dyspnea     PAST SURGICAL HISTORY: Past Surgical History:  Procedure Laterality Date  . ABDOMINAL HYSTERECTOMY    . BREAST BIOPSY Right yrs ago   benign  . ENDARTERECTOMY Right 07/24/2014   Procedure: ENDARTERECTOMY CAROTID;  Surgeon:  Algernon Huxley, MD;  Location: [facility] ORS;  Service: Vascular;  Laterality: Right;  . EYE SURGERY Left    cataract  . FRACTURE SURGERY Left    fractured ankle  . TONSILLECTOMY      SOCIAL HISTORY:  Social History  Substance Use Topics  . Smoking status: Never Smoker  . Smokeless tobacco: Never Used  . Alcohol use No    FAMILY HISTORY:  Family History  Problem Relation Age of Onset  . Pancreatic cancer Mother   . CAD Father   . Hypertension Father   . Pancreatic cancer Father   . Colon cancer Father   . CAD Brother   . Heart disease Brother        Heart attack  . Breast cancer Maternal Aunt        pt states several maternal aunts    DRUG ALLERGIES:  Allergies  Allergen Reactions  . Codeine Other (See Comments)    Reaction:  Dizziness   . Liraglutide     Other reaction(s): nausea and stomach pain    Review of Systems  Constitutional: Positive for weight loss. Negative for chills and fever.  HENT: Negative for congestion.   Eyes: Positive for blurred vision. Negative for double vision.  Respiratory: Positive for cough, shortness of breath and wheezing. Negative for sputum production.   Cardiovascular: Positive for chest pain. Negative for palpitations, orthopnea, leg swelling and PND.  Gastrointestinal: Positive for constipation. Negative for abdominal pain,  blood in stool, diarrhea, nausea and vomiting.  Genitourinary: Negative for dysuria, frequency, hematuria and urgency.  Musculoskeletal: Negative for falls.  Neurological: Negative for dizziness, tremors, focal weakness and headaches.  Endo/Heme/Allergies: Does not bruise/bleed easily.  Psychiatric/Behavioral: Negative for depression. The patient does not have insomnia.     MEDICATIONS AT HOME:  Prior to Admission medications   Medication Sig Start Date End Date Taking? Authorizing Provider  acetaminophen (TYLENOL) 325 MG tablet Take 2 tablets (650 mg total) by mouth every 6 (six) hours as needed for mild pain (or  Fever >/= 101). 12/14/15   Gouru, Illene Silver, MD  albuterol (PROVENTIL) (2.5 MG/3ML) 0.083% nebulizer solution Take 3 mLs (2.5 mg total) by nebulization every 4 (four) hours as needed for wheezing. 07/05/16   Dustin Flock, MD  Amino Acids-Protein Hydrolys (FEEDING SUPPLEMENT, PRO-STAT SUGAR FREE 64,) LIQD Take 30 mLs by mouth. 30 ml daily for 30 days 06/16/16   [provider]  aspirin EC 81 MG tablet Take 81 mg by mouth daily.    [provider]  benzonatate (TESSALON) 100 MG capsule Take 1 capsule (100 mg total) by mouth 3 (three) times daily as needed for cough. 07/05/16   Dustin Flock, MD  cephALEXin (KEFLEX) 500 MG capsule Take 1 capsule (500 mg total) by mouth every 12 (twelve) hours. 07/05/16 07/09/16  Dustin Flock, MD  Cholecalciferol 1000 units capsule Take 1,000 Units by mouth daily.    [provider]  Cyanocobalamin (B-12) 1000 MCG/ML KIT Inject 1,000 mcg as directed every 30 (thirty) days. 02/02/16   [provider]  dapsone 100 MG tablet Take 150 mg by mouth daily.     [provider]  ferrous sulfate 325 (65 FE) MG tablet Take 1 tablet (325 mg total) by mouth daily with breakfast. 11/03/15   Burnard Hawthorne, FNP  folic acid (FOLVITE) 1 MG tablet Take 1 mg by mouth daily. Take one pill daily on Tuesday, Wednesday, Thursday, Friday, Saturday and Sunday. These are days were patient isn't taking methotrexate    [provider]  gabapentin (NEURONTIN) 800 MG tablet Take 800 mg by mouth 2 (two) times daily.    [provider]  hydrochlorothiazide (HYDRODIURIL) 12.5 MG tablet Take 12.5 mg by mouth 2 (two) times daily.     [provider]  insulin aspart (NOVOLOG) 100 UNIT/ML injection Inject 10 Units into the skin 3 (three) times daily before meals. 301-350+ 10 units, 351 or higher NOTIFY MD    [provider]  insulin glargine (LANTUS) 100 UNIT/ML injection Inject 0.2 mLs (20 Units total) into the skin at bedtime.  06/02/16   Demetrios Loll, MD  lovastatin (MEVACOR) 20 MG tablet Take 20 mg by mouth at bedtime.    [provider]  magnesium oxide (MAG-OX) 400 (241.3 Mg) MG tablet Take 1 tablet (400 mg total) by mouth 2 (two) times daily. 06/25/16   Fritzi Mandes, MD  methotrexate (RHEUMATREX) 2.5 MG tablet Take 10 mg by mouth once a week. Patient takes on Monday. Caution:Chemotherapy. Protect from light.    [provider]  Multiple Vitamins-Minerals (MULTIVITAL) tablet Take 1 tablet by mouth daily.    [provider]  omeprazole (PRILOSEC) 20 MG capsule Take 1 capsule (20 mg total) by mouth at bedtime. 03/09/16   Burnard Hawthorne, FNP  potassium chloride SA (K-DUR,KLOR-CON) 20 MEQ tablet Take 2 tablets (40 mEq total) by mouth 2 (two) times daily. 06/02/16   Demetrios Loll, MD  predniSONE (DELTASONE) 10 MG  tablet Take 10 mg by mouth daily with breakfast. Until seen by dermatologist    [provider]  ramipril (ALTACE) 2.5 MG capsule Take 1 capsule (2.5 mg total) by mouth daily. 06/03/16   Demetrios Loll, MD  torsemide (DEMADEX) 20 MG tablet Take 2 tablets (40 mg total) by mouth 2 (two) times daily. 07/05/16 08/04/16  Dustin Flock, MD  Wound Dressings (SILVASORB) GEL Apply 1 application topically 2 (two) times daily as needed. 07/05/16   Dustin Flock, MD      PHYSICAL EXAMINATION:   VITAL SIGNS: Blood pressure 115/62, pulse 97, temperature 98.3 F (36.8 C), temperature source Oral, resp. rate (!) 22, weight 123.8 kg (273 lb), SpO2 91 %.  GENERAL:  71 y.o.-year-old patient lying in the bed with no acute distress, Coughing intermittently, rattling in the chest.  EYES: Pupils equal, round, reactive to light and accommodation. No scleral icterus. Extraocular muscles intact.  HEENT: Head atraumatic, normocephalic. Oropharynx and nasopharynx clear.  NECK:  Supple, no jugular venous distention. No thyroid enlargement, no tenderness.  LUNGS: Markedly diminished breath sounds bilaterally, scattered  basilar wheezing, few rales,rhonchi , but no crepitations bilaterally. Intermittent use of accessory muscles of respiration, with speech and cough.  CARDIOVASCULAR: S1, S2 normal. 4/6 systolic murmur in aortic auscultation side, no rubs, or gallops.  ABDOMEN: Soft, nontender, nondistended. Bowel sounds present. No organomegaly or mass.  EXTREMITIES:  1+ lower extremity and pedal edema, no cyanosis, or clubbing.  NEUROLOGIC: Cranial nerves II through XII are intact. Muscle strength 5/5 in all extremities. Sensation intact. Gait not checked.  PSYCHIATRIC: The patient is alert and oriented x 3.  SKIN: No obvious rash, lesion, or ulcer.   LABORATORY PANEL:   CBC  Recent Labs Lab 06/30/16 1720 07/01/16 0333 07/02/16 0405 07/05/16 0537 07/07/16 1329  WBC 7.0 6.2 9.0 8.3 10.5  HGB 9.7* 9.1* 8.5* 8.8* 9.9*  HCT 29.9* 28.3* 27.0* 27.0* 30.2*  PLT 152 128* 119* 123* 186  MCV 95.6 97.6 95.7 95.1 94.6  MCH 31.1 31.4 30.0 31.0 31.1  MCHC 32.5 32.2 31.4* 32.6 32.9  RDW 17.7* 17.5* 17.3* 19.0* 20.0*  LYMPHSABS 0.4*  --  1.2  --  0.7*  MONOABS 0.4  --  0.5  --  0.7  EOSABS 0.0  --  0.0  --  0.0  BASOSABS 0.0  --  0.0  --  0.0   ------------------------------------------------------------------------------------------------------------------  Chemistries   Recent Labs Lab 06/30/16 1736  07/07/16 1329  NA  --   < > 132*  K  --   < > 4.3  CL  --   < > 89*  CO2  --   < > 32  GLUCOSE  --   < > 458*  BUN  --   < > 13  CREATININE  --   < > 1.03*  CALCIUM  --   < > 8.9  MG 2.5*  --   --   AST  --   --  65*  ALT  --   --  34  ALKPHOS  --   --  151*  BILITOT  --   --  2.4*  < > = values in this interval not displayed. ------------------------------------------------------------------------------------------------------------------  Cardiac Enzymes  Recent Labs Lab 06/30/16 1720 07/01/16 0333  TROPONINI 0.04* 0.03*    ------------------------------------------------------------------------------------------------------------------  RADIOLOGY: Dg Chest Port 1 View  Result Date: 07/07/2016 CLINICAL DATA:  Decreased oxygen saturation on 6 L oxygen today. EXAM: PORTABLE CHEST 1 VIEW COMPARISON:  Single-view  of the chest 07/02/2016 and 06/30/2016. FINDINGS: There is cardiomegaly. Small left pleural effusion is decreased since the prior examination with mild left basilar atelectasis noted. Right lung is clear. Aortic atherosclerosis is seen. IMPRESSION: Cardiomegaly without edema. Decreased left pleural effusion and basilar atelectasis. Electronically Signed   By: Inge Rise M.D.   On: 07/07/2016 13:57    EKG: Orders placed or performed during the hospital encounter of 07/07/16  . ED EKG 12-Lead  . ED EKG 12-Lead  EKG in emergency room revealed sinus rhythm at 99 bpm, normal axis, no acute ST-T changes, old inferior and anterior infarcts  IMPRESSION AND PLAN:  Active Problems:   Acute on chronic respiratory failure with hypoxia (HCC)   COPD with acute exacerbation (HCC)   Lactic acidosis   Acute renal insufficiency  #1 Acute on chronic respiratory failure with hypoxia, likely due to COPD exacerbation, admit patient to medical floor for observation, continue oxygen therapy, wean as tolerated, continue steroids, nebulizing therapy, improving Chest x-ray #2. COPD exacerbation, continue steroids, nebulizing therapy, Humibid, follow clinically, weaning off oxygen as tolerated, get sputum culture, if possible #3. Lactic acidosis, continue IV fluids at lower rate, following lactic acid level, suspect hypoxia related, doubt infection #4 acute renal insufficiency, follow with therapy in the morning, getting urinalysis and urine cultures All the records are reviewed and case discussed with ED provider. Management plans discussed with the patient, family and they are in agreement.  CODE STATUS: Code Status  History    Date Active Date Inactive Code Status Order ID Comments User Context   06/30/2016 10:36 PM 07/05/2016  7:13 PM Full Code [id number]  Demetrios Loll, MD Inpatient   06/23/2016  1:12 AM 06/25/2016  2:20 PM Full Code [id number]  Hugelmeyer, [place], DO Inpatient   05/31/2016  1:26 AM 06/02/2016  8:27 PM Full Code [id number]  Max Sane, MD Inpatient   12/10/2015  4:55 PM 12/14/2015  9:13 PM Full Code [id number]  Hillary Bow, MD ED   03/13/2015  7:03 PM 03/15/2015  6:55 PM Full Code [id number]  [name], MD Inpatient   07/24/2014  5:53 PM 07/25/2014  6:55 PM Full Code [id number]  Algernon Huxley, MD Inpatient       TOTAL TIME TAKING CARE OF THIS PATIENT: 50 minutes.    Theodoro Grist M.D on 07/07/2016 at 3:51 PM  Between 7am to 6pm - Pager - [phone] After 6pm go to [email or web address] - password [email or web address] [facility]  Office  [phone]  CC: Primary care physician; Burnard Hawthorne, FNP

## 2016-07-07 NOTE — Progress Notes (Signed)
Subjective:    Patient ID: [name], female    DOB: [birth year], 71 y.o.   MRN: [id number]  CC: [name] is a 71 y.o. female who presents today for follow up.   HPI: Hospital follow up.   She was called for TCM 07-06-2016  She was discharged 07/05/16.     Admitted again 06/30/16 until 07/05/16 for severe aortic stenosis, acute respiratory failure and sepsis. During course she admitted to ICU on Bipap ; hypotensive initially. RLL PNA, left pleural effusion. Treated with antibiotics and diuresis.   COPD- wearing 3 L at home. Doing nebulizer at home every 6 hours. Continues to have productive cough. Sa02 86 at home. sa02 at office visit yesteday 90%.   Follow up with cardiology for aortic stenosis.   RUQ showed calcified stone at neck; no acute cholecystitis   Elevated troponin. Due to demanding ischemia. Continue aspirin and statin.  Last CXR 5/5 showed   Stable small left pleural effusion and left lung base atelectasis versus infection. 2. No new cardiopulmonary abnormality. 3.  Calcified aortic atherosclerosis.  Started keflex for bullous dermatosis;    Palliative medicine consult while in hospital  Home 02.  Nebulizer machine  Heart failure clinic 07/06/2016.  cardiologist Clayborn Bigness) 07/12/16  Prior hospitalizations:   Admitted 4/25 for  elevated bilirubin, h/o gallstones, AKI, acute hypoxic respiratory failure secondary to PNA.  Presented with weakness. Prior had been hospitalized 4/2 for weakness. She was hypotensive and o2 sat 86% on room air.   PNA- suspect left sided. Last cxr 4/27 slightly improved aeration of left lung, central vascular congestion. Received IV maxipine. Blood cultures negative.   RUQ US shows calcified stone at neck  AKI- resumed altace; Crt 4/27 0.99  Respiratory failure- continue toresmide upon d/c  Elevated troponin- 0.03  Anemia- continue iron  chf- last echo 05/2016 EF 60-65%  Insurance denies rehab; D/ced home with  [facility]  06/02/16 - hospitalization Acute on chronic diastolic CHF, NYHA class III She is treated with iv Lasix 40 twice a day. Change back to torsemide 40 mg po bid. Systolic function was normal. The estimated ejection fraction was in the range of 60% to 65% per echo. Per Dr. Ubaldo Glassing, cardiology consultation, agree with careful diuresis follow renal function. Will need k repleated. Will consider addition of low dose beta blockers. Would not increase lisinopril due to fixed cardiac output.         HISTORY:  Past Medical History:  Diagnosis Date  . Anemia   . Asthma   . CHF (congestive heart failure) ([place])   . CVA (cerebral vascular accident) ([place])   . Diabetes mellitus without complication ([place])   . GERD (gastroesophageal reflux disease)   . Heart murmur   . History of hiatal hernia   . Hypertension   . Panic attacks   . Peripheral vascular disease ([place])   . Restless leg syndrome   . Shortness of breath dyspnea    Past Surgical History:  Procedure Laterality Date  . ABDOMINAL HYSTERECTOMY    . BREAST BIOPSY Right yrs ago   benign  . ENDARTERECTOMY Right 07/24/2014   Procedure: ENDARTERECTOMY CAROTID;  Surgeon: Algernon Huxley, MD;  Location: [facility] ORS;  Service: Vascular;  Laterality: Right;  . EYE SURGERY Left    cataract  . FRACTURE SURGERY Left    fractured ankle  . TONSILLECTOMY     Family History  Problem Relation Age of Onset  . Pancreatic cancer Mother   .  CAD Father   . Hypertension Father   . Pancreatic cancer Father   . Colon cancer Father   . CAD Brother   . Heart disease Brother        Heart attack  . Breast cancer Maternal Aunt        pt states several maternal aunts    Allergies: Codeine and Liraglutide Current Outpatient Prescriptions on File Prior to Visit  Medication Sig Dispense Refill  . acetaminophen (TYLENOL) 325 MG tablet Take 2 tablets (650 mg total) by mouth every 6 (six) hours as needed for mild pain (or Fever >/= 101).    Marland Kitchen albuterol (PROVENTIL)  (2.5 MG/3ML) 0.083% nebulizer solution Take 3 mLs (2.5 mg total) by nebulization every 4 (four) hours as needed for wheezing. 75 mL 12  . Amino Acids-Protein Hydrolys (FEEDING SUPPLEMENT, PRO-STAT SUGAR FREE 64,) LIQD Take 30 mLs by mouth. 30 ml daily for 30 days    . aspirin EC 81 MG tablet Take 81 mg by mouth daily.    . benzonatate (TESSALON) 100 MG capsule Take 1 capsule (100 mg total) by mouth 3 (three) times daily as needed for cough. 20 capsule 0  . cephALEXin (KEFLEX) 500 MG capsule Take 1 capsule (500 mg total) by mouth every 12 (twelve) hours. 8 capsule 0  . Cholecalciferol 1000 units capsule Take 1,000 Units by mouth daily.    . Cyanocobalamin (B-12) 1000 MCG/ML KIT Inject 1,000 mcg as directed every 30 (thirty) days.    . dapsone 100 MG tablet Take 150 mg by mouth daily.     . ferrous sulfate 325 (65 FE) MG tablet Take 1 tablet (325 mg total) by mouth daily with breakfast. 30 tablet 3  . folic acid (FOLVITE) 1 MG tablet Take 1 mg by mouth daily. Take one pill daily on Tuesday, Wednesday, Thursday, Friday, Saturday and Sunday. These are days were patient isn't taking methotrexate    . gabapentin (NEURONTIN) 800 MG tablet Take 800 mg by mouth 2 (two) times daily.    . hydrochlorothiazide (HYDRODIURIL) 12.5 MG tablet Take 12.5 mg by mouth 2 (two) times daily.     . insulin aspart (NOVOLOG) 100 UNIT/ML injection Inject 10 Units into the skin 3 (three) times daily before meals. 301-350+ 10 units, 351 or higher NOTIFY MD    . insulin glargine (LANTUS) 100 UNIT/ML injection Inject 0.2 mLs (20 Units total) into the skin at bedtime. 10 mL 11  . lovastatin (MEVACOR) 20 MG tablet Take 20 mg by mouth at bedtime.    . magnesium oxide (MAG-OX) 400 (241.3 Mg) MG tablet Take 1 tablet (400 mg total) by mouth 2 (two) times daily. 14 tablet 0  . methotrexate (RHEUMATREX) 2.5 MG tablet Take 10 mg by mouth once a week. Patient takes on Monday. Caution:Chemotherapy. Protect from light.    . Multiple  Vitamins-Minerals (MULTIVITAL) tablet Take 1 tablet by mouth daily.    Marland Kitchen omeprazole (PRILOSEC) 20 MG capsule Take 1 capsule (20 mg total) by mouth at bedtime. 90 capsule 3  . potassium chloride SA (K-DUR,KLOR-CON) 20 MEQ tablet Take 2 tablets (40 mEq total) by mouth 2 (two) times daily. 14 tablet 0  . predniSONE (DELTASONE) 10 MG tablet Take 10 mg by mouth daily with breakfast. Until seen by dermatologist    . ramipril (ALTACE) 2.5 MG capsule Take 1 capsule (2.5 mg total) by mouth daily.    Marland Kitchen torsemide (DEMADEX) 20 MG tablet Take 2 tablets (40 mg total) by mouth 2 (two)  times daily. 120 tablet 3  . Wound Dressings (SILVASORB) GEL Apply 1 application topically 2 (two) times daily as needed. 1 Tube 6   No current facility-administered medications on file prior to visit.     Social History  Substance Use Topics  . Smoking status: Never Smoker  . Smokeless tobacco: Never Used  . Alcohol use No    Review of Systems  Constitutional: Negative for chills and fever.  Respiratory: Negative for cough.   Cardiovascular: Negative for chest pain and palpitations.  Gastrointestinal: Negative for nausea and vomiting.      Objective:    BP (!) 108/58   Pulse 88   Temp 98 F (36.7 C) (Oral)   Ht 5' 2" (1.575 m)   Wt 273 lb 6.4 oz (124 kg) Comment: While in wheel chair  SpO2 (!) 87% Comment: on 3L o2  BMI 50.01 kg/m  BP Readings from Last 3 Encounters:  07/07/16 (!) 108/58  07/06/16 (!) 102/45  07/05/16 (!) 110/45   Wt Readings from Last 3 Encounters:  07/07/16 273 lb 6.4 oz (124 kg)  07/06/16 218 lb 8 oz (99.1 kg)  06/30/16 213 lb 10 oz (96.9 kg)    Physical Exam  Constitutional: She appears well-developed and well-nourished.  Eyes: Conjunctivae are normal.  Cardiovascular: Normal rate, regular rhythm, normal heart sounds and normal pulses.   Pulmonary/Chest: Effort normal and breath sounds normal. She has no wheezes. She has no rhonchi. She has no rales.  Neurological: She is  alert.  Skin: Skin is warm and dry.  Psychiatric: She has a normal mood and affect. Her speech is normal and behavior is normal. Thought content normal.  Vitals reviewed.      Assessment & Plan:   Problem List Items Addressed This Visit      Cardiovascular and Mediastinum   HTN (hypertension) - Primary (Chronic)    Controlled today.      Relevant Orders   POCT urinalysis dipstick   Urinalysis, Routine w reflex microscopic   CULTURE, URINE COMPREHENSIVE   Comprehensive metabolic panel   CHF (congestive heart failure) (HCC)     Respiratory   COPD (chronic obstructive pulmonary disease) (HCC)    Recent PNA, left pleural effusion. No pulmonologist- referral placed. On 6L o2, highest sa02 in room as 85%. Worry about Co2 retaining in copd. In no acute respiratory distress however was uncomfortable sending patient home with such low o2. After talking with caregivers and patient, we jointly agreed referral to back to [facility] ED was appropriate. Report given to triage. Patient felt comfortable riding with caregivers in personal vehicle.       Relevant Orders   Ambulatory referral to Pulmonology       I am having Ms. Hammond maintain her hydrochlorothiazide, lovastatin, aspirin EC, gabapentin, ferrous sulfate, acetaminophen, B-12, dapsone, omeprazole, folic acid, methotrexate, potassium chloride SA, ramipril, insulin glargine, insulin aspart, Cholecalciferol, MULTIVITAL, feeding supplement (PRO-STAT SUGAR FREE 64), predniSONE, magnesium oxide, benzonatate, SILVASORB, torsemide, cephALEXin, and albuterol.   No orders of the defined types were placed in this encounter.   Return precautions given.   Risks, benefits, and alternatives of the medications and treatment plan prescribed today were discussed, and patient expressed understanding.   Education regarding symptom management and diagnosis given to patient on AVS.  Continue to follow with Burnard Hawthorne, FNP for routine health  maintenance.   [place] and I agreed with plan.   Mable Paris, FNP   Total of 40 minutes spent  with patient, greater than 50% of which was spent in discussion of coordination of care between home health, medication reconciliation, heart failure clinic, cardiology, pulmonology ( referral placed), and dermatology.

## 2016-07-07 NOTE — Progress Notes (Signed)
Anticoagulation monitoring(Lovenox):  71 yo female ordered Lovenox 40 mg Q24h  Filed Weights   07/07/16 1309  Weight: 273 lb (123.8 kg)   BMI 50.1   Lab Results  Component Value Date   CREATININE 1.03 (H) 07/07/2016   CREATININE 0.83 07/04/2016   CREATININE 0.68 07/03/2016   Estimated Creatinine Clearance: 63.9 mL/min (A) (by C-G formula based on SCr of 1.03 mg/dL (H)). Hemoglobin & Hematocrit     Component Value Date/Time   HGB 9.9 (L) 07/07/2016 1329   HGB 12.5 12/19/2013 1903   HCT 30.2 (L) 07/07/2016 1329   HCT 40.6 12/19/2013 1903     Per Protocol for Patient with estCrcl > 30 ml/min and BMI > 40, will transition to Lovenox 40 mg Q12h.

## 2016-07-07 NOTE — Progress Notes (Signed)
Notified Dr. Winona Legato of blood glucose of 586. Per MD give 25 units of novolog and recheck sugar in 2 hours. Also per MD place order for pt to receive fluids NS at 169ml/hr for 8 hours.

## 2016-07-07 NOTE — ED Provider Notes (Signed)
[facility] Emergency Department Provider Note   ____________________________________________   First MD Initiated Contact with Patient 07/07/16 1312     (approximate)  I have reviewed the triage vital signs and the nursing notes.   HISTORY  Chief Complaint Shortness of Breath    HPI [name] is a 71 y.o. female referred here from her clinic due to low oxygen saturation on he oxygen. Patient reports she is experienced slight increased shortness breath for last 2 days, using nebulizers at home but every 6 hours. She is also noticed that she is wheezing occasionally. She continues to have a productive cough which has been present since hospitalization, she also reports that she has not gained weight or noticed any swelling.  She denies any pain. Reports that she was told her oxygen level was low and she needed to come today   No chest pain.  Past Medical History:  Diagnosis Date  . Anemia   . Asthma   . CHF (congestive heart failure) ([place])   . CVA (cerebral vascular accident) ([place])   . Diabetes mellitus without complication ([place])   . GERD (gastroesophageal reflux disease)   . Heart murmur   . History of hiatal hernia   . Hypertension   . Panic attacks   . Peripheral vascular disease ([place])   . Restless leg syndrome   . Shortness of breath dyspnea     Patient Active Problem List   Diagnosis Date Noted  . COPD (chronic obstructive pulmonary disease) ([place]) 07/07/2016  . Acute on chronic respiratory failure with hypoxia ([place]) 07/07/2016  . COPD with acute exacerbation ([place]) 07/07/2016  . Lactic acidosis 07/07/2016  . Acute renal insufficiency 07/07/2016  . HTN (hypertension) 07/06/2016  . Advance care planning   . Palliative care by specialist   . Cholelithiasis 06/27/2016  . Aortic stenosis 06/27/2016  . Sepsis ([place]) 06/22/2016  . Pain of upper abdomen 03/10/2016  . Morbid obesity ([place]) 02/02/2016  . Benign hypertensive heart  disease with CHF (congestive heart failure) ([place]) 01/06/2016  . Goals of care, counseling/discussion 12/30/2015  . Recurrent falls 12/13/2015  . Fatty liver 12/10/2015  . Fracture of multiple ribs 11/25/2015  . B12 deficiency anemia 11/03/2015  . GERD (gastroesophageal reflux disease) 10/17/2015  . Pure hypercholesterolemia 10/17/2015  . Uncontrolled type 2 diabetes mellitus with diabetic neuropathy, with long-term current use of insulin ([place]) 10/05/2015  . Depression 10/05/2015  . 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 CHF (congestive heart failure) ([place]) 03/13/2015  . Carotid stenosis 07/24/2014    Past Surgical History:  Procedure Laterality Date  . ABDOMINAL HYSTERECTOMY    . BREAST BIOPSY Right yrs ago   benign  . ENDARTERECTOMY Right 07/24/2014   Procedure: ENDARTERECTOMY CAROTID;  Surgeon: Algernon Huxley, MD;  Location: [facility] ORS;  Service: Vascular;  Laterality: Right;  . EYE SURGERY Left    cataract  . FRACTURE SURGERY Left    fractured ankle  . TONSILLECTOMY      Prior to Admission medications   Medication Sig Start Date End Date Taking? Authorizing Provider  acetaminophen (TYLENOL) 325 MG tablet Take 2 tablets (650 mg total) by mouth every 6 (six) hours as needed for mild pain (or Fever >/= 101). 12/14/15   Gouru, Illene Silver, MD  albuterol (PROVENTIL) (2.5 MG/3ML) 0.083% nebulizer solution Take 3 mLs (2.5 mg total) by nebulization every 4 (four) hours as needed for wheezing. 07/05/16   Dustin Flock, MD  Amino Acids-Protein Hydrolys (FEEDING SUPPLEMENT, PRO-STAT  SUGAR FREE 64,) LIQD Take 30 mLs by mouth. 30 ml daily for 30 days 06/16/16   [provider]  aspirin EC 81 MG tablet Take 81 mg by mouth daily.    [provider]  benzonatate (TESSALON) 100 MG capsule Take 1 capsule (100 mg total) by mouth 3 (three) times daily as needed for cough. 07/05/16   Dustin Flock, MD  cephALEXin (KEFLEX) 500 MG capsule Take 1 capsule (500 mg total) by mouth every 12  (twelve) hours. 07/05/16 07/09/16  Dustin Flock, MD  Cholecalciferol 1000 units capsule Take 1,000 Units by mouth daily.    [provider]  Cyanocobalamin (B-12) 1000 MCG/ML KIT Inject 1,000 mcg as directed every 30 (thirty) days. 02/02/16   [provider]  dapsone 100 MG tablet Take 150 mg by mouth daily.     [provider]  ferrous sulfate 325 (65 FE) MG tablet Take 1 tablet (325 mg total) by mouth daily with breakfast. 11/03/15   Burnard Hawthorne, FNP  folic acid (FOLVITE) 1 MG tablet Take 1 mg by mouth daily. Take one pill daily on Tuesday, Wednesday, Thursday, Friday, Saturday and Sunday. These are days were patient isn't taking methotrexate    [provider]  gabapentin (NEURONTIN) 800 MG tablet Take 800 mg by mouth 2 (two) times daily.    [provider]  hydrochlorothiazide (HYDRODIURIL) 12.5 MG tablet Take 12.5 mg by mouth 2 (two) times daily.     [provider]  insulin aspart (NOVOLOG) 100 UNIT/ML injection Inject 10 Units into the skin 3 (three) times daily before meals. 301-350+ 10 units, 351 or higher NOTIFY MD    [provider]  insulin glargine (LANTUS) 100 UNIT/ML injection Inject 0.2 mLs (20 Units total) into the skin at bedtime. 06/02/16   Demetrios Loll, MD  lovastatin (MEVACOR) 20 MG tablet Take 20 mg by mouth at bedtime.    [provider]  magnesium oxide (MAG-OX) 400 (241.3 Mg) MG tablet Take 1 tablet (400 mg total) by mouth 2 (two) times daily. 06/25/16   Fritzi Mandes, MD  methotrexate (RHEUMATREX) 2.5 MG tablet Take 10 mg by mouth once a week. Patient takes on Monday. Caution:Chemotherapy. Protect from light.    [provider]  Multiple Vitamins-Minerals (MULTIVITAL) tablet Take 1 tablet by mouth daily.    [provider]  omeprazole (PRILOSEC) 20 MG capsule Take 1 capsule (20 mg total) by mouth at bedtime. 03/09/16   Burnard Hawthorne, FNP  potassium chloride SA (K-DUR,KLOR-CON) 20 MEQ  tablet Take 2 tablets (40 mEq total) by mouth 2 (two) times daily. 06/02/16   Demetrios Loll, MD  predniSONE (DELTASONE) 10 MG tablet Take 10 mg by mouth daily with breakfast. Until seen by dermatologist    [provider]  ramipril (ALTACE) 2.5 MG capsule Take 1 capsule (2.5 mg total) by mouth daily. 06/03/16   Demetrios Loll, MD  torsemide (DEMADEX) 20 MG tablet Take 2 tablets (40 mg total) by mouth 2 (two) times daily. 07/05/16 08/04/16  Dustin Flock, MD  Wound Dressings (SILVASORB) GEL Apply 1 application topically 2 (two) times daily as needed. 07/05/16   Dustin Flock, MD    Allergies Codeine and Liraglutide  Family History  Problem Relation Age of Onset  . Pancreatic cancer Mother   . CAD Father   . Hypertension Father   . Pancreatic cancer Father   . Colon cancer Father   . CAD Brother   . Heart disease Brother  Heart attack  . Breast cancer Maternal Aunt        pt states several maternal aunts    Social History Social History  Substance Use Topics  . Smoking status: Never Smoker  . Smokeless tobacco: Never Used  . Alcohol use No    Review of Systems Constitutional: No fever/chills Eyes: No visual changes. ENT: No sore throat. Cardiovascular: Denies chest pain. Respiratory: See history of present illness Gastrointestinal: No abdominal pain.  No nausea, no vomiting.  No diarrhea.  No constipation. Genitourinary: Negative for dysuria. Musculoskeletal: Negative for back pain. Skin: Negative for rash. Neurological: Negative for headaches, focal weakness or numbness.  10-point ROS otherwise negative.  ____________________________________________   PHYSICAL EXAM:  VITAL SIGNS: ED Triage Vitals  Enc Vitals Group     BP 07/07/16 1311 (!) 158/69     Pulse Rate 07/07/16 1310 (!) 109     Resp 07/07/16 1310 (!) 32     Temp 07/07/16 1311 98.3 F (36.8 C)     Temp Source 07/07/16 1311 Oral     SpO2 07/07/16 1310 (!) 83 %     Weight 07/07/16 1309 273 lb (123.8  kg)     Height --      Head Circumference --      Peak Flow --      Pain Score --      Pain Loc --      Pain Edu? --      Excl. in [place]? --     Constitutional: Alert and oriented. Very pleasant, but moderately dyspneic with increased respiratory rate and mild accessory muscle use Eyes: Conjunctivae are normal. PERRL. EOMI. Head: Atraumatic. Nose: No congestion/rhinnorhea. Mouth/Throat: Mucous membranes are moist.  Oropharynx non-erythematous. Neck: No stridor.   Cardiovascular: Normal rate, regular rhythm. Grossly normal heart sounds.  Good peripheral circulation. Respiratory: Slightly tachypnea. Mild retractions, notable rhonchi centrally with scattered end expiratory wheezing throughout. Speaks in phrases in short sentences. Saturation about 90% on 6 L nasal cannula. Gastrointestinal: Soft and nontender. No distention.  Musculoskeletal: No lower extremity tenderness nor edema.  No joint effusions. Neurologic:  Normal speech and language. No gross focal neurologic deficits are appreciated. Skin:  Skin is warm, dry and intact. No rash noted. Psychiatric: Mood and affect are normal. Speech and behavior are normal. She and her caretakers are very pleasant.  ____________________________________________   LABS (all labs ordered are listed, but only abnormal results are displayed)  Labs Reviewed  COMPREHENSIVE METABOLIC PANEL - Abnormal; Notable for the following:       Result Value   Sodium 132 (*)    Chloride 89 (*)    Glucose, Bld 458 (*)    Creatinine, Ser 1.03 (*)    Albumin 3.2 (*)    AST 65 (*)    Alkaline Phosphatase 151 (*)    Total Bilirubin 2.4 (*)    GFR calc non Af Amer 54 (*)    All other components within normal limits  CBC WITH DIFFERENTIAL/PLATELET - Abnormal; Notable for the following:    RBC 3.20 (*)    Hemoglobin 9.9 (*)    HCT 30.2 (*)    RDW 20.0 (*)    Neutro Abs 9.1 (*)    Lymphs Abs 0.7 (*)    All other components within normal limits  LACTIC ACID,  PLASMA - Abnormal; Notable for the following:    Lactic Acid, Venous 4.4 (*)    All other components within normal limits  CULTURE, BLOOD (ROUTINE  X 2)  CULTURE, BLOOD (ROUTINE X 2)  BRAIN NATRIURETIC PEPTIDE  URINALYSIS, ROUTINE W REFLEX MICROSCOPIC  LACTIC ACID, PLASMA   ____________________________________________  EKG  Reviewed and interpreted by me at 1320 Heart rate 100 QRS 80 QTc 470 Normal sinus rhythm, 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 No evidence of acute ST elevation ____________________________________________  RADIOLOGY  Dg Chest Port 1 View  Result Date: 07/07/2016 CLINICAL DATA:  Decreased oxygen saturation on 6 L oxygen today. EXAM: PORTABLE CHEST 1 VIEW COMPARISON:  Single-view of the chest 07/02/2016 and 06/30/2016. FINDINGS: There is cardiomegaly. Small left pleural effusion is decreased since the prior examination with mild left basilar atelectasis noted. Right lung is clear. Aortic atherosclerosis is seen. IMPRESSION: Cardiomegaly without edema. Decreased left pleural effusion and basilar atelectasis. Electronically Signed   By: Inge Rise M.D.   On: 07/07/2016 13:57    ____________________________________________   PROCEDURES  Procedure(s) performed: None  Procedures  Critical Care performed: No  ____________________________________________   INITIAL IMPRESSION / ASSESSMENT AND PLAN / ED COURSE  Pertinent labs & imaging results that were available during my care of the patient were reviewed by me and considered in my medical decision making (see chart for details).  Chest x-ray appears improved. No pleuritic chest pain or cardiac symptoms are associated.  Broad differential for dyspnea in this patient, of note she denies weight gain and her BNP is normal or urine against acute CHF. She does have a recent infection and pneumonia, however denies any fever, is not have significant leukocytosis, and generally denies infectious symptoms  however I will continue her on broad-spectrum antibiotic given the worsening hypoxia. She also has end expiratory wheezing and history of COPD, suspect this may be driving her dyspnea today. Her lactate was elevated, and a code sepsis was paged however I discussed the case with the hospitalist service and admitted agreement that given her hemodynamic stability, lab work, and history of CHF plus recent that aggressive antibiotic therapy that it is reasonable to withhold full fluid bolusing of 30 ML's per kilo as not to overload the patient.  ----------------------------------------- 4:12 PM on 07/07/2016 -----------------------------------------  The patient is improving. Lungs have cleared somewhat, wheezing is less evident now. She is breathing more comfortably, and reports improvement. Patient will be admitted for further workup on the hospitalist service.      ____________________________________________   FINAL CLINICAL IMPRESSION(S) / ED DIAGNOSES  Final diagnoses:  Acute and chr resp failure, unsp w hypoxia or hypercapnia (HCC)  COPD exacerbation (HCC)      NEW MEDICATIONS STARTED DURING THIS VISIT:  New Prescriptions   No medications on file     Note:  This document was prepared using Dragon voice recognition software and may include unintentional dictation errors.     Delman Kitten, MD 07/07/16 [phone]

## 2016-07-07 NOTE — Progress Notes (Signed)
Pre visit review using our clinic review tool, if applicable. No additional management support is needed unless otherwise documented below in the visit note. 

## 2016-07-07 NOTE — Progress Notes (Signed)
71 y/o F with recent treatment for PNA admitted to ED with O2 sats 86% on 6 L. Code sepsis called but no antibiotics ordered yet. After discussion with Dr. Fanny Bien, will continue to evaluate patient to determine the need for abx.   Luisa Hart, PharmD Clinical Pharmacist

## 2016-07-07 NOTE — Progress Notes (Signed)
Notified Dr. Emmit [name] of blood glucose oover 500. Per MD give the 5 units of sliding scale bedtime coverage along with lantus and recheck in 1 hour

## 2016-07-07 NOTE — ED Notes (Signed)
Bolus of NS was stopped prior to being started by the hospitalist and reorder as NS 12ml/hr.

## 2016-07-08 ENCOUNTER — Observation Stay: Payer: Medicare Other

## 2016-07-08 ENCOUNTER — Telehealth: Payer: Self-pay | Admitting: *Deleted

## 2016-07-08 DIAGNOSIS — R778 Other specified abnormalities of plasma proteins: Secondary | ICD-10-CM

## 2016-07-08 DIAGNOSIS — L8990 Pressure ulcer of unspecified site, unspecified stage: Secondary | ICD-10-CM | POA: Insufficient documentation

## 2016-07-08 DIAGNOSIS — R7989 Other specified abnormal findings of blood chemistry: Secondary | ICD-10-CM

## 2016-07-08 LAB — PROCALCITONIN: Procalcitonin: 0.25 ng/mL

## 2016-07-08 LAB — CBC
HCT: 27 % — ABNORMAL LOW (ref 35.0–47.0)
Hemoglobin: 9.1 g/dL — ABNORMAL LOW (ref 12.0–16.0)
MCH: 31.9 pg (ref 26.0–34.0)
MCHC: 33.7 g/dL (ref 32.0–36.0)
MCV: 94.5 fL (ref 80.0–100.0)
PLATELETS: 161 10*3/uL (ref 150–440)
RBC: 2.85 MIL/uL — AB (ref 3.80–5.20)
RDW: 19.7 % — AB (ref 11.5–14.5)
WBC: 8.7 10*3/uL (ref 3.6–11.0)

## 2016-07-08 LAB — BASIC METABOLIC PANEL
Anion gap: 10 (ref 5–15)
BUN: 14 mg/dL (ref 6–20)
CALCIUM: 8.2 mg/dL — AB (ref 8.9–10.3)
CO2: 35 mmol/L — ABNORMAL HIGH (ref 22–32)
CREATININE: 0.73 mg/dL (ref 0.44–1.00)
Chloride: 90 mmol/L — ABNORMAL LOW (ref 101–111)
Glucose, Bld: 166 mg/dL — ABNORMAL HIGH (ref 65–99)
Potassium: 3.3 mmol/L — ABNORMAL LOW (ref 3.5–5.1)
SODIUM: 135 mmol/L (ref 135–145)

## 2016-07-08 LAB — TROPONIN I
TROPONIN I: 0.03 ng/mL — AB (ref <0.03)
Troponin I: <0.03 ng/mL (ref <0.03)
Troponin I: <0.03 ng/mL (ref <0.03)

## 2016-07-08 LAB — GLUCOSE, CAPILLARY
GLUCOSE-CAPILLARY: 181 mg/dL — AB (ref 65–99)
GLUCOSE-CAPILLARY: 228 mg/dL — AB (ref 65–99)
Glucose-Capillary: 138 mg/dL — ABNORMAL HIGH (ref 65–99)
Glucose-Capillary: 254 mg/dL — ABNORMAL HIGH (ref 65–99)
Glucose-Capillary: 310 mg/dL — ABNORMAL HIGH (ref 65–99)

## 2016-07-08 LAB — LACTIC ACID, PLASMA
LACTIC ACID, VENOUS: 2.3 mmol/L — AB (ref 0.5–1.9)
LACTIC ACID, VENOUS: 1.6 mmol/L (ref 0.5–1.9)

## 2016-07-08 MED ORDER — PREDNISONE 50 MG PO TABS
50.0000 mg | ORAL_TABLET | Freq: Every day | ORAL | Status: DC
  Administered 2016-07-08 – 2016-07-09 (×2): 50 mg via ORAL
  Filled 2016-07-08 (×2): qty 1

## 2016-07-08 MED ORDER — TIOTROPIUM BROMIDE MONOHYDRATE 18 MCG IN CAPS: 18.0000 ug | ORAL_CAPSULE | Freq: Every day | RESPIRATORY_TRACT | 12 refills | Status: AC

## 2016-07-08 MED ORDER — GUAIFENESIN ER 600 MG PO TB12: 600.0000 mg | ORAL_TABLET | Freq: Two times a day (BID) | ORAL | 0 refills | Status: DC

## 2016-07-08 MED ORDER — PRO-STAT SUGAR FREE PO LIQD: 30.0000 mL | Freq: Every day | ORAL | 0 refills | Status: AC

## 2016-07-08 NOTE — Telephone Encounter (Signed)
[facility] has requested a referral for Palliative care, patient is currently admitted at [facility], family has been advised to use palliative home health care. Debra requested to have the form for palliative care filled out by the PCP, and returned if PCP agrees.     Contact Stanton Kidney [phone]

## 2016-07-08 NOTE — Progress Notes (Signed)
Notified MD of troponin critical lab; no new orders; Will continue to monitor

## 2016-07-08 NOTE — Care Management Obs Status (Signed)
MEDICARE OBSERVATION STATUS NOTIFICATION   Patient Details  Name: [name] MRN: [id number] Date of Birth: 02/21/1946   Medicare Observation Status Notification Given:  Yes    Chapman Fitch, RN 07/08/2016, 2:30 PM

## 2016-07-08 NOTE — Care Management (Signed)
Patient Was discharged 07/05/16 with HRI services for PT and RN through [facility]. Barbara Cower with [facility] notified.  Referral for Home Palliative was also made at that time.  Clydie Braun with Palliative also notified of admission.  Patient has chronic home O2 and nebulizer through [facility].  Barbara Cower has delivered a portable tank in anticipation of discharge.

## 2016-07-08 NOTE — Progress Notes (Signed)
Patient has a pending HOME PALLIATIVE referral. Shew as readmitted prior to home consult. CMRN Judeth Cornfield made aware. Updated notes faxed to referral. Thank you. Dayna Barker Rn, BSN, [facility] [place], [facility] [phone] c

## 2016-07-08 NOTE — Progress Notes (Signed)
Inpatient Diabetes Program Recommendations  AACE/ADA: New Consensus Statement on Inpatient Glycemic Control (2015)  Target Ranges:  Prepandial:   less than 140 mg/dL      Peak postprandial:   less than 180 mg/dL (1-2 hours)      Critically ill patients:  140 - 180 mg/dL   Lab Results  Component Value Date   GLUCAP 181 (H) 07/08/2016   HGBA1C 5.5 05/30/2016    Review of Glycemic Control  Results for [name], GRUWELL (MRN [id number]) as of 07/08/2016 08:43  Ref. Range 07/07/2016 17:45 07/07/2016 19:59 07/07/2016 21:56 07/08/2016 00:18 07/08/2016 07:44  Glucose-Capillary Latest Ref Range: 65 - 99 mg/dL 650 (HH) >354 (HH) 656 (HH) 310 (H) 181 (H)    Diabetes history: Type 2 Outpatient Diabetes medications: Novolog 10 units tid, Lantus 20 units qday   Current orders for Inpatient glycemic control: Lantus 20 units qhs, Novolog 6 units tid, Novolog 0-20 units tid, Novolog 0-5 units qhs  Inpatient Diabetes Program Recommendations:   Agree with current medications for blood sugar management.  Susette Racer, RN, BA, MHA, CDE Diabetes Coordinator Inpatient Diabetes Program  [phone] (Team Pager) [phone] [facility] Office) 07/08/2016 8:45 AM

## 2016-07-08 NOTE — Telephone Encounter (Signed)
Please advise 

## 2016-07-08 NOTE — Progress Notes (Signed)
[facility] -  at [facility]   PATIENT NAME: Kalianna Verbeke    MR#:  [id number]  DATE OF BIRTH:  10/31/1945  SUBJECTIVE:  CHIEF COMPLAINT:   Chief Complaint  Patient presents with  . Shortness of Breath  The patient is 71 year old Caucasian female with past medical history significant for history of recent admission for pneumonia, who presents to the hospital with complaints of hypoxia, noted by her primary care physician, shortness of breath, wheezing. O2 sats were 84% on 3 L in the PCPs office. Patient's chest x-ray in emergency room revealed improvement of pneumonia and small pleural effusion. Lactic acid level found elevated, patient was given IV fluids and admitted to the hospital. She feels comfortable today, denies any significant phlegm production. Wants to go home. Unable to wean off oxygen, remained at 4 liters per nasal cannulas, hypoxic if weaned down to 3.  Review of Systems  Constitutional: Negative for chills, fever and weight loss.  HENT: Negative for congestion.   Eyes: Negative for blurred vision and double vision.  Respiratory: Positive for cough and shortness of breath. Negative for sputum production and wheezing.   Cardiovascular: Negative for chest pain, palpitations, orthopnea, leg swelling and PND.  Gastrointestinal: Negative for abdominal pain, blood in stool, constipation, diarrhea, nausea and vomiting.  Genitourinary: Negative for dysuria, frequency, hematuria and urgency.  Musculoskeletal: Negative for falls.  Neurological: Negative for dizziness, tremors, focal weakness and headaches.  Endo/Heme/Allergies: Does not bruise/bleed easily.  Psychiatric/Behavioral: Negative for depression. The patient does not have insomnia.     VITAL SIGNS: Blood pressure 138/70, pulse 79, temperature 97.3 F (36.3 C), temperature source Oral, resp. rate 20, height 5\' 2"  (1.575 m), weight 99.7 kg (219 lb 12.8 oz), SpO2 90 %.  PHYSICAL  EXAMINATION:   GENERAL:  71 y.o.-year-old patient lying in the bed with no acute distress.  EYES: Pupils equal, round, reactive to light and accommodation. No scleral icterus. Extraocular muscles intact.  HEENT: Head atraumatic, normocephalic. Oropharynx and nasopharynx clear.  NECK:  Supple, no jugular venous distention. No thyroid enlargement, no tenderness.  LUNGS: Normal breath sounds bilaterally, no wheezing, rales,rhonchi , crepitations bilateral bases. Intermittent use of accessory muscles of respiration.  CARDIOVASCULAR: S1, S2 normal. No murmurs, rubs, or gallops.  ABDOMEN: Soft, nontender, nondistended. Bowel sounds present. No organomegaly or mass.  EXTREMITIES: No pedal edema, cyanosis, or clubbing.  NEUROLOGIC: Cranial nerves II through XII are intact. Muscle strength 5/5 in all extremities. Sensation intact. Gait not checked.  PSYCHIATRIC: The patient is alert and oriented x 3.  SKIN: No obvious rash, lesion, or ulcer.   ORDERS/RESULTS REVIEWED:   CBC  Recent Labs Lab 07/02/16 0405 07/05/16 0537 07/07/16 1329 07/08/16 0502  WBC 9.0 8.3 10.5 8.7  HGB 8.5* 8.8* 9.9* 9.1*  HCT 27.0* 27.0* 30.2* 27.0*  PLT 119* 123* 186 161  MCV 95.7 95.1 94.6 94.5  MCH 30.0 31.0 31.1 31.9  MCHC 31.4* 32.6 32.9 33.7  RDW 17.3* 19.0* 20.0* 19.7*  LYMPHSABS 1.2  --  0.7*  --   MONOABS 0.5  --  0.7  --   EOSABS 0.0  --  0.0  --   BASOSABS 0.0  --  0.0  --    ------------------------------------------------------------------------------------------------------------------  Chemistries   Recent Labs Lab 07/02/16 0405 07/03/16 0334 07/04/16 0401 07/07/16 1329 07/08/16 0502  NA 130* 132* 133* 132* 135  K 4.0 3.6 3.8 4.3 3.3*  CL 96* 95* 93* 89* 90*  CO2 31 32  35* 32 35*  GLUCOSE 218* 203* 146* 458* 166*  BUN 14 12 10 13 14   CREATININE 0.65 0.68 0.83 1.03* 0.73  CALCIUM 8.5* 8.7* 8.5* 8.9 8.2*  AST  --   --   --  65*  --   ALT  --   --   --  34  --   ALKPHOS  --   --   --   151*  --   BILITOT  --   --   --  2.4*  --    ------------------------------------------------------------------------------------------------------------------ estimated creatinine clearance is 72.2 mL/min (by C-G formula based on SCr of 0.73 mg/dL). ------------------------------------------------------------------------------------------------------------------ No results for input(s): TSH, T4TOTAL, T3FREE, THYROIDAB in the last 72 hours.  Invalid input(s): FREET3  Cardiac Enzymes  Recent Labs Lab 07/07/16 2341 07/08/16 0502 07/08/16 1100  TROPONINI <0.03 0.03* <0.03   ------------------------------------------------------------------------------------------------------------------ Invalid input(s): POCBNP ---------------------------------------------------------------------------------------------------------------  RADIOLOGY: Dg Chest Port 1 View  Result Date: 07/08/2016 CLINICAL DATA:  Shortness of breath x1 day, asthma, CHF EXAM: PORTABLE CHEST 1 VIEW COMPARISON:  07/07/2016 FINDINGS: Small left pleural effusion. Pulmonary vascular congestion without frank interstitial edema. Mild left basilar atelectasis. No pneumothorax. Cardiomegaly. IMPRESSION: Small left pleural effusion with mild left basilar atelectasis. No frank interstitial edema. Electronically Signed   By: Charline Bills M.D.   On: 07/08/2016 09:52   Dg Chest Port 1 View  Result Date: 07/07/2016 CLINICAL DATA:  Decreased oxygen saturation on 6 L oxygen today. EXAM: PORTABLE CHEST 1 VIEW COMPARISON:  Single-view of the chest 07/02/2016 and 06/30/2016. FINDINGS: There is cardiomegaly. Small left pleural effusion is decreased since the prior examination with mild left basilar atelectasis noted. Right lung is clear. Aortic atherosclerosis is seen. IMPRESSION: Cardiomegaly without edema. Decreased left pleural effusion and basilar atelectasis. Electronically Signed   By: Drusilla Kanner M.D.   On: 07/07/2016 13:57     EKG:  Orders placed or performed during the hospital encounter of 07/07/16  . ED EKG 12-Lead  . ED EKG 12-Lead    ASSESSMENT AND PLAN:  Active Problems:   Acute on chronic respiratory failure with hypoxia (HCC)   COPD with acute exacerbation (HCC)   Lactic acidosis   Acute renal insufficiency   Pressure injury of skin   Elevated troponin  #1. Acute on chronic respiratory failure with hypoxia, suspected atelectasis, incentive spirometer is initiated, repeated chest x-ray did not show significant fluid retention, continue weaning off oxygen as tolerated #2. COPD exacerbation, continue Keflex, steroids, taper, follow clinically, get sputum cultures if possible #3. Lactic acidosis, patient received IV fluids. On admission, repeat lactic acid level #4 acute renal insufficiency, resolved with hydration, urinalysis was unremarkable, unlikely UTI #5. Elevated troponin, repeated troponin is normal, unlikely coronary artery disease related #6. Diabetes mellitus with severe hyperglycemia due to steroids, advanced insulin doses, continue blood glucose levels are ranging between 180 to 300s, improved since yesterday  Management plans discussed with the patient, family and they are in agreement.   DRUG ALLERGIES:  Allergies  Allergen Reactions  . Codeine Other (See Comments)    Reaction:  Dizziness   . Liraglutide     Other reaction(s): nausea and stomach pain    CODE STATUS:     Code Status Orders        Start     Ordered   07/07/16 1731  Full code  Continuous     07/07/16 1730    Code Status History    Date Active Date Inactive Code Status Order ID  Comments User Context   06/30/2016 10:36 PM 07/05/2016  7:13 PM Full Code [id number]  Shaune Pollack, MD Inpatient   06/23/2016  1:12 AM 06/25/2016  2:20 PM Full Code [id number]  Hugelmeyer, Jon Gills, DO Inpatient   05/31/2016  1:26 AM 06/02/2016  8:27 PM Full Code [id number]  Delfino Lovett, MD Inpatient   12/10/2015  4:55 PM 12/14/2015  9:13 PM  Full Code [id number]  Milagros Loll, MD ED   03/13/2015  7:03 PM 03/15/2015  6:55 PM Full Code [id number]  Altamese Dilling, MD Inpatient   07/24/2014  5:53 PM 07/25/2014  6:55 PM Full Code [id number]  Annice Needy, MD Inpatient    Advance Directive Documentation     Most Recent Value  Type of Advance Directive  Healthcare Power of Attorney, Living will  Pre-existing out of facility DNR order (yellow form or pink MOST form)  -  "MOST" Form in Place?  -      TOTAL TIME TAKING CARE OF THIS PATIENT: 40 minutes.    Katharina Caper M.D on 07/08/2016 at 3:32 PM  Between 7am to 6pm - Pager - [phone]  After 6pm go to [email or web address] - password [email or web address] [facility]  [place]  Office  [phone]  CC: Primary care physician; Allegra Grana, FNP

## 2016-07-09 LAB — URINE CULTURE: Culture: >=100000 — AB

## 2016-07-09 LAB — GLUCOSE, CAPILLARY
Glucose-Capillary: 166 mg/dL — ABNORMAL HIGH (ref 65–99)
Glucose-Capillary: 90 mg/dL (ref 65–99)

## 2016-07-09 MED ORDER — PREDNISONE 10 MG PO TABS: 10.0000 mg | ORAL_TABLET | Freq: Every day | ORAL | 0 refills | Status: DC

## 2016-07-09 NOTE — Evaluation (Signed)
Physical Therapy Evaluation Patient Details Name: [name] MRN: [id number] DOB: 01-Jun-1945 Today's Date: 07/09/2016   History of Present Illness  Patient is a 71 y/o female that presents with significant hypoxia (84% on 3L) from PCP office. Unable to wean from 4L in the ED thus admitted.   Clinical Impression  Patient is a 71 y/o female with multiple recent admissions for shortness of breath. She is a very limited ambulator at baseline secondary to LE weakness and deconditioning. She requires mod A to transfer supine to sit, surprisingly does not require any assistance from therapist to transfer to standing. She does have 24 hour caretakers present in the home with her. She was able to ambulate very short distance in the room without any significant decrease from baseline in O2 sats. She agreed this is likely her recent mobility baseline and would be able to manage at home with caretakers. She would benefit from HHPT for general conditioning to reduce likelihood of future re-admission.     Follow Up Recommendations Home health PT    Equipment Recommendations       Recommendations for Other Services       Precautions / Restrictions Precautions Precautions: Fall Restrictions Weight Bearing Restrictions: No      Mobility  Bed Mobility Overal bed mobility: Needs Assistance Bed Mobility: Supine to Sit     Supine to sit: Mod assist     General bed mobility comments: Patient required assistance to bring trunk off bed surface due to trunkal weakness.   Transfers Overall transfer level: Needs assistance Equipment used: Rolling walker (2 wheeled) Transfers: Sit to/from Stand Sit to Stand: Min guard         General transfer comment: Patient is able to perform sit to stand with RW x 2 with no assistance from therapist.   Ambulation/Gait Ambulation/Gait assistance: Min guard Ambulation Distance (Feet): 10 Feet Assistive device: Rolling walker (2 wheeled) Gait  Pattern/deviations: WFL(Within Functional Limits);Decreased step length - right;Decreased step length - left   Gait velocity interpretation: Below normal speed for age/gender General Gait Details: Patient takes very short steps, trunk flexed, no overt buckling but began to demonstrate increased crouched gait indicative of LE fatigue and weakness.   Stairs            Wheelchair Mobility    Modified Rankin (Stroke Patients Only)       Balance Overall balance assessment: Needs assistance Sitting-balance support: Bilateral upper extremity supported;Feet supported Sitting balance-Leahy Scale: Fair   Postural control: Posterior lean Standing balance support: Bilateral upper extremity supported Standing balance-Leahy Scale: Fair                               Pertinent Vitals/Pain Pain Assessment: No/denies pain    Home Living Family/patient expects to be discharged to:: Private residence Living Arrangements: Non-relatives/Friends Available Help at Discharge: Personal care attendant;Available 24 hours/day Type of Home: House Home Access: Ramped entrance     Home Layout: One level Home Equipment: Walker - 4 wheels;Wheelchair - manual;Transport chair;Hospital bed;Shower seat      Prior Function Level of Independence: Needs assistance   Gait / Transfers Assistance Needed: Mobility with rollator or WC and SBA limited household distances; Associate Professor for community distances  ADL's / Homemaking Assistance Needed: Requires modA for ADL.         Hand Dominance   Dominant Hand: Right    Extremity/Trunk Assessment   Upper Extremity Assessment  Upper Extremity Assessment: Generalized weakness    Lower Extremity Assessment Lower Extremity Assessment: Generalized weakness       Communication   Communication: No difficulties  Cognition Arousal/Alertness: Awake/alert Behavior During Therapy: WFL for tasks assessed/performed Overall Cognitive Status:  Within Functional Limits for tasks assessed                                        General Comments      Exercises     Assessment/Plan    PT Assessment Patient needs continued PT services  PT Problem List Decreased strength;Decreased activity tolerance;Decreased balance;Decreased mobility;Cardiopulmonary status limiting activity       PT Treatment Interventions Gait training;Functional mobility training;Balance training;Therapeutic exercise;Therapeutic activities;Patient/family education;DME instruction    PT Goals (Current goals can be found in the Care Plan section)  Acute Rehab PT Goals Patient Stated Goal: To return home  PT Goal Formulation: With patient Time For Goal Achievement: 07/23/16 Potential to Achieve Goals: Fair    Frequency Min 2X/week   Barriers to discharge        Co-evaluation               AM-PAC PT "6 Clicks" Daily Activity  Outcome Measure Difficulty turning over in bed (including adjusting bedclothes, sheets and blankets)?: A Little Difficulty moving from lying on back to sitting on the side of the bed? : A Little Difficulty sitting down on and standing up from a chair with arms (e.g., wheelchair, bedside commode, etc,.)?: None Help needed moving to and from a bed to chair (including a wheelchair)?: None Help needed walking in hospital room?: A Little Help needed climbing 3-5 steps with a railing? : Total 6 Click Score: 18    End of Session Equipment Utilized During Treatment: Oxygen;Gait belt Activity Tolerance: Patient limited by fatigue Patient left: in chair;with call bell/phone within reach;with chair alarm set Nurse Communication: Mobility status PT Visit Diagnosis: Muscle weakness (generalized) (M62.81);Other abnormalities of gait and mobility (R26.89)    Time: 8242-3536 PT Time Calculation (min) (ACUTE ONLY): 18 min   Charges:   PT Evaluation $PT Eval Moderate Complexity: 1 Procedure     PT G Codes:   PT  G-Codes **NOT FOR INPATIENT CLASS** Functional Assessment Tool Used: AM-PAC 6 Clicks Basic Mobility Functional Limitation: Mobility: Walking and moving around Mobility: Walking and Moving Around Current Status (R4431): At least 20 percent but less than 40 percent impaired, limited or restricted Mobility: Walking and Moving Around Goal Status [phone]): At least 20 percent but less than 40 percent impaired, limited or restricted   Alva Garnet PT, DPT, CSCS    07/09/2016, 1:13 PM

## 2016-07-09 NOTE — Discharge Summary (Signed)
[facility] [place] NAME: [name]    MR#:  [id number]  DATE OF BIRTH:  06-24-1945  DATE OF ADMISSION:  07/07/2016 ADMITTING PHYSICIAN: Theodoro Grist, MD  DATE OF DISCHARGE: 07/09/2016  1:15 PM  PRIMARY CARE PHYSICIAN: Burnard Hawthorne, FNP     ADMISSION DIAGNOSIS:  COPD exacerbation ([place]) [J44.1] Acute and chr resp failure, unsp w hypoxia or hypercapnia ([place]) [J96.20]  DISCHARGE DIAGNOSIS:  Active Problems:   Acute on chronic respiratory failure with hypoxia (HCC)   COPD with acute exacerbation (HCC)   Lactic acidosis   Acute renal insufficiency   Pressure injury of skin   Elevated troponin   SECONDARY DIAGNOSIS:   Past Medical History:  Diagnosis Date  . Anemia   . Asthma   . CHF (congestive heart failure) ([place])   . CVA (cerebral vascular accident) ([place])   . Diabetes mellitus without complication ([place])   . GERD (gastroesophageal reflux disease)   . Heart murmur   . History of hiatal hernia   . Hypertension   . Panic attacks   . Peripheral vascular disease ([place])   . Restless leg syndrome   . Shortness of breath dyspnea     .pro HOSPITAL COURSE:   The patient is a 71 year old Caucasian female with medical history significant for history of recent admission for pneumonia, who was discharged on Keflex, who presented to the hospital with hypoxia to 84%. Repeat the chest x-ray revealed improvement of pneumonia and pleural effusion. She was wheezing on arrival to emergency room. Patient's lactic acid level was elevated and patient was admitted, she was given some IV fluids, initiated on steroids, and her condition overall improved to baseline. Blood cultures remain -2 days, urine culture revealed 100,000 colony forming units of yeast, sputum cultures were not obtained She was felt to be stable to be discharged home  Discussion by problem:  #1. Acute on chronic respiratory failure with hypoxia, suspected  atelectasis and possibly some COPD exacerbation, improved on incentive spirometry, repeated chest x-ray did not show significant fluid retention, the patient was weaning off to 3 L of oxygen per nasal cannulas, O2 sats remained stable at 87 to 89% at rest and on exertion. We do not recommend high doses of oxygen due to concerns of CO2 retention. #2. COPD exacerbation, continue Keflex, steroid taper, improved clinically, unable to get sputum cultures , blood cultures remain negative. Patient's procalcitonin level was 0.25.  #3. Lactic acidosis, patient received IV fluids on admission, resolved #4 acute renal insufficiency, resolved with hydration, urinalysis was unremarkable, unlikely UTI, although urine cultures revealed Candida, possibly contamination #5. Elevated troponin, repeated troponin is normal, unlikely acute coronary syndrome, likely demand ischemia due to hypoxia #6. Diabetes mellitus with severe hyperglycemia due to steroids, patient was managed on advanced insulin doses while in the hospital, now tapering steroids, blood glucose levels have improved #7. Generalized weakness, patient is to continue home health services  DISCHARGE CONDITIONS:   Stable  CONSULTS OBTAINED:    DRUG ALLERGIES:   Allergies  Allergen Reactions  . Codeine Other (See Comments)    Reaction:  Dizziness   . Liraglutide     Other reaction(s): nausea and stomach pain    DISCHARGE MEDICATIONS:   Discharge Medication List as of 07/09/2016 11:12 AM    START taking these medications   Details  guaiFENesin (MUCINEX) 600 MG 12 hr tablet Take 1 tablet (600 mg total) by mouth 2 (two) times  daily., Starting Fri 07/08/2016, Normal    tiotropium (SPIRIVA) 18 MCG inhalation capsule Place 1 capsule (18 mcg total) into inhaler and inhale daily., Starting Sat 07/09/2016, Normal      CONTINUE these medications which have CHANGED   Details  Amino Acids-Protein Hydrolys (FEEDING SUPPLEMENT, PRO-STAT SUGAR FREE 64,)  LIQD Take 30 mLs by mouth daily., Starting Sat 07/09/2016, Normal    predniSONE (DELTASONE) 10 MG tablet Take 1 tablet (10 mg total) by mouth daily with breakfast. Please take 6 pills in the morning on the day 1 and 2, then taper by one pill every 2 days until finished, thank you, Starting Sat 07/09/2016, Normal      CONTINUE these medications which have NOT CHANGED   Details  acetaminophen (TYLENOL) 325 MG tablet Take 2 tablets (650 mg total) by mouth every 6 (six) hours as needed for mild pain (or Fever >/= 101)., Starting Mon 12/14/2015, OTC    albuterol (PROVENTIL) (2.5 MG/3ML) 0.083% nebulizer solution Take 3 mLs (2.5 mg total) by nebulization every 4 (four) hours as needed for wheezing., Starting Tue 07/05/2016, Normal    aspirin EC 81 MG tablet Take 81 mg by mouth daily., Historical Med    benzonatate (TESSALON) 100 MG capsule Take 1 capsule (100 mg total) by mouth 3 (three) times daily as needed for cough., Starting Tue 07/05/2016, Normal    cephALEXin (KEFLEX) 500 MG capsule Take 1 capsule (500 mg total) by mouth every 12 (twelve) hours., Starting Tue 07/05/2016, Until Sat 07/09/2016, Normal    Cholecalciferol 1000 units capsule Take 1,000 Units by mouth daily., Historical Med    Cyanocobalamin (B-12) 1000 MCG/ML KIT Inject 1,000 mcg as directed every 30 (thirty) days., Starting Tue 02/02/2016, Historical Med    dapsone 100 MG tablet Take 150 mg by mouth daily. , Historical Med    ferrous sulfate 325 (65 FE) MG tablet Take 1 tablet (325 mg total) by mouth daily with breakfast., Starting Tue 4/0/8144, Normal    folic acid (FOLVITE) 1 MG tablet Take 1 mg by mouth daily. Take one pill daily on Tuesday, Wednesday, Thursday, Friday, Saturday and Sunday. These are days were patient isn't taking methotrexate, Historical Med    gabapentin (NEURONTIN) 800 MG tablet Take 800 mg by mouth 2 (two) times daily., Historical Med    hydrochlorothiazide (HYDRODIURIL) 12.5 MG tablet Take 12.5 mg by mouth 2  (two) times daily. , Historical Med    insulin aspart (NOVOLOG) 100 UNIT/ML injection Inject 10 Units into the skin 3 (three) times daily before meals. 301-350+ 10 units, 351 or higher NOTIFY MD, Historical Med    insulin glargine (LANTUS) 100 UNIT/ML injection Inject 0.2 mLs (20 Units total) into the skin at bedtime., Starting Thu 06/02/2016, No Print    lovastatin (MEVACOR) 20 MG tablet Take 20 mg by mouth at bedtime., Historical Med    magnesium oxide (MAG-OX) 400 (241.3 Mg) MG tablet Take 1 tablet (400 mg total) by mouth 2 (two) times daily., Starting Sat 06/25/2016, Normal    methotrexate (RHEUMATREX) 2.5 MG tablet Take 10 mg by mouth once a week. Patient takes on Monday. Caution:Chemotherapy. Protect from light., Historical Med    Multiple Vitamins-Minerals (MULTIVITAL) tablet Take 1 tablet by mouth daily., Historical Med    omeprazole (PRILOSEC) 20 MG capsule Take 1 capsule (20 mg total) by mouth at bedtime., Starting Wed 03/09/2016, Normal    potassium chloride SA (K-DUR,KLOR-CON) 20 MEQ tablet Take 2 tablets (40 mEq total) by mouth 2 (two) times daily.,  Starting Thu 06/02/2016, Print    ramipril (ALTACE) 2.5 MG capsule Take 1 capsule (2.5 mg total) by mouth daily., Starting Fri 06/03/2016, No Print    torsemide (DEMADEX) 20 MG tablet Take 2 tablets (40 mg total) by mouth 2 (two) times daily., Starting Tue 07/05/2016, Until Thu 08/04/2016, Normal    Wound Dressings (SILVASORB) GEL Apply 1 application topically 2 (two) times daily as needed., Starting Tue 07/05/2016, Normal         DISCHARGE INSTRUCTIONS:    The patient is to follow-up with primary care physician within one week after discharge  If you experience worsening of your admission symptoms, develop shortness of breath, life threatening emergency, suicidal or homicidal thoughts you must seek medical attention immediately by calling 911 or calling your MD immediately  if symptoms less severe.  You Must read complete  instructions/literature along with all the possible adverse reactions/side effects for all the Medicines you take and that have been prescribed to you. Take any new Medicines after you have completely understood and accept all the possible adverse reactions/side effects.   Please note  You were cared for by a hospitalist during your hospital stay. If you have any questions about your discharge medications or the care you received while you were in the hospital after you are discharged, you can call the unit and asked to speak with the hospitalist on call if the hospitalist that took care of you is not available. Once you are discharged, your primary care physician will handle any further medical issues. Please note that NO REFILLS for any discharge medications will be authorized once you are discharged, as it is imperative that you return to your primary care physician (or establish a relationship with a primary care physician if you do not have one) for your aftercare needs so that they can reassess your need for medications and monitor your lab values.    Today   CHIEF COMPLAINT:   Chief Complaint  Patient presents with  . Shortness of Breath    HISTORY OF PRESENT ILLNESS:     VITAL SIGNS:  Blood pressure (!) 113/53, pulse 87, temperature 97.6 F (36.4 C), temperature source Oral, resp. rate 18, height 5' 2"  (1.575 m), weight 97.6 kg (215 lb 1.6 oz), SpO2 (!) 87 %.  I/O:   Intake/Output Summary (Last 24 hours) at 07/09/16 1405 Last data filed at 07/09/16 0930  Gross per 24 hour  Intake              360 ml  Output             3400 ml  Net            -3040 ml    PHYSICAL EXAMINATION:  GENERAL:  71 y.o.-year-old patient lying in the bed with no acute distress.  EYES: Pupils equal, round, reactive to light and accommodation. No scleral icterus. Extraocular muscles intact.  HEENT: Head atraumatic, normocephalic. Oropharynx and nasopharynx clear.  NECK:  Supple, no jugular venous  distention. No thyroid enlargement, no tenderness.  LUNGS: Normal breath sounds bilaterally, no wheezing, rales,rhonchi or crepitation. No use of accessory muscles of respiration.  CARDIOVASCULAR: S1, S2 normal. No murmurs, rubs, or gallops.  ABDOMEN: Soft, non-tender, non-distended. Bowel sounds present. No organomegaly or mass.  EXTREMITIES: No pedal edema, cyanosis, or clubbing.  NEUROLOGIC: Cranial nerves II through XII are intact. Muscle strength 5/5 in all extremities. Sensation intact. Gait not checked.  PSYCHIATRIC: The patient is alert and oriented x 3.  SKIN: No obvious rash, lesion, or ulcer.   DATA REVIEW:   CBC  Recent Labs Lab 07/08/16 0502  WBC 8.7  HGB 9.1*  HCT 27.0*  PLT 161    Chemistries   Recent Labs Lab 07/07/16 1329 07/08/16 0502  NA 132* 135  K 4.3 3.3*  CL 89* 90*  CO2 32 35*  GLUCOSE 458* 166*  BUN 13 14  CREATININE 1.03* 0.73  CALCIUM 8.9 8.2*  AST 65*  --   ALT 34  --   ALKPHOS 151*  --   BILITOT 2.4*  --     Cardiac Enzymes  Recent Labs Lab 07/08/16 1100  TROPONINI <0.03    Microbiology Results  Results for orders placed or performed during the hospital encounter of 07/07/16  Blood Culture (routine x 2)     Status: None (Preliminary result)   Collection Time: 07/07/16  1:29 PM  Result Value Ref Range Status   Specimen Description BLOOD L AC  Final   Special Requests   Final    BOTTLES DRAWN AEROBIC AND ANAEROBIC Blood Culture adequate volume   Culture NO GROWTH 2 DAYS  Final   Report Status PENDING  Incomplete  Blood Culture (routine x 2)     Status: None (Preliminary result)   Collection Time: 07/07/16  1:29 PM  Result Value Ref Range Status   Specimen Description BLOOD R HAND  Final   Special Requests   Final    BOTTLES DRAWN AEROBIC AND ANAEROBIC Blood Culture adequate volume   Culture NO GROWTH 2 DAYS  Final   Report Status PENDING  Incomplete  Urine culture     Status: Abnormal   Collection Time: 07/07/16  3:51 PM    Result Value Ref Range Status   Specimen Description URINE, RANDOM  Final   Special Requests NONE  Final   Culture >=100,000 COLONIES/mL YEAST (A)  Final   Report Status 07/09/2016 FINAL  Final    RADIOLOGY:  Dg Chest Port 1 View  Result Date: 07/08/2016 CLINICAL DATA:  Shortness of breath x1 day, asthma, CHF EXAM: PORTABLE CHEST 1 VIEW COMPARISON:  07/07/2016 FINDINGS: Small left pleural effusion. Pulmonary vascular congestion without frank interstitial edema. Mild left basilar atelectasis. No pneumothorax. Cardiomegaly. IMPRESSION: Small left pleural effusion with mild left basilar atelectasis. No frank interstitial edema. Electronically Signed   By: Julian Hy M.D.   On: 07/08/2016 09:52    EKG:   Orders placed or performed during the hospital encounter of 07/07/16  . ED EKG 12-Lead  . ED EKG 12-Lead      Management plans discussed with the patient, family and they are in agreement.  CODE STATUS:     Code Status Orders        Start     Ordered   07/07/16 1731  Full code  Continuous     07/07/16 1730    Code Status History    Date Active Date Inactive Code Status Order ID Comments User Context   06/30/2016 10:36 PM 07/05/2016  7:13 PM Full Code [id number]  Demetrios Loll, MD Inpatient   06/23/2016  1:12 AM 06/25/2016  2:20 PM Full Code [id number]  [place], [place], DO Inpatient   05/31/2016  1:26 AM 06/02/2016  8:27 PM Full Code [id number]  Max Sane, MD Inpatient   12/10/2015  4:55 PM 12/14/2015  9:13 PM Full Code [id number]  Hillary Bow, MD ED   03/13/2015  7:03 PM 03/15/2015  6:55 PM Full Code [id number]  Anselm Jungling,  Rosalio Macadamia, MD Inpatient   07/24/2014  5:53 PM 07/25/2014  6:55 PM Full Code [id number]  Algernon Huxley, MD Inpatient    Advance Directive Documentation     Most Recent Value  Type of Advance Directive  Healthcare Power of [place], Living will  Pre-existing out of facility DNR order (yellow form or pink MOST form)  -  "MOST" Form in Place?  -      TOTAL  TIME TAKING CARE OF THIS PATIENT: 40  minutes.    Theodoro Grist M.D on 07/09/2016 at 2:05 PM  Between 7am to 6pm - Pager - [phone]  After 6pm go to [email or web address] - password [email or web address]  Office  [phone]  CC: Primary care physician; Burnard Hawthorne, FNP

## 2016-07-09 NOTE — Progress Notes (Signed)
Patient received PT;  discharged this shift via w/c by staff with family by side; denies SOB or pain at this time. Remains on O2; voices understanding of discharge instructions.

## 2016-07-09 NOTE — Care Management Note (Addendum)
Case Management Note  Patient Details  Name: [name] MRN: [id number] Date of Birth: 01/05/1946  Subjective/Objective:      Mrs Comes is an open client of [facility]. Referral for resumption of care and HRI called to [facility]. Per previous CM note [facility] has already delivered a portable oxygen tank to [name] hospital room. [name] already has home oxygen set up.  [name] was advised that [name] is an HRI patient.            Action/Plan:   Expected Discharge Date:  07/09/16               Expected Discharge Plan:   07/08/16  In-House Referral:     Discharge planning Services   CM  Post Acute Care Choice:   [facility] Choice offered to:   Patient  DME Arranged: No needs   DME Agency:   NA  HH Arranged:   HH=PT and RN (HRI) HH Agency:   [facility]  Status of Service:   Completed. Discharge home today on 07/08/16.  If discussed at Long Length of Stay Meetings, dates discussed:    Additional Comments:  Harvy Riera A, RN 07/09/2016, 10:57 AM

## 2016-07-12 LAB — CULTURE, BLOOD (ROUTINE X 2)
Culture: NO GROWTH
Culture: NO GROWTH
Special Requests: ADEQUATE
Special Requests: ADEQUATE

## 2016-07-14 ENCOUNTER — Other Ambulatory Visit: Payer: Self-pay | Admitting: Family

## 2016-07-14 ENCOUNTER — Telehealth: Payer: Self-pay | Admitting: Family

## 2016-07-14 NOTE — Telephone Encounter (Addendum)
Left voice mail to call back  For [facility]

## 2016-07-14 NOTE — Telephone Encounter (Signed)
Call pt  I would be far more comfortable seeing pt to assess fluid-volume status. She needs appt. Any openings tomorrow?  Per her chart, she is on demadex 20 mg QD, not lasix.   What is her weight yesterday and today?  What is her sao2 and bp?   Please also advise that she calls the heart failure clinic to make them aware of congestion.

## 2016-07-14 NOTE — Telephone Encounter (Signed)
[name] from [place] called and stated that the pt has increased chest congestion and some possible fluid on her lungs. They want to know if they can increase her Lasix. Please advise, thank you!  Call [place] @ [phone]

## 2016-07-14 NOTE — Telephone Encounter (Signed)
Please advise 

## 2016-07-14 NOTE — Telephone Encounter (Signed)
Spoke with [facility] ,  Advised her that patient would need to follow up with heart failure clinic in regards to congestion.  Verlon Au stated she would advise patient .  Advised her that we didn't have any open appointments.    Weight today 213 weight not known from yesterday Discharged from [facility]  5/12 Furosemide 40 mg 1 bid, HCTZ 12.5 mg 1 qd  Mucinex 600 mg 1 qd  We have not seen patient since discharged from hospital.

## 2016-07-15 ENCOUNTER — Telehealth: Payer: Self-pay | Admitting: *Deleted

## 2016-07-15 ENCOUNTER — Inpatient Hospital Stay
Admission: EM | Admit: 2016-07-15 | Discharge: 2016-07-19 | DRG: 177 | Disposition: A | Discharge status: Home / Self Care | Payer: Medicare Other | Attending: Specialist | Admitting: Specialist

## 2016-07-15 ENCOUNTER — Emergency Department: Payer: Medicare Other

## 2016-07-15 DIAGNOSIS — J189 Pneumonia, unspecified organism: Secondary | ICD-10-CM

## 2016-07-15 DIAGNOSIS — IMO0002 Reserved for concepts with insufficient information to code with codable children: Secondary | ICD-10-CM

## 2016-07-15 DIAGNOSIS — E1140 Type 2 diabetes mellitus with diabetic neuropathy, unspecified: Secondary | ICD-10-CM

## 2016-07-15 DIAGNOSIS — E785 Hyperlipidemia, unspecified: Secondary | ICD-10-CM | POA: Diagnosis present

## 2016-07-15 DIAGNOSIS — T380X5A Adverse effect of glucocorticoids and synthetic analogues, initial encounter: Secondary | ICD-10-CM | POA: Diagnosis present

## 2016-07-15 DIAGNOSIS — Z9221 Personal history of antineoplastic chemotherapy: Secondary | ICD-10-CM

## 2016-07-15 DIAGNOSIS — Z6839 Body mass index (BMI) 39.0-39.9, adult: Secondary | ICD-10-CM

## 2016-07-15 DIAGNOSIS — J15212 Pneumonia due to Methicillin resistant Staphylococcus aureus: Principal | ICD-10-CM | POA: Diagnosis present

## 2016-07-15 DIAGNOSIS — A419 Sepsis, unspecified organism: Secondary | ICD-10-CM

## 2016-07-15 DIAGNOSIS — Z79899 Other long term (current) drug therapy: Secondary | ICD-10-CM | POA: Diagnosis not present

## 2016-07-15 DIAGNOSIS — E669 Obesity, unspecified: Secondary | ICD-10-CM | POA: Diagnosis present

## 2016-07-15 DIAGNOSIS — D638 Anemia in other chronic diseases classified elsewhere: Secondary | ICD-10-CM | POA: Diagnosis present

## 2016-07-15 DIAGNOSIS — N179 Acute kidney failure, unspecified: Secondary | ICD-10-CM

## 2016-07-15 DIAGNOSIS — Z9981 Dependence on supplemental oxygen: Secondary | ICD-10-CM

## 2016-07-15 DIAGNOSIS — Z7982 Long term (current) use of aspirin: Secondary | ICD-10-CM

## 2016-07-15 DIAGNOSIS — E872 Acidosis: Secondary | ICD-10-CM | POA: Diagnosis present

## 2016-07-15 DIAGNOSIS — E1151 Type 2 diabetes mellitus with diabetic peripheral angiopathy without gangrene: Secondary | ICD-10-CM | POA: Diagnosis present

## 2016-07-15 DIAGNOSIS — Z8673 Personal history of transient ischemic attack (TIA), and cerebral infarction without residual deficits: Secondary | ICD-10-CM | POA: Diagnosis not present

## 2016-07-15 DIAGNOSIS — Z794 Long term (current) use of insulin: Secondary | ICD-10-CM

## 2016-07-15 DIAGNOSIS — L89150 Pressure ulcer of sacral region, unstageable: Secondary | ICD-10-CM | POA: Diagnosis present

## 2016-07-15 DIAGNOSIS — E1165 Type 2 diabetes mellitus with hyperglycemia: Secondary | ICD-10-CM | POA: Diagnosis present

## 2016-07-15 DIAGNOSIS — I509 Heart failure, unspecified: Secondary | ICD-10-CM | POA: Diagnosis present

## 2016-07-15 DIAGNOSIS — Y95 Nosocomial condition: Secondary | ICD-10-CM | POA: Diagnosis present

## 2016-07-15 DIAGNOSIS — R059 Cough, unspecified: Secondary | ICD-10-CM

## 2016-07-15 DIAGNOSIS — M069 Rheumatoid arthritis, unspecified: Secondary | ICD-10-CM | POA: Diagnosis present

## 2016-07-15 DIAGNOSIS — R05 Cough: Secondary | ICD-10-CM

## 2016-07-15 DIAGNOSIS — J9621 Acute and chronic respiratory failure with hypoxia: Secondary | ICD-10-CM | POA: Diagnosis present

## 2016-07-15 DIAGNOSIS — Z888 Allergy status to other drugs, medicaments and biological substances status: Secondary | ICD-10-CM | POA: Diagnosis not present

## 2016-07-15 DIAGNOSIS — I110 Hypertensive heart disease with heart failure: Secondary | ICD-10-CM | POA: Diagnosis present

## 2016-07-15 DIAGNOSIS — J440 Chronic obstructive pulmonary disease with acute lower respiratory infection: Secondary | ICD-10-CM | POA: Diagnosis present

## 2016-07-15 DIAGNOSIS — Z885 Allergy status to narcotic agent status: Secondary | ICD-10-CM

## 2016-07-15 DIAGNOSIS — K219 Gastro-esophageal reflux disease without esophagitis: Secondary | ICD-10-CM | POA: Diagnosis present

## 2016-07-15 DIAGNOSIS — Z9842 Cataract extraction status, left eye: Secondary | ICD-10-CM

## 2016-07-15 DIAGNOSIS — J441 Chronic obstructive pulmonary disease with (acute) exacerbation: Secondary | ICD-10-CM | POA: Diagnosis present

## 2016-07-15 DIAGNOSIS — Z90710 Acquired absence of both cervix and uterus: Secondary | ICD-10-CM

## 2016-07-15 LAB — CBC WITH DIFFERENTIAL/PLATELET
BASOS ABS: 0 10*3/uL (ref 0–0.1)
Basophils Relative: 0 %
EOS ABS: 0 10*3/uL (ref 0–0.7)
EOS PCT: 0 %
HCT: 23.6 % — ABNORMAL LOW (ref 35.0–47.0)
Hemoglobin: 7.7 g/dL — ABNORMAL LOW (ref 12.0–16.0)
LYMPHS PCT: 6 %
Lymphs Abs: 1 10*3/uL (ref 1.0–3.6)
MCH: 31.5 pg (ref 26.0–34.0)
MCHC: 32.5 g/dL (ref 32.0–36.0)
MCV: 97.1 fL (ref 80.0–100.0)
Monocytes Absolute: 0.9 10*3/uL (ref 0.2–0.9)
Monocytes Relative: 5 %
Neutro Abs: 16 10*3/uL — ABNORMAL HIGH (ref 1.4–6.5)
Neutrophils Relative %: 89 %
PLATELETS: 187 10*3/uL (ref 150–440)
RBC: 2.43 MIL/uL — AB (ref 3.80–5.20)
RDW: 20.1 % — ABNORMAL HIGH (ref 11.5–14.5)
WBC: 18 10*3/uL — AB (ref 3.6–11.0)

## 2016-07-15 LAB — URINALYSIS, COMPLETE (UACMP) WITH MICROSCOPIC
BACTERIA UA: NONE SEEN
BILIRUBIN URINE: NEGATIVE
GLUCOSE, UA: 150 mg/dL — AB
Ketones, ur: NEGATIVE mg/dL
NITRITE: NEGATIVE
PH: 6 (ref 5.0–8.0)
Protein, ur: NEGATIVE mg/dL
SPECIFIC GRAVITY, URINE: 1.01 (ref 1.005–1.030)

## 2016-07-15 LAB — COMPREHENSIVE METABOLIC PANEL
ALT: 31 U/L (ref 14–54)
AST: 48 U/L — AB (ref 15–41)
Albumin: 2.9 g/dL — ABNORMAL LOW (ref 3.5–5.0)
Alkaline Phosphatase: 126 U/L (ref 38–126)
Anion gap: 9 (ref 5–15)
BILIRUBIN TOTAL: 2.7 mg/dL — AB (ref 0.3–1.2)
BUN: 36 mg/dL — AB (ref 6–20)
CO2: 34 mmol/L — ABNORMAL HIGH (ref 22–32)
Calcium: 8.9 mg/dL (ref 8.9–10.3)
Chloride: 87 mmol/L — ABNORMAL LOW (ref 101–111)
Creatinine, Ser: 1.18 mg/dL — ABNORMAL HIGH (ref 0.44–1.00)
GFR calc Af Amer: 53 mL/min — ABNORMAL LOW (ref >60)
GFR, EST NON AFRICAN AMERICAN: 46 mL/min — AB (ref >60)
Glucose, Bld: 126 mg/dL — ABNORMAL HIGH (ref 65–99)
POTASSIUM: 3.6 mmol/L (ref 3.5–5.1)
Sodium: 130 mmol/L — ABNORMAL LOW (ref 135–145)
TOTAL PROTEIN: 6.1 g/dL — AB (ref 6.5–8.1)

## 2016-07-15 LAB — MRSA PCR SCREENING: MRSA by PCR: NEGATIVE

## 2016-07-15 LAB — LACTIC ACID, PLASMA
LACTIC ACID, VENOUS: 2.3 mmol/L — AB (ref 0.5–1.9)
Lactic Acid, Venous: 1.6 mmol/L (ref 0.5–1.9)

## 2016-07-15 LAB — GLUCOSE, CAPILLARY
GLUCOSE-CAPILLARY: 326 mg/dL — AB (ref 65–99)
Glucose-Capillary: 397 mg/dL — ABNORMAL HIGH (ref 65–99)

## 2016-07-15 LAB — PROCALCITONIN: Procalcitonin: 1.08 ng/mL

## 2016-07-15 MED ORDER — ALBUTEROL SULFATE (2.5 MG/3ML) 0.083% IN NEBU: 2.5000 mg | INHALATION_SOLUTION | RESPIRATORY_TRACT | Status: DC | PRN

## 2016-07-15 MED ORDER — DEXTROSE 5 % IV SOLN
2.0000 g | Freq: Once | INTRAVENOUS | Status: AC
  Administered 2016-07-15: 2 g via INTRAVENOUS
  Filled 2016-07-15: qty 2

## 2016-07-15 MED ORDER — RAMIPRIL 2.5 MG PO CAPS
2.5000 mg | ORAL_CAPSULE | Freq: Every day | ORAL | Status: DC
Start: 2016-07-15 — End: 2016-07-15

## 2016-07-15 MED ORDER — INSULIN ASPART 100 UNIT/ML ~~LOC~~ SOLN
0.0000 [IU] | Freq: Three times a day (TID) | SUBCUTANEOUS | Status: DC
  Administered 2016-07-15: 7 [IU] via SUBCUTANEOUS
  Filled 2016-07-15: qty 7

## 2016-07-15 MED ORDER — PANTOPRAZOLE SODIUM 40 MG PO TBEC
40.0000 mg | DELAYED_RELEASE_TABLET | Freq: Every day | ORAL | Status: DC
  Administered 2016-07-15 – 2016-07-19 (×5): 40 mg via ORAL
  Filled 2016-07-15 (×5): qty 1

## 2016-07-15 MED ORDER — VITAMIN D 1000 UNITS PO TABS
1000.0000 [IU] | ORAL_TABLET | Freq: Every day | ORAL | Status: DC
  Administered 2016-07-16 – 2016-07-19 (×4): 1000 [IU] via ORAL
  Filled 2016-07-15 (×4): qty 1

## 2016-07-15 MED ORDER — TIOTROPIUM BROMIDE MONOHYDRATE 18 MCG IN CAPS: 18.0000 ug | ORAL_CAPSULE | Freq: Every day | RESPIRATORY_TRACT | Status: DC

## 2016-07-15 MED ORDER — SODIUM CHLORIDE 0.9 % IV SOLN
1250.0000 mg | INTRAVENOUS | Status: DC
  Administered 2016-07-15: 1250 mg via INTRAVENOUS
  Filled 2016-07-15 (×2): qty 1250

## 2016-07-15 MED ORDER — METHYLPREDNISOLONE SODIUM SUCC 125 MG IJ SOLR
60.0000 mg | Freq: Four times a day (QID) | INTRAMUSCULAR | Status: DC
  Administered 2016-07-15 – 2016-07-16 (×3): 60 mg via INTRAVENOUS
  Filled 2016-07-15 (×3): qty 2

## 2016-07-15 MED ORDER — VANCOMYCIN HCL IN DEXTROSE 1-5 GM/200ML-% IV SOLN
1000.0000 mg | Freq: Once | INTRAVENOUS | Status: AC
  Administered 2016-07-15: 1000 mg via INTRAVENOUS
  Filled 2016-07-15: qty 200

## 2016-07-15 MED ORDER — GUAIFENESIN ER 600 MG PO TB12
600.0000 mg | ORAL_TABLET | Freq: Two times a day (BID) | ORAL | Status: DC
  Administered 2016-07-15 – 2016-07-19 (×8): 600 mg via ORAL
  Filled 2016-07-15 (×8): qty 1

## 2016-07-15 MED ORDER — GABAPENTIN 400 MG PO CAPS
800.0000 mg | ORAL_CAPSULE | Freq: Two times a day (BID) | ORAL | Status: DC
  Administered 2016-07-15 – 2016-07-19 (×8): 800 mg via ORAL
  Filled 2016-07-15 (×8): qty 2

## 2016-07-15 MED ORDER — MAGNESIUM OXIDE 400 (241.3 MG) MG PO TABS
400.0000 mg | ORAL_TABLET | Freq: Two times a day (BID) | ORAL | Status: DC
  Administered 2016-07-15 – 2016-07-19 (×8): 400 mg via ORAL
  Filled 2016-07-15 (×8): qty 1

## 2016-07-15 MED ORDER — FOLIC ACID 1 MG PO TABS
1.0000 mg | ORAL_TABLET | ORAL | Status: DC
  Administered 2016-07-16 – 2016-07-19 (×3): 1 mg via ORAL
  Filled 2016-07-15 (×3): qty 1

## 2016-07-15 MED ORDER — BENZONATATE 100 MG PO CAPS
100.0000 mg | ORAL_CAPSULE | Freq: Three times a day (TID) | ORAL | Status: DC | PRN
  Administered 2016-07-16 – 2016-07-19 (×3): 100 mg via ORAL
  Filled 2016-07-15 (×4): qty 1

## 2016-07-15 MED ORDER — HEPARIN SODIUM (PORCINE) 5000 UNIT/ML IJ SOLN
5000.0000 [IU] | Freq: Three times a day (TID) | INTRAMUSCULAR | Status: DC
  Administered 2016-07-15 – 2016-07-19 (×12): 5000 [IU] via SUBCUTANEOUS
  Filled 2016-07-15 (×12): qty 1

## 2016-07-15 MED ORDER — ASPIRIN EC 81 MG PO TBEC
81.0000 mg | DELAYED_RELEASE_TABLET | Freq: Every day | ORAL | Status: DC
  Administered 2016-07-16 – 2016-07-19 (×4): 81 mg via ORAL
  Filled 2016-07-15 (×4): qty 1

## 2016-07-15 MED ORDER — TORSEMIDE 20 MG PO TABS
40.0000 mg | ORAL_TABLET | Freq: Two times a day (BID) | ORAL | Status: DC
  Administered 2016-07-15 – 2016-07-16 (×2): 40 mg via ORAL
  Filled 2016-07-15 (×2): qty 2

## 2016-07-15 MED ORDER — TRAMADOL HCL 50 MG PO TABS
25.0000 mg | ORAL_TABLET | Freq: Four times a day (QID) | ORAL | Status: DC | PRN
  Administered 2016-07-15 – 2016-07-19 (×8): 25 mg via ORAL
  Filled 2016-07-15 (×8): qty 1

## 2016-07-15 MED ORDER — ORAL CARE MOUTH RINSE
15.0000 mL | Freq: Two times a day (BID) | OROMUCOSAL | Status: DC
  Administered 2016-07-15 – 2016-07-19 (×8): 15 mL via OROMUCOSAL

## 2016-07-15 MED ORDER — FERROUS SULFATE 325 (65 FE) MG PO TABS
325.0000 mg | ORAL_TABLET | Freq: Every day | ORAL | Status: DC
  Administered 2016-07-16 – 2016-07-19 (×4): 325 mg via ORAL
  Filled 2016-07-15 (×4): qty 1

## 2016-07-15 MED ORDER — PRO-STAT SUGAR FREE PO LIQD
30.0000 mL | Freq: Every day | ORAL | Status: DC
  Administered 2016-07-16 – 2016-07-19 (×4): 30 mL via ORAL

## 2016-07-15 MED ORDER — NYSTATIN 100000 UNIT/GM EX POWD
Freq: Two times a day (BID) | CUTANEOUS | Status: DC
  Administered 2016-07-15 – 2016-07-17 (×4): via TOPICAL
  Filled 2016-07-15: qty 15

## 2016-07-15 MED ORDER — SODIUM CHLORIDE 0.9 % IV BOLUS (SEPSIS)
1000.0000 mL | Freq: Once | INTRAVENOUS | Status: AC
  Administered 2016-07-15: 1000 mL via INTRAVENOUS

## 2016-07-15 MED ORDER — INSULIN ASPART 100 UNIT/ML ~~LOC~~ SOLN
0.0000 [IU] | Freq: Three times a day (TID) | SUBCUTANEOUS | Status: DC
  Administered 2016-07-15: 15 [IU] via SUBCUTANEOUS
  Administered 2016-07-16: 11 [IU] via SUBCUTANEOUS
  Administered 2016-07-16 – 2016-07-17 (×4): 15 [IU] via SUBCUTANEOUS
  Administered 2016-07-17: 8 [IU] via SUBCUTANEOUS
  Administered 2016-07-17: 15 [IU] via SUBCUTANEOUS
  Administered 2016-07-18: 5 [IU] via SUBCUTANEOUS
  Administered 2016-07-18: 2 [IU] via SUBCUTANEOUS
  Administered 2016-07-18: 17:00:00 8 [IU] via SUBCUTANEOUS
  Administered 2016-07-18: 5 [IU] via SUBCUTANEOUS
  Administered 2016-07-19: 12:00:00 3 [IU] via SUBCUTANEOUS
  Filled 2016-07-15: qty 15
  Filled 2016-07-15: qty 2
  Filled 2016-07-15: qty 15
  Filled 2016-07-15: qty 8
  Filled 2016-07-15: qty 11
  Filled 2016-07-15 (×2): qty 15
  Filled 2016-07-15: qty 8
  Filled 2016-07-15: qty 15
  Filled 2016-07-15: qty 11
  Filled 2016-07-15 (×2): qty 5
  Filled 2016-07-15: qty 3
  Filled 2016-07-15: qty 15

## 2016-07-15 MED ORDER — IPRATROPIUM-ALBUTEROL 0.5-2.5 (3) MG/3ML IN SOLN
3.0000 mL | RESPIRATORY_TRACT | Status: DC
  Administered 2016-07-15 – 2016-07-16 (×2): 3 mL via RESPIRATORY_TRACT
  Filled 2016-07-15 (×2): qty 3

## 2016-07-15 MED ORDER — ACETAMINOPHEN 325 MG PO TABS
650.0000 mg | ORAL_TABLET | Freq: Four times a day (QID) | ORAL | Status: DC | PRN
  Administered 2016-07-15: 23:00:00 650 mg via ORAL
  Filled 2016-07-15: qty 2

## 2016-07-15 MED ORDER — PRAVASTATIN SODIUM 20 MG PO TABS
20.0000 mg | ORAL_TABLET | Freq: Every day | ORAL | Status: DC
  Administered 2016-07-15 – 2016-07-18 (×4): 20 mg via ORAL
  Filled 2016-07-15 (×5): qty 1

## 2016-07-15 MED ORDER — BUDESONIDE 0.25 MG/2ML IN SUSP
0.2500 mg | Freq: Two times a day (BID) | RESPIRATORY_TRACT | Status: DC
  Administered 2016-07-15 – 2016-07-19 (×8): 0.25 mg via RESPIRATORY_TRACT
  Filled 2016-07-15 (×8): qty 2

## 2016-07-15 MED ORDER — HYDROCHLOROTHIAZIDE 25 MG PO TABS: 12.5000 mg | ORAL_TABLET | Freq: Two times a day (BID) | ORAL | Status: DC

## 2016-07-15 MED ORDER — INSULIN GLARGINE 100 UNIT/ML ~~LOC~~ SOLN
20.0000 [IU] | Freq: Every day | SUBCUTANEOUS | Status: DC
  Administered 2016-07-15 – 2016-07-16 (×2): 20 [IU] via SUBCUTANEOUS
  Filled 2016-07-15 (×2): qty 0.2

## 2016-07-15 MED ORDER — METHYLPREDNISOLONE SODIUM SUCC 125 MG IJ SOLR
125.0000 mg | Freq: Once | INTRAMUSCULAR | Status: AC
  Administered 2016-07-15: 125 mg via INTRAVENOUS
  Filled 2016-07-15: qty 2

## 2016-07-15 MED ORDER — IPRATROPIUM-ALBUTEROL 0.5-2.5 (3) MG/3ML IN SOLN
3.0000 mL | Freq: Once | RESPIRATORY_TRACT | Status: AC
  Administered 2016-07-15: 3 mL via RESPIRATORY_TRACT
  Filled 2016-07-15: qty 3

## 2016-07-15 MED ORDER — TIOTROPIUM BROMIDE MONOHYDRATE 18 MCG IN CAPS
18.0000 ug | ORAL_CAPSULE | Freq: Every day | RESPIRATORY_TRACT | Status: DC
  Administered 2016-07-16 – 2016-07-19 (×4): 18 ug via RESPIRATORY_TRACT
  Filled 2016-07-15: qty 5

## 2016-07-15 MED ORDER — DOCUSATE SODIUM 100 MG PO CAPS: 100.0000 mg | ORAL_CAPSULE | Freq: Two times a day (BID) | ORAL | Status: DC | PRN

## 2016-07-15 NOTE — Telephone Encounter (Signed)
Please call daughter [name] at [phone]

## 2016-07-15 NOTE — ED Notes (Signed)
Linens changed with new chux. New diaper put on patient. Patient tolerated procedure well.

## 2016-07-15 NOTE — ED Provider Notes (Signed)
[facility] Emergency Department Provider Note ____________________________________________   I have reviewed the triage vital signs and the triage nursing note.  HISTORY  Chief Complaint Shortness of Breath    Historian Patient  HPI [name] is a 71 y.o. female with history of asthma, CHF, diabetes and obesity, chronic O2 at least for the past several weeks since her last hospitalization, 2 L at home, increased to 3 L over the past one week since she's been at home with 24-hour caregivers, 2 days a week with her daughter, 5 days a week with outside caregivers noted her 24 hours, presents today with decreased O2 sat 84% on 3 L and worsening generalized weakness and dyspnea. No productive cough. No fever. Denies increased lower extremity swelling or pain.  Denies chest pain. Occasional slight confusion.    Past Medical History:  Diagnosis Date  . Anemia   . Asthma   . CHF (congestive heart failure) ([place])   . CVA (cerebral vascular accident) ([place])   . Diabetes mellitus without complication ([place])   . GERD (gastroesophageal reflux disease)   . Heart murmur   . History of hiatal hernia   . Hypertension   . Panic attacks   . Peripheral vascular disease ([place])   . Restless leg syndrome   . Shortness of breath dyspnea     Patient Active Problem List   Diagnosis Date Noted  . Pressure injury of skin 07/08/2016  . Elevated troponin 07/08/2016  . COPD (chronic obstructive pulmonary disease) ([place]) 07/07/2016  . Acute on chronic respiratory failure with hypoxia ([place]) 07/07/2016  . COPD with acute exacerbation ([place]) 07/07/2016  . Lactic acidosis 07/07/2016  . Acute renal insufficiency 07/07/2016  . HTN (hypertension) 07/06/2016  . Advance care planning   . Palliative care by specialist   . Cholelithiasis 06/27/2016  . Aortic stenosis 06/27/2016  . Sepsis ([place]) 06/22/2016  . Pain of upper abdomen 03/10/2016  . Morbid obesity ([place]) 02/02/2016  .  Benign hypertensive heart disease with CHF (congestive heart failure) ([place]) 01/06/2016  . Goals of care, counseling/discussion 12/30/2015  . Recurrent falls 12/13/2015  . Fatty liver 12/10/2015  . Fracture of multiple ribs 11/25/2015  . B12 deficiency anemia 11/03/2015  . GERD (gastroesophageal reflux disease) 10/17/2015  . Pure hypercholesterolemia 10/17/2015  . Uncontrolled type 2 diabetes mellitus with diabetic neuropathy, with long-term current use of insulin ([place]) 10/05/2015  . Depression 10/05/2015  . 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 CHF (congestive heart failure) ([place]) 03/13/2015  . Carotid stenosis 07/24/2014    Past Surgical History:  Procedure Laterality Date  . ABDOMINAL HYSTERECTOMY    . BREAST BIOPSY Right yrs ago   benign  . ENDARTERECTOMY Right 07/24/2014   Procedure: ENDARTERECTOMY CAROTID;  Surgeon: Algernon Huxley, MD;  Location: [facility] ORS;  Service: Vascular;  Laterality: Right;  . EYE SURGERY Left    cataract  . FRACTURE SURGERY Left    fractured ankle  . TONSILLECTOMY      Prior to Admission medications   Medication Sig Start Date End Date Taking? Authorizing Provider  methotrexate (RHEUMATREX) 2.5 MG tablet Take 10 mg by mouth once a week. Patient takes on Monday. Caution:Chemotherapy. Protect from light.   Yes [provider]  Multiple Vitamins-Minerals (MULTIVITAL) tablet Take 1 tablet by mouth daily.   Yes [provider]  omeprazole (PRILOSEC) 20 MG capsule Take 1 capsule (20 mg total) by mouth at bedtime. 03/09/16  Yes Burnard Hawthorne, FNP  potassium  chloride SA (K-DUR,KLOR-CON) 20 MEQ tablet Take 2 tablets (40 mEq total) by mouth 2 (two) times daily. 06/02/16  Yes Demetrios Loll, MD  tiotropium (SPIRIVA) 18 MCG inhalation capsule Place 1 capsule (18 mcg total) into inhaler and inhale daily. 07/09/16  Yes Theodoro Grist, MD  torsemide (DEMADEX) 20 MG tablet Take 2 tablets (40 mg total) by mouth 2 (two) times daily. 07/05/16 08/04/16 Yes  Dustin Flock, MD  acetaminophen (TYLENOL) 325 MG tablet Take 2 tablets (650 mg total) by mouth every 6 (six) hours as needed for mild pain (or Fever >/= 101). 12/14/15   Gouru, Illene Silver, MD  albuterol (PROVENTIL) (2.5 MG/3ML) 0.083% nebulizer solution Take 3 mLs (2.5 mg total) by nebulization every 4 (four) hours as needed for wheezing. 07/05/16   Dustin Flock, MD  Amino Acids-Protein Hydrolys (FEEDING SUPPLEMENT, PRO-STAT SUGAR FREE 64,) LIQD Take 30 mLs by mouth daily. 07/09/16   Theodoro Grist, MD  aspirin EC 81 MG tablet Take 81 mg by mouth daily.    [provider]  benzonatate (TESSALON) 100 MG capsule Take 1 capsule (100 mg total) by mouth 3 (three) times daily as needed for cough. 07/05/16   Dustin Flock, MD  Cholecalciferol 1000 units capsule Take 1,000 Units by mouth daily.    [provider]  Cyanocobalamin (B-12) 1000 MCG/ML KIT Inject 1,000 mcg as directed every 30 (thirty) days. 02/02/16   [provider]  dapsone 100 MG tablet Take 150 mg by mouth daily.     [provider]  ferrous sulfate 325 (65 FE) MG tablet Take 1 tablet (325 mg total) by mouth daily with breakfast. 11/03/15   Burnard Hawthorne, FNP  folic acid (FOLVITE) 1 MG tablet Take 1 mg by mouth daily. Take one pill daily on Tuesday, Wednesday, Thursday, Friday, Saturday and Sunday. These are days were patient isn't taking methotrexate    [provider]  gabapentin (NEURONTIN) 800 MG tablet Take 800 mg by mouth 2 (two) times daily.    [provider]  guaiFENesin (MUCINEX) 600 MG 12 hr tablet Take 1 tablet (600 mg total) by mouth 2 (two) times daily. 07/08/16   Theodoro Grist, MD  hydrochlorothiazide (HYDRODIURIL) 12.5 MG tablet Take 12.5 mg by mouth 2 (two) times daily.     [provider]  insulin aspart (NOVOLOG) 100 UNIT/ML injection Inject 10 Units into the skin 3 (three) times daily before meals. 301-350+ 10 units, 351 or higher NOTIFY MD    [provider]  insulin glargine (LANTUS) 100 UNIT/ML injection Inject 0.2 mLs (20 Units total) into the skin at bedtime. 06/02/16   Demetrios Loll, MD  lovastatin (MEVACOR) 20 MG tablet Take 20 mg by mouth at bedtime.    [provider]  magnesium oxide (MAG-OX) 400 (241.3 Mg) MG tablet Take 1 tablet (400 mg total) by mouth 2 (two) times daily. 06/25/16   Fritzi Mandes, MD  predniSONE (DELTASONE) 10 MG tablet Take 1 tablet (10 mg total) by mouth daily with breakfast. Please take 6 pills in the morning on the day 1 and 2, then taper by one pill every 2 days until finished, thank you 07/09/16   Theodoro Grist, MD  ramipril (ALTACE) 2.5 MG capsule Take 1 capsule (2.5 mg total) by mouth daily. 06/03/16   Demetrios Loll, MD  Wound Dressings (SILVASORB) GEL Apply 1 application topically 2 (two) times daily as needed. 07/05/16   Dustin Flock, MD    Allergies  Allergen Reactions  . Codeine Other (  See Comments)    Reaction:  Dizziness   . Liraglutide     Other reaction(s): nausea and stomach pain    Family History  Problem Relation Age of Onset  . Pancreatic cancer Mother   . CAD Father   . Hypertension Father   . Pancreatic cancer Father   . Colon cancer Father   . CAD Brother   . Heart disease Brother        Heart attack  . Breast cancer Maternal Aunt        pt states several maternal aunts    Social History Social History  Substance Use Topics  . Smoking status: Never Smoker  . Smokeless tobacco: Never Used  . Alcohol use No    Review of Systems  Constitutional: Negative for fever. Eyes: Negative for visual changes. ENT: Negative for sore throat. Cardiovascular: Negative for chest pain. Respiratory: Positive for shortness of breath. Gastrointestinal: Negative for abdominal pain, vomiting and diarrhea. Genitourinary: Negative for dysuria. Musculoskeletal: Negative for back pain. Skin: Negative for rash. Neurological: Negative for  headache.  ____________________________________________   PHYSICAL EXAM:  VITAL SIGNS: ED Triage Vitals  Enc Vitals Group     BP 07/15/16 1135 (!) 127/48     Pulse Rate 07/15/16 1135 (!) 105     Resp 07/15/16 1135 (!) 26     Temp 07/15/16 1135 99.1 F (37.3 C)     Temp Source 07/15/16 1135 Oral     SpO2 07/15/16 1135 93 %     Weight 07/15/16 1136 215 lb (97.5 kg)     Height 07/15/16 1136 _0  (1.575 m)     Head Circumference --      Peak Flow --      Pain Score 07/15/16 1135 0     Pain Loc --      Pain Edu? --      Excl. in [place]? --      Constitutional: Alert and Cooperative, a little bit slow to answer questions. Well appearing and in no distress. HEENT   Head: Normocephalic and atraumatic.      Eyes: Conjunctivae are normal. Pupils equal and round.       Ears:         Nose: No congestion/rhinnorhea.   Mouth/Throat: Mucous membranes are moist.   Neck: No stridor. Cardiovascular/Chest: Normal rate, regular rhythm.  No murmurs, rubs, or gallops. Respiratory: Slightly tachypnea. No retractions. Decreased air movement throughout with moderate and x-ray wheezing throughout and mild rhonchi. Gastrointestinal: Soft. No distention, no guarding, no rebound. Nontender.  Morbidly obese Genitourinary/rectal:Deferred Musculoskeletal: Nontender with normal range of motion in all extremities. No joint effusions.  No lower extremity tenderness.  No edema. Neurologic:  No facial droop. Slow to answer questions, but overall normal speech and language. No gross or focal neurologic deficits are appreciated. Skin:  Skin is warm, dry and intact. No rash noted. Psychiatric: Mood and affect are normal. Speech and behavior are normal. Patient exhibits appropriate insight and judgment.   ____________________________________________  LABS (pertinent positives/negatives)  Labs Reviewed  COMPREHENSIVE METABOLIC PANEL - Abnormal; Notable for the following:       Result Value   Sodium  130 (*)    Chloride 87 (*)    CO2 34 (*)    Glucose, Bld 126 (*)    BUN 36 (*)    Creatinine, Ser 1.18 (*)    Total Protein 6.1 (*)    Albumin 2.9 (*)    AST 48 (*)  Total Bilirubin 2.7 (*)    GFR calc non Af Amer 46 (*)    GFR calc Af Amer 53 (*)    All other components within normal limits  CBC WITH DIFFERENTIAL/PLATELET - Abnormal; Notable for the following:    WBC 18.0 (*)    RBC 2.43 (*)    Hemoglobin 7.7 (*)    HCT 23.6 (*)    RDW 20.1 (*)    Neutro Abs 16.0 (*)    All other components within normal limits  LACTIC ACID, PLASMA - Abnormal; Notable for the following:    Lactic Acid, Venous 2.3 (*)    All other components within normal limits  BLOOD GAS, VENOUS - Abnormal; Notable for the following:    pH, Ven 7.46 (*)    Bicarbonate 39.8 (*)    Acid-Base Excess 14.2 (*)    All other components within normal limits  CULTURE, BLOOD (ROUTINE X 2)  CULTURE, BLOOD (ROUTINE X 2)  URINE CULTURE  LACTIC ACID, PLASMA  URINALYSIS, COMPLETE (UACMP) WITH MICROSCOPIC    ____________________________________________    EKG I, Lisa Roca, MD, the attending physician have personally viewed and interpreted all ECGs.  107 bpm. Sinus tachycardia. Narrow QRS. Normal axis. Nonspecific ST and T-wave ____________________________________________  RADIOLOGY All Xrays were viewed by me. Imaging interpreted by Radiologist.  Chest x-ray one view portable:  IMPRESSION: Small left pleural effusion with mild left basilar atelectasis.  No frank interstitial edema.  __________________________________________  PROCEDURES  Procedure(s) performed: None  Critical Care performed: CRITICAL CARE Performed by: Lisa Roca   Total critical care time: 30 minutes  Critical care time was exclusive of separately billable procedures and treating other patients.  Critical care was necessary to treat or prevent imminent or life-threatening deterioration.  Critical care was time spent  personally by me on the following activities: development of treatment plan with patient and/or surrogate as well as nursing, discussions with consultants, evaluation of patient's response to treatment, examination of patient, obtaining history from patient or surrogate, ordering and performing treatments and interventions, ordering and review of laboratory studies, ordering and review of radiographic studies, pulse oximetry and re-evaluation of patient's condition.   ____________________________________________   ED COURSE / ASSESSMENT AND PLAN  Pertinent labs & imaging results that were available during my care of the patient were reviewed by me and considered in my medical decision making (see chart for details).   Ms. Chiles returns to the ED after discharge from the hospital with COPD exacerbation/pneumonia about a week ago, has been worsening at home despite 24-hour caregiving in terms of dyspnea and hypoxia. She is wheezing here and was started on DuoNeb as well as Solu-Medrol. Next the next line tachycardic here also tachypneic with hypoxia which was better on nonrebreather and then later reduced to 6 L nasal cannula, concerning for CHF versus COPD versus pneumonia.  Given low-grade temperature, tachycardia, hypoxia, elevated white blood cell count, I am going to cover for possible sepsis with hospital acquired pneumonia antibiotics after blood cultures.  Chest x-ray without focal pneumonia or obvious pulmonary edema. Suspect COPD is the most likely concerning factor, but we'll treat for sepsis as well. Lactate slightly elevated 2.3, given 1 L of fluid, blood pressures borderline around 409 systolic and will continue to monitor.     CONSULTATIONS:  Hospitalist for admission Patient / Family / Caregiver informed of clinical course, medical decision-making process, and agree with plan.  __________________________________________   FINAL CLINICAL IMPRESSION(S) / ED  DIAGNOSES  Final diagnoses:  Acute renal failure, unspecified acute renal failure type ([place])  COPD exacerbation ([place])  Sepsis, due to unspecified organism [facility])              Note: This dictation was prepared with Dragon dictation. Any transcriptional errors that result from this process are unintentional     Lisa Roca, MD 07/15/16 1328

## 2016-07-15 NOTE — Progress Notes (Signed)
Pt has a stage 2 pressure ulcer on her right buttock with moisture associated skin breakdown, the patient and family are stating that her wound is from her previous stay in the hospital while wearing and changing out the pink foam. The family and patient have stated they do not want pink foam on the patient in fear of worsening her skin condition. Patient is bed bound at home, lives at home with daughter and has 24hr caregivers.

## 2016-07-15 NOTE — Telephone Encounter (Signed)
Please call dtgr  Would you also see note I sent to [facility] regarding patients weights ( past couple of days) , BP, Sa02  Need info  Haven't seen pt since hospital and unaware of they changed Dose of hctz.  Would daughter accompany patient on hospital follow up appt next week ?  We need an OV. Please tell daughter to bring all meds so we can do med review

## 2016-07-15 NOTE — Progress Notes (Signed)
Pt. VSS, resting in room.  Pt. Allowed RN to place sacral foam to help reduce pressure on buttocks. No care concerns at this time. Report given to Imma, RN- handing over care at this time.

## 2016-07-15 NOTE — Care Management Note (Signed)
Case Management Note  Patient Details  Name: [name] MRN: [id number] Date of Birth: 05/17/1945  Subjective/Objective:   Call from Dayna Barker verifies the patient gets Palliative Home care services. Patient remains SOB, and hypoxic here in the ER.                 Action/Plan:   Expected Discharge Date:                  Expected Discharge Plan:     In-House Referral:     Discharge planning Services     Post Acute Care Choice:    Choice offered to:     DME Arranged:    DME Agency:     HH Arranged:    HH Agency:     Status of Service:     If discussed at [employer] of Stay Meetings, dates discussed:    Additional Comments:  Berna Bue, RN 07/15/2016, 1:25 PM

## 2016-07-15 NOTE — H&P (Signed)
[facility] [place] NAME: [name]    MR#:  [id number]  DATE OF BIRTH:  October 17, 1945  DATE OF ADMISSION:  07/15/2016  PRIMARY CARE PHYSICIAN: Burnard Hawthorne, FNP   REQUESTING/REFERRING PHYSICIAN: Reita Cliche  CHIEF COMPLAINT:   Chief Complaint  Patient presents with  . Shortness of Breath    HISTORY OF PRESENT ILLNESS: [name]  is a 71 y.o. female with a known history of CHF, DM, Htn, Asthma, PVD, CVA, bedbound for last few months and have 24x7 care taker. For last 2 months- repeated admissions for respi issues, was here last week for COPD- sent home 5 days ago with tapering steroids, keflex. She felt better for 3 days. Again for last 2 days started feeling SOB, cough, weaker, required higher oxygen, so brought to ER.  Noted rise in WBCs, Hypoxic and tachypneic. Found to have new pneumonia on Xray chest.  PAST MEDICAL HISTORY:   Past Medical History:  Diagnosis Date  . Anemia   . Asthma   . CHF (congestive heart failure) ([place])   . CVA (cerebral vascular accident) ([place])   . Diabetes mellitus without complication ([place])   . GERD (gastroesophageal reflux disease)   . Heart murmur   . History of hiatal hernia   . Hypertension   . Panic attacks   . Peripheral vascular disease ([place])   . Restless leg syndrome   . Shortness of breath dyspnea     PAST SURGICAL HISTORY: Past Surgical History:  Procedure Laterality Date  . ABDOMINAL HYSTERECTOMY    . BREAST BIOPSY Right yrs ago   benign  . ENDARTERECTOMY Right 07/24/2014   Procedure: ENDARTERECTOMY CAROTID;  Surgeon: Algernon Huxley, MD;  Location: [facility] ORS;  Service: Vascular;  Laterality: Right;  . EYE SURGERY Left    cataract  . FRACTURE SURGERY Left    fractured ankle  . TONSILLECTOMY      SOCIAL HISTORY:  Social History  Substance Use Topics  . Smoking status: Never Smoker  . Smokeless tobacco: Never Used  . Alcohol use No    FAMILY HISTORY:  Family History  Problem  Relation Age of Onset  . Pancreatic cancer Mother   . CAD Father   . Hypertension Father   . Pancreatic cancer Father   . Colon cancer Father   . CAD Brother   . Heart disease Brother        Heart attack  . Breast cancer Maternal Aunt        pt states several maternal aunts    DRUG ALLERGIES:  Allergies  Allergen Reactions  . Codeine Other (See Comments)    Reaction:  Dizziness   . Liraglutide     Other reaction(s): nausea and stomach pain    REVIEW OF SYSTEMS:   CONSTITUTIONAL: No fever,positive for fatigue or weakness.  EYES: No blurred or double vision.  EARS, NOSE, AND THROAT: No tinnitus or ear pain.  RESPIRATORY: positive for cough, shortness of breath, wheezing or hemoptysis.  CARDIOVASCULAR: No chest pain, orthopnea, edema.  GASTROINTESTINAL: No nausea, vomiting, diarrhea or abdominal pain.  GENITOURINARY: No dysuria, hematuria.  ENDOCRINE: No polyuria, nocturia,  HEMATOLOGY: No anemia, easy bruising or bleeding SKIN: No rash or lesion. MUSCULOSKELETAL: No joint pain or arthritis.   NEUROLOGIC: No tingling, numbness, weakness.  PSYCHIATRY: No anxiety or depression.   MEDICATIONS AT HOME:  Prior to Admission medications   Medication Sig Start Date End Date Taking? Authorizing Provider  acetaminophen (  TYLENOL) 325 MG tablet Take 2 tablets (650 mg total) by mouth every 6 (six) hours as needed for mild pain (or Fever >/= 101). 12/14/15  Yes Gouru, Illene Silver, MD  albuterol (PROVENTIL) (2.5 MG/3ML) 0.083% nebulizer solution Take 3 mLs (2.5 mg total) by nebulization every 4 (four) hours as needed for wheezing. 07/05/16  Yes Dustin Flock, MD  aspirin EC 81 MG tablet Take 81 mg by mouth daily.   Yes [provider]  benzonatate (TESSALON) 100 MG capsule Take 1 capsule (100 mg total) by mouth 3 (three) times daily as needed for cough. 07/05/16  Yes Dustin Flock, MD  Cholecalciferol 1000 units capsule Take 1,000 Units by mouth daily.   Yes [provider]   Cyanocobalamin (B-12) 1000 MCG/ML KIT Inject 1,000 mcg as directed every 30 (thirty) days. 02/02/16  Yes [provider]  dapsone 100 MG tablet Take 150 mg by mouth daily.    Yes [provider]  ferrous sulfate 325 (65 FE) MG tablet Take 1 tablet (325 mg total) by mouth daily with breakfast. 11/03/15  Yes Arnett, Yvetta Coder, FNP  folic acid (FOLVITE) 1 MG tablet Take 1 mg by mouth daily. Take one pill daily on Tuesday, Wednesday, Thursday, Friday, Saturday and Sunday. These are days were patient isn't taking methotrexate   Yes [provider]  gabapentin (NEURONTIN) 800 MG tablet Take 800 mg by mouth 2 (two) times daily.   Yes [provider]  guaiFENesin (MUCINEX) 600 MG 12 hr tablet Take 1 tablet (600 mg total) by mouth 2 (two) times daily. 07/08/16  Yes Theodoro Grist, MD  hydrochlorothiazide (HYDRODIURIL) 12.5 MG tablet Take 12.5 mg by mouth 2 (two) times daily.    Yes [provider]  insulin glargine (LANTUS) 100 UNIT/ML injection Inject 0.2 mLs (20 Units total) into the skin at bedtime. 06/02/16  Yes Demetrios Loll, MD  lovastatin (MEVACOR) 20 MG tablet Take 20 mg by mouth at bedtime.   Yes [provider]  magnesium oxide (MAG-OX) 400 (241.3 Mg) MG tablet Take 1 tablet (400 mg total) by mouth 2 (two) times daily. 06/25/16  Yes Fritzi Mandes, MD  methotrexate (RHEUMATREX) 2.5 MG tablet Take 10 mg by mouth once a week. Patient takes on Monday. Caution:Chemotherapy. Protect from light.   Yes [provider]  Multiple Vitamins-Minerals (MULTIVITAL) tablet Take 1 tablet by mouth daily.   Yes [provider]  omeprazole (PRILOSEC) 20 MG capsule Take 1 capsule (20 mg total) by mouth at bedtime. 03/09/16  Yes Arnett, Yvetta Coder, FNP  potassium chloride SA (K-DUR,KLOR-CON) 20 MEQ tablet Take 2 tablets (40 mEq total) by mouth 2 (two) times daily. 06/02/16  Yes Demetrios Loll, MD  predniSONE (DELTASONE) 10 MG tablet Take 1 tablet (10 mg total) by  mouth daily with breakfast. Please take 6 pills in the morning on the day 1 and 2, then taper by one pill every 2 days until finished, thank you 07/09/16  Yes Theodoro Grist, MD  ramipril (ALTACE) 2.5 MG capsule Take 1 capsule (2.5 mg total) by mouth daily. 06/03/16  Yes Demetrios Loll, MD  tiotropium (SPIRIVA) 18 MCG inhalation capsule Place 1 capsule (18 mcg total) into inhaler and inhale daily. 07/09/16  Yes Theodoro Grist, MD  torsemide (DEMADEX) 20 MG tablet Take 2 tablets (40 mg total) by mouth 2 (two) times daily. 07/05/16 08/04/16 Yes Dustin Flock, MD  Wound Dressings (SILVASORB) GEL Apply 1 application topically 2 (two) times daily as needed. 07/05/16  Yes Dustin Flock,  MD  Amino Acids-Protein Hydrolys (FEEDING SUPPLEMENT, PRO-STAT SUGAR FREE 64,) LIQD Take 30 mLs by mouth daily. 07/09/16   Theodoro Grist, MD  insulin aspart (NOVOLOG) 100 UNIT/ML injection Inject 10 Units into the skin 3 (three) times daily before meals. 301-350+ 10 units, 351 or higher NOTIFY MD    [provider]      PHYSICAL EXAMINATION:   VITAL SIGNS: Blood pressure (!) 98/47, pulse 95, temperature 99.1 F (37.3 C), temperature source Oral, resp. rate (!) 24, height 5' 2"  (1.575 m), weight 97.5 kg (215 lb), SpO2 92 %.  GENERAL:  71 y.o.-year-old obese patient lying in the bed with no acute distress.  EYES: Pupils equal, round, reactive to light and accommodation. No scleral icterus. Extraocular muscles intact.  HEENT: Head atraumatic, normocephalic. Oropharynx and nasopharynx clear.  NECK:  Supple, no jugular venous distention. No thyroid enlargement, no tenderness.  LUNGS: Normal breath sounds bilaterally, some wheezing, some crepitation. No use of accessory muscles of respiration.  CARDIOVASCULAR: S1, S2 normal. No murmurs, rubs, or gallops.  ABDOMEN: Soft, nontender, nondistended. Bowel sounds present. No organomegaly or mass.  EXTREMITIES: No pedal edema, cyanosis, or clubbing.  NEUROLOGIC: Cranial nerves II  through XII are intact. Muscle strength 4/5 in upper extremities 2-3/ 5 in lower extremities. Sensation intact. Gait not checked.  PSYCHIATRIC: The patient is alert and oriented x 3.  SKIN: No obvious rash, lesion, or ulcer.   LABORATORY PANEL:   CBC  Recent Labs Lab 07/15/16 1139  WBC 18.0*  HGB 7.7*  HCT 23.6*  PLT 187  MCV 97.1  MCH 31.5  MCHC 32.5  RDW 20.1*  LYMPHSABS 1.0  MONOABS 0.9  EOSABS 0.0  BASOSABS 0.0   ------------------------------------------------------------------------------------------------------------------  Chemistries   Recent Labs Lab 07/15/16 1139  NA 130*  K 3.6  CL 87*  CO2 34*  GLUCOSE 126*  BUN 36*  CREATININE 1.18*  CALCIUM 8.9  AST 48*  ALT 31  ALKPHOS 126  BILITOT 2.7*   ------------------------------------------------------------------------------------------------------------------ estimated creatinine clearance is 48.4 mL/min (A) (by C-G formula based on SCr of 1.18 mg/dL (H)). ------------------------------------------------------------------------------------------------------------------ No results for input(s): TSH, T4TOTAL, T3FREE, THYROIDAB in the last 72 hours.  Invalid input(s): FREET3   Coagulation profile No results for input(s): INR, PROTIME in the last 168 hours. ------------------------------------------------------------------------------------------------------------------- No results for input(s): DDIMER in the last 72 hours. -------------------------------------------------------------------------------------------------------------------  Cardiac Enzymes No results for input(s): CKMB, TROPONINI, MYOGLOBIN in the last 168 hours.  Invalid input(s): CK ------------------------------------------------------------------------------------------------------------------ Invalid input(s):  POCBNP  ---------------------------------------------------------------------------------------------------------------  Urinalysis    Component Value Date/Time   COLORURINE YELLOW (A) 07/07/2016 1329   APPEARANCEUR CLEAR (A) 07/07/2016 1329   LABSPEC 1.007 07/07/2016 1329   PHURINE 7.0 07/07/2016 1329   GLUCOSEU >=500 (A) 07/07/2016 1329   HGBUR NEGATIVE 07/07/2016 1329   BILIRUBINUR NEGATIVE 07/07/2016 1329   BILIRUBINUR negative 04/25/2016 1355   KETONESUR NEGATIVE 07/07/2016 1329   PROTEINUR NEGATIVE 07/07/2016 1329   UROBILINOGEN 0.2 04/25/2016 1355   NITRITE NEGATIVE 07/07/2016 1329   LEUKOCYTESUR NEGATIVE 07/07/2016 1329     RADIOLOGY: Dg Chest 2 View  Result Date: 07/15/2016 CLINICAL DATA:  Shortness of breath. EXAM: CHEST  2 VIEW COMPARISON:  07/08/2016 and prior chest radiographs FINDINGS: Cardiomegaly and pulmonary vascular congestion noted. Mild interstitial opacities are again identified. New left lower lobe airspace disease/consolidation noted with small left pleural effusion again noted. Mild right basilar opacity may represent atelectasis versus airspace disease. There is no evidence of pneumothorax. No acute bony abnormalities are noted.  IMPRESSION: New left lower lobe airspace disease/consolidation which may represent pneumonia. Small left pleural effusion and mild right basilar atelectasis versus airspace disease. Cardiomegaly and pulmonary vascular congestion. Unchanged mild interstitial opacities. Electronically Signed   By: Margarette Canada M.D.   On: 07/15/2016 13:14    EKG: Orders placed or performed during the hospital encounter of 07/15/16  . ED EKG  . ED EKG  . EKG 12-Lead  . EKG 12-Lead    IMPRESSION AND PLAN:  * Ac on ch respi failure   HCAP   COPD exacerbation    IV vanc + cefepime, check MRSA screen.    Try to get sputum culture   Solumedrol, Duoneb, spiriva    On home o2 at 3 ltr/min, try to taper up to that level.  * lactic acidosis   Due  to above, monitor.  * Hypertension    Hold meds as BP is running low normal.   All the records are reviewed and case discussed with ED provider. Management plans discussed with the patient, family and they are in agreement.  CODE STATUS: Full. Code Status History    Date Active Date Inactive Code Status Order ID Comments User Context   07/07/2016  5:30 PM 07/09/2016  4:20 PM Full Code [id number]  Theodoro Grist, MD Inpatient   06/30/2016 10:36 PM 07/05/2016  7:13 PM Full Code [id number]  Demetrios Loll, MD Inpatient   06/23/2016  1:12 AM 06/25/2016  2:20 PM Full Code [id number]  Hugelmeyer, Pence, DO Inpatient   05/31/2016  1:26 AM 06/02/2016  8:27 PM Full Code [id number]  Max Sane, MD Inpatient   12/10/2015  4:55 PM 12/14/2015  9:13 PM Full Code [id number]  Hillary Bow, MD ED   03/13/2015  7:03 PM 03/15/2015  6:55 PM Full Code [id number]  [name], MD Inpatient   07/24/2014  5:53 PM 07/25/2014  6:55 PM Full Code [id number]  Algernon Huxley, MD Inpatient     Her daughter and caretaker were present in room.  TOTAL TIME TAKING CARE OF THIS PATIENT: 50 minutes.    [name] M.D on 07/15/2016   Between 7am to 6pm - Pager - [phone]  After 6pm go to [email or web address] - password [email or web address] [facility]  Office  [phone]  CC: Primary care physician; Burnard Hawthorne, FNP   Note: This dictation was prepared with Dragon dictation along with smaller phrase technology. Any transcriptional errors that result from this process are unintentional.

## 2016-07-15 NOTE — Telephone Encounter (Signed)
Please advise, thanks.

## 2016-07-15 NOTE — ED Notes (Signed)
Informed Md lord of critical lactic Acid 2.3

## 2016-07-15 NOTE — Telephone Encounter (Signed)
Daughter has requested  to have ramipril and hydrochlorothiazide refilled . She requested a call to discuss the change in dose for pt's fluid pill, or have the new script sent to the pharmacy  Pharmacy [facility] [phone]

## 2016-07-15 NOTE — Progress Notes (Signed)
Family Meeting Note  Advance Directive:yes  Today a meeting took place with the Patient and daughter.   The following clinical team members were present during this meeting:MD  The following were discussed:Patient's diagnosis: recurrent pneumonia, COPD, CHF, Patient's progosis: Unable to determine and Goals for treatment: Full Code  Additional follow-up to be provided: PMD  Time spent during discussion:20 minutes  Nicholous Girgenti, Heath Gold, MD

## 2016-07-15 NOTE — Telephone Encounter (Signed)
Would you call and follow up with pt-   Has she spoken with heart failure clinic?  What are her weights over past week? Has she had a gain?  How about BP, saO2?  I need these metrics to decide if we should increase lasix.

## 2016-07-15 NOTE — Progress Notes (Signed)
Pt. CBG increased, paged and spoke with Dr. Anne Hahn r/t insulin orders and diet orders.   MD updated sliding scale to include coverage at HS, and changed diet order to include carb modified.

## 2016-07-15 NOTE — ED Triage Notes (Signed)
Pt arrives to ER via EMS from home after home health nurse found pt oxygen to be in 80's. Pt wears 2-3L [place] chronically. On EMS arrival, pt placed on 6L [place] with oxygen still in 80's. Pt placed on NRB and brought to hospital. Pt has had multiple hospital stays recently. Pt alert and oriented X 4 at this time.

## 2016-07-15 NOTE — Progress Notes (Signed)
Pharmacy Antibiotic Note  [name] is a 71 y.o. female admitted on 07/15/2016 with HCAP.  Pharmacy has been consulted for vancomycin and Zosyn dosing.  Plan: Patient received vancomycin and cefepime in ED. MRSA and Procalcitonin levels are pending.   Will continue patient on vancomycin 1250mg  IV Q24hr for goal trough of 15-20. Will obtain trough prior to 5th dose of vancomycin.    Will continue patient on cefepime 2g IV Q24hr.    Height: 5\' 2"  (157.5 cm) Weight: 215 lb (97.5 kg) IBW/kg (Calculated) : 50.1  Temp (24hrs), Avg:99.1 F (37.3 C), Min:99.1 F (37.3 C), Max:99.1 F (37.3 C)   Recent Labs Lab 07/08/16 1835 07/15/16 1139 07/15/16 1451  WBC  --  18.0*  --   CREATININE  --  1.18*  --   LATICACIDVEN 1.6 2.3* 1.6    Estimated Creatinine Clearance: 48.4 mL/min (A) (by C-G formula based on SCr of 1.18 mg/dL (H)).    Allergies  Allergen Reactions  . Codeine Other (See Comments)    Reaction:  Dizziness   . Liraglutide     Other reaction(s): nausea and stomach pain    Antimicrobials this admission: Cefepime 5/18 >>  Vancomycin 5/18 >>   Dose adjustments this admission: N/A  Microbiology results: 5/18 BCx: pending  5/18 UCx: pending  5/18 Sputum: pending  5/19 MRSA PCR: pending   Thank you for allowing pharmacy to be a part of this patient's care.  Simpson,Michael L 07/15/2016 3:41 PM

## 2016-07-15 NOTE — Telephone Encounter (Signed)
Tried calling patient and caregiver states that patient is on her way to hospital due to congestion and low O2 stats  .   Spoke with [facility]  Patient having increased  congestion on 82 %  4L of oxygen.  She was unaware if anyone checked O 2 stats yesterday.   Patients care giver did contact heart failure clinic.

## 2016-07-15 NOTE — Telephone Encounter (Signed)
Baxter Hire have you called this patient yet?

## 2016-07-15 NOTE — Telephone Encounter (Signed)
Left voice mail to call back 

## 2016-07-15 NOTE — ED Notes (Signed)
CODE  SEPSIS  CALLED  TO  CARELINK 

## 2016-07-16 DIAGNOSIS — L89150 Pressure ulcer of sacral region, unstageable: Secondary | ICD-10-CM | POA: Diagnosis present

## 2016-07-16 LAB — BASIC METABOLIC PANEL
Anion gap: 9 (ref 5–15)
BUN: 32 mg/dL — ABNORMAL HIGH (ref 6–20)
CALCIUM: 8 mg/dL — AB (ref 8.9–10.3)
CO2: 33 mmol/L — AB (ref 22–32)
CREATININE: 1 mg/dL (ref 0.44–1.00)
Chloride: 88 mmol/L — ABNORMAL LOW (ref 101–111)
GFR calc non Af Amer: 56 mL/min — ABNORMAL LOW (ref >60)
Glucose, Bld: 354 mg/dL — ABNORMAL HIGH (ref 65–99)
Potassium: 3.4 mmol/L — ABNORMAL LOW (ref 3.5–5.1)
SODIUM: 130 mmol/L — AB (ref 135–145)

## 2016-07-16 LAB — GLUCOSE, CAPILLARY
GLUCOSE-CAPILLARY: 324 mg/dL — AB (ref 65–99)
GLUCOSE-CAPILLARY: 408 mg/dL — AB (ref 65–99)
GLUCOSE-CAPILLARY: 323 mg/dL — AB (ref 65–99)
Glucose-Capillary: 330 mg/dL — ABNORMAL HIGH (ref 65–99)
Glucose-Capillary: 372 mg/dL — ABNORMAL HIGH (ref 65–99)

## 2016-07-16 LAB — CBC
HCT: 20.1 % — ABNORMAL LOW (ref 35.0–47.0)
Hemoglobin: 6.8 g/dL — ABNORMAL LOW (ref 12.0–16.0)
MCH: 33.3 pg (ref 26.0–34.0)
MCHC: 33.7 g/dL (ref 32.0–36.0)
MCV: 98.9 fL (ref 80.0–100.0)
Platelets: 130 10*3/uL — ABNORMAL LOW (ref 150–440)
RBC: 2.03 MIL/uL — AB (ref 3.80–5.20)
RDW: 18.7 % — ABNORMAL HIGH (ref 11.5–14.5)
WBC: 9.4 10*3/uL (ref 3.6–11.0)

## 2016-07-16 LAB — URINE CULTURE

## 2016-07-16 LAB — PREPARE RBC (CROSSMATCH)

## 2016-07-16 MED ORDER — CEFEPIME HCL 2 G IJ SOLR
2.0000 g | Freq: Two times a day (BID) | INTRAMUSCULAR | Status: DC
  Administered 2016-07-16: 03:00:00 2 g via INTRAVENOUS
  Filled 2016-07-16 (×2): qty 2

## 2016-07-16 MED ORDER — INSULIN ASPART 100 UNIT/ML ~~LOC~~ SOLN
5.0000 [IU] | Freq: Three times a day (TID) | SUBCUTANEOUS | Status: DC
  Administered 2016-07-16 – 2016-07-19 (×9): 5 [IU] via SUBCUTANEOUS
  Filled 2016-07-16 (×4): qty 5
  Filled 2016-07-16: qty 7
  Filled 2016-07-16 (×5): qty 5

## 2016-07-16 MED ORDER — METHYLPREDNISOLONE SODIUM SUCC 125 MG IJ SOLR
60.0000 mg | Freq: Two times a day (BID) | INTRAMUSCULAR | Status: DC
  Administered 2016-07-16: 21:00:00 60 mg via INTRAVENOUS
  Filled 2016-07-16: qty 2

## 2016-07-16 MED ORDER — DEXTROSE 5 % IV SOLN
2.0000 g | Freq: Three times a day (TID) | INTRAVENOUS | Status: DC
  Filled 2016-07-16 (×2): qty 2

## 2016-07-16 MED ORDER — DEXTROSE 5 % IV SOLN
2.0000 g | Freq: Two times a day (BID) | INTRAVENOUS | Status: DC
  Administered 2016-07-16 – 2016-07-18 (×5): 2 g via INTRAVENOUS
  Filled 2016-07-16 (×8): qty 2

## 2016-07-16 MED ORDER — SODIUM CHLORIDE 0.9 % IV SOLN
Freq: Once | INTRAVENOUS | Status: AC
  Administered 2016-07-16: 14:00:00 via INTRAVENOUS

## 2016-07-16 MED ORDER — POTASSIUM CHLORIDE CRYS ER 20 MEQ PO TBCR
20.0000 meq | EXTENDED_RELEASE_TABLET | Freq: Two times a day (BID) | ORAL | Status: AC
  Administered 2016-07-16 – 2016-07-17 (×4): 20 meq via ORAL
  Filled 2016-07-16 (×4): qty 1

## 2016-07-16 MED ORDER — IPRATROPIUM-ALBUTEROL 0.5-2.5 (3) MG/3ML IN SOLN
3.0000 mL | Freq: Four times a day (QID) | RESPIRATORY_TRACT | Status: DC
  Administered 2016-07-16 – 2016-07-19 (×14): 3 mL via RESPIRATORY_TRACT
  Filled 2016-07-16 (×14): qty 3

## 2016-07-16 NOTE — Progress Notes (Signed)
Pharmacy Antibiotic Note  [name] is a 71 y.o. female admitted on 07/15/2016 with HCAP.  Pharmacy has been consulted for vancomycin and Zosyn dosing.  Plan:  Will continue patient on vancomycin 1250mg  IV Q24hr for goal trough of 15-20. Will obtain trough prior to 5th dose of vancomycin. MRSA PCR is negative. Will f/u with hospitalist to d/c vancomycin.   Will increase cefepime dosing to 2 g iv q 12 hours.   Height: 5\' 2"  (157.5 cm) Weight: 238 lb 11.2 oz (108.3 kg) IBW/kg (Calculated) : 50.1  Temp (24hrs), Avg:98.3 F (36.8 C), Min:97.9 F (36.6 C), Max:99.1 F (37.3 C)   Recent Labs Lab 07/15/16 1139 07/15/16 1451 07/16/16 0526  WBC 18.0*  --  9.4  CREATININE 1.18*  --  1.00  LATICACIDVEN 2.3* 1.6  --     Estimated Creatinine Clearance: 60.7 mL/min (by C-G formula based on SCr of 1 mg/dL).    Allergies  Allergen Reactions  . Codeine Other (See Comments)    Reaction:  Dizziness   . Liraglutide     Other reaction(s): nausea and stomach pain    Antimicrobials this admission: Cefepime 5/18 >>  Vancomycin 5/18 >>   Dose adjustments this admission: N/A  Microbiology results: 5/18 BCx: sent 5/18 UCx: sent 5/18 Sputum: pending  5/19 MRSA PCR: negative  Thank you for allowing pharmacy to be a part of this patient's care.  6/18 D 07/16/2016 8:12 AM

## 2016-07-16 NOTE — Progress Notes (Signed)
[facility] [place] [facility]   PATIENT NAME: [name]    MR#:  [id number]  DATE OF BIRTH:  1945/12/26  SUBJECTIVE:  CHIEF COMPLAINT:   Chief Complaint  Patient presents with  . Shortness of Breath     Came with SOB, found to have new PNA, on broad spectrum Abx for that.    Hb dropped < 7.  Hb was > 12 in jan 2018, gradual drop over last 4-5 months.  REVIEW OF SYSTEMS:  CONSTITUTIONAL: No fever, fatigue or weakness.  EYES: No blurred or double vision.  EARS, NOSE, AND THROAT: No tinnitus or ear pain.  RESPIRATORY: No cough,positive for shortness of breath, wheezing or hemoptysis.  CARDIOVASCULAR: No chest pain, orthopnea, edema.  GASTROINTESTINAL: No nausea, vomiting, diarrhea or abdominal pain.  GENITOURINARY: No dysuria, hematuria.  ENDOCRINE: No polyuria, nocturia,  HEMATOLOGY: No anemia, easy bruising or bleeding SKIN: No rash or lesion. MUSCULOSKELETAL: No joint pain or arthritis.   NEUROLOGIC: No tingling, numbness, weakness.  PSYCHIATRY: No anxiety or depression.   ROS  DRUG ALLERGIES:   Allergies  Allergen Reactions  . Codeine Other (See Comments)    Reaction:  Dizziness   . Liraglutide     Other reaction(s): nausea and stomach pain    VITALS:  Blood pressure (!) 107/55, pulse 80, temperature 97.7 F (36.5 C), temperature source Oral, resp. rate 18, height 5\' 2"  (1.575 m), weight 108.3 kg (238 lb 11.2 oz), SpO2 96 %.  PHYSICAL EXAMINATION:   GENERAL:  71 y.o.-year-old obese patient lying in the bed with no acute distress.  EYES: Pupils equal, round, reactive to light and accommodation. No scleral icterus. Extraocular muscles intact.  HEENT: Head atraumatic, normocephalic. Oropharynx and nasopharynx clear.  NECK:  Supple, no jugular venous distention. No thyroid enlargement, no tenderness.  LUNGS: Normal breath sounds bilaterally, some wheezing, some crepitation. No use of accessory muscles of respiration.  CARDIOVASCULAR: S1, S2  normal. No murmurs, rubs, or gallops.  ABDOMEN: Soft, nontender, nondistended. Bowel sounds present. No organomegaly or mass.  EXTREMITIES: No pedal edema, cyanosis, or clubbing.  NEUROLOGIC: Cranial nerves II through XII are intact. Muscle strength 4/5 in upper extremities 2-3/ 5 in lower extremities. Sensation intact. Gait not checked.  PSYCHIATRIC: The patient is alert and oriented x 3.  SKIN: pressure ulcer in her sacral area. Physical Exam LABORATORY PANEL:   CBC  Recent Labs Lab 07/16/16 0526  WBC 9.4  HGB 6.8*  HCT 20.1*  PLT 130*   ------------------------------------------------------------------------------------------------------------------  Chemistries   Recent Labs Lab 07/15/16 1139 07/16/16 0526  NA 130* 130*  K 3.6 3.4*  CL 87* 88*  CO2 34* 33*  GLUCOSE 126* 354*  BUN 36* 32*  CREATININE 1.18* 1.00  CALCIUM 8.9 8.0*  AST 48*  --   ALT 31  --   ALKPHOS 126  --   BILITOT 2.7*  --    ------------------------------------------------------------------------------------------------------------------  Cardiac Enzymes No results for input(s): TROPONINI in the last 168 hours. ------------------------------------------------------------------------------------------------------------------  RADIOLOGY:  Dg Chest 2 View  Result Date: 07/15/2016 CLINICAL DATA:  Shortness of breath. EXAM: CHEST  2 VIEW COMPARISON:  07/08/2016 and prior chest radiographs FINDINGS: Cardiomegaly and pulmonary vascular congestion noted. Mild interstitial opacities are again identified. New left lower lobe airspace disease/consolidation noted with small left pleural effusion again noted. Mild right basilar opacity may represent atelectasis versus airspace disease. There is no evidence of pneumothorax. No acute bony abnormalities are noted. IMPRESSION: New left lower lobe airspace disease/consolidation  which may represent pneumonia. Small left pleural effusion and mild right basilar  atelectasis versus airspace disease. Cardiomegaly and pulmonary vascular congestion. Unchanged mild interstitial opacities. Electronically Signed   By: Harmon Pier M.D.   On: 07/15/2016 13:14    ASSESSMENT AND PLAN:   Principal Problem:   HCAP (healthcare-associated pneumonia) Active Problems:   Acute on chronic respiratory failure with hypoxia (HCC)   Unstageable pressure ulcer of sacral region (HCC)  * Ac on ch respi failure   HCAP   COPD exacerbation    IV vanc + cefepime, checked MRSA screen- stop vanc.   Try to get sputum culture   Solumedrol, Duoneb, spiriva   On home o2 at 3 ltr/min, try to taper up to that level.  * lactic acidosis   Due to above, monitor.  * Hypertension    Hold meds as BP is running low normal.  * DM- uncontrolled   Due to steroids   Added long and short acting insulin.  * Anemia    Chronic drop in last 3-4 months    discussed with pt about possible side effects of blood transfusion- including more common like low grade fever to rare and serious like renal and lung involvement and infections.  She understood- and due to necessity of transfusion- agreed to receive the transfusion.   1 unit PRBC transfusion today.    Stool for guiac.    All the records are reviewed and case discussed with Care Management/Social Workerr. Management plans discussed with the patient, family and they are in agreement.  CODE STATUS: Full.  TOTAL TIME TAKING CARE OF THIS PATIENT: 35 minutes.     POSSIBLE D/C IN 1-2 DAYS, DEPENDING ON CLINICAL CONDITION.   Altamese Dilling M.D on 07/16/2016   Between 7am to 6pm - Pager - [phone]  After 6pm go to [email or web address] - password [email or web address]  [facility] [place] [facility]  Office  [phone]  CC: Primary care physician; Allegra Grana, FNP  Note: This dictation was prepared with Dragon dictation along with smaller phrase technology. Any transcriptional errors that result from this process are  unintentional.

## 2016-07-16 NOTE — Progress Notes (Signed)
Please note patient is currently receiving HOME PALLIATIVE services. Thank you. Dayna Barker RN, BSN, [facility], [facility] [place], [facility] [phone] c

## 2016-07-16 NOTE — Progress Notes (Signed)
Rechecked BP on 104/38 (Map 55) reported to MD. Pt is asymptomatic. Pt stated " well my BP always runs low". Pt has his of diastolic dysfunction. Will cont to monitor.

## 2016-07-17 LAB — GLUCOSE, CAPILLARY
GLUCOSE-CAPILLARY: 364 mg/dL — AB (ref 65–99)
Glucose-Capillary: 362 mg/dL — ABNORMAL HIGH (ref 65–99)
Glucose-Capillary: 448 mg/dL — ABNORMAL HIGH (ref 65–99)
Glucose-Capillary: 265 mg/dL — ABNORMAL HIGH (ref 65–99)

## 2016-07-17 LAB — PROCALCITONIN: Procalcitonin: 0.45 ng/mL

## 2016-07-17 LAB — BASIC METABOLIC PANEL
Anion gap: 9 (ref 5–15)
BUN: 32 mg/dL — AB (ref 6–20)
CHLORIDE: 87 mmol/L — AB (ref 101–111)
CO2: 33 mmol/L — AB (ref 22–32)
CREATININE: 1.21 mg/dL — AB (ref 0.44–1.00)
Calcium: 8.3 mg/dL — ABNORMAL LOW (ref 8.9–10.3)
GFR calc Af Amer: 51 mL/min — ABNORMAL LOW (ref >60)
GFR calc non Af Amer: 44 mL/min — ABNORMAL LOW (ref >60)
GLUCOSE: 397 mg/dL — AB (ref 65–99)
POTASSIUM: 3.6 mmol/L (ref 3.5–5.1)
SODIUM: 129 mmol/L — AB (ref 135–145)

## 2016-07-17 LAB — CBC
HEMATOCRIT: 27.1 % — AB (ref 35.0–47.0)
Hemoglobin: 9 g/dL — ABNORMAL LOW (ref 12.0–16.0)
MCH: 32.3 pg (ref 26.0–34.0)
MCHC: 33.3 g/dL (ref 32.0–36.0)
MCV: 97 fL (ref 80.0–100.0)
PLATELETS: 135 10*3/uL — AB (ref 150–440)
RBC: 2.8 MIL/uL — ABNORMAL LOW (ref 3.80–5.20)
RDW: 19.1 % — AB (ref 11.5–14.5)
WBC: 13.4 10*3/uL — ABNORMAL HIGH (ref 3.6–11.0)

## 2016-07-17 MED ORDER — INSULIN GLARGINE 100 UNIT/ML ~~LOC~~ SOLN
5.0000 [IU] | Freq: Once | SUBCUTANEOUS | Status: AC
  Administered 2016-07-17: 5 [IU] via SUBCUTANEOUS
  Filled 2016-07-17: qty 0.05

## 2016-07-17 MED ORDER — INSULIN GLARGINE 100 UNIT/ML ~~LOC~~ SOLN
25.0000 [IU] | Freq: Every day | SUBCUTANEOUS | Status: DC
  Administered 2016-07-17 – 2016-07-18 (×2): 25 [IU] via SUBCUTANEOUS
  Filled 2016-07-17 (×3): qty 0.25

## 2016-07-17 MED ORDER — POLYETHYLENE GLYCOL 3350 17 G PO PACK
17.0000 g | PACK | Freq: Every day | ORAL | Status: DC
  Administered 2016-07-17 – 2016-07-19 (×3): 17 g via ORAL
  Filled 2016-07-17 (×3): qty 1

## 2016-07-17 MED ORDER — PREDNISONE 50 MG PO TABS
50.0000 mg | ORAL_TABLET | Freq: Every day | ORAL | Status: DC
  Administered 2016-07-17 – 2016-07-19 (×3): 50 mg via ORAL
  Filled 2016-07-17 (×3): qty 1

## 2016-07-17 MED ORDER — GERHARDT'S BUTT CREAM
TOPICAL_CREAM | Freq: Three times a day (TID) | CUTANEOUS | Status: DC
  Administered 2016-07-17 – 2016-07-19 (×7): via TOPICAL
  Filled 2016-07-17: qty 1

## 2016-07-17 NOTE — Progress Notes (Signed)
Physical Therapy Evaluation Patient Details Name: [name] MRN: [id number] DOB: 1945-11-19 Today's Date: 07/17/2016   History of Present Illness  [name]  is a 71 y.o. female with a known history of CHF, DM, Htn, Asthma, PVD, CVA, bedbound for last few months and have 24x7 care taker.  Clinical Impression  Patient has limited mobility in bed and has pain in buttocks when she moves and declines bed mobility training today. She has fair strength in BLE hip/ knees and ankles, and did participate in LE strengthening today. She rolls in bed with mod assist supine to sidelying and declined sitting at the edge of bed or transfers sit to stand. She will benefit from skilled PT to improve mobility and strength.     Follow Up Recommendations Home health PT    Equipment Recommendations  None recommended by PT    Recommendations for Other Services       Precautions / Restrictions Precautions Precautions: Fall Restrictions Weight Bearing Restrictions: No      Mobility  Bed Mobility Overal bed mobility: Needs Assistance Bed Mobility: Rolling Rolling: Max assist   Supine to sit:  (Patient declined) Sit to supine:  (patient declined)      Transfers     Transfers:  (Patient declined)              Ambulation/Gait Ambulation/Gait assistance:  (Patient declined)              Information systems manager Rankin (Stroke Patients Only)       Balance                                             Pertinent Vitals/Pain Pain Assessment: 0-10 Pain Score: 10-Worst pain ever Pain Location: buttocks Pain Descriptors / Indicators: Aching Pain Intervention(s): Limited activity within patient's tolerance    Home Living Family/patient expects to be discharged to:: Private residence Living Arrangements: Non-relatives/Friends Available Help at Discharge: Personal care attendant;Available 24 hours/day Type of Home:  House Home Access: Ramped entrance     Home Layout: One level Home Equipment: Walker - 4 wheels;Wheelchair - manual;Transport chair;Hospital bed;Shower seat      Prior Function Level of Independence: Needs assistance   Gait / Transfers Assistance Needed: Mobility with  WC and SBA limited household distances; Associate Professor for community distances  ADL's / Homemaking Assistance Needed: Requires modA for ADL.         Hand Dominance   Dominant Hand: Right    Extremity/Trunk Assessment   Upper Extremity Assessment Upper Extremity Assessment: Overall WFL for tasks assessed    Lower Extremity Assessment Lower Extremity Assessment: Generalized weakness    Cervical / Trunk Assessment Cervical / Trunk Assessment: Normal  Communication   Communication: No difficulties  Cognition Arousal/Alertness: Awake/alert Behavior During Therapy: WFL for tasks assessed/performed Overall Cognitive Status: Within Functional Limits for tasks assessed                                        General Comments      Exercises Exercises Ankle Circles/Pumps: Strengthening;Both;5 reps;10 reps Quad Sets: Strengthening;Both;10 reps;5 reps Heel Slides: AAROM;Both;10 reps Hip ABduction/ADduction: AAROM;Both;10 reps Straight Leg Raises: AAROM;Both;10 reps  Assessment/Plan    PT Assessment Patient needs continued PT services  PT Problem List Decreased strength;Decreased activity tolerance;Decreased balance;Decreased mobility;Cardiopulmonary status limiting activity       PT Treatment Interventions Gait training;Functional mobility training;Balance training;Therapeutic exercise;Therapeutic activities;Patient/family education;DME instruction    PT Goals (Current goals can be found in the Care Plan section)  Acute Rehab PT Goals Patient Stated Goal: To return home  PT Goal Formulation: With patient Time For Goal Achievement: 07/31/16 Potential to Achieve Goals: Fair     Frequency Min 2X/week   Barriers to discharge        Co-evaluation               AM-PAC PT "6 Clicks" Daily Activity  Outcome Measure Difficulty turning over in bed (including adjusting bedclothes, sheets and blankets)?: A Little           6 Click Score: 3    End of Session Equipment Utilized During Treatment: Oxygen Activity Tolerance: Patient limited by fatigue;Patient limited by pain Patient left: in bed;with call bell/phone within reach        Time: 0945-1010 PT Time Calculation (min) (ACUTE ONLY): 25 min   Charges:   PT Evaluation $PT Eval Low Complexity: 1 Procedure PT Treatments $Therapeutic Exercise: 8-22 mins   PT G Codes:   PT G-Codes **NOT FOR INPATIENT CLASS** Functional Assessment Tool Used: Clinical judgement Functional Limitation: Mobility: Walking and moving around Mobility: Walking and Moving Around Current Status (D7824): At least 80 percent but less than 100 percent impaired, limited or restricted Mobility: Walking and Moving Around Goal Status [phone]): At least 80 percent but less than 100 percent impaired, limited or restricted    Ezekiel Ina, PT, DPT  Jones Broom S 07/17/2016, 10:58 AM

## 2016-07-17 NOTE — Progress Notes (Signed)
[facility] - [place] at [facility]   PATIENT NAME: [name]    MR#:  [id number]  DATE OF BIRTH:  03/05/1945  SUBJECTIVE:  CHIEF COMPLAINT:   Chief Complaint  Patient presents with  . Shortness of Breath     Came with SOB, found to have new PNA, on broad spectrum Abx for that.    Hb dropped < 7.  Hb was > 12 in jan 2018, gradual drop over last 4-5 months.   Feels better after transfusion. Still some cough, no BM.  REVIEW OF SYSTEMS:  CONSTITUTIONAL: No fever, fatigue or weakness.  EYES: No blurred or double vision.  EARS, NOSE, AND THROAT: No tinnitus or ear pain.  RESPIRATORY: No cough,positive for shortness of breath, wheezing or hemoptysis.  CARDIOVASCULAR: No chest pain, orthopnea, edema.  GASTROINTESTINAL: No nausea, vomiting, diarrhea or abdominal pain.  GENITOURINARY: No dysuria, hematuria.  ENDOCRINE: No polyuria, nocturia,  HEMATOLOGY: No anemia, easy bruising or bleeding SKIN: No rash or lesion. MUSCULOSKELETAL: No joint pain or arthritis.   NEUROLOGIC: No tingling, numbness, weakness.  PSYCHIATRY: No anxiety or depression.   ROS  DRUG ALLERGIES:   Allergies  Allergen Reactions  . Codeine Other (See Comments)    Reaction:  Dizziness   . Liraglutide     Other reaction(s): nausea and stomach pain    VITALS:  Blood pressure 118/69, pulse 86, temperature 97.5 F (36.4 C), temperature source Oral, resp. rate 16, height 5\' 2"  (1.575 m), weight 108.3 kg (238 lb 11.2 oz), SpO2 92 %.  PHYSICAL EXAMINATION:   GENERAL:  71 y.o.-year-old obese patient lying in the bed with no acute distress.  EYES: Pupils equal, round, reactive to light and accommodation. No scleral icterus. Extraocular muscles intact.  HEENT: Head atraumatic, normocephalic. Oropharynx and nasopharynx clear.  NECK:  Supple, no jugular venous distention. No thyroid enlargement, no tenderness.  LUNGS: Normal breath sounds bilaterally, some wheezing, some crepitation. No use of  accessory muscles of respiration.  CARDIOVASCULAR: S1, S2 normal. No murmurs, rubs, or gallops.  ABDOMEN: Soft, nontender, nondistended. Bowel sounds present. No organomegaly or mass.  EXTREMITIES: No pedal edema, cyanosis, or clubbing.  NEUROLOGIC: Cranial nerves II through XII are intact. Muscle strength 4/5 in upper extremities 4/ 5 in lower extremities. Sensation intact. Gait not checked.  PSYCHIATRIC: The patient is alert and oriented x 3.  SKIN: pressure ulcer in her sacral area. Physical Exam LABORATORY PANEL:   CBC  Recent Labs Lab 07/17/16 0533  WBC 13.4*  HGB 9.0*  HCT 27.1*  PLT 135*   ------------------------------------------------------------------------------------------------------------------  Chemistries   Recent Labs Lab 07/15/16 1139  07/17/16 0533  NA 130*  < > 129*  K 3.6  < > 3.6  CL 87*  < > 87*  CO2 34*  < > 33*  GLUCOSE 126*  < > 397*  BUN 36*  < > 32*  CREATININE 1.18*  < > 1.21*  CALCIUM 8.9  < > 8.3*  AST 48*  --   --   ALT 31  --   --   ALKPHOS 126  --   --   BILITOT 2.7*  --   --   < > = values in this interval not displayed. ------------------------------------------------------------------------------------------------------------------  Cardiac Enzymes No results for input(s): TROPONINI in the last 168 hours. ------------------------------------------------------------------------------------------------------------------  RADIOLOGY:  Dg Chest 2 View  Result Date: 07/15/2016 CLINICAL DATA:  Shortness of breath. EXAM: CHEST  2 VIEW COMPARISON:  07/08/2016 and prior chest radiographs FINDINGS:  Cardiomegaly and pulmonary vascular congestion noted. Mild interstitial opacities are again identified. New left lower lobe airspace disease/consolidation noted with small left pleural effusion again noted. Mild right basilar opacity may represent atelectasis versus airspace disease. There is no evidence of pneumothorax. No acute bony  abnormalities are noted. IMPRESSION: New left lower lobe airspace disease/consolidation which may represent pneumonia. Small left pleural effusion and mild right basilar atelectasis versus airspace disease. Cardiomegaly and pulmonary vascular congestion. Unchanged mild interstitial opacities. Electronically Signed   By: Harmon Pier M.D.   On: 07/15/2016 13:14    ASSESSMENT AND PLAN:   Principal Problem:   HCAP (healthcare-associated pneumonia) Active Problems:   Acute on chronic respiratory failure with hypoxia (HCC)   Unstageable pressure ulcer of sacral region (HCC)  * Ac on ch respi failure   HCAP   COPD exacerbation    IV vanc + cefepime, checked MRSA screen- stop vanc.   Try to get sputum culture   Solumedrol- change to prednisone, Duoneb, spiriva   On home o2 at 3 ltr/min, try to taper up to that level.  * lactic acidosis   Due to above, monitor. Improved.  * Hypertension    Hold meds as BP is running low normal.  * DM- uncontrolled   Due to steroids   Added long and short acting insulin.  * Anemia- likely GI blood loss.    Chronic drop in last 3-4 months     1 unit PRBC transfusion 07/16/16    Stool for guiac awaited.   Hb stable.    All the records are reviewed and case discussed with Care Management/Social Workerr. Management plans discussed with the patient, family and they are in agreement.  CODE STATUS: Full.  TOTAL TIME TAKING CARE OF THIS PATIENT: 35 minutes.     POSSIBLE D/C IN 1-2 DAYS, DEPENDING ON CLINICAL CONDITION.   Altamese Dilling M.D on 07/17/2016   Between 7am to 6pm - Pager - [phone]  After 6pm go to [email or web address] - password [email or web address]  [facility]  Office  [phone]  CC: Primary care physician; Allegra Grana, FNP  Note: This dictation was prepared with Dragon dictation along with smaller phrase technology. Any transcriptional errors that result from this process are unintentional.

## 2016-07-17 NOTE — Consult Note (Signed)
WOC Nurse wound consult note Reason for Consult: Intertriginous dermatitis (ITD)  in the subpannicular skin fold. Likely with fungal overgrowth. Moisture associated skin damage (IAD and ITD) in the intragluteal cleft and to bilateral buttocks, deep maroon area of traumatic skin loss on the left buttock Wound type: Moisture associated skin damage, specifically intertriginous dermatitis and incontinence associated dermatitis with trauma to left buttocks (shear), resulting in a deep tissue pressure injury Pressure Injury POA: Yes Measurement: Affected area on bilateral buttocks measures 12cm x 15cm.  Peeling and erythema are present, erythema blanches. Left buttock with 4cm x 3cm deep red/maroon area of discoloration (patient states is related to silicone dressing removal). Sub pannicular skin fold with erythema and satellite lesions Wound bed: As described above Drainage (amount, consistency, odor) Scant serous from buttocks. Bedside RN reports serosanguinous exudate on underpad earlier today Periwound:Intact, dry with areas on the upper extremities with ecchymosis and category 2 skin tears. Dressing procedure/placement/frequency: I will today provide patient with a mattress replacement with low air loss feature to decrease friction and shear while patient has HOB at or above a 30 degree angle for improved work of breathing.  Patient teaching is provided to patient and bedside RN regarding positioning: she is to turn from side to side and avoid the supine position except for meals (and bedpan use). She indicates understanding.  A prescriptive buttocks cream is provided for use 3x daily (1:1:1 skin barrier, hydrocortisone and antifungal). House antimicrobial textile (InterDry Ag+) is provided for moisture wicking and control of fungal overgrowth in the intertriginous areas. A one-time dose of systemic antifungal (eg., Diflucan) may expedite resolution of the fungal overgrowth.  If you agree, please order. WOC  nursing team will not follow, but will remain available to this patient, the nursing and medical teams.  Please re-consult if needed. Thanks, Ladona Mow, MSN, RN, GNP, [place]  Pager# [phone]

## 2016-07-18 LAB — TYPE AND SCREEN
ABO/RH(D): O POS
Antibody Screen: NEGATIVE
UNIT DIVISION: 0

## 2016-07-18 LAB — BASIC METABOLIC PANEL
ANION GAP: 6 (ref 5–15)
BUN: 33 mg/dL — ABNORMAL HIGH (ref 6–20)
CHLORIDE: 90 mmol/L — AB (ref 101–111)
CO2: 34 mmol/L — AB (ref 22–32)
CREATININE: 0.96 mg/dL (ref 0.44–1.00)
Calcium: 8.6 mg/dL — ABNORMAL LOW (ref 8.9–10.3)
GFR calc non Af Amer: 59 mL/min — ABNORMAL LOW (ref >60)
GLUCOSE: 186 mg/dL — AB (ref 65–99)
Potassium: 4.6 mmol/L (ref 3.5–5.1)
Sodium: 130 mmol/L — ABNORMAL LOW (ref 135–145)

## 2016-07-18 LAB — OCCULT BLOOD X 1 CARD TO LAB, STOOL: FECAL OCCULT BLD: NEGATIVE

## 2016-07-18 LAB — GLUCOSE, CAPILLARY
GLUCOSE-CAPILLARY: 148 mg/dL — AB (ref 65–99)
GLUCOSE-CAPILLARY: 216 mg/dL — AB (ref 65–99)
GLUCOSE-CAPILLARY: 219 mg/dL — AB (ref 65–99)
Glucose-Capillary: 187 mg/dL — ABNORMAL HIGH (ref 65–99)
Glucose-Capillary: 269 mg/dL — ABNORMAL HIGH (ref 65–99)

## 2016-07-18 LAB — CBC
HEMATOCRIT: 27.2 % — AB (ref 35.0–47.0)
HEMOGLOBIN: 9.1 g/dL — AB (ref 12.0–16.0)
MCH: 32.3 pg (ref 26.0–34.0)
MCHC: 33.3 g/dL (ref 32.0–36.0)
MCV: 96.8 fL (ref 80.0–100.0)
Platelets: 132 10*3/uL — ABNORMAL LOW (ref 150–440)
RBC: 2.81 MIL/uL — ABNORMAL LOW (ref 3.80–5.20)
RDW: 18.6 % — ABNORMAL HIGH (ref 11.5–14.5)
WBC: 15.9 10*3/uL — ABNORMAL HIGH (ref 3.6–11.0)

## 2016-07-18 LAB — BPAM RBC
BLOOD PRODUCT EXPIRATION DATE: 201806132359
ISSUE DATE / TIME: 201805191332
UNIT TYPE AND RH: 5100

## 2016-07-18 MED ORDER — LACTULOSE 10 GM/15ML PO SOLN
30.0000 g | Freq: Every day | ORAL | Status: DC | PRN
  Administered 2016-07-18: 11:00:00 30 g via ORAL
  Filled 2016-07-18: qty 60

## 2016-07-18 MED ORDER — LEVOFLOXACIN 750 MG PO TABS
750.0000 mg | ORAL_TABLET | Freq: Every day | ORAL | Status: DC
  Administered 2016-07-18 – 2016-07-19 (×2): 750 mg via ORAL
  Filled 2016-07-18 (×2): qty 1

## 2016-07-18 MED ORDER — CEFEPIME HCL 2 G IJ SOLR
2.0000 g | Freq: Three times a day (TID) | INTRAMUSCULAR | Status: DC
  Filled 2016-07-18 (×2): qty 2

## 2016-07-18 NOTE — Progress Notes (Signed)
[facility]  Patient Status: Active ([facility])  [facility] is providing the following services: SN/PT  If patient discharges after hours, please call [phone].   [name] 07/18/2016, 9:58 AM

## 2016-07-18 NOTE — Progress Notes (Signed)
Patient c/o pain at IV site.  Made Dr. Cherlynn Kaiser aware and he will change antibiotics changed to PO.  Dr. Hipolito Bayley ok with patient not having IV at this time.

## 2016-07-18 NOTE — Care Management Important Message (Signed)
Important Message  Patient Details  Name: [name] MRN: [id number] Date of Birth: January 03, 1946   Medicare Important Message Given:  Yes    Gwenette Greet, RN 07/18/2016, 8:21 AM

## 2016-07-18 NOTE — Progress Notes (Signed)
Pharmacy Antibiotic Note  [name] is a 71 y.o. female admitted on 07/15/2016 with HCAP.  Pharmacy has been consulted for cefepime dosing.  This is day #4 cefepime.  Plan: Increase dose to cefepime 2 g IV q8h based on improvement in renal function.  Height: 5\' 2"  (157.5 cm) Weight: 238 lb 11.2 oz (108.3 kg) IBW/kg (Calculated) : 50.1  Temp (24hrs), Avg:97.6 F (36.4 C), Min:97.4 F (36.3 C), Max:97.9 F (36.6 C)   Recent Labs Lab 07/15/16 1139 07/15/16 1451 07/16/16 0526 07/17/16 0533 07/18/16 0419  WBC 18.0*  --  9.4 13.4* 15.9*  CREATININE 1.18*  --  1.00 1.21* 0.96  LATICACIDVEN 2.3* 1.6  --   --   --     Estimated Creatinine Clearance: 63.2 mL/min (by C-G formula based on SCr of 0.96 mg/dL).    Allergies  Allergen Reactions  . Codeine Other (See Comments)    Reaction:  Dizziness   . Liraglutide     Other reaction(s): nausea and stomach pain    Antimicrobials this admission: Cefepime 5/18 >>  Vancomycin 5/18 >> 5/19  Dose adjustments this admission: 5/21 cefepime 2 g IV q12h --> cefepime 2 g IV q8h  Microbiology results: 5/18 BCx: No growth 3 days 5/18 UCx: 90K yeast 5/18 Sputum: Sent  5/19 MRSA PCR: negative  Thank you for allowing pharmacy to be a part of this patient's care.  6/19, PharmD Clinical Pharmacist 07/18/2016 8:54 AM

## 2016-07-18 NOTE — Telephone Encounter (Signed)
noted 

## 2016-07-18 NOTE — Progress Notes (Signed)
[facility] - [place] at [facility]   PATIENT NAME: [name]    MR#:  [id number]  DATE OF BIRTH:  [birth year]  SUBJECTIVE:   Pt. Here due to shortness of breath And noted to have multifocal pneumonia. Patient also noted to be anemic and has been transfused hemoglobin improved. Hemoccult is negative. Still feels short of breath and weak.  REVIEW OF SYSTEMS:    Review of Systems  Constitutional: Negative for chills and fever.  HENT: Negative for congestion and tinnitus.   Eyes: Negative for blurred vision and double vision.  Respiratory: Positive for cough and shortness of breath. Negative for wheezing.   Cardiovascular: Negative for chest pain, orthopnea and PND.  Gastrointestinal: Negative for abdominal pain, diarrhea, nausea and vomiting.  Genitourinary: Negative for dysuria and hematuria.  Neurological: Positive for weakness (generalized). Negative for dizziness, sensory change and focal weakness.  All other systems reviewed and are negative.   Nutrition: Heart Healthy/Carb modified.  Tolerating Diet: Yes Tolerating PT: Await Eval.   DRUG ALLERGIES:   Allergies  Allergen Reactions  . Codeine Other (See Comments)    Reaction:  Dizziness   . Liraglutide     Other reaction(s): nausea and stomach pain    VITALS:  Blood pressure (!) 119/59, pulse 78, temperature 97.8 F (36.6 C), temperature source Oral, resp. rate 16, height 5\' 2"  (1.575 m), weight 108.3 kg (238 lb 11.2 oz), SpO2 97 %.  PHYSICAL EXAMINATION:   Physical Exam  GENERAL:  71 year-old obese patient lying in bed in no acute distress.  EYES: Pupils equal, round, reactive to light and accommodation. No scleral icterus. Extraocular muscles intact.  HEENT: Head atraumatic, normocephalic. Oropharynx and nasopharynx clear.  NECK:  Supple, no jugular venous distention. No thyroid enlargement, no tenderness.  LUNGS: Poor REsp. Effort, no wheezing, rales, rhonchi. No use of accessory muscles  of respiration.  CARDIOVASCULAR: S1, S2 normal.  II/VI SEM at RSB, No rubs, or gallops.  ABDOMEN: Soft, nontender, nondistended. Bowel sounds present. No organomegaly or mass.  EXTREMITIES: No cyanosis, clubbing or edema b/l.    NEUROLOGIC: Cranial nerves II through XII are intact. No focal Motor or sensory deficits b/l.  Globally weak.  PSYCHIATRIC: The patient is alert and oriented x 3. SKIN: No obvious rash, lesion, or ulcer.    LABORATORY PANEL:   CBC  Recent Labs Lab 07/18/16 0419  WBC 15.9*  HGB 9.1*  HCT 27.2*  PLT 132*   ------------------------------------------------------------------------------------------------------------------  Chemistries   Recent Labs Lab 07/15/16 1139  07/18/16 0419  NA 130*  < > 130*  K 3.6  < > 4.6  CL 87*  < > 90*  CO2 34*  < > 34*  GLUCOSE 126*  < > 186*  BUN 36*  < > 33*  CREATININE 1.18*  < > 0.96  CALCIUM 8.9  < > 8.6*  AST 48*  --   --   ALT 31  --   --   ALKPHOS 126  --   --   BILITOT 2.7*  --   --   < > = values in this interval not displayed. ------------------------------------------------------------------------------------------------------------------  Cardiac Enzymes No results for input(s): TROPONINI in the last 168 hours. ------------------------------------------------------------------------------------------------------------------  RADIOLOGY:  No results found.   ASSESSMENT AND PLAN:   71 year old female with past medical history of restless leg syndrome, peripheral vascular disease, hypertension, GERD, diabetes, CHF, history of previous CVA who presented to the hospital due to shortness of breath and noted to  have pneumonia.  1. Acute on chronic respiratory failure with hypoxia-secondary to COPD exacerbation and pneumonia. -Continue O2 supplementation, was on IV steroids for COPD but now weaned to oral prednisone. -Also was on IV cefepime for pneumonia but not now are down to oral Levaquin. -Patient  already on home oxygen.  2. Pneumonia-MRSA PCR negative. Was on vancomycin, cefepime. -Now on oral Levaquin, clinically improved since admission. Afebrile and hemodynamically stable.  3. COPD exacerbation-secondary to pneumonia. Much improved since admission. -Off IV steroids now on oral prednisone, continue duo nebs, Pulmicort nebs, Levaquin for pneumonia.  4. Anemia -  Acute on chronic anemia. Source unclear and suspected to be GI loss. -Hemoccult although negative. Transfused 1 unit of packed red blood cells and hemoglobin currently stable. -We'll refer to gastroenterology as an outpatient. -Continue iron supplements.  5. DM Neuropathy-continue gabapentin.  6. DM type II Insulin dependent - cont. Lantus, Novolog with meals, SSI coverage.  - follow BS.   7. Hyperlipidemia - cont. Pravachol.   All the records are reviewed and case discussed with Care Management/Social Worker. Management plans discussed with the patient, family and they are in agreement.  CODE STATUS: Full code  DVT Prophylaxis: Hep. SQ  TOTAL TIME TAKING CARE OF THIS PATIENT: 30 minutes.   POSSIBLE D/C IN 1-2 DAYS, DEPENDING ON CLINICAL CONDITION.   Houston Siren M.D on 07/18/2016 at 2:59 PM  Between 7am to 6pm - Pager - [phone]  After 6pm go to [email or web address] - Scientist, research (life sciences) [facility]  Office  [phone]  CC: Primary care physician; Allegra Grana, FNP

## 2016-07-19 ENCOUNTER — Inpatient Hospital Stay: Payer: Medicare Other

## 2016-07-19 ENCOUNTER — Telehealth: Payer: Self-pay | Admitting: Family

## 2016-07-19 DIAGNOSIS — I5032 Chronic diastolic (congestive) heart failure: Secondary | ICD-10-CM

## 2016-07-19 LAB — BLOOD GAS, VENOUS
Acid-Base Excess: 14.2 mmol/L — ABNORMAL HIGH (ref 0.0–2.0)
BICARBONATE: 39.8 mmol/L — AB (ref 20.0–28.0)
FIO2: 100
Patient temperature: 37
pCO2, Ven: 56 mmHg (ref 44.0–60.0)
pH, Ven: 7.46 — ABNORMAL HIGH (ref 7.250–7.430)

## 2016-07-19 LAB — BASIC METABOLIC PANEL
Anion gap: 5 (ref 5–15)
BUN: 21 mg/dL — AB (ref 6–20)
CHLORIDE: 89 mmol/L — AB (ref 101–111)
CO2: 35 mmol/L — ABNORMAL HIGH (ref 22–32)
CREATININE: 0.74 mg/dL (ref 0.44–1.00)
Calcium: 9 mg/dL (ref 8.9–10.3)
Glucose, Bld: 149 mg/dL — ABNORMAL HIGH (ref 65–99)
POTASSIUM: 4.7 mmol/L (ref 3.5–5.1)
SODIUM: 129 mmol/L — AB (ref 135–145)

## 2016-07-19 LAB — CBC
HCT: 29.1 % — ABNORMAL LOW (ref 35.0–47.0)
HEMOGLOBIN: 9.4 g/dL — AB (ref 12.0–16.0)
MCH: 31.9 pg (ref 26.0–34.0)
MCHC: 32.3 g/dL (ref 32.0–36.0)
MCV: 98.6 fL (ref 80.0–100.0)
Platelets: 139 10*3/uL — ABNORMAL LOW (ref 150–440)
RBC: 2.95 MIL/uL — ABNORMAL LOW (ref 3.80–5.20)
RDW: 18.5 % — ABNORMAL HIGH (ref 11.5–14.5)
WBC: 15.2 10*3/uL — ABNORMAL HIGH (ref 3.6–11.0)

## 2016-07-19 LAB — GLUCOSE, CAPILLARY
GLUCOSE-CAPILLARY: 120 mg/dL — AB (ref 65–99)
Glucose-Capillary: 166 mg/dL — ABNORMAL HIGH (ref 65–99)

## 2016-07-19 LAB — PROCALCITONIN: Procalcitonin: 0.21 ng/mL

## 2016-07-19 MED ORDER — LEVOFLOXACIN 750 MG PO TABS: 750.0000 mg | ORAL_TABLET | Freq: Every day | ORAL | 0 refills | Status: AC

## 2016-07-19 MED ORDER — TORSEMIDE 20 MG PO TABS: 40.0000 mg | ORAL_TABLET | Freq: Every day | ORAL | 3 refills | Status: AC

## 2016-07-19 MED ORDER — GUAIFENESIN ER 600 MG PO TB12: 600.0000 mg | ORAL_TABLET | Freq: Two times a day (BID) | ORAL | 0 refills | Status: AC

## 2016-07-19 MED ORDER — PREDNISONE 10 MG PO TABS: ORAL_TABLET | ORAL | 0 refills | Status: AC

## 2016-07-19 NOTE — Progress Notes (Signed)
Physical Therapy Treatment Patient Details Name: [name] MRN: [id number] DOB: 1945-12-25 Today's Date: 07/19/2016    History of Present Illness [name]  is a 71 y.o. female with a known history of CHF, DM, Htn, Asthma, PVD, CVA, bedbound for last few months and have 24x7 care taker.    PT Comments    Pt ready for session.  Min assist with with bed mobility.  Pt was able to stand pivot to to wheel chair at bedside then to recliner with supervision.  OVerall transferred well with no knee buckling.  Participated in exercises as described below.   Follow Up Recommendations  Home health PT     Equipment Recommendations  None recommended by PT    Recommendations for Other Services       Precautions / Restrictions Precautions Precautions: Fall Restrictions Weight Bearing Restrictions: No    Mobility  Bed Mobility Overal bed mobility: Needs Assistance Bed Mobility: Supine to Sit     Supine to sit: Min assist        Transfers     Transfers: Stand Pivot Transfers Sit to Stand: Supervision Stand pivot transfers: Supervision          Ambulation/Gait             General Gait Details: no tested   Stairs            Wheelchair Mobility    Modified Rankin (Stroke Patients Only)       Balance Overall balance assessment: Needs assistance Sitting-balance support: Feet supported Sitting balance-Leahy Scale: Good     Standing balance support: Bilateral upper extremity supported Standing balance-Leahy Scale: Fair                              Cognition Arousal/Alertness: Awake/alert Behavior During Therapy: WFL for tasks assessed/performed Overall Cognitive Status: Within Functional Limits for tasks assessed                                        Exercises Other Exercises Other Exercises: supne ank le pumps, heel slides, SLR x 10    General Comments        Pertinent Vitals/Pain Pain Assessment:  0-10 Pain Score: 7  Pain Location: buttocks Pain Descriptors / Indicators: Sore;Aching Pain Intervention(s): Limited activity within patient's tolerance    Home Living                      Prior Function            PT Goals (current goals can now be found in the care plan section) Progress towards PT goals: Progressing toward goals    Frequency    Min 2X/week      PT Plan Current plan remains appropriate    Co-evaluation              AM-PAC PT "6 Clicks" Daily Activity  Outcome Measure  Difficulty turning over in bed (including adjusting bedclothes, sheets and blankets)?: A Little Difficulty moving from lying on back to sitting on the side of the bed? : A Little Difficulty sitting down on and standing up from a chair with arms (e.g., wheelchair, bedside commode, etc,.)?: A Little Help needed moving to and from a bed to chair (including a wheelchair)?: A Little Help needed walking in hospital room?: Total Help  needed climbing 3-5 steps with a railing? : Total 6 Click Score: 14    End of Session Equipment Utilized During Treatment: Oxygen Activity Tolerance: Patient tolerated treatment well Patient left: in chair;with chair alarm set;with call bell/phone within reach;with nursing/sitter in room Nurse Communication: Mobility status       Time: 1448-1856 PT Time Calculation (min) (ACUTE ONLY): 15 min  Charges:  $Therapeutic Activity: 8-22 mins                    G Codes:

## 2016-07-19 NOTE — Clinical Social Work Note (Signed)
CSW consulted per pt's son request. CSW met with pt's son to address consult as pt was off the unit at a test. Pt's son inquired about a higher level of care in the home per the recommendation of Palliative Care following. Pt lives at home with 24/7 caregivers and home health services through [facility], also with Palliative Care services through [facility] and [facility]. Pt's son shared that pt would like Hospice services to maintain a comfortable quality of life. CSW address placement options, which were declined. CSW provided information for Medicaid. CSW referred pt to [facility] to follow for discharge planning needs. CSW is signing off as no further needs identified.   Darden Dates, MSW, LCSW Clinical Social Worker  [phone]

## 2016-07-19 NOTE — Care Management (Signed)
Admitted to this facility with the diagnosis of pneumonia. Lives alone. Son is Ronaldo Miyamoto [phone]). Daughter Marcelino Duster is POA. Last seen Arnett FNP 07/07/16. Currently followed by [facility] for skilled nursing and physical therapy in the home. Palliative Care is in the home too. Chronic home oxygen 07/05/16. Nebulizer,  Wheelchair, hospital bed, rolling walker  in the home. [employer] and [employer] in the past. 24/7 paid care givers in the home. (retired Therapist, music). Unable to attend CHF clinic due to transportation issues. Prescriptions are filled at [facility] in [place]. Family will transport.  Son Ronaldo Miyamoto and Ms. Crumby are ready to transition to Hospice Services in the home. Dayna Barker, RN representative for [facility] updated. Discharge to home 07/20/16 per Dr. Cherlynn Kaiser.  Gwenette Greet RN MSN CCM Care Management [phone]

## 2016-07-19 NOTE — Discharge Summary (Signed)
[place] [facility] NAME: [name]    MR#:  [id number]  DATE OF BIRTH:  [birth year]  DATE OF ADMISSION:  07/15/2016 ADMITTING PHYSICIAN: [name], MD  DATE OF DISCHARGE: 07/19/2016  PRIMARY CARE PHYSICIAN: Burnard Hawthorne, FNP    ADMISSION DIAGNOSIS:  COPD exacerbation ([place]) [J44.1] Sepsis, due to unspecified organism [facility]) [A41.9] Acute renal failure, unspecified acute renal failure type ([place]) [N17.9]  DISCHARGE DIAGNOSIS:  Principal Problem:   HCAP (healthcare-associated pneumonia) Active Problems:   Acute on chronic respiratory failure with hypoxia ([place])   Unstageable pressure ulcer of sacral region [facility])   SECONDARY DIAGNOSIS:   Past Medical History:  Diagnosis Date  . Anemia   . Asthma   . CHF (congestive heart failure) ([place])   . CVA (cerebral vascular accident) ([place])   . Diabetes mellitus without complication ([place])   . GERD (gastroesophageal reflux disease)   . Heart murmur   . History of hiatal hernia   . Hypertension   . Panic attacks   . Peripheral vascular disease ([place])   . Restless leg syndrome   . Shortness of breath dyspnea     HOSPITAL COURSE:   71 year old female with past medical history of restless leg syndrome, peripheral vascular disease, hypertension, GERD, diabetes, CHF, history of previous CVA who presented to the hospital due to shortness of breath and noted to have pneumonia.  1. Acute on chronic respiratory failure with hypoxia-secondary to COPD exacerbation and pneumonia. -Initially treated with IV steroids, DuoNeb's, IV cefepime and has improved. She is already on oxygen at home which she will continue. She is being discharged home on oral prednisone taper, Levaquin, maintenance inhalers and will continue follow-up with her primary care physician as outpatient.  2. Pneumonia-initially treated with IV vancomycin, cefepime but MRSA PCR was negative and vancomycin discontinued.  Now now is down from cefepime to oral Levaquin and being discharged home. She has remained afebrile and hemodynamically stable over the past 48 hours.  3. COPD exacerbation-secondary to pneumonia. Much improved since admission. -Off IV steroids now on oral prednisone which she will cont. To taper - she will cont. Her albuterol nebs, Spiriva. Cont. Chronic O2 at home.   4. Anemia -  Acute on chronic anemia. Source unclear and no evidence of GI bleed as Hemoccult was (-).  -Transfused 1 unit of packed red blood cells and hemoglobin currently stable -Continue iron supplements.  5. DM Neuropathy- she will continue gabapentin.  6. DM type II Insulin dependent - she will cont. Lantus, Novolog with meals  7. Hyperlipidemia - she will cont. Pravachol.   8. History of rheumatoid arthritis-patient will continue methotrexate.  9. Essential hypertension-patient will continue her ramipril.  DISCHARGE CONDITIONS:   Stable  CONSULTS OBTAINED:    DRUG ALLERGIES:   Allergies  Allergen Reactions  . Codeine Other (See Comments)    Reaction:  Dizziness   . Liraglutide     Other reaction(s): nausea and stomach pain    DISCHARGE MEDICATIONS:   Allergies as of 07/19/2016      Reactions   Codeine Other (See Comments)   Reaction:  Dizziness    Liraglutide    Other reaction(s): nausea and stomach pain      Medication List    STOP taking these medications   hydrochlorothiazide 12.5 MG tablet Commonly known as:  HYDRODIURIL     TAKE these medications   acetaminophen 325 MG tablet Commonly known as:  TYLENOL Take 2  tablets (650 mg total) by mouth every 6 (six) hours as needed for mild pain (or Fever >/= 101).   albuterol (2.5 MG/3ML) 0.083% nebulizer solution Commonly known as:  PROVENTIL Take 3 mLs (2.5 mg total) by nebulization every 4 (four) hours as needed for wheezing.   aspirin EC 81 MG tablet Take 81 mg by mouth daily.   B-12 1000 MCG/ML Kit Inject 1,000 mcg as directed  every 30 (thirty) days.   benzonatate 100 MG capsule Commonly known as:  TESSALON Take 1 capsule (100 mg total) by mouth 3 (three) times daily as needed for cough.   Cholecalciferol 1000 units capsule Take 1,000 Units by mouth daily.   dapsone 100 MG tablet Take 150 mg by mouth daily.   feeding supplement (PRO-STAT SUGAR FREE 64) Liqd Take 30 mLs by mouth daily.   ferrous sulfate 325 (65 FE) MG tablet Take 1 tablet (325 mg total) by mouth daily with breakfast.   folic acid 1 MG tablet Commonly known as:  FOLVITE Take 1 mg by mouth daily. Take one pill daily on Tuesday, Wednesday, Thursday, Friday, Saturday and Sunday. These are days were patient isn't taking methotrexate   gabapentin 800 MG tablet Commonly known as:  NEURONTIN Take 800 mg by mouth 2 (two) times daily.   guaiFENesin 600 MG 12 hr tablet Commonly known as:  MUCINEX Take 1 tablet (600 mg total) by mouth 2 (two) times daily.   insulin aspart 100 UNIT/ML injection Commonly known as:  novoLOG Inject 10 Units into the skin 3 (three) times daily before meals. 301-350+ 10 units, 351 or higher NOTIFY MD   insulin glargine 100 UNIT/ML injection Commonly known as:  LANTUS Inject 0.2 mLs (20 Units total) into the skin at bedtime.   levofloxacin 750 MG tablet Commonly known as:  LEVAQUIN Take 1 tablet (750 mg total) by mouth daily. Start taking on:  07/20/2016   lovastatin 20 MG tablet Commonly known as:  MEVACOR Take 20 mg by mouth at bedtime.   magnesium oxide 400 (241.3 Mg) MG tablet Commonly known as:  MAG-OX Take 1 tablet (400 mg total) by mouth 2 (two) times daily.   methotrexate 2.5 MG tablet Commonly known as:  RHEUMATREX Take 10 mg by mouth once a week. Patient takes on Monday. Caution:Chemotherapy. Protect from light.   MULTIVITAL tablet Take 1 tablet by mouth daily.   omeprazole 20 MG capsule Commonly known as:  PRILOSEC Take 1 capsule (20 mg total) by mouth at bedtime.   potassium chloride SA  20 MEQ tablet Commonly known as:  K-DUR,KLOR-CON Take 2 tablets (40 mEq total) by mouth 2 (two) times daily.   predniSONE 10 MG tablet Commonly known as:  DELTASONE Label  & dispense according to the schedule below. 5 Pills PO for 1 day then, 4 Pills PO for 1 day, 3 Pills PO for 1 day, 2 Pills PO for 1 day, 1 Pill PO for 1 days then STOP. What changed:  how much to take  how to take this  when to take this  additional instructions   ramipril 2.5 MG capsule Commonly known as:  ALTACE Take 1 capsule (2.5 mg total) by mouth daily.   SILVASORB Gel Apply 1 application topically 2 (two) times daily as needed.   tiotropium 18 MCG inhalation capsule Commonly known as:  SPIRIVA Place 1 capsule (18 mcg total) into inhaler and inhale daily.   torsemide 20 MG tablet Commonly known as:  DEMADEX Take 2 tablets (40 mg  total) by mouth daily. What changed:  when to take this         DISCHARGE INSTRUCTIONS:   DIET:  Cardiac diet and Diabetic diet  DISCHARGE CONDITION:  Stable  ACTIVITY:  Activity as tolerated  OXYGEN:  Home Oxygen: Yes.     Oxygen Delivery: 2 liters/min via Patient connected to nasal cannula oxygen  DISCHARGE LOCATION:  Home with Hospice.    If you experience worsening of your admission symptoms, develop shortness of breath, life threatening emergency, suicidal or homicidal thoughts you must seek medical attention immediately by calling 911 or calling your MD immediately  if symptoms less severe.  You Must read complete instructions/literature along with all the possible adverse reactions/side effects for all the Medicines you take and that have been prescribed to you. Take any new Medicines after you have completely understood and accpet all the possible adverse reactions/side effects.   Please note  You were cared for by a hospitalist during your hospital stay. If you have any questions about your discharge medications or the care you received while you  were in the hospital after you are discharged, you can call the unit and asked to speak with the hospitalist on call if the hospitalist that took care of you is not available. Once you are discharged, your primary care physician will handle any further medical issues. Please note that NO REFILLS for any discharge medications will be authorized once you are discharged, as it is imperative that you return to your primary care physician (or establish a relationship with a primary care physician if you do not have one) for your aftercare needs so that they can reassess your need for medications and monitor your lab values.     Today   Shortness of breath, cough improved since admission. No chest pain no other acute complaints or events overnight.  VITAL SIGNS:  Blood pressure (!) 104/54, pulse 94, temperature 98.1 F (36.7 C), temperature source Oral, resp. rate 20, height 5' 2" (1.575 m), weight 108.3 kg (238 lb 11.2 oz), SpO2 95 %.  I/O:   Intake/Output Summary (Last 24 hours) at 07/19/16 1458 Last data filed at 07/19/16 1334  Gross per 24 hour  Intake              840 ml  Output                0 ml  Net              840 ml    PHYSICAL EXAMINATION:   GENERAL:  70 y.o.-year-old obese patient lying in bed in no acute distress.  EYES: Pupils equal, round, reactive to light and accommodation. No scleral icterus. Extraocular muscles intact.  HEENT: Head atraumatic, normocephalic. Oropharynx and nasopharynx clear.  NECK:  Supple, no jugular venous distention. No thyroid enlargement, no tenderness.  LUNGS: Poor REsp. Effort, no wheezing, rales, rhonchi. No use of accessory muscles of respiration.  CARDIOVASCULAR: S1, S2 normal.  II/VI SEM at RSB, No rubs, or gallops.  ABDOMEN: Soft, nontender, nondistended. Bowel sounds present. No organomegaly or mass.  EXTREMITIES: No cyanosis, clubbing or edema b/l.    NEUROLOGIC: Cranial nerves II through XII are intact. No focal Motor or sensory deficits  b/l.  Globally weak.  PSYCHIATRIC: The patient is alert and oriented x 3. SKIN: No obvious rash, lesion, or ulcer.   DATA REVIEW:   CBC  Recent Labs Lab 07/19/16 0448  WBC 15.2*  HGB 9.4*  HCT 29.1*  PLT 139*    Chemistries   Recent Labs Lab 07/15/16 1139  07/19/16 0448  NA 130*  < > 129*  K 3.6  < > 4.7  CL 87*  < > 89*  CO2 34*  < > 35*  GLUCOSE 126*  < > 149*  BUN 36*  < > 21*  CREATININE 1.18*  < > 0.74  CALCIUM 8.9  < > 9.0  AST 48*  --   --   ALT 31  --   --   ALKPHOS 126  --   --   BILITOT 2.7*  --   --   < > = values in this interval not displayed.  Cardiac Enzymes No results for input(s): TROPONINI in the last 168 hours.  Microbiology Results  Results for orders placed or performed during the hospital encounter of 07/15/16  Culture, blood (routine x 2)     Status: None (Preliminary result)   Collection Time: 07/15/16 11:39 AM  Result Value Ref Range Status   Specimen Description BLOOD LEFT ASSIST CONTROL  Final   Special Requests   Final    BOTTLES DRAWN AEROBIC AND ANAEROBIC Blood Culture adequate volume   Culture NO GROWTH 4 DAYS  Final   Report Status PENDING  Incomplete  Culture, blood (routine x 2)     Status: None (Preliminary result)   Collection Time: 07/15/16 11:53 AM  Result Value Ref Range Status   Specimen Description BLOOD RIGHT ARM  Final   Special Requests   Final    BOTTLES DRAWN AEROBIC AND ANAEROBIC Blood Culture results may not be optimal due to an inadequate volume of blood received in culture bottles   Culture NO GROWTH 4 DAYS  Final   Report Status PENDING  Incomplete  Urine culture     Status: Abnormal   Collection Time: 07/15/16  4:19 PM  Result Value Ref Range Status   Specimen Description URINE, CLEAN CATCH  Final   Special Requests NONE  Final   Culture 90,000 COLONIES/mL YEAST (A)  Final   Report Status 07/16/2016 FINAL  Final  MRSA PCR Screening     Status: None   Collection Time: 07/15/16  5:09 PM  Result Value  Ref Range Status   MRSA by PCR NEGATIVE NEGATIVE Final    Comment:        The GeneXpert MRSA Assay (FDA approved for NASAL specimens only), is one component of a comprehensive MRSA colonization surveillance program. It is not intended to diagnose MRSA infection nor to guide or monitor treatment for MRSA infections.     RADIOLOGY:  Dg Chest 2 View  Result Date: 07/19/2016 CLINICAL DATA:  A shortness of breath.  Cough. EXAM: CHEST  2 VIEW COMPARISON:  07/15/2016 FINDINGS: Bilateral mild interstitial thickening. Mild left lower lobe airspace disease. No pleural effusion or pneumothorax. Stable cardiomegaly. Thoracic aortic atherosclerosis. No acute osseous abnormality. IMPRESSION: 1. Cardiomegaly with mild pulmonary vascular congestion. 2. Left lower lobe airspace disease which may reflect atelectasis versus pneumonia. Followup PA and lateral chest X-ray is recommended in 3-4 weeks following trial of antibiotic therapy to ensure resolution and exclude underlying malignancy. Electronically Signed   By: Kathreen Devoid   On: 07/19/2016 10:25      Management plans discussed with the patient, family and they are in agreement.  CODE STATUS:     Code Status Orders        Start     Ordered   07/15/16 1640  Full code  Continuous     07/15/16 1639          TOTAL TIME TAKING CARE OF THIS PATIENT: 40 minutes.    Henreitta Leber M.D on 07/19/2016 at 2:58 PM  Between 7am to 6pm - Pager - [phone]  After 6pm go to [email or web address] - [employer] [place] [facility]  Office  [phone]  CC: Primary care physician; Burnard Hawthorne, FNP

## 2016-07-19 NOTE — Telephone Encounter (Signed)
Debra from Hospice called and stated that pt is being discharged from the hospital today and they are requesting Hospice care. Hospice would like to know if M. Jason Coop will be servicing and signing orders for pt's care. Please advise, thank you!  Call Debra @ [phone]

## 2016-07-19 NOTE — Progress Notes (Signed)
New referral for [facility] services at home received from [facility]. Patient is currently receiving home Palliative services. Patient is a 71 year old woman with a known history of CHF, DM II, HTN, Asthma, PVD, CVA. She has had repeated admissions, 5 in the past 6 months for respiratory issues. She lives alone and has 24 hour care. Patient and her family have chosen to focus on her comfort with the support of hospice services at home. At this time she remains a full code. Writer met in the room with patient and her son [name]. Patient was alert and oriented and participated in the discussion. Writer initiated education regarding hospice services, philosophy and team approach to care with good understanding voiced. No DME needs at this time. Patient has a hospital bed and oxygen in place at home. She will transport home via [place] with a portable oxygen tank per [name]. Hospital care team updated. Patient information faxed to referral. Thank you. Kendra Shanks RN, BSN, [facility] [place], hospital liaison [phone] c

## 2016-07-19 NOTE — Progress Notes (Signed)
New referral for [facility] services at home following discharge received from [facility]. Full note to follow. Thank you. Dayna Barker RN, BSN, [facility] [place], hospital Liaison [phone] c

## 2016-07-20 LAB — CULTURE, BLOOD (ROUTINE X 2)
Culture: NO GROWTH
Culture: NO GROWTH
Special Requests: ADEQUATE

## 2016-07-20 NOTE — Telephone Encounter (Signed)
Please advise, thanks.

## 2016-07-20 NOTE — Telephone Encounter (Signed)
Spoke with [name] to rely the message

## 2016-07-20 NOTE — Telephone Encounter (Signed)
Yes- orders for hospice have been sent.

## 2016-07-20 NOTE — Telephone Encounter (Signed)
Patients daughter requested to know if Rennie Plowman received the request for orders.  Rande Brunt [phone]

## 2016-07-21 NOTE — Telephone Encounter (Signed)
FYI Daughter requested to use the doctors provided by [facility] [place] [facility] [place]

## 2016-07-21 NOTE — Telephone Encounter (Signed)
FYI, thanks.

## 2016-07-21 NOTE — Telephone Encounter (Signed)
[name] [phone] called from [facility] stating that they still have not received the fax [phone]. Please advise? Thank you!

## 2016-07-21 NOTE — Telephone Encounter (Signed)
Did you fax this? Or is it is the fax information at the [facility] seat for Claris Che?

## 2016-07-21 NOTE — Telephone Encounter (Signed)
Please advise, thanks.

## 2016-07-21 NOTE — Telephone Encounter (Signed)
Orders complete As discussed, I will collaborate with heart failure clinic but will primary lead

## 2016-07-21 NOTE — Telephone Encounter (Signed)
[name] called back stating she just needs hospice order and cert of terminal illness. That order needs to be signed by pcp. She wants to know if the NP will be hospice attending or the pcp? Thank you!

## 2016-07-25 NOTE — Progress Notes (Signed)
Location:  [facility] Room Number: 1610R Place of Service:  SNF [phone])  Provider: Marlowe Sax FNP-C   PCP: Burnard Hawthorne, FNP Patient Care Team: Burnard Hawthorne, FNP as PCP - General (Family Medicine) Alisa Graff, FNP as Nurse Practitioner (Family Medicine) Yolonda Kida, MD as Consulting Physician (Cardiology)  Extended Emergency Contact Information Primary Emergency Contact: Eliezer Champagne of [place] Phone: [phone] Relation: Son Secondary Emergency Contact: Courtney Heys [place] of [place] Mobile Phone: [phone] Relation: Daughter  Code Status: Full code  Goals of care:  Advanced Directive information Advanced Directives 07/15/2016  Does Patient Have a Medical Advance Directive? No  Type of Advance Directive -  Does patient want to make changes to medical advance directive? No - Patient declined  Copy of Cash in Chart? -  Would patient like information on creating a medical advance directive? No - Patient declined     Allergies  Allergen Reactions  . Codeine Other (See Comments)    Reaction:  Dizziness   . Liraglutide     Other reaction(s): nausea and stomach pain    Chief Complaint  Patient presents with  . Discharge Note    Discharge home from [facility]     HPI:  71 y.o. female seen today at [facility] for discharge home. She was here for short term rehabilitation for post hospital admission from 05/30/2016-06/02/2016 with acute on chronic diastolic CHF exacerbation. She received I.V diuresis and was switched to po torsemide prior to discharge.She was seen by cardiology. She had low K and required repletion. She was also started on antibiotic for klebsiella UTI and has completed her antibiotics.She has a medical history of HTN, Chronic diastolic Congestive Heart Failure, CVA, PVD, Asthma, Hiatal hernia, RLS among  other conditions.she is seen in her room today. She denies any acute issues this visit.   Her rehab stay has been cut off due to her insurance coverage. She has worked well with PT/OT. She is not stable for discharge home from medical stand point.Her recent lab results showed WBC 16.3, Hgb 10.2, HCT 31.7, TP 5.7, ALB 3.73, BNP 113, Na 133 ( 06/21/2016). Portable CXR showed no acute abnormalities. Urine specimen collected to rule out UTI.she is due for CBC recheck but states does not want to wait for labs to be done.POA and patient states unable to pay out of pocket for rehab stay since insurance will no longer cover.She is a high risk for hospital readmission.   She will be discharged home with Home health PT/OT to continue with ROM, Exercise, Gait stability and muscle strengthening.She does not require any DME has own walker at home. Home health services will be arranged by facility social worker prior to discharge.She will discharge with her medication from the facility. Prescription medication will be written x 1 month then patient to follow up with PCP in 1-2 weeks. Facility staff report no new concerns.  Past Medical History:  Diagnosis Date  . Anemia   . Asthma   . CHF (congestive heart failure) ([place])   . CVA (cerebral vascular accident) ([place])   . Diabetes mellitus without complication ([place])   . GERD (gastroesophageal reflux disease)   . Heart murmur   . History of hiatal hernia   . Hypertension   . Panic attacks   . Peripheral vascular disease ([place])   . Restless leg syndrome   . Shortness of  breath dyspnea     Past Surgical History:  Procedure Laterality Date  . ABDOMINAL HYSTERECTOMY    . BREAST BIOPSY Right yrs ago   benign  . ENDARTERECTOMY Right 07/24/2014   Procedure: ENDARTERECTOMY CAROTID;  Surgeon: Algernon Huxley, MD;  Location: [facility] ORS;  Service: Vascular;  Laterality: Right;  . EYE SURGERY Left    cataract  . FRACTURE SURGERY Left    fractured ankle  . TONSILLECTOMY         reports that she has never smoked. She has never used smokeless tobacco. She reports that she does not drink alcohol or use drugs. Social History   Social History  . Marital status: Widowed    Spouse name: N/A  . Number of children: N/A  . Years of education: N/A   Occupational History  . Not on file.   Social History Main Topics  . Smoking status: Never Smoker  . Smokeless tobacco: Never Used  . Alcohol use No  . Drug use: No  . Sexual activity: No   Other Topics Concern  . Not on file   Social History Narrative   Admitted to [facility]  4/8/208   Retired- [employer]    2 children; boy and girl   3 grandchildren   Never smoked   Full Code       Allergies  Allergen Reactions  . Codeine Other (See Comments)    Reaction:  Dizziness   . Liraglutide     Other reaction(s): nausea and stomach pain    Pertinent  Health Maintenance Due  Topic Date Due  . FOOT EXAM  02/04/1956  . COLONOSCOPY  02/04/1996  . PNA vac Low Risk Adult (1 of 2 - PCV13) 02/04/2011  . OPHTHALMOLOGY EXAM  08/10/2016  . INFLUENZA VACCINE  09/28/2016  . HEMOGLOBIN A1C  11/29/2016  . MAMMOGRAM  05/27/2017  . DEXA SCAN  Completed    Medications: Allergies as of 06/21/2016      Reactions   Codeine Other (See Comments)   Reaction:  Dizziness    Liraglutide    Other reaction(s): nausea and stomach pain      Medication List       Accurate as of 06/21/16 11:59 PM. Always use your most recent med list.          acetaminophen 325 MG tablet Commonly known as:  TYLENOL Take 2 tablets (650 mg total) by mouth every 6 (six) hours as needed for mild pain (or Fever >/= 101).   albuterol (2.5 MG/3ML) 0.083% nebulizer solution Commonly known as:  PROVENTIL Take 3 mLs (2.5 mg total) by nebulization every 4 (four) hours as needed for wheezing.   aspirin EC 81 MG tablet Take 81 mg by mouth daily.   B-12 1000 MCG/ML Kit Inject 1,000 mcg as directed every 30 (thirty) days.   Cholecalciferol 1000  units capsule Take 1,000 Units by mouth daily.   dapsone 100 MG tablet Take 150 mg by mouth daily.   feeding supplement (PRO-STAT SUGAR FREE 64) Liqd Take 30 mLs by mouth. 30 ml daily for 30 days   ferrous sulfate 325 (65 FE) MG tablet Take 1 tablet (325 mg total) by mouth daily with breakfast.   folic acid 1 MG tablet Commonly known as:  FOLVITE Take 1 mg by mouth daily. Take one pill daily on Tuesday, Wednesday, Thursday, Friday, Saturday and Sunday. These are days were patient isn't taking methotrexate   gabapentin 800 MG tablet Commonly known as:  NEURONTIN Take 800 mg by mouth 2 (two) times daily.   hydrochlorothiazide 12.5 MG tablet Commonly known as:  HYDRODIURIL Take 12.5 mg by mouth 2 (two) times daily.   insulin aspart 100 UNIT/ML injection Commonly known as:  novoLOG Inject 10 Units into the skin 3 (three) times daily before meals. 301-350+ 10 units, 351 or higher NOTIFY MD   insulin aspart 100 UNIT/ML injection Commonly known as:  novoLOG Inject 6 Units into the skin 3 (three) times daily with meals.   insulin glargine 100 UNIT/ML injection Commonly known as:  LANTUS Inject 0.2 mLs (20 Units total) into the skin at bedtime.   lovastatin 20 MG tablet Commonly known as:  MEVACOR Take 20 mg by mouth at bedtime.   methotrexate 2.5 MG tablet Commonly known as:  RHEUMATREX Take 10 mg by mouth once a week. Patient takes on Monday. Caution:Chemotherapy. Protect from light.   MULTIVITAL tablet Take 1 tablet by mouth daily.   omeprazole 20 MG capsule Commonly known as:  PRILOSEC Take 1 capsule (20 mg total) by mouth at bedtime.   potassium chloride SA 20 MEQ tablet Commonly known as:  K-DUR,KLOR-CON Take 2 tablets (40 mEq total) by mouth 2 (two) times daily.   predniSONE 5 MG tablet Commonly known as:  DELTASONE Take 15 mg by mouth daily with breakfast. Stop date 06/21/16   predniSONE 5 MG tablet Commonly known as:  DELTASONE Take 15 mg by mouth daily  with breakfast. Start on 06/28/16. Stop date 07/04/16   predniSONE 10 MG tablet Commonly known as:  DELTASONE Take 10 mg by mouth daily with breakfast. Until seen by dermatologist   ramipril 2.5 MG capsule Commonly known as:  ALTACE Take 1 capsule (2.5 mg total) by mouth daily.   SILVASORB Gel Apply 1 application topically 2 (two) times daily as needed.   torsemide 20 MG tablet Commonly known as:  DEMADEX Take 2 tablets (40 mg total) by mouth 2 (two) times daily.       Review of Systems  Constitutional: Negative for activity change, appetite change, chills, fatigue and fever.  HENT: Negative for congestion, rhinorrhea, sinus pain, sinus pressure, sneezing and sore throat.   Eyes: Negative.   Respiratory: Negative for cough, chest tightness, shortness of breath and wheezing.   Cardiovascular: Negative for chest pain, palpitations and leg swelling.  Gastrointestinal: Negative for abdominal distention, abdominal pain, constipation, diarrhea, nausea and vomiting.  Endocrine: Negative.   Genitourinary: Negative for dysuria, flank pain, frequency and urgency.  Musculoskeletal: Positive for gait problem.  Skin: Negative for color change, pallor and rash.  Neurological: Negative for dizziness, seizures, syncope, light-headedness and headaches.  Hematological: Does not bruise/bleed easily.  Psychiatric/Behavioral: Negative for agitation, confusion, hallucinations and sleep disturbance. The patient is not nervous/anxious.     Vitals:   06/21/16 1108  BP: 114/62  Pulse: 66  Resp: 16  Temp: 98.6 F (37 C)  TempSrc: Oral  SpO2: 96%  Weight: 217 lb 4.8 oz (98.6 kg)  Height: 5' 2"  (1.575 m)   Body mass index is 39.74 kg/m. Physical Exam  Constitutional: She is oriented to person, place, and time.  Obese elderly in no acute distress   HENT:  Head: Normocephalic.  Mouth/Throat: Oropharynx is clear and moist. No oropharyngeal exudate.  Eyes: Conjunctivae and EOM are normal. Pupils  are equal, round, and reactive to light. Right eye exhibits no discharge. Left eye exhibits no discharge. No scleral icterus.  Neck: Normal range of motion. No JVD present. No  thyromegaly present.  Cardiovascular: Regular rhythm and intact distal pulses.  Exam reveals no gallop and no friction rub.   Murmur heard. Pulmonary/Chest: Effort normal and breath sounds normal. No respiratory distress. She has no wheezes. She has no rales.  Oxygen 2 liters [place] in place during visit   Abdominal: Soft. Bowel sounds are normal. She exhibits no distension. There is no tenderness. There is no rebound and no guarding.  Musculoskeletal: She exhibits no tenderness or deformity.   Unsteady gait   Lymphadenopathy:    She has no cervical adenopathy.  Neurological: She is oriented to person, place, and time.  Skin: Skin is warm and dry. No rash noted. No erythema. No pallor.  Skin intact  Psychiatric: She has a normal mood and affect.    Labs reviewed: Basic Metabolic Panel:  Recent Labs  06/01/16 0500  06/23/16 0120  06/30/16 1736  07/17/16 0533 07/18/16 0419 07/19/16 0448  NA 136  < > 128*  < >  --   < > 129* 130* 129*  K 3.5  < > 3.6  < >  --   < > 3.6 4.6 4.7  CL 89*  < > 86*  < >  --   < > 87* 90* 89*  CO2 39*  < > 32  < >  --   < > 33* 34* 35*  GLUCOSE 242*  < > 378*  < >  --   < > 397* 186* 149*  BUN 11  < > 29*  < >  --   < > 32* 33* 21*  CREATININE 0.55  < > 1.30*  < >  --   < > 1.21* 0.96 0.74  CALCIUM 8.1*  < > 8.3*  < >  --   < > 8.3* 8.6* 9.0  MG 1.8  --  1.7  --  2.5*  --   --   --   --   PHOS 3.0  --  3.8  --   --   --   --   --   --   < > = values in this interval not displayed. Liver Function Tests:  Recent Labs  06/30/16 1720 07/07/16 1329 07/15/16 1139  AST 74* 65* 48*  ALT 53 34 31  ALKPHOS 156* 151* 126  BILITOT 1.4* 2.4* 2.7*  PROT 6.2* 6.8 6.1*  ALBUMIN 3.1* 3.2* 2.9*    Recent Labs  03/03/16 1318 03/11/16 1021  LIPASE 33 54.0    Recent Labs   06/22/16 2225  AMMONIA 19   CBC:  Recent Labs  07/02/16 0405  07/07/16 1329  07/15/16 1139  07/17/16 0533 07/18/16 0419 07/19/16 0448  WBC 9.0  < > 10.5  < > 18.0*  < > 13.4* 15.9* 15.2*  NEUTROABS 7.3*  --  9.1*  --  16.0*  --   --   --   --   HGB 8.5*  < > 9.9*  < > 7.7*  < > 9.0* 9.1* 9.4*  HCT 27.0*  < > 30.2*  < > 23.6*  < > 27.1* 27.2* 29.1*  MCV 95.7  < > 94.6  < > 97.1  < > 97.0 96.8 98.6  PLT 119*  < > 186  < > 187  < > 135* 132* 139*  < > = values in this interval not displayed. Cardiac Enzymes:  Recent Labs  07/07/16 2341 07/08/16 0502 07/08/16 1100  TROPONINI <0.03 0.03* <0.03    Recent Labs  07/18/16 2009 07/19/16 0748 07/19/16 1139  GLUCAP 219* 120* 166*    Assessment/Plan:   1. Unsteady gait  Has worked well with PT/ OT.Will discharge home PT/OT to continue with ROM, Exercise, Gait stability and muscle strengthening. No DME required has own walker at home. Fall and safety precautions.  2. Uncontrolled type 2 diabetes mellitus with diabetic neuropathy, with long-term current use of insulin  CBG's ranging in the 110's-upper 200's.Prednisone might be contributing to hyperglycemia. Will increase . Continue on Lantus to 24 units SQ at bedtime,Novolog 6 units three times with meals and Humalog per SSI. Continue to monitor CBG's ACHS.on ASA, ACE inhibitor and Statin. Annual eye and foot exam deferred to PCP. Monitor Hgb A1C.   3. Chronic systolic congestive heart failure Stable. Exam findings negative for wheezing, rales or shortness of breath.Continue on Torsemide 40 mg Daily and ramipril.continue on fluid restrictions 1.5 liters per day and monitor weight daily.   4. Benign hypertensive heart disease with CHF (congestive heart failure)  B/p stable.Continue on Ramipril 2.5 mg Tablet daily.On ASA for chest pain prophylaxis.Recheck BMP in 1-2 weeks with PCP.      5. Mixed hyperlipidemia Continue on lovastatin 20 mg Tablet at bedtime. monitor lipid panel  periodically.   6. Gastroesophageal reflux disease without esophagitis Stable.Continue on omeprazole.   7. Leukocytosis  Afebrile.WBC 16.3 ( 06/21/2016).recent CXR negative for acute abnormalities. Awaiting urine specimen U/A and C/S results.Patient and POA  insist on being discharge home states unable to pay for rehab stay since insurance will no longer cover her stay.Follow up with PCP. Recheck CBC/diff in 1-2 weeks. Awaiting urine specimen results will fax to PCP if abnormal results.   Patient is being discharged with the following home health services:   -PT/OT for ROM, exercise, gait stability and muscle strengthening  Patient is being discharged with the following durable medical equipment:   - none required   Patient has been advised to f/u with their PCP in 1-2 weeks to for a transitions of care visit.Social services at their facility was responsible for arranging this appointment.  Pt was provided with adequate prescriptions of noncontrolled medications to reach the scheduled appointment.For controlled substances, a limited supply was provided as appropriate for the individual patient. If the pt normally receives these medications from a pain clinic or has a contract with another physician, these medications should be received from that clinic or physician only).    Future labs/tests needed:  CBC/diff, BMP in 1-2 weeks PCP

## 2016-07-28 NOTE — Telephone Encounter (Signed)
noted 

## 2016-07-28 NOTE — Telephone Encounter (Signed)
[name] from [facility] called and stated that they received orders but pt has been admitted to hospice care.

## 2016-09-28 DEATH — deceased

## 2016-10-06 ENCOUNTER — Ambulatory Visit: Payer: Medicare Other | Admitting: Family

## 2017-03-15 ENCOUNTER — Ambulatory Visit: Payer: Medicare Other

## 2018-07-24 IMAGING — DX DG CHEST 1V PORT
1 series · 1 of 1 positions shown · non-contrast
Comparison: Chest radiograph performed 05/30/2016

CLINICAL DATA: Acute onset of generalized weakness. Low blood
pressure. Initial encounter.

EXAM:
PORTABLE CHEST 1 VIEW

[chest ap]
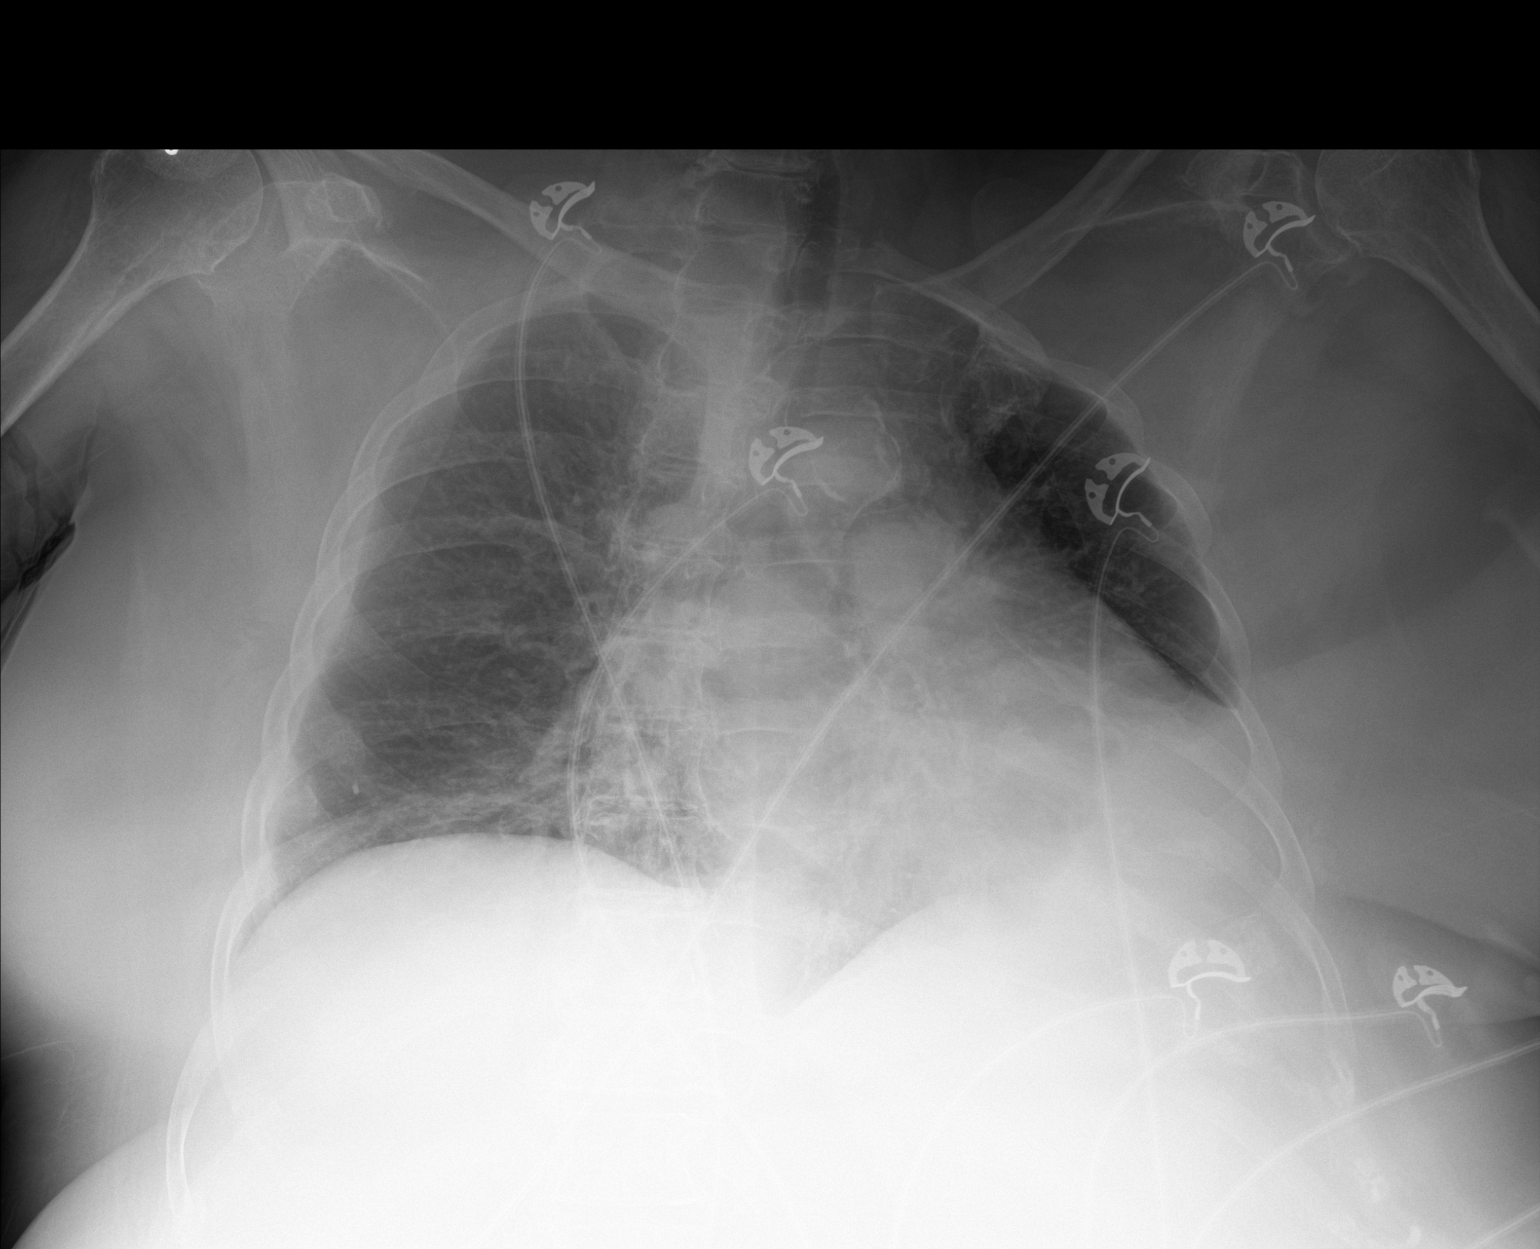

[1 of 1 positions shown; findings below may reference images not displayed]

FINDINGS: The lungs are mildly hypoexpanded. A small left pleural effusion is
noted. Left basilar airspace opacity raises concern for pneumonia.
Mild right basilar atelectasis is seen. No pneumothorax is seen.

The cardiomediastinal silhouette is within normal limits. No acute
osseous abnormalities are seen.
IMPRESSION: Lungs mildly hypoexpanded. Small left pleural effusion noted. Left
basilar airspace opacity raises concern for pneumonia. Mild right
basilar atelectasis seen.

Followup PA and lateral chest X-ray is recommended in 3-4 weeks
following trial of antibiotic therapy to ensure resolution and
exclude underlying malignancy.

## 2018-07-26 IMAGING — CR DG CHEST 2V
1 series · 2 of 2 positions shown · non-contrast
Comparison: 06/22/2016, 05/30/2016

CLINICAL DATA: Pneumonia, shortness of breath with weakness

EXAM:
CHEST  2 VIEW

[Series 1: dg chest 2 view · 0.14mm/px · 2 of 2 slices shown]
[im 1/2]
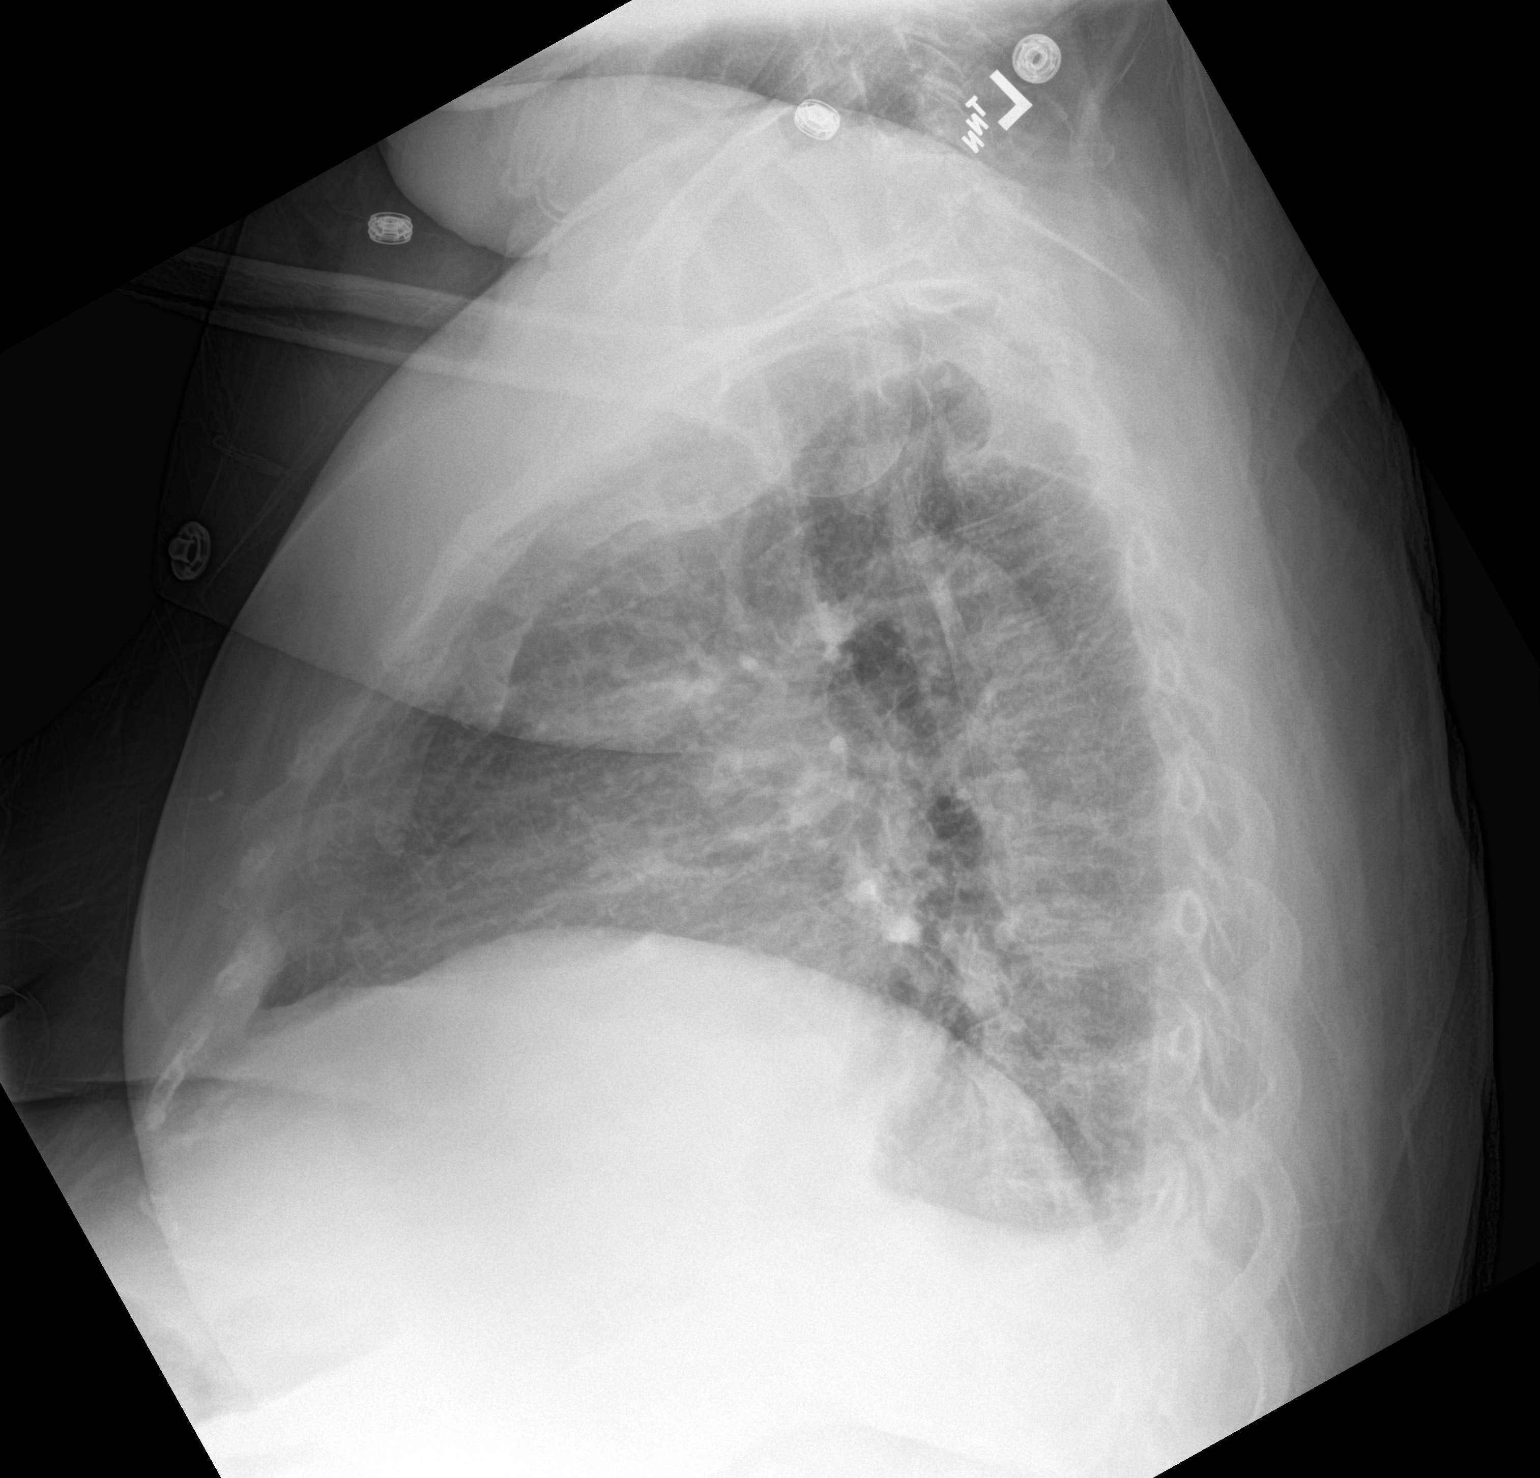
[im 2/2]
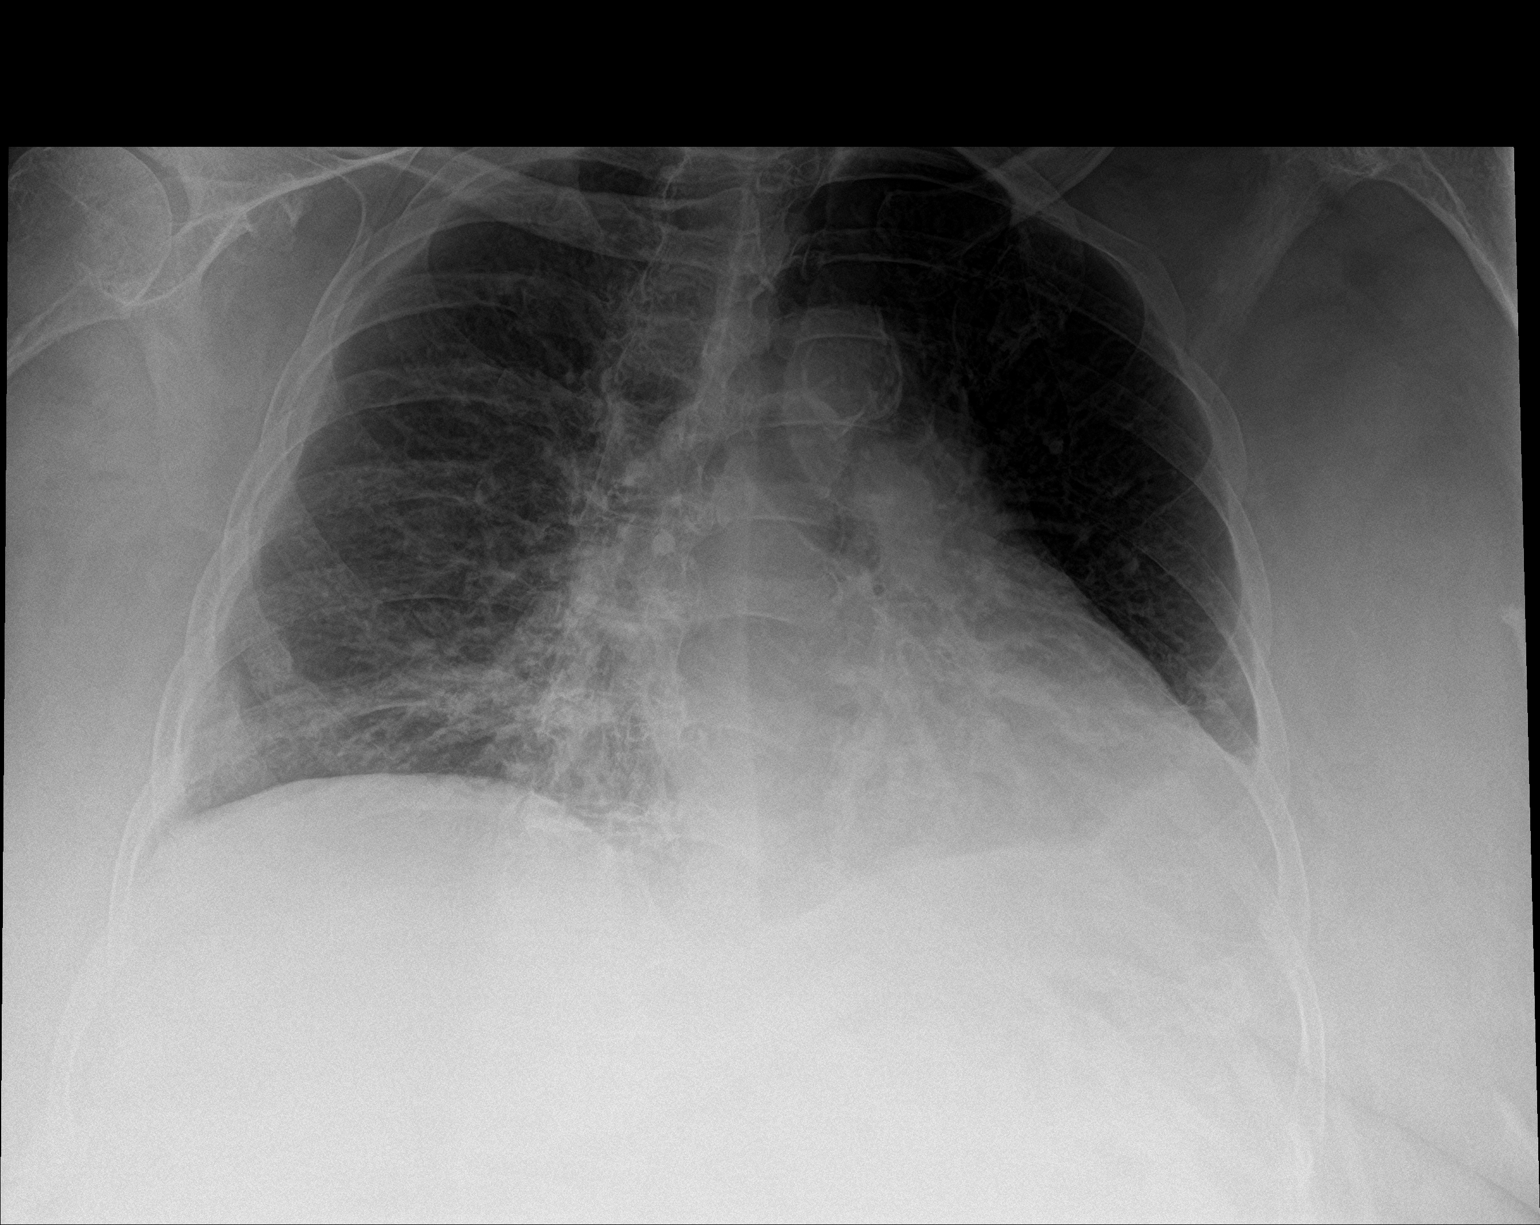

[2 of 2 positions shown; findings below may reference images not displayed]

FINDINGS: Cardiomegaly. Trace bilateral pleural effusions. Slightly improved
aeration of the left lung base. Streaky bibasilar opacities.
Cardiomegaly with central vascular congestion. Aortic
atherosclerosis. No pneumothorax.
IMPRESSION: 1. Tiny bilateral effusions. Overall improved aeration at the left
lung base
2. Streaky bibasilar opacities, favor atelectasis
3. Cardiomegaly with central vascular congestion

## 2018-08-01 IMAGING — CT CT ANGIO CHEST
2 of 6 series · 18 of 46 positions shown · IV contrast (APPLIED)
Comparison: Chest x-ray 06/30/2016, CT chest 12/21/2013

CLINICAL DATA: Pneumonia shortness of breath

EXAM:
CT ANGIOGRAPHY CHEST WITH CONTRAST
TECHNIQUE: Multidetector CT imaging of the chest was performed using the
standard protocol during bolus administration of intravenous
contrast. Multiplanar CT image reconstructions and MIPs were
obtained to evaluate the vascular anatomy.
CONTRAST:  75 mL Isovue 370 intravenous

[Series 5: thins · axial · 0.73mm/px · z∈[+52,+253]mm · 16 of 221 slices shown]
[im 10/221  lung]
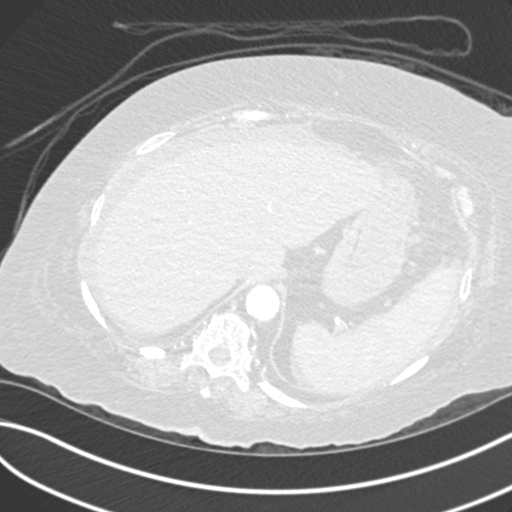
[im 29/221  soft-tissue]
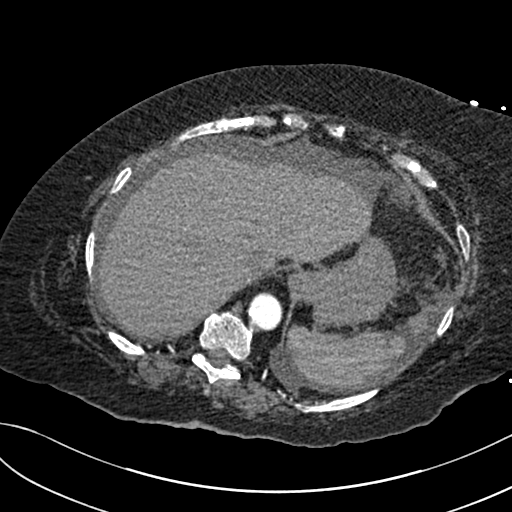
[im 39/221  lung]
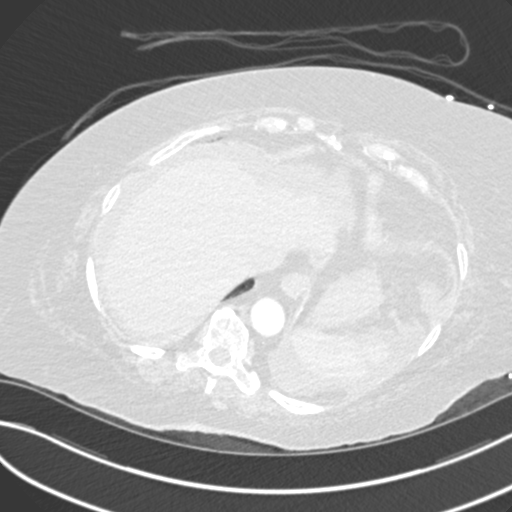
[im 48/221  soft-tissue]
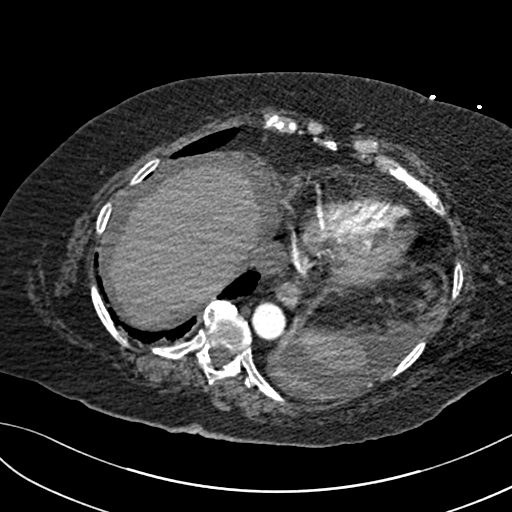
[im 67/221  lung]
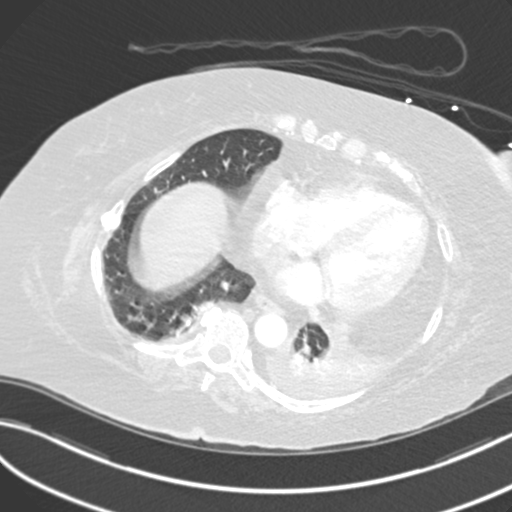
[im 77/221  soft-tissue]
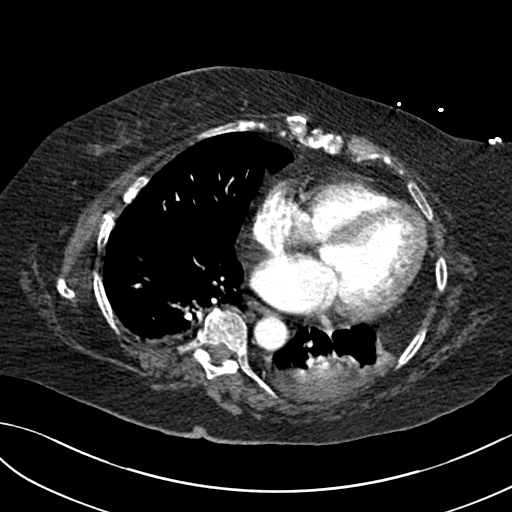
[im 87/221  lung]
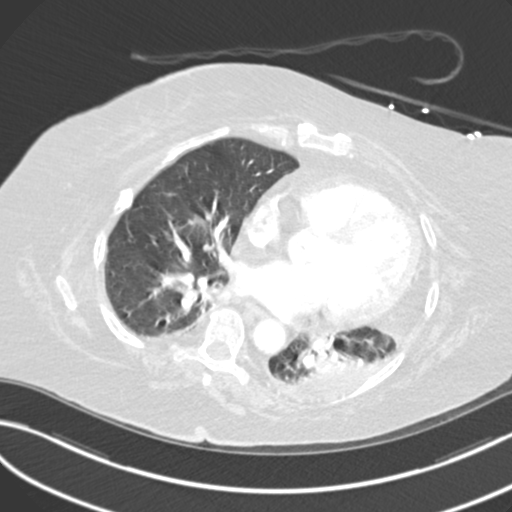
[im 106/221  soft-tissue]
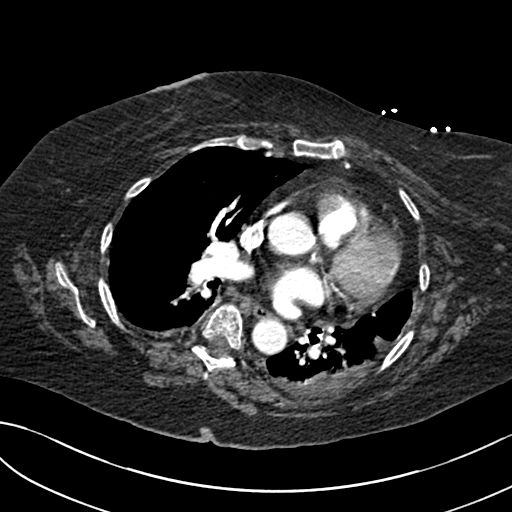
[im 115/221  lung]
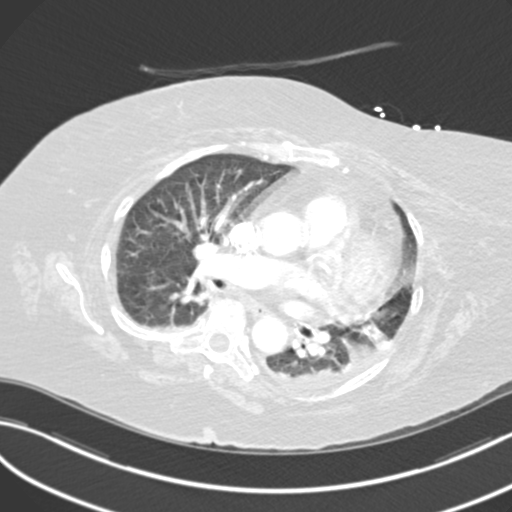
[im 134/221  soft-tissue]
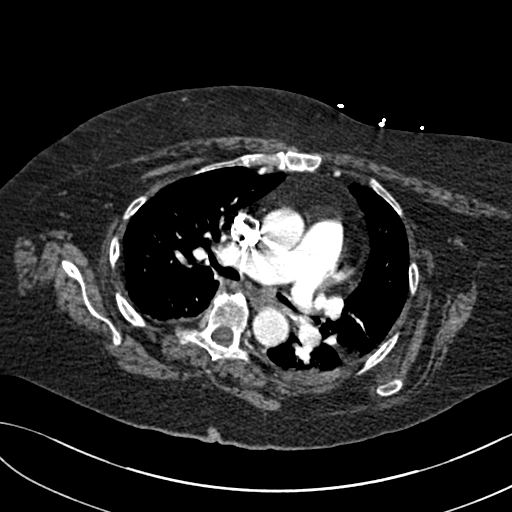
[im 144/221  lung]
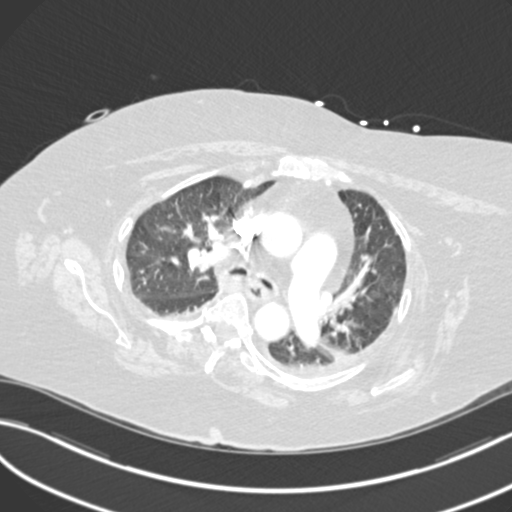
[im 154/221  soft-tissue]
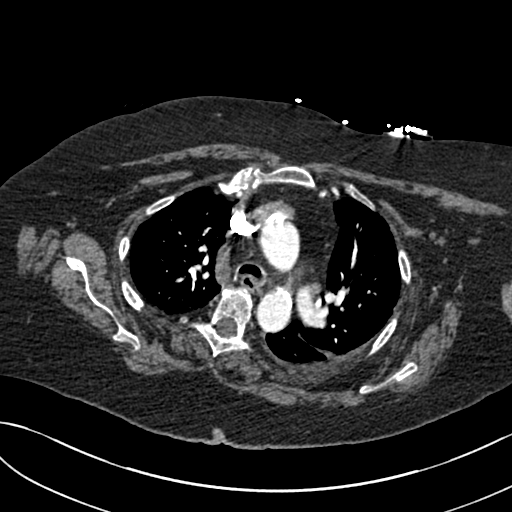
[im 173/221  lung]
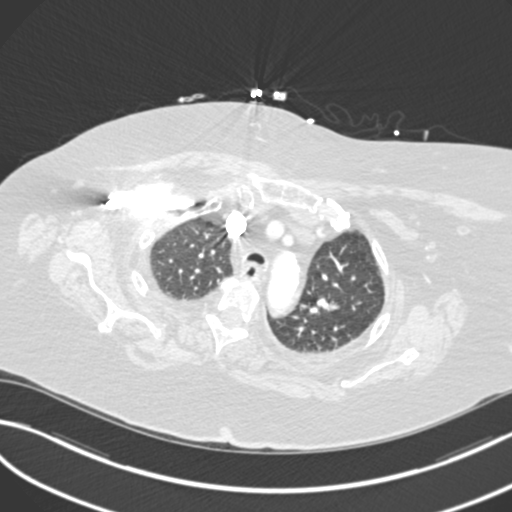
[im 182/221  soft-tissue]
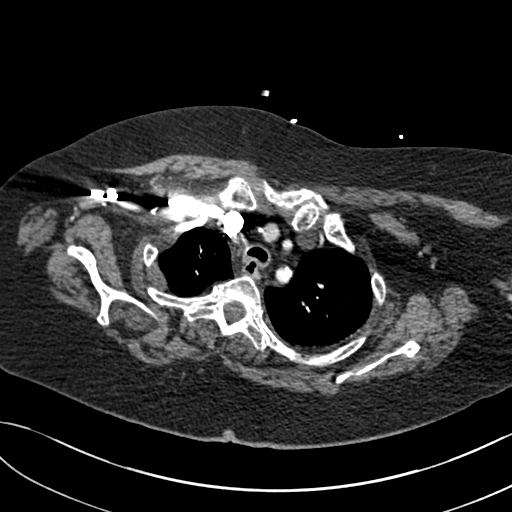
[im 192/221  lung]
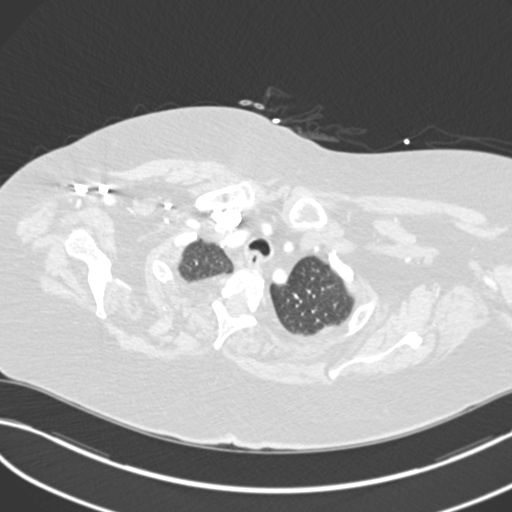
[im 211/221  soft-tissue]
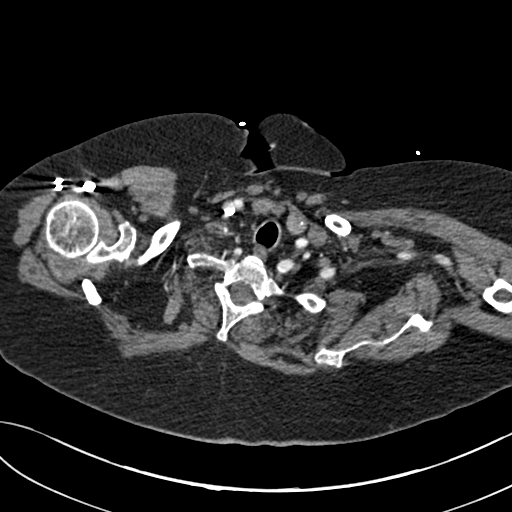

[Series 7: coronal mpr · coronal · 0.49mm/px · 2 of 88 slices shown]
[im 30/88  soft-tissue]
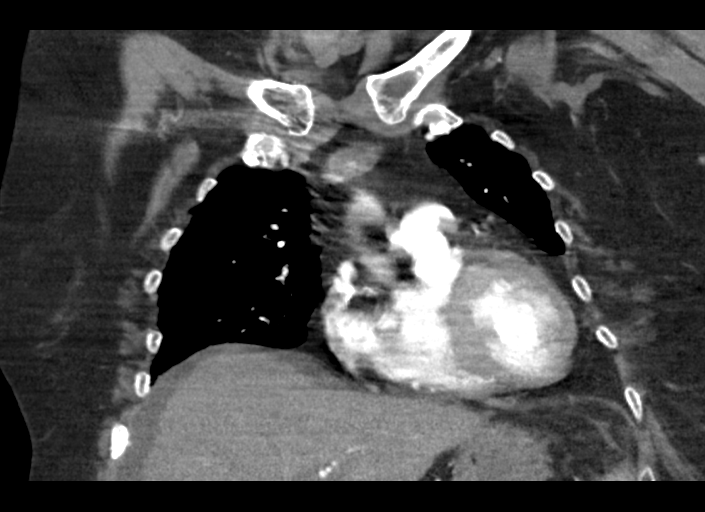
[im 59/88  soft-tissue]
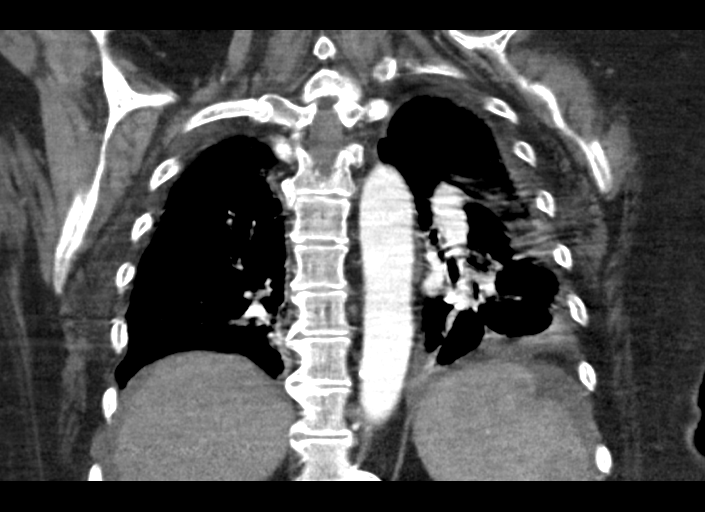

[18 of 46 positions shown; findings below may reference images not displayed]

FINDINGS: Cardiovascular: Respiratory motion artifact limits the exam. No
definite central filling defects are visualized. Non aneurysmal
aorta. Aortic atherosclerosis. No dissection. Coronary artery
calcification. Mild cardiomegaly. No large pericardial effusion.

Mediastinum/Nodes: Punctate calcifications in the left lobe of the
thyroid. Midline trachea. No significantly enlarged mediastinal
lymph nodes. Esophagus within normal limits.

Lungs/Pleura: Small left-sided pleural effusion with left lower lobe
partial atelectasis or pneumonia. No pneumothorax. Mild hazy
bilateral densities may reflect diffuse atelectasis or mild edema.

Upper Abdomen: Small amount of ascites around the liver and spleen.
Spleen upper normal at 14 cm.

Musculoskeletal: Degenerative changes. No suspicious bone lesion.
Multiple old right lower anterior rib fractures.

Review of the MIP images confirms the above findings.
IMPRESSION: 1. Respiratory motion artifact limits the exam. No definite acute
embolus visualized. Negative for aortic dissection
2. Small left pleural effusion with left lower lobe partial
atelectasis or pneumonia
3. Small amount of ascites around the liver and spleen.
